# Patient Record
Sex: Female | Born: 1965
Health system: Southern US, Community
[De-identification: ages and names within clinical notes are randomized; demographics above are authoritative.]

## PROBLEM LIST (undated history)

## (undated) DIAGNOSIS — R51 Headache: Secondary | ICD-10-CM

## (undated) DIAGNOSIS — F329 Major depressive disorder, single episode, unspecified: Secondary | ICD-10-CM

## (undated) DIAGNOSIS — K219 Gastro-esophageal reflux disease without esophagitis: Secondary | ICD-10-CM

## (undated) DIAGNOSIS — R269 Unspecified abnormalities of gait and mobility: Secondary | ICD-10-CM

## (undated) DIAGNOSIS — Z833 Family history of diabetes mellitus: Secondary | ICD-10-CM

## (undated) DIAGNOSIS — M21161 Varus deformity, not elsewhere classified, right knee: Secondary | ICD-10-CM

## (undated) DIAGNOSIS — Z96653 Presence of artificial knee joint, bilateral: Secondary | ICD-10-CM

## (undated) DIAGNOSIS — F419 Anxiety disorder, unspecified: Secondary | ICD-10-CM

## (undated) DIAGNOSIS — E785 Hyperlipidemia, unspecified: Secondary | ICD-10-CM

## (undated) DIAGNOSIS — M1712 Unilateral primary osteoarthritis, left knee: Secondary | ICD-10-CM

## (undated) DIAGNOSIS — R112 Nausea with vomiting, unspecified: Secondary | ICD-10-CM

## (undated) DIAGNOSIS — M1711 Unilateral primary osteoarthritis, right knee: Secondary | ICD-10-CM

## (undated) DIAGNOSIS — Z9889 Other specified postprocedural states: Secondary | ICD-10-CM

## (undated) DIAGNOSIS — M21162 Varus deformity, not elsewhere classified, left knee: Secondary | ICD-10-CM

## (undated) DIAGNOSIS — M1990 Unspecified osteoarthritis, unspecified site: Secondary | ICD-10-CM

## (undated) DIAGNOSIS — M174 Other bilateral secondary osteoarthritis of knee: Secondary | ICD-10-CM

## (undated) DIAGNOSIS — K30 Functional dyspepsia: Secondary | ICD-10-CM

## (undated) DIAGNOSIS — D490 Neoplasm of unspecified behavior of digestive system: Secondary | ICD-10-CM

## (undated) DIAGNOSIS — R001 Bradycardia, unspecified: Secondary | ICD-10-CM

## (undated) HISTORY — DX: Presence of artificial knee joint, bilateral: Z96.653

## (undated) HISTORY — DX: Unspecified osteoarthritis, unspecified site: M19.90

## (undated) HISTORY — DX: Unilateral primary osteoarthritis, left knee: M17.12

## (undated) HISTORY — DX: Headache: R51

## (undated) HISTORY — DX: Family history of diabetes mellitus: Z83.3

## (undated) HISTORY — DX: Varus deformity, not elsewhere classified, right knee: M21.161

## (undated) HISTORY — DX: Gastro-esophageal reflux disease without esophagitis: K21.9

## (undated) HISTORY — DX: Varus deformity, not elsewhere classified, left knee: M21.162

## (undated) HISTORY — DX: Bradycardia, unspecified: R00.1

## (undated) HISTORY — DX: Unspecified abnormalities of gait and mobility: R26.9

## (undated) HISTORY — DX: Neoplasm of unspecified behavior of digestive system: D49.0

## (undated) HISTORY — DX: Unilateral primary osteoarthritis, right knee: M17.11

## (undated) HISTORY — DX: Hyperlipidemia, unspecified: E78.5

## (undated) HISTORY — DX: Major depressive disorder, single episode, unspecified: F32.9

## (undated) HISTORY — DX: Other bilateral secondary osteoarthritis of knee: M17.4

## (undated) HISTORY — PX: ABDOMINAL HYSTERECTOMY: SHX81

## (undated) HISTORY — PX: APPENDECTOMY: SHX54

---

## 1997-04-19 DIAGNOSIS — Z9889 Other specified postprocedural states: Secondary | ICD-10-CM

## 1997-04-19 DIAGNOSIS — R112 Nausea with vomiting, unspecified: Secondary | ICD-10-CM

## 1997-04-19 HISTORY — DX: Other specified postprocedural states: Z98.890

## 1997-04-19 HISTORY — DX: Other specified postprocedural states: R11.2

## 1997-11-06 ENCOUNTER — Inpatient Hospital Stay (HOSPITAL_COMMUNITY): Admission: AD | Admit: 1997-11-06 | Discharge: 1997-11-06 | Payer: Self-pay | Admitting: Obstetrics

## 1997-11-08 ENCOUNTER — Inpatient Hospital Stay (HOSPITAL_COMMUNITY): Admission: AD | Admit: 1997-11-08 | Discharge: 1997-11-08 | Payer: Self-pay | Admitting: Obstetrics & Gynecology

## 1997-11-18 ENCOUNTER — Inpatient Hospital Stay (HOSPITAL_COMMUNITY): Admission: AD | Admit: 1997-11-18 | Discharge: 1997-11-18 | Payer: Self-pay | Admitting: *Deleted

## 1997-11-27 ENCOUNTER — Inpatient Hospital Stay (HOSPITAL_COMMUNITY): Admission: AD | Admit: 1997-11-27 | Discharge: 1997-11-27 | Payer: Self-pay | Admitting: Obstetrics

## 1997-12-03 ENCOUNTER — Inpatient Hospital Stay (HOSPITAL_COMMUNITY): Admission: AD | Admit: 1997-12-03 | Discharge: 1997-12-03 | Payer: Self-pay | Admitting: *Deleted

## 1997-12-06 ENCOUNTER — Inpatient Hospital Stay (HOSPITAL_COMMUNITY): Admission: AD | Admit: 1997-12-06 | Discharge: 1997-12-06 | Payer: Self-pay | Admitting: Obstetrics

## 1997-12-19 ENCOUNTER — Other Ambulatory Visit: Admission: RE | Admit: 1997-12-19 | Discharge: 1997-12-19 | Payer: Self-pay | Admitting: Obstetrics

## 1997-12-19 ENCOUNTER — Encounter: Admission: RE | Admit: 1997-12-19 | Discharge: 1997-12-19 | Payer: Self-pay | Admitting: Obstetrics

## 1997-12-28 ENCOUNTER — Emergency Department (HOSPITAL_COMMUNITY): Admission: EM | Admit: 1997-12-28 | Discharge: 1997-12-28 | Payer: Self-pay | Admitting: Emergency Medicine

## 1999-04-20 HISTORY — PX: TOTAL ABDOMINAL HYSTERECTOMY W/ BILATERAL SALPINGOOPHORECTOMY: SHX83

## 1999-05-29 ENCOUNTER — Other Ambulatory Visit: Admission: RE | Admit: 1999-05-29 | Discharge: 1999-05-29 | Payer: Self-pay | Admitting: Gynecology

## 1999-06-03 ENCOUNTER — Encounter (INDEPENDENT_AMBULATORY_CARE_PROVIDER_SITE_OTHER): Payer: Self-pay | Admitting: Specialist

## 1999-06-03 ENCOUNTER — Inpatient Hospital Stay (HOSPITAL_COMMUNITY): Admission: RE | Admit: 1999-06-03 | Discharge: 1999-06-05 | Payer: Self-pay | Admitting: Gynecology

## 2000-05-31 ENCOUNTER — Other Ambulatory Visit: Admission: RE | Admit: 2000-05-31 | Discharge: 2000-05-31 | Payer: Self-pay | Admitting: Gynecology

## 2001-06-06 ENCOUNTER — Other Ambulatory Visit: Admission: RE | Admit: 2001-06-06 | Discharge: 2001-06-06 | Payer: Self-pay | Admitting: Gynecology

## 2002-06-07 ENCOUNTER — Other Ambulatory Visit: Admission: RE | Admit: 2002-06-07 | Discharge: 2002-06-07 | Payer: Self-pay | Admitting: Gynecology

## 2003-06-10 ENCOUNTER — Other Ambulatory Visit: Admission: RE | Admit: 2003-06-10 | Discharge: 2003-06-10 | Payer: Self-pay | Admitting: Gynecology

## 2004-01-16 ENCOUNTER — Encounter: Admission: RE | Admit: 2004-01-16 | Discharge: 2004-01-16 | Payer: Self-pay | Admitting: Gynecology

## 2004-06-11 ENCOUNTER — Other Ambulatory Visit: Admission: RE | Admit: 2004-06-11 | Discharge: 2004-06-11 | Payer: Self-pay | Admitting: Gynecology

## 2004-10-03 ENCOUNTER — Emergency Department (HOSPITAL_COMMUNITY): Admission: EM | Admit: 2004-10-03 | Discharge: 2004-10-03 | Payer: Self-pay | Admitting: Emergency Medicine

## 2005-02-02 ENCOUNTER — Encounter: Admission: RE | Admit: 2005-02-02 | Discharge: 2005-02-02 | Payer: Self-pay | Admitting: Gynecology

## 2005-05-25 ENCOUNTER — Other Ambulatory Visit: Admission: RE | Admit: 2005-05-25 | Discharge: 2005-05-25 | Payer: Self-pay | Admitting: Gynecology

## 2006-02-10 ENCOUNTER — Encounter: Admission: RE | Admit: 2006-02-10 | Discharge: 2006-02-10 | Payer: Self-pay | Admitting: Gynecology

## 2006-05-20 ENCOUNTER — Other Ambulatory Visit: Admission: RE | Admit: 2006-05-20 | Discharge: 2006-05-20 | Payer: Self-pay | Admitting: Gynecology

## 2006-05-24 ENCOUNTER — Emergency Department (HOSPITAL_COMMUNITY): Admission: EM | Admit: 2006-05-24 | Discharge: 2006-05-24 | Payer: Self-pay | Admitting: Emergency Medicine

## 2006-05-25 ENCOUNTER — Emergency Department (HOSPITAL_COMMUNITY): Admission: EM | Admit: 2006-05-25 | Discharge: 2006-05-26 | Payer: Self-pay | Admitting: Emergency Medicine

## 2006-06-26 ENCOUNTER — Emergency Department (HOSPITAL_COMMUNITY): Admission: EM | Admit: 2006-06-26 | Discharge: 2006-06-26 | Payer: Self-pay | Admitting: Emergency Medicine

## 2006-07-26 ENCOUNTER — Ambulatory Visit: Payer: Self-pay | Admitting: Internal Medicine

## 2006-07-27 ENCOUNTER — Ambulatory Visit: Payer: Self-pay | Admitting: Internal Medicine

## 2006-09-23 ENCOUNTER — Emergency Department (HOSPITAL_COMMUNITY): Admission: EM | Admit: 2006-09-23 | Discharge: 2006-09-23 | Payer: Self-pay | Admitting: Family Medicine

## 2006-10-05 ENCOUNTER — Ambulatory Visit: Payer: Self-pay | Admitting: Internal Medicine

## 2006-10-19 ENCOUNTER — Emergency Department (HOSPITAL_COMMUNITY): Admission: EM | Admit: 2006-10-19 | Discharge: 2006-10-19 | Payer: Self-pay | Admitting: Emergency Medicine

## 2007-02-17 ENCOUNTER — Encounter: Admission: RE | Admit: 2007-02-17 | Discharge: 2007-02-17 | Payer: Self-pay | Admitting: Gynecology

## 2007-05-26 ENCOUNTER — Emergency Department (HOSPITAL_COMMUNITY): Admission: EM | Admit: 2007-05-26 | Discharge: 2007-05-26 | Payer: Self-pay | Admitting: Emergency Medicine

## 2007-08-14 DIAGNOSIS — F329 Major depressive disorder, single episode, unspecified: Secondary | ICD-10-CM

## 2007-08-14 DIAGNOSIS — F3289 Other specified depressive episodes: Secondary | ICD-10-CM

## 2007-08-14 DIAGNOSIS — K219 Gastro-esophageal reflux disease without esophagitis: Secondary | ICD-10-CM

## 2007-08-14 DIAGNOSIS — M1990 Unspecified osteoarthritis, unspecified site: Secondary | ICD-10-CM | POA: Insufficient documentation

## 2007-08-14 DIAGNOSIS — R51 Headache: Secondary | ICD-10-CM

## 2007-08-14 DIAGNOSIS — R519 Headache, unspecified: Secondary | ICD-10-CM | POA: Insufficient documentation

## 2007-08-14 DIAGNOSIS — E785 Hyperlipidemia, unspecified: Secondary | ICD-10-CM

## 2007-08-14 HISTORY — DX: Other specified depressive episodes: F32.89

## 2007-08-14 HISTORY — DX: Headache: R51

## 2007-08-14 HISTORY — DX: Unspecified osteoarthritis, unspecified site: M19.90

## 2007-08-14 HISTORY — DX: Gastro-esophageal reflux disease without esophagitis: K21.9

## 2007-08-14 HISTORY — DX: Major depressive disorder, single episode, unspecified: F32.9

## 2007-08-14 HISTORY — DX: Hyperlipidemia, unspecified: E78.5

## 2008-04-29 ENCOUNTER — Encounter: Admission: RE | Admit: 2008-04-29 | Discharge: 2008-04-29 | Payer: Self-pay | Admitting: Gynecology

## 2009-05-06 ENCOUNTER — Encounter: Admission: RE | Admit: 2009-05-06 | Discharge: 2009-05-06 | Payer: Self-pay | Admitting: Gynecology

## 2010-05-11 ENCOUNTER — Encounter
Admission: RE | Admit: 2010-05-11 | Discharge: 2010-05-11 | Payer: Self-pay | Source: Home / Self Care | Attending: Gynecology | Admitting: Gynecology

## 2010-09-01 NOTE — Assessment & Plan Note (Signed)
[facility]                         GASTROENTEROLOGY OFFICE NOTE   Tina Johnson, Tina Johnson                      MRN:          [id number]  DATE:10/05/2006                            DOB:          [birth year]    HISTORY:  The patient presents today for followup.  She is a 45 year old  who was evaluated on July 26, 2006, for reflux symptoms, abdominal  complaints, and Hemoccult-positive stool.  See that dictation for  details.  She subsequently underwent colonoscopy and upper endoscopy on  July 27, 2006.  Colonoscopy including intubation of the terminal ilium  was normal.  Upper endoscopy was normal except for the presence of a  small hiatal hernia.  She was treated with Prevacid and asked to follow  up at this time.  Since that time, the patient was prescribed Xanax and  fluoxetine by Dr. Mikeal Hawthorne.  She states that she is currently feeling well  with no symptoms.  No further problems with abdominal complaints or  reflux complaints.  She is accompanied by her friend Corrie Dandy who, again,  serves as an excellent interpreter.   PHYSICAL EXAMINATION:  GENERAL:  This is a well-appearing female in no  acute distress.  VITAL SIGNS:  Blood pressure 110/62.  Heart rate is 52 and regular.  Weight 109 pounds (decreased 4 pounds).  ABDOMEN:  Soft without tenderness, mass, or hernia.   IMPRESSION:  1. Gastroesophageal reflux disease.  Symptoms resolved on Prevacid.  2. Hemoccult-positive stool with negative GI workup as discussed.   RECOMMENDATIONS:  1. Continue Prevacid.  2. Reflux precautions.  3. Resume general medical care with Dr. Mikeal Hawthorne and gynecologic care      with Dr. Nicholas Lose.  GI follow up p.r.n.     Wilhemina Bonito. Marina Goodell, MD  Electronically Signed    JNP/MedQ  DD: 10/05/2006  DT: 10/05/2006  Job #: [id number]   cc:   Gretta Cool, M.D.  Lonia Blood, M.D.

## 2010-09-04 NOTE — Assessment & Plan Note (Signed)
[facility]                                 ON-CALL NOTE   Tina Johnson, Tina Johnson                      MRN:          [id number]  DATE:07/28/2006                            DOB:          07/10/1965    I was called by this patient's son, Elita Quick at approximately 8:45 on  07/28/06. The patient had a colonoscopy without any removal of polyps per  their history two days ago by Dr. Yancey Flemings. This afternoon, she  developed some mild abdominal discomfort in the infraumbilical area.  This is not associated with any bleeding, fever, nausea, or vomiting.  She has not yet had a bowel movement since her colonoscopy. She has been  passing flatus.   I advised the son to tell his mom to monitor the situation, if things  worsened, or if there is a fever, bleeding, or vomiting, I wanted him to  call me back immediately for further advice. I expect this to pass once  she starts moving her bowels.     Iva Boop, MD,FACG  Electronically Signed    CEG/MedQ  DD: 07/28/2006  DT: 07/29/2006  Job #: [id number]   cc:   Wilhemina Bonito. Marina Goodell, MD

## 2010-09-04 NOTE — Assessment & Plan Note (Signed)
[facility]                         GASTROENTEROLOGY OFFICE NOTE   MAKAIAH, TERWILLIGER                      MRN:          [id number]  DATE:07/26/2006                            DOB:          09/18/1965    REFERRING PHYSICIAN:  Gretta Cool, M.D.   REASON FOR CONSULTATION:  Reflux symptoms, abdominal complaints and  hemoccult positive stool.   HISTORY:  This is a 45 year old [place] female who is referred through  the courtesy of Dr. Nicholas Lose regarding the above listed issues. 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  Thereafter, developed heartburn and chest discomfort. Her problem  persisted. She has apparently been seen in the emergency room on several  occasions. She was initially treated with Prilosec without improvement.  Subsequently treated with Protonix, which helped significantly. She does  have some nausea. Her chest pain is controlled. She thinks the Protonix  has resulted in some loose stools. She has had 10-15 pound weight loss  over the past several months. She denies vomiting, melena or  hematochezia. The patient herself speaks no [place]. Her friend, Corrie Dandy,  serves as an excellent interpreter.   PAST MEDICAL HISTORY:  1. Hyperlipidemia.  2. Depression.  3. Chronic headaches.  4. Osteoarthritis.   PAST SURGICAL HISTORY:  Hysterectomy and oophorectomy.   ALLERGIES:  No known drug allergies.   CURRENT MEDICATIONS:  1. Testosterone cream.  2. Vivelle dot.  3. Estrace cream.  4. Protonix 40 mg daily.  5. Mylanta p.r.n.   FAMILY HISTORY:  No family history of gastrointestinal malignancy.   SOCIAL HISTORY:  The patient is married with two sons. She lives with  her husband, Lesly Rubenstein. She is a Futures trader. She does not smoke or  use alcohol.   REVIEW OF SYSTEMS:  Per Diagnostic Evaluation form.   PHYSICAL EXAMINATION:  Somewhat anxious, but otherwise pleasant and well-  appearing female in no acute distress. Blood pressure is 124/78, heart  rate 68 and regular. Weight is 113 pounds. She is 4 feet 11 inches in  height.  HEENT: Sclerae anicteric. Conjunctivae are pink. Oral mucosa intact. No  adenopathy.  LUNGS:  Are clear.  HEART: Is regular.  ABDOMEN: Soft without tenderness, mass or hernia. Good bowel sounds are  heard.  EXTREMITIES: Are without edema.   LABORATORY FINDINGS:  CBC, comprehensive metabolic panel and urinalysis  obtained June 26, 2006, were unremarkable. She also had normal blood  work in early February. She did have Hemoccult positive stool upon  testing at Dr. Johnn Hai office.   IMPRESSION:  1. Gastroesophageal reflux disease.  2. Hemoccult positive stool.  3. Weight loss.   RECOMMENDATIONS:  1. Continue daily proton pump inhibitor therapy.  2. Schedule colonoscopy and upper endoscopy to evaluate abdominal      complaints and Hemoccult positive stool as well as weight loss. The      nature of both procedures as well as the risks, benefits and      alternatives were discussed in detail. She understood  and agreed to      proceed.  3. Ongoing general and gynecologic care with Dr. Nicholas Lose.     Wilhemina Bonito. Marina Goodell, MD  Electronically Signed    JNP/MedQ  DD: 07/26/2006  DT: 07/26/2006  Job #: [id number]   cc:   Gretta Cool, M.D.

## 2011-01-08 LAB — RAPID STREP SCREEN (MED CTR MEBANE ONLY): Streptococcus, Group A Screen (Direct): NEGATIVE

## 2011-02-04 LAB — DIFFERENTIAL
Basophils Absolute: 0
Basophils Relative: 0
Eosinophils Absolute: 0
Eosinophils Relative: 0
Lymphocytes Relative: 17
Lymphs Abs: 1.1
Monocytes Absolute: 0.3
Monocytes Relative: 4
Neutro Abs: 5.1
Neutrophils Relative %: 78 — ABNORMAL HIGH

## 2011-02-04 LAB — CBC
HCT: 40.5
Hemoglobin: 14
MCHC: 34.5
MCV: 86.8
Platelets: 246
RBC: 4.66
RDW: 13.4
WBC: 6.5

## 2011-02-04 LAB — COMPREHENSIVE METABOLIC PANEL
ALT: 18
AST: 21
Albumin: 3.7
Alkaline Phosphatase: 67
BUN: 12
CO2: 28
Calcium: 9.1
Chloride: 105
Creatinine, Ser: 0.53
GFR calc Af Amer: >60
GFR calc non Af Amer: >60
Glucose, Bld: 107 — ABNORMAL HIGH
Potassium: 4.1
Sodium: 139
Total Bilirubin: 0.9
Total Protein: 6.9

## 2011-02-04 LAB — POCT CARDIAC MARKERS
CKMB, poc: 1.1
Myoglobin, poc: 47.6
Operator id: 2725
Troponin i, poc: <0.05

## 2011-04-26 ENCOUNTER — Other Ambulatory Visit: Payer: Self-pay | Admitting: Gynecology

## 2011-04-26 DIAGNOSIS — Z1231 Encounter for screening mammogram for malignant neoplasm of breast: Secondary | ICD-10-CM

## 2011-05-17 ENCOUNTER — Ambulatory Visit
Admission: RE | Admit: 2011-05-17 | Discharge: 2011-05-17 | Disposition: A | Discharge status: Home / Self Care | Payer: Self-pay | Source: Ambulatory Visit | Attending: Gynecology | Admitting: Gynecology

## 2011-05-17 DIAGNOSIS — Z1231 Encounter for screening mammogram for malignant neoplasm of breast: Secondary | ICD-10-CM

## 2012-04-24 ENCOUNTER — Other Ambulatory Visit: Payer: Self-pay | Admitting: Gynecology

## 2012-04-24 DIAGNOSIS — Z1231 Encounter for screening mammogram for malignant neoplasm of breast: Secondary | ICD-10-CM

## 2012-05-22 ENCOUNTER — Ambulatory Visit
Admission: RE | Admit: 2012-05-22 | Discharge: 2012-05-22 | Disposition: A | Discharge status: Home / Self Care | Payer: No Typology Code available for payment source | Source: Ambulatory Visit | Attending: Gynecology | Admitting: Gynecology

## 2012-05-22 DIAGNOSIS — Z1231 Encounter for screening mammogram for malignant neoplasm of breast: Secondary | ICD-10-CM

## 2013-06-06 ENCOUNTER — Other Ambulatory Visit: Payer: Self-pay

## 2013-06-06 DIAGNOSIS — Z1231 Encounter for screening mammogram for malignant neoplasm of breast: Secondary | ICD-10-CM

## 2013-06-21 ENCOUNTER — Ambulatory Visit
Admission: RE | Admit: 2013-06-21 | Discharge: 2013-06-21 | Disposition: A | Discharge status: Home / Self Care | Payer: Self-pay | Source: Ambulatory Visit

## 2013-06-21 DIAGNOSIS — Z1231 Encounter for screening mammogram for malignant neoplasm of breast: Secondary | ICD-10-CM

## 2014-01-23 ENCOUNTER — Ambulatory Visit: Payer: Self-pay | Attending: Internal Medicine | Admitting: Internal Medicine

## 2014-01-23 ENCOUNTER — Encounter: Payer: Self-pay | Admitting: Internal Medicine

## 2014-01-23 ENCOUNTER — Ambulatory Visit (HOSPITAL_COMMUNITY)
Admission: RE | Admit: 2014-01-23 | Discharge: 2014-01-23 | Disposition: A | Discharge status: Home / Self Care | Payer: No Typology Code available for payment source | Source: Ambulatory Visit | Attending: Internal Medicine | Admitting: Internal Medicine

## 2014-01-23 VITALS — BP 132/78 | HR 59 | Temp 97.8°F | Resp 17 | Wt 116.2 lb

## 2014-01-23 DIAGNOSIS — M25561 Pain in right knee: Secondary | ICD-10-CM | POA: Insufficient documentation

## 2014-01-23 DIAGNOSIS — M79604 Pain in right leg: Secondary | ICD-10-CM | POA: Insufficient documentation

## 2014-01-23 DIAGNOSIS — Z23 Encounter for immunization: Secondary | ICD-10-CM

## 2014-01-23 DIAGNOSIS — M1711 Unilateral primary osteoarthritis, right knee: Secondary | ICD-10-CM

## 2014-01-23 DIAGNOSIS — Z833 Family history of diabetes mellitus: Secondary | ICD-10-CM | POA: Insufficient documentation

## 2014-01-23 DIAGNOSIS — M25562 Pain in left knee: Secondary | ICD-10-CM | POA: Insufficient documentation

## 2014-01-23 DIAGNOSIS — Z139 Encounter for screening, unspecified: Secondary | ICD-10-CM

## 2014-01-23 DIAGNOSIS — E785 Hyperlipidemia, unspecified: Secondary | ICD-10-CM | POA: Insufficient documentation

## 2014-01-23 HISTORY — DX: Unilateral primary osteoarthritis, right knee: M17.11

## 2014-01-23 HISTORY — DX: Family history of diabetes mellitus: Z83.3

## 2014-01-23 MED ORDER — UNABLE TO FIND: Status: DC

## 2014-01-23 MED ORDER — NAPROXEN 500 MG PO TABS: 500.0000 mg | ORAL_TABLET | Freq: Two times a day (BID) | ORAL | Status: DC

## 2014-01-23 NOTE — Progress Notes (Signed)
Patient here to establish care Complains of right leg pain and bilateral knee pain

## 2014-01-23 NOTE — Progress Notes (Signed)
Patient Demographics  Tina Johnson, is a 48 y.o. female  JSH:[id number]  IFO:[id number]  DOB - 12-31-1965  CC:  Chief Complaint  Patient presents with  . Establish Care       HPI: Tina Johnson is a 49 y.o. female here today to establish medical care. Patient has history of hyperlipidemia, GERD, hysterectomy with oophorectomy patient is currently following up with her gynecologist and is taking estradiol and Premarin, also noticed prescription for testosterone 4% as per patient she was prescribed in the past but does not use currently. Patient is complaining of right knee pain on and off for several months denies any fall or trauma. Patient has No headache, No chest pain, No abdominal pain - No Nausea, No new weakness tingling or numbness, No Cough - SOB.  No Known Allergies No past medical history on file. No current outpatient prescriptions on file prior to visit.   No current facility-administered medications on file prior to visit.   Family History  Problem Relation Age of Onset  . Diabetes Sister   . Diabetes Brother    History   Social History  . Marital Status: Married    Spouse Name: N/A    Number of Children: N/A  . Years of Education: N/A   Occupational History  . Not on file.   Social History Main Topics  . Smoking status: Never Smoker   . Smokeless tobacco: Not on file  . Alcohol Use: No  . Drug Use: Not on file  . Sexual Activity: Not on file   Other Topics Concern  . Not on file   Social History Narrative  . No narrative on file    Review of Systems: Constitutional: Negative for fever, chills, diaphoresis, activity change, appetite change and fatigue. HENT: Negative for ear pain, nosebleeds, congestion, facial swelling, rhinorrhea, neck pain, neck stiffness and ear discharge.  Eyes: Negative for pain, discharge, redness, itching and visual disturbance. Respiratory: Negative for cough, choking, chest tightness, shortness  of breath, wheezing and stridor.  Cardiovascular: Negative for chest pain, palpitations and leg swelling. Gastrointestinal: Negative for abdominal distention. Genitourinary: Negative for dysuria, urgency, frequency, hematuria, flank pain, decreased urine volume, difficulty urinating and dyspareunia.  Musculoskeletal: Negative for back pain, joint swelling, arthralgia and gait problem. Neurological: Negative for dizziness, tremors, seizures, syncope, facial asymmetry, speech difficulty, weakness, light-headedness, numbness and headaches.  Hematological: Negative for adenopathy. Does not bruise/bleed easily. Psychiatric/Behavioral: Negative for hallucinations, behavioral problems, confusion, dysphoric mood, decreased concentration and agitation.    Objective:   Filed Vitals:   01/23/14 1522  BP: 132/78  Pulse: 59  Temp: 97.8 F (36.6 C)  Resp: 17    Physical Exam: Constitutional: Patient appears well-developed and well-nourished. No distress. HENT: Normocephalic, atraumatic, External right and left ear normal. Oropharynx is clear and moist.  Eyes: Conjunctivae and EOM are normal. PERRLA, no scleral icterus. Neck: Normal ROM. Neck supple. No JVD. No tracheal deviation. No thyromegaly. CVS: RRR, S1/S2 +, no murmurs, no gallops, no carotid bruit.  Pulmonary: Effort and breath sounds normal, no stridor, rhonchi, wheezes, rales.  Abdominal: Soft. BS +, no distension, tenderness, rebound or guarding.  Musculoskeletal: Normal range of motion. No edema and right knee minimal  Tenderness, crepitation+  Neuro: Alert. Normal reflexes, muscle tone coordination. No cranial nerve deficit. Skin: Skin is warm and dry. No rash noted. Not diaphoretic. No erythema. No pallor. Psychiatric: Normal mood and affect. Behavior, judgment, thought content normal.  Lab Results  Component Value Date  WBC 6.5 09/23/2006   HGB 14.0 09/23/2006   HCT 40.5 09/23/2006   MCV 86.8 09/23/2006   PLT 246 09/23/2006   Lab  Results  Component Value Date   CREATININE 0.53 09/23/2006   BUN 12 09/23/2006   NA 139 09/23/2006   K 4.1 09/23/2006   CL 105 09/23/2006   CO2 28 09/23/2006    No results found for this basename: HGBA1C   Lipid Panel  No results found for this basename: chol,  trig,  hdl,  cholhdl,  vldl,  ldlcalc       Assessment and plan:   1. Hyperlipidemia And had a prescription for Zocor but as per patient she is not taking currently. - Lipid panel; Future  2. Right knee pain  - DG Knee Complete 4 Views Right; Future - naproxen (NAPROSYN) 500 MG tablet; Take 1 tablet (500 mg total) by mouth 2 (two) times daily with a meal.  Dispense: 30 tablet; Refill: 2  3. Needs flu shot Future given today  4. Family history of diabetes mellitus (DM) Will check HbA1c  5. Screening Ordered baseline fasting blood work. - CBC with Differential; Future - COMPLETE METABOLIC PANEL WITH GFR; Future - TSH; Future - Vit D  25 hydroxy (rtn osteoporosis monitoring); Future      Health Maintenance -Colonoscopy: uptodate  -Pap Smear: uptodate  -Mammogram: uptopdate  -Vaccinations:    -Influenza shot today    Return in about 3 months (around 04/25/2014) for hyperipidemia.   Lorayne Marek, MD

## 2014-01-29 ENCOUNTER — Ambulatory Visit: Payer: Self-pay | Attending: Internal Medicine

## 2014-01-29 DIAGNOSIS — Z139 Encounter for screening, unspecified: Secondary | ICD-10-CM

## 2014-01-29 DIAGNOSIS — M1990 Unspecified osteoarthritis, unspecified site: Secondary | ICD-10-CM

## 2014-01-29 DIAGNOSIS — E785 Hyperlipidemia, unspecified: Secondary | ICD-10-CM

## 2014-01-29 DIAGNOSIS — Z833 Family history of diabetes mellitus: Secondary | ICD-10-CM

## 2014-01-29 LAB — COMPLETE METABOLIC PANEL WITH GFR
ALT: 18 U/L (ref 0–35)
AST: 19 U/L (ref 0–37)
Albumin: 3.8 g/dL (ref 3.5–5.2)
Alkaline Phosphatase: 55 U/L (ref 39–117)
BUN: 19 mg/dL (ref 6–23)
CO2: 23 meq/L (ref 19–32)
CREATININE: 0.55 mg/dL (ref 0.50–1.10)
Calcium: 9.1 mg/dL (ref 8.4–10.5)
Chloride: 105 mEq/L (ref 96–112)
GFR, Est African American: >89 mL/min
GFR, Est Non African American: >89 mL/min
Glucose, Bld: 91 mg/dL (ref 70–99)
Potassium: 4.2 mEq/L (ref 3.5–5.3)
SODIUM: 139 meq/L (ref 135–145)
TOTAL PROTEIN: 6.5 g/dL (ref 6.0–8.3)
Total Bilirubin: 0.5 mg/dL (ref 0.2–1.2)

## 2014-01-29 LAB — CBC WITH DIFFERENTIAL/PLATELET
Basophils Absolute: 0 10*3/uL (ref 0.0–0.1)
Basophils Relative: 0 % (ref 0–1)
Eosinophils Absolute: 0.1 10*3/uL (ref 0.0–0.7)
Eosinophils Relative: 2 % (ref 0–5)
HCT: 38.5 % (ref 36.0–46.0)
Hemoglobin: 13.1 g/dL (ref 12.0–15.0)
LYMPHS ABS: 2.2 10*3/uL (ref 0.7–4.0)
Lymphocytes Relative: 36 % (ref 12–46)
MCH: 30.7 pg (ref 26.0–34.0)
MCHC: 34 g/dL (ref 30.0–36.0)
MCV: 90.2 fL (ref 78.0–100.0)
Monocytes Absolute: 0.5 10*3/uL (ref 0.1–1.0)
Monocytes Relative: 8 % (ref 3–12)
NEUTROS PCT: 54 % (ref 43–77)
Neutro Abs: 3.3 10*3/uL (ref 1.7–7.7)
Platelets: 252 10*3/uL (ref 150–400)
RBC: 4.27 MIL/uL (ref 3.87–5.11)
RDW: 14.2 % (ref 11.5–15.5)
WBC: 6.1 10*3/uL (ref 4.0–10.5)

## 2014-01-29 LAB — HEMOGLOBIN A1C
HEMOGLOBIN A1C: 5.3 % (ref <5.7)
Mean Plasma Glucose: 105 mg/dL (ref <117)

## 2014-01-29 LAB — LIPID PANEL
CHOL/HDL RATIO: 3.3 ratio
Cholesterol: 144 mg/dL (ref 0–200)
HDL: 43 mg/dL (ref >39)
LDL CALC: 84 mg/dL (ref 0–99)
Triglycerides: 86 mg/dL (ref <150)
VLDL: 17 mg/dL (ref 0–40)

## 2014-01-29 LAB — TSH: TSH: 4.001 u[IU]/mL (ref 0.350–4.500)

## 2014-01-29 NOTE — Progress Notes (Unsigned)
Patient here for fasting bloodwork

## 2014-01-30 LAB — VITAMIN D 25 HYDROXY (VIT D DEFICIENCY, FRACTURES): Vit D, 25-Hydroxy: 56 ng/mL (ref 30–89)

## 2014-02-04 ENCOUNTER — Telehealth: Payer: Self-pay | Admitting: *Deleted

## 2014-02-04 NOTE — Telephone Encounter (Signed)
Pt aware of lab results 

## 2014-02-04 NOTE — Telephone Encounter (Signed)
Message copied by Betti Cruz on Mon Feb 04, 2014  2:13 PM ------      Message from: Lorayne Marek      Created: Mon Feb 04, 2014  2:08 PM       Call and let the Patient know that blood work is normal.       ------

## 2014-02-14 ENCOUNTER — Ambulatory Visit (INDEPENDENT_AMBULATORY_CARE_PROVIDER_SITE_OTHER): Payer: Self-pay | Admitting: Sports Medicine

## 2014-02-14 ENCOUNTER — Encounter: Payer: Self-pay | Admitting: Sports Medicine

## 2014-02-14 VITALS — BP 147/78 | Ht <= 58 in | Wt 114.0 lb

## 2014-02-14 DIAGNOSIS — M1711 Unilateral primary osteoarthritis, right knee: Secondary | ICD-10-CM

## 2014-02-14 MED ORDER — METHYLPREDNISOLONE ACETATE 40 MG/ML IJ SUSP
40.0000 mg | Freq: Once | INTRAMUSCULAR | Status: AC
Start: 2014-02-14 — End: 2014-02-14
  Administered 2014-02-14: 40 mg via INTRA_ARTICULAR

## 2014-02-14 NOTE — Progress Notes (Addendum)
  Tina Johnson - 48 y.o. female MRN [id number]  Date of birth: 1966/01/13  SUBJECTIVE:  Including CC & ROS.  Patient is a 48 year old female who presents today complaining of right knee pain that has been on and off for 2 years but worse in the last 6 months. She describes intermittent sharp pain with a constant dull ache, mild intermittent swelling, and giving out. Denies any numbness or tingling in her lower extremity. Reports activity of walking and standing for long periods of time exacerbate her symptoms. She also points out that she has bowing in her legs that she feels is contributing to her knee pain. Patient has been on naproxen 500 mg twice a day for pain control with little relief.   ROS: Review of systems otherwise negative except for information present in HPI  HISTORY: Past Medical, Surgical, Social, and Family History Reviewed & Updated per EMR. Pertinent Historical Findings include: Hyperlipidemia Nonsmoker  DATA REVIEWED: Personally reviewed patient's 4 view right knee x-rays from earlier this month that showed moderate tricompartment osteoarthritis with moderate to severe medial jointline narrowing, and severe arthritis to the patellofemoral joint  PHYSICAL EXAM:  VS: BP:147/78 mmHg  HR: bpm  TEMP: ( )  RESP:   HT:4' 6.5" (138.4 cm)   WT:114 lb (51.71 kg)  BMI:27 KNEE EXAM:  General: well nourished Skin of LE: warm; dry, no rashes, lesions, ecchymosis or erythema. Vascular: Dorsal pedal pulses 2+ bilaterally Neurologically: Sensation to light touch lower extremities equal and intact bilaterally.  Observation:  no previous surgical scars, patella in place, moderate quad muscle atrophy Bowing of the tibias causing significant valgus stress Palpation:  Right knee - Mild effusion which is likely chronic, medial and lateral jointline narrowing and tenderness. Range of motion:  Full ROM at the hip. Range of motion significantly limited bilaterally between 0 of  extension and only about 70 of flexion Ligamentous testing: ligamentous stablity of ACL, MCL, LCL, and PCL. With Lachman's test, anterior drawer, posterior drawer, valgus and varus stress test Negative patella apprehension test Meniscal evaluation: Equivocal McMurray's test, Normal gait   ASSESSMENT & PLAN: See problem based charting & AVS for pt instructions.  Injection procedure: Consent obtained and verified. Sterile betadine prep. Furthur cleansed with alcohol. Topical analgesic spray: Ethyl chloride. Injection Indication: Right knee OA Approached in typical fashion with: lateral  Completed without difficulty Meds: 40mg  DepoMedrol and 3 cc lidocaine Needle: 22g 1.5in Aftercare instructions and Red flags advised. Advised to call if fevers/chills, erythema, induration, drainage, or persistent bleeding.

## 2014-02-14 NOTE — Assessment & Plan Note (Signed)
Based on patient's history, exam with lack of range of motion/joint line tenderness, and x-rays that showed tricompartment arthritis suspect majority of her pain and stiffness secondary to primary OA  Recommendations: -Advised patient that we can treat her primary arthritis with continuing oral medications versus a intra-articular steroid injection. Patient was agreeable with the injection. Thighs her we can do these every 3-6 months to maintain control of her pain. I do not suspect this will improve much of her stiffness and lack of range of motion -Given patient's severe knee valgus with bowing of the tibia I adjusted her feet with lateral wedges in both sports insoles for her shoes -Provided patient with advice on certain activities that she can tolerate in the gym including elliptical, bike, and walking in the pool. Avoiding deep squats or lunges Patient will follow-up as needed for further management based on her clinical response to steroid injection and modified occasions and activity

## 2014-02-14 NOTE — Progress Notes (Signed)
Alis herrera is the interpretor for today

## 2014-03-07 ENCOUNTER — Encounter: Payer: Self-pay | Admitting: Sports Medicine

## 2014-03-07 ENCOUNTER — Ambulatory Visit (INDEPENDENT_AMBULATORY_CARE_PROVIDER_SITE_OTHER): Payer: Self-pay | Admitting: Sports Medicine

## 2014-03-07 VITALS — BP 129/77 | HR 59 | Ht <= 58 in | Wt 115.0 lb

## 2014-03-07 DIAGNOSIS — M21162 Varus deformity, not elsewhere classified, left knee: Secondary | ICD-10-CM

## 2014-03-07 DIAGNOSIS — M1712 Unilateral primary osteoarthritis, left knee: Secondary | ICD-10-CM

## 2014-03-07 DIAGNOSIS — M21161 Varus deformity, not elsewhere classified, right knee: Secondary | ICD-10-CM

## 2014-03-07 HISTORY — DX: Varus deformity, not elsewhere classified, right knee: M21.161

## 2014-03-07 HISTORY — DX: Varus deformity, not elsewhere classified, left knee: M21.162

## 2014-03-07 HISTORY — DX: Unilateral primary osteoarthritis, left knee: M17.12

## 2014-03-07 MED ORDER — METHYLPREDNISOLONE ACETATE 40 MG/ML IJ SUSP
40.0000 mg | Freq: Once | INTRAMUSCULAR | Status: AC
Start: 2014-03-07 — End: 2014-03-07
  Administered 2014-03-07: 40 mg via INTRA_ARTICULAR

## 2014-03-07 NOTE — Assessment & Plan Note (Addendum)
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  Recommendations: -Advised patient that we can treat her primary arthritis with continuing oral medications versus a intra-articular steroid injection. Patient was agreeable with the injection. Thighs her we can do these every 3-6 months to maintain control of her pain. 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 Patient will follow-up in 2 weeks for custom orthotics -Provided patient with advice on certain activities that she can tolerate in the gym including elliptical, bike, and walking in the pool. Avoiding deep squats or lunges

## 2014-03-07 NOTE — Progress Notes (Signed)
Tina Johnson - 48 y.o. female MRN [id number]  Date of birth: Sep 18, 1965  SUBJECTIVE:  Including CC & ROS.  Patient is a 48 year old female who presents today complaining of bilateral knee pain.  Right knee pain: Patient was seen approximately 3 weeks ago were right knee pain. 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 We discussed options for treatment including oral medications, intra-articular steroid injection and bracing. Patient was interested in the injection reports that she did have clinical improvement from this intervention. She's been working on strengthening at [employer] which is also helped. Also at her last visit I placed the patient in a sports insoles with a medial wedge to correct some of the supination and her ankle related to the significant varus stress in her knees which is also helped some.  Left knee pain: Patient reports that she also has left knee pain that is similar to the right with intermittent sharp pains but mostly a dull ache. Intermittent mild swelling as well. Sometimes giving way but never locking or catching. Denies any numbness or tingling. She continues to take naproxen 500 mg twice a day for pain relief for both knees which is helpful. Has been working on strengthening at [employer]. Patient is interested in possibly getting a steroid injection in her left knee as well. She is also curious of what she can do to help further correct the alignment in her knees.   ROS: Review of systems otherwise negative except for information present in HPI  HISTORY: Past Medical, Surgical, Social, and Family History Reviewed & Updated per EMR. Pertinent Historical Findings include: Hyperlipidemia Nonsmoker  DATA REVIEWED: Personally reviewed patient's 4 view right knee x-rays from earlier this month that showed moderate tricompartment osteoarthritis with moderate to severe medial jointline narrowing,  and severe arthritis to the patellofemoral joint  PHYSICAL EXAM:  VS: BP:129/77 mmHg  HR:(!) 59bpm  TEMP: ( )  RESP:   HT:4\' 6"  (137.2 cm)   WT:115 lb (52.164 kg)  BMI:27.8 Bilateral KNEE EXAM:  General: well nourished Skin of LE: warm; dry, no rashes, lesions, ecchymosis or erythema. Vascular: Dorsal pedal pulses 2+ bilaterally Neurologically: Sensation to light touch lower extremities equal and intact bilaterally.  Observation:  no previous surgical scars, patella in place, moderate quad muscle atrophy Bowing of the tibias causing significant valgus stress Palpation:  Right knee - Mild effusion which is likely chronic, medial and lateral jointline narrowing and tenderness. Left knee - Mild effusion which is likely chronic, medial > lateral jointline narrowing and tenderness. Range of motion:  Full ROM at the hip. Range of motion significantly limited bilaterally between 0 of extension and only about 70 of flexion Ligamentous testing: ligamentous stablity of ACL, MCL, LCL, and PCL. With Lachman's test, anterior drawer, posterior drawer, valgus and varus stress test Negative patella apprehension test Meniscal evaluation: Equivocal McMurray's test, positive Thessaly's bilaterally antalgic gait secondary to severe genuine varum of the knees  FOOT EXAM:  General: well nourished Skin of LE: warm; dry, no rashes, lesions, ecchymosis or erythema. Vascular: Dorsal pedal pulses 2+ bilaterally Neurologically: Sensation to light touch lower extremities equal and intact bilaterally.  Observation - no ecchymosis, no edema, or hematoma present  Normal ankle motion and strength bilaterally  Extension/flexion 5/5 strength bilaterally in toes Weight-bearing foot exam:  First ray: Neutral  Second ray: Neutral Longitudinal arch: Mild collapse Heel position: Valgus Gait analysis:  Striking location: Heel Foot motion: Supination Knee motion:  Severe genuine varum Hip motion: External  rotation  ASSESSMENT & PLAN: See problem based charting & AVS for pt instructions.  Injection procedure: Consent obtained and verified. Sterile betadine prep. Furthur cleansed with alcohol. Topical analgesic spray: Ethyl chloride. Injection Indication: Left knee OA Approached in typical fashion with: lateral using ultrasound guidance to confirm placement Completed without difficulty Meds: 40mg  DepoMedrol and 3 cc lidocaine Needle: 22g 1.5in Aftercare instructions and Red flags advised. Advised to call if fevers/chills, erythema, induration, drainage, or persistent bleeding.    We spent greater than 50% of our 30 minute office visit in counseling education regarding the patient current clinical problem, risks and benefits of treatment options, and recommend plans for treatment or further evaluation

## 2014-03-07 NOTE — Patient Instructions (Addendum)
Interpreter is Delila Spence  It was great to see you today! Call the office if you have any questions [phone]   F/u in 2 week for custom orthotics

## 2014-03-21 ENCOUNTER — Encounter: Payer: No Typology Code available for payment source | Admitting: Sports Medicine

## 2014-04-19 HISTORY — PX: JOINT REPLACEMENT: SHX530

## 2014-04-25 ENCOUNTER — Encounter: Payer: Self-pay | Admitting: Sports Medicine

## 2014-04-25 ENCOUNTER — Ambulatory Visit (INDEPENDENT_AMBULATORY_CARE_PROVIDER_SITE_OTHER): Payer: Self-pay | Admitting: Sports Medicine

## 2014-04-25 ENCOUNTER — Encounter: Payer: No Typology Code available for payment source | Admitting: Sports Medicine

## 2014-04-25 VITALS — BP 150/81 | HR 49 | Ht <= 58 in | Wt 115.0 lb

## 2014-04-25 DIAGNOSIS — R269 Unspecified abnormalities of gait and mobility: Secondary | ICD-10-CM

## 2014-04-25 DIAGNOSIS — M1711 Unilateral primary osteoarthritis, right knee: Secondary | ICD-10-CM

## 2014-04-25 DIAGNOSIS — M21162 Varus deformity, not elsewhere classified, left knee: Secondary | ICD-10-CM

## 2014-04-25 DIAGNOSIS — M21161 Varus deformity, not elsewhere classified, right knee: Secondary | ICD-10-CM

## 2014-04-25 DIAGNOSIS — M1712 Unilateral primary osteoarthritis, left knee: Secondary | ICD-10-CM

## 2014-04-26 DIAGNOSIS — R269 Unspecified abnormalities of gait and mobility: Secondary | ICD-10-CM | POA: Insufficient documentation

## 2014-04-26 HISTORY — DX: Unspecified abnormalities of gait and mobility: R26.9

## 2014-04-26 NOTE — Progress Notes (Signed)
  Tina Johnson - 49 y.o. female MRN [id number]  Date of birth: 1966-02-26  SUBJECTIVE:  Including CC & ROS.  Patient is a 49 Hispanic female who present today for follow up on her history of bilateral knee DJD severe knee valgus and abnormality in gait. Today visit is to fit patient for custom orthotics. Patient normally present with a spanish interpreter but they her unavailable today. Patient reports continue improve in her knees after SI but wants recommendations on a knee brace. Patient had a good clinical response to the green insole with lateral wedge.  She describes intermittent sharp pain with a constant dull ache, mild intermittent swelling, and giving out. Denies any numbness or tingling in her lower extremity. Reports activity of walking and standing for long periods of time exacerbate her symptoms. Patient has been on naproxen 500 mg twice a day for pain control with good control of pain.    ROS: Review of systems otherwise negative except for information present in HPI  HISTORY: Past Medical, Surgical, Social, and Family History Reviewed & Updated per EMR. Pertinent Historical Findings include: HLD nonsmoker  DATA REVIEWED: Personally reviewed patient's 4 view right knee x-rays from October 2015 that showed moderate tricompartment osteoarthritis with moderate to severe medial jointline narrowing, and severe arthritis to the patellofemoral joint  PHYSICAL EXAM:  VS: BP:(!) 150/81 mmHg  HR:(!) 49bpm  TEMP: ( )  RESP:   HT:4\' 6"  (137.2 cm)   WT:115 lb (52.164 kg)  BMI:27.8 FOOT EXAM:  General: well nourished Skin of LE: warm; dry, no rashes, lesions, ecchymosis or erythema. Vascular: Dorsal pedal pulses 2+ bilaterally Neurologically: Sensation to light touch lower extremities equal and intact bilaterally.  Observation - no ecchymosis, no edema, or hematoma present  Palpation: no TTP in foot Normal ankle motion and strength bilaterally  Extension/flexion 5/5 strength  bilaterally in toes Weight-bearing foot exam:  First ray: neutral  Second ray: neutral Longitudinal arch: pes cavus Heel position: varus Gait analysis:  Striking location: midfoot Foot motion: supination Knee motion: severe varum Hip motion: externally rotating  ASSESSMENT & PLAN: See problem based charting & AVS for pt instructions. Recommendation for helping correct lateral compartment degenerative change in the knee with orthotic for gait correction for patient severe knee valgus and supination of gait F/U in 4 week to reassess response to orthotics, repeat gait analysis, and re-evaluate knee  Orthotic Fitting and Adjustment note: Patient was fitted for a : standard, cushioned, semi-rigid orthotic.  The orthotic was heated and afterward the patient stood on the orthotic blank positioned on the orthotic stand.  The patient was positioned in subtalar neutral position and 10 degrees of ankle dorsiflexion in a weight bearing stance.  After completion of molding, a stable base was applied to the orthotic blank.  The blank was ground to a stable position for weight bearing.  Size: 6 cut down Base: Blue EVA Posting: none Additional orthotic padding: considered lateral wedge but too bulky will consider a light blue foam wedge if gait is still abnormal at f/u in 4 weeks  Greater than 50% of the patient's visit for a total of 30 minutes, was spent conducting face-to-face counseling for bilateral foot orthotics fitting and constructing

## 2014-05-23 ENCOUNTER — Ambulatory Visit (INDEPENDENT_AMBULATORY_CARE_PROVIDER_SITE_OTHER): Payer: Self-pay | Admitting: Sports Medicine

## 2014-05-23 ENCOUNTER — Encounter: Payer: Self-pay | Admitting: Sports Medicine

## 2014-05-23 VITALS — BP 142/70 | Ht <= 58 in | Wt 115.0 lb

## 2014-05-23 DIAGNOSIS — M1712 Unilateral primary osteoarthritis, left knee: Secondary | ICD-10-CM

## 2014-05-23 MED ORDER — METHYLPREDNISOLONE ACETATE 40 MG/ML IJ SUSP
40.0000 mg | Freq: Once | INTRAMUSCULAR | Status: AC
  Administered 2014-05-23: 40 mg via INTRA_ARTICULAR

## 2014-05-23 NOTE — Progress Notes (Signed)
  Lyndel Dancel - 49 y.o. female MRN [id number]  Date of birth: 11/05/1965  SUBJECTIVE:  Including CC & ROS.  Patient is a 49 year old Hispanic female falling up today with bilateral knee pain with severe knee valgus and abnormal gait. Her last loss this visit patient was treated with custom orthotics which she reports has provided her significant relief in her gait abnormalities. Her knee pain is moderately better and responded well to injection therapy proximal 5 months ago but it started to increase in pain more recently. She is been decreasing her use of anti-inflammatories only for more severe days. She's been working out and gaining strength in her lower extremities. He describes the pain as a dull ache with intermittent swelling.   ROS: Review of systems otherwise negative except for information present in HPI  HISTORY: Past Medical, Surgical, Social, and Family History Reviewed & Updated per EMR. Pertinent Historical Findings include: HLD nonsmoker  DATA REVIEWED: Personally reviewed patient's 4 view right knee x-rays from October 2015 that showed moderate tricompartment osteoarthritis with moderate to severe medial jointline narrowing, and severe arthritis to the patellofemoral joint  PHYSICAL EXAM:  VS: BP:(!) 142/70 mmHg  HR: bpm  TEMP: ( )  RESP:   HT:4\' 6"  (137.2 cm)   WT:115 lb (52.164 kg)  BMI:27.8 KNEE EXAM:  General: well nourished, no acute distress Skin of LE: warm; dry, no rashes, lesions, ecchymosis or erythema. Vascular: Dorsal pedal pulses 2+ bilaterally Neurologically: Sensation to light touch lower extremities equal and intact  Normal to inspection with no erythema or effusion or obvious bony abnormalities. Palpation normal with no warmth or patellar tenderness or condyle tenderness.  moderate medial and lateral joint line tenderness. ROM normal in flexion and extension and lower leg rotation. Range of motion:   genu valgum ROM normal in flexion and  extension and lower leg rotation. Ligaments with solid consistent endpoints including ACL, PCL, LCL, MCL. Negative patella apprehension and normal tracking Meniscal evaluation: Negative McMurray's test, Negative thessaly's test, Normal gait. Hamstring and quadriceps strength is normal.  ASSESSMENT & PLAN: See problem based charting & AVS for pt instructions. Patient has long-standing history of [name] arthritic changes in her knees related to malnourishment and vitamin deficiencies as a child. Her x-rays of the past year have shown pre-significant moderate to severe tricompartment arthritic changes. Patient has been responding well to therapeutic exercise, custom orthotics, and intermittent intra-articular injections. Discussed with patient the merits of repeating injections therapy today to hopefully provide her with another 6 months of relief. Also patient provided patient with a knee brace for additional support and compression. If the patient finds of these injections are not providing her relief in the future we would likely need to consider referral to surgery for possible total joint replacement.   patient will follow-up as needed  Injection procedure: Consent obtained and verified. Sterile betadine prep. Furthur cleansed with alcohol and betadine. Topical analgesic spray: Ethyl chloride. Injection Indication:  Osteoarthritis of the knees Approached in typical fashion with:  Anterior lateral joint space injection Meds:  1 mL of 40 mg Depo-Medrol and 3 mL of lidocaine injected into each joint Needle:  25-gauge 1/2 inch needles were used for both injection Completed without difficulty Aftercare instructions and Red flags advised. Advised to call if fevers/chills, erythema, induration, drainage, or persistent bleeding.

## 2014-05-23 NOTE — Progress Notes (Signed)
silvia was the interpretor for this visit

## 2014-07-04 ENCOUNTER — Ambulatory Visit: Payer: No Typology Code available for payment source

## 2014-07-05 ENCOUNTER — Ambulatory Visit: Payer: No Typology Code available for payment source

## 2015-02-13 ENCOUNTER — Encounter: Payer: Self-pay | Admitting: Family Medicine

## 2015-02-13 ENCOUNTER — Ambulatory Visit (INDEPENDENT_AMBULATORY_CARE_PROVIDER_SITE_OTHER): Payer: No Typology Code available for payment source | Admitting: Family Medicine

## 2015-02-13 VITALS — BP 144/71 | HR 50 | Ht <= 58 in | Wt 115.0 lb

## 2015-02-13 DIAGNOSIS — M170 Bilateral primary osteoarthritis of knee: Secondary | ICD-10-CM

## 2015-02-13 DIAGNOSIS — R269 Unspecified abnormalities of gait and mobility: Secondary | ICD-10-CM

## 2015-02-13 MED ORDER — METHYLPREDNISOLONE ACETATE 40 MG/ML IJ SUSP
40.0000 mg | Freq: Once | INTRAMUSCULAR | Status: AC
  Administered 2015-02-13: 40 mg via INTRA_ARTICULAR

## 2015-02-13 MED ORDER — METHYLPREDNISOLONE ACETATE 40 MG/ML IJ SUSP
40.0000 mg | Freq: Once | INTRAMUSCULAR | Status: AC
Start: 2015-02-13 — End: 2015-02-13
  Administered 2015-02-13: 40 mg via INTRA_ARTICULAR

## 2015-02-13 NOTE — Progress Notes (Signed)
  Tina Johnson - 49 y.o. female MRN [id number]  Date of birth: 12-Oct-1965  SUBJECTIVE:  Including CC & ROS.  Patient is a 49 year old Hispanic female falling up today with bilateral knee pain with severe knee valgus and abnormal gait. Her knee pain is moderately better and responded well to injection therapy proximal 5 months ago but it started to increase in pain more recently. She's been working out and gaining strength in her lower extremities. Pain is worse now with standing even for about 10 minutes.    ROS: Review of systems otherwise negative except for information present in HPI  HISTORY: Past Medical, Surgical, Social, and Family History Reviewed & Updated per EMR. Pertinent Historical Findings include: HLD nonsmoker  DATA REVIEWED: Personally reviewed patient's 4 view right knee x-rays from October 2015 that showed moderate tricompartment osteoarthritis with moderate to severe medial jointline narrowing, and severe arthritis to the patellofemoral joint  PHYSICAL EXAM:  VS: BP:(!) 144/71 mmHg  HR:(!) 50bpm  TEMP: ( )  RESP:   HT:4\' 6"  (137.2 cm)   WT:115 lb (52.164 kg)  BMI:27.8 KNEE EXAM:  General: well nourished, no acute distress Skin of LE: warm; dry, no rashes, lesions, ecchymosis or erythema. Vascular: Dorsal pedal pulses 2+ bilaterally Neurologically: Sensation to light touch lower extremities equal and intact  Normal to inspection with no erythema or effusion or obvious bony abnormalities. Palpation normal with no warmth or patellar tenderness or condyle tenderness.  moderate medial and lateral joint line tenderness. ROM normal in flexion and extension and lower leg rotation. Range of motion:   genu valgum ROM normal in flexion and extension and lower leg rotation.  ASSESSMENT & PLAN: See problem based charting & AVS for pt instructions. Patient has long-standing history of [name] arthritic changes in her knees related to malnourishment and vitamin  deficiencies as a child. Her x-rays of the past year have shown pre-significant moderate to severe tricompartment arthritic changes. Patient has been responding well to therapeutic exercise, custom orthotics, and intermittent intra-articular injections. Discussed with patient the merits of repeating injections therapy today to hopefully provide her with another 6 months of relief. Continue with knee brace.Referral to ortho for possible TKA   patient will follow-up as needed  Injection procedure: Consent obtained and verified. Sterile betadine prep. Furthur cleansed with alcohol and betadine. Topical analgesic spray: Ethyl chloride. Injection Indication:  Osteoarthritis of the knees Approached in typical fashion with:  Anterior lateral joint space injection Meds:  1 mL of 40 mg Depo-Medrol and 3 mL of lidocaine injected into each joint Needle:  25-gauge 1/2 inch needles were used for both injection Completed without difficulty Aftercare instructions and Red flags advised. Advised to call if fevers/chills, erythema, induration, drainage, or persistent bleeding.

## 2015-02-13 NOTE — Progress Notes (Signed)
Patient ID: Tina Johnson, female   DOB: 27-Jul-1965, 49 y.o.   MRN: [id number]   Interpreter for visit is  Consuello Bossier

## 2015-02-13 NOTE — Addendum Note (Signed)
Addended by: Laurey Arrow on: 02/13/2015 11:08 AM   Modules accepted: Orders

## 2015-03-21 ENCOUNTER — Encounter (HOSPITAL_COMMUNITY)
Admission: RE | Admit: 2015-03-21 | Discharge: 2015-03-21 | Disposition: A | Discharge status: Home / Self Care | Payer: No Typology Code available for payment source | Source: Ambulatory Visit | Attending: Orthopaedic Surgery | Admitting: Orthopaedic Surgery

## 2015-03-21 ENCOUNTER — Encounter (HOSPITAL_COMMUNITY): Payer: Self-pay

## 2015-03-21 DIAGNOSIS — Z0183 Encounter for blood typing: Secondary | ICD-10-CM | POA: Diagnosis not present

## 2015-03-21 DIAGNOSIS — Z01818 Encounter for other preprocedural examination: Secondary | ICD-10-CM | POA: Diagnosis not present

## 2015-03-21 DIAGNOSIS — M170 Bilateral primary osteoarthritis of knee: Secondary | ICD-10-CM | POA: Insufficient documentation

## 2015-03-21 DIAGNOSIS — Z01812 Encounter for preprocedural laboratory examination: Secondary | ICD-10-CM | POA: Diagnosis not present

## 2015-03-21 HISTORY — DX: Anxiety disorder, unspecified: F41.9

## 2015-03-21 HISTORY — DX: Functional dyspepsia: K30

## 2015-03-21 HISTORY — DX: Unspecified osteoarthritis, unspecified site: M19.90

## 2015-03-21 LAB — BASIC METABOLIC PANEL
Anion gap: 9 (ref 5–15)
BUN: 28 mg/dL — AB (ref 6–20)
CALCIUM: 9.2 mg/dL (ref 8.9–10.3)
CHLORIDE: 104 mmol/L (ref 101–111)
CO2: 25 mmol/L (ref 22–32)
CREATININE: 0.52 mg/dL (ref 0.44–1.00)
Glucose, Bld: 104 mg/dL — ABNORMAL HIGH (ref 65–99)
Potassium: 4.1 mmol/L (ref 3.5–5.1)
SODIUM: 138 mmol/L (ref 135–145)

## 2015-03-21 LAB — ABO/RH: ABO/RH(D): O POS

## 2015-03-21 LAB — CBC
HCT: 44.3 % (ref 36.0–46.0)
Hemoglobin: 15.3 g/dL — ABNORMAL HIGH (ref 12.0–15.0)
MCH: 31.9 pg (ref 26.0–34.0)
MCHC: 34.5 g/dL (ref 30.0–36.0)
MCV: 92.5 fL (ref 78.0–100.0)
PLATELETS: 240 10*3/uL (ref 150–400)
RBC: 4.79 MIL/uL (ref 3.87–5.11)
RDW: 13.3 % (ref 11.5–15.5)
WBC: 9.9 10*3/uL (ref 4.0–10.5)

## 2015-03-21 LAB — TYPE AND SCREEN
ABO/RH(D): O POS
ANTIBODY SCREEN: NEGATIVE

## 2015-03-21 LAB — SURGICAL PCR SCREEN
MRSA, PCR: NEGATIVE
Staphylococcus aureus: NEGATIVE

## 2015-03-21 NOTE — Progress Notes (Signed)
In addition, of note, through interpretor, pt. Reports for one surgery they were trying to do spinal & she had an increase heart rate so it resulted in being put to sleep.

## 2015-03-21 NOTE — Progress Notes (Signed)
Call to Dr. Erlinda Hong office, spoke with Autumn to inform them that we need preop orders.

## 2015-03-21 NOTE — Pre-Procedure Instructions (Signed)
Simon Razo-Castro  03/21/2015      [facility] - [place], [place] - [id number] [place] [address] Phone: [phone] Fax: [phone]    Your procedure is scheduled on 04/03/2015   Report to [facility] Admitting at 10:00 A.M.   Call this number if you have problems the morning of surgery:   [phone]   Remember:  Do not eat food or drink liquids after midnight.   On Wednesday   Take these medicines the morning of surgery with A SIP OF WATER : NONE   Do not wear jewelry, make-up or nail polish.   Do not wear lotions, powders, or perfumes.  You may wear deodorant.   Do not shave 48 hours prior to surgery.    Do not bring valuables to the hospital.   [facility] is not responsible for any belongings or valuables.  Contacts, dentures or bridgework may not be worn into surgery.  Leave your suitcase in the car.  After surgery it may be brought to your room.  For patients admitted to the hospital, discharge time will be determined by your treatment team.  Patients discharged the day of surgery will not be allowed to drive home.   Name and phone number of your driver:   [place]  Special instructions: Special Instructions: [facility] - Preparing for Surgery  Before surgery, you can play an important role.  Because skin is not sterile, your skin needs to be as free of germs as possible.  You can reduce the number of germs on you skin by washing with CHG (chlorahexidine gluconate) soap before surgery.  CHG is an antiseptic cleaner which kills germs and bonds with the skin to continue killing germs even after washing.  Please DO NOT use if you have an allergy to CHG or antibacterial soaps.  If your skin becomes reddened/irritated stop using the CHG and inform your nurse when you arrive at Short Stay.  Do not shave (including legs and underarms) for at least 48 hours prior to the first CHG shower.  You  may shave your face.  Please follow these instructions carefully:   1.  Shower with CHG Soap the night before surgery and the  morning of Surgery.  2.  If you choose to wash your hair, wash your hair first as usual with your  normal shampoo.  3.  After you shampoo, rinse your hair and body thoroughly to remove the  Shampoo.  4.  Use CHG as you would any other liquid soap.  You can apply chg directly to the skin and wash gently with scrungie or a clean washcloth.  5.  Apply the CHG Soap to your body ONLY FROM THE NECK DOWN.    Do not use on open wounds or open sores.  Avoid contact with your eyes, ears, mouth and genitals (private parts).  Wash genitals (private parts)   with your normal soap.  6.  Wash thoroughly, paying special attention to the area where your surgery will be performed.  7.  Thoroughly rinse your body with warm water from the neck down.  8.  DO NOT shower/wash with your normal soap after using and rinsing off   the CHG Soap.  9.  Pat yourself dry with a clean towel.            10.  Wear clean pajamas.            11.  Place  clean sheets on your bed the night of your first shower and do not sleep with pets.  Day of Surgery  Do not apply any lotions/deodorants the morning of surgery.  Please wear clean clothes to the hospital/surgery center.  Please read over the following fact sheets that you were given. Pain Booklet, Coughing and Deep Breathing, Blood Transfusion Information, Total Joint Packet, MRSA Information and Surgical Site Infection Prevention

## 2015-03-21 NOTE — Progress Notes (Signed)
Very Lengthy visit for preadmission, with each question asked by Nurse the pt. Had a very lengthy response.  Pt. Is  Completely spanish speaking, translator - present- Administrator, arts with language resources.  Through in interpretor pt. Reports that she has indigestion ( but its been several months since she has had it last) , with the indigestion in the past- she first reported that she had testing here [facility])  for her heart, but after exploring [facility], I believe she was referring to the  endoscopy. She really is not connected to anyone for PCP that I can tell, the Rx she was carrying today was from 04/2014 fr. [facility].

## 2015-03-25 ENCOUNTER — Other Ambulatory Visit (HOSPITAL_COMMUNITY): Payer: Self-pay | Admitting: Orthopaedic Surgery

## 2015-04-02 MED ORDER — CEFAZOLIN SODIUM-DEXTROSE 2-3 GM-% IV SOLR
2.0000 g | INTRAVENOUS | Status: AC
  Administered 2015-04-03: 2 g via INTRAVENOUS
  Administered 2015-04-03: 1 g via INTRAVENOUS
  Filled 2015-04-02: qty 50

## 2015-04-02 MED ORDER — TRANEXAMIC ACID 1000 MG/10ML IV SOLN
1000.0000 mg | INTRAVENOUS | Status: AC
  Administered 2015-04-03 (×2): 1000 mg via INTRAVENOUS
  Filled 2015-04-02 (×2): qty 10

## 2015-04-02 MED ORDER — BUPIVACAINE LIPOSOME 1.3 % IJ SUSP
20.0000 mL | INTRAMUSCULAR | Status: DC
  Filled 2015-04-02: qty 20

## 2015-04-03 ENCOUNTER — Encounter (HOSPITAL_COMMUNITY)
Admission: AD | Disposition: A | Discharge status: Home Health | Payer: Self-pay | Source: Ambulatory Visit | Attending: Orthopaedic Surgery

## 2015-04-03 ENCOUNTER — Inpatient Hospital Stay (HOSPITAL_COMMUNITY): Payer: No Typology Code available for payment source

## 2015-04-03 ENCOUNTER — Inpatient Hospital Stay (HOSPITAL_COMMUNITY): Payer: No Typology Code available for payment source | Admitting: Certified Registered Nurse Anesthetist

## 2015-04-03 ENCOUNTER — Encounter (HOSPITAL_COMMUNITY): Payer: Self-pay | Admitting: *Deleted

## 2015-04-03 ENCOUNTER — Inpatient Hospital Stay (HOSPITAL_COMMUNITY)
Admission: AD | Admit: 2015-04-03 | Discharge: 2015-04-06 | DRG: 462 | Disposition: A | Discharge status: Home Health | Payer: No Typology Code available for payment source | Source: Ambulatory Visit | Attending: Orthopaedic Surgery | Admitting: Orthopaedic Surgery

## 2015-04-03 DIAGNOSIS — Z791 Long term (current) use of non-steroidal anti-inflammatories (NSAID): Secondary | ICD-10-CM | POA: Diagnosis not present

## 2015-04-03 DIAGNOSIS — Z96652 Presence of left artificial knee joint: Secondary | ICD-10-CM

## 2015-04-03 DIAGNOSIS — M174 Other bilateral secondary osteoarthritis of knee: Secondary | ICD-10-CM

## 2015-04-03 HISTORY — PX: TOTAL KNEE ARTHROPLASTY: SHX125

## 2015-04-03 HISTORY — DX: Other bilateral secondary osteoarthritis of knee: M17.4

## 2015-04-03 LAB — COMPREHENSIVE METABOLIC PANEL
ALT: 22 U/L (ref 14–54)
AST: 22 U/L (ref 15–41)
Albumin: 3.6 g/dL (ref 3.5–5.0)
Alkaline Phosphatase: 50 U/L (ref 38–126)
Anion gap: 8 (ref 5–15)
BUN: 15 mg/dL (ref 6–20)
CHLORIDE: 106 mmol/L (ref 101–111)
CO2: 26 mmol/L (ref 22–32)
CREATININE: 0.66 mg/dL (ref 0.44–1.00)
Calcium: 9 mg/dL (ref 8.9–10.3)
Glucose, Bld: 108 mg/dL — ABNORMAL HIGH (ref 65–99)
Potassium: 3.7 mmol/L (ref 3.5–5.1)
Sodium: 140 mmol/L (ref 135–145)
Total Bilirubin: 0.7 mg/dL (ref 0.3–1.2)
Total Protein: 7.2 g/dL (ref 6.5–8.1)

## 2015-04-03 LAB — CBC WITH DIFFERENTIAL/PLATELET
BASOS PCT: 1 %
Basophils Absolute: 0.1 10*3/uL (ref 0.0–0.1)
Eosinophils Absolute: 0 10*3/uL (ref 0.0–0.7)
Eosinophils Relative: 1 %
HEMATOCRIT: 43 % (ref 36.0–46.0)
Hemoglobin: 14.5 g/dL (ref 12.0–15.0)
LYMPHS ABS: 1.9 10*3/uL (ref 0.7–4.0)
LYMPHS PCT: 32 %
MCH: 31.6 pg (ref 26.0–34.0)
MCHC: 33.7 g/dL (ref 30.0–36.0)
MCV: 93.7 fL (ref 78.0–100.0)
MONO ABS: 0.4 10*3/uL (ref 0.1–1.0)
MONOS PCT: 7 %
NEUTROS ABS: 3.5 10*3/uL (ref 1.7–7.7)
Neutrophils Relative %: 59 %
Platelets: 244 10*3/uL (ref 150–400)
RBC: 4.59 MIL/uL (ref 3.87–5.11)
RDW: 13.4 % (ref 11.5–15.5)
WBC: 5.9 10*3/uL (ref 4.0–10.5)

## 2015-04-03 LAB — C-REACTIVE PROTEIN: CRP: <0.5 mg/dL (ref <1.0)

## 2015-04-03 LAB — APTT: aPTT: 28 seconds (ref 24–37)

## 2015-04-03 LAB — SEDIMENTATION RATE: Sed Rate: 7 mm/hr (ref 0–22)

## 2015-04-03 LAB — TYPE AND SCREEN
ABO/RH(D): O POS
Antibody Screen: NEGATIVE

## 2015-04-03 LAB — PROTIME-INR
INR: 1.11 (ref 0.00–1.49)
PROTHROMBIN TIME: 14.5 s (ref 11.6–15.2)

## 2015-04-03 SURGERY — ARTHROPLASTY, KNEE, BILATERAL, TOTAL
Anesthesia: Monitor Anesthesia Care | Site: Knee | Laterality: Right

## 2015-04-03 MED ORDER — OXYCODONE HCL ER 10 MG PO T12A: 10.0000 mg | EXTENDED_RELEASE_TABLET | Freq: Two times a day (BID) | ORAL | Status: DC

## 2015-04-03 MED ORDER — METHOCARBAMOL 500 MG PO TABS
500.0000 mg | ORAL_TABLET | Freq: Four times a day (QID) | ORAL | Status: DC | PRN
  Administered 2015-04-03 – 2015-04-06 (×6): 500 mg via ORAL
  Filled 2015-04-03 (×6): qty 1

## 2015-04-03 MED ORDER — PHENOL 1.4 % MT LIQD: 1.0000 | OROMUCOSAL | Status: DC | PRN

## 2015-04-03 MED ORDER — ALUM & MAG HYDROXIDE-SIMETH 200-200-20 MG/5ML PO SUSP
30.0000 mL | ORAL | Status: DC | PRN
Start: 2015-04-03 — End: 2015-04-06

## 2015-04-03 MED ORDER — KETOROLAC TROMETHAMINE 30 MG/ML IJ SOLN
INTRAMUSCULAR | Status: DC | PRN
  Administered 2015-04-03: 15 mg via INTRAVENOUS

## 2015-04-03 MED ORDER — OXYCODONE HCL 5 MG PO TABS: 5.0000 mg | ORAL_TABLET | Freq: Once | ORAL | Status: DC | PRN

## 2015-04-03 MED ORDER — CHLORHEXIDINE GLUCONATE 4 % EX LIQD: 60.0000 mL | Freq: Once | CUTANEOUS | Status: DC

## 2015-04-03 MED ORDER — PROPOFOL 500 MG/50ML IV EMUL
INTRAVENOUS | Status: DC | PRN
Start: 2015-04-03 — End: 2015-04-03
  Administered 2015-04-03: 50 ug/kg/min via INTRAVENOUS
  Administered 2015-04-03: 16:00:00 via INTRAVENOUS

## 2015-04-03 MED ORDER — METOCLOPRAMIDE HCL 5 MG PO TABS: 5.0000 mg | ORAL_TABLET | Freq: Three times a day (TID) | ORAL | Status: DC | PRN

## 2015-04-03 MED ORDER — BUPIVACAINE HCL (PF) 0.5 % IJ SOLN
INTRAMUSCULAR | Status: DC | PRN
  Administered 2015-04-03: 3 mL via INTRATHECAL

## 2015-04-03 MED ORDER — MIDAZOLAM HCL 2 MG/2ML IJ SOLN
INTRAMUSCULAR | Status: AC
  Filled 2015-04-03: qty 2

## 2015-04-03 MED ORDER — DIPHENHYDRAMINE HCL 12.5 MG/5ML PO ELIX: 25.0000 mg | ORAL_SOLUTION | ORAL | Status: DC | PRN

## 2015-04-03 MED ORDER — LIDOCAINE HCL (CARDIAC) 20 MG/ML IV SOLN
INTRAVENOUS | Status: DC | PRN
  Administered 2015-04-03: 50 mg via INTRAVENOUS

## 2015-04-03 MED ORDER — KETOROLAC TROMETHAMINE 30 MG/ML IJ SOLN
30.0000 mg | Freq: Four times a day (QID) | INTRAMUSCULAR | Status: AC | PRN
Start: 2015-04-03 — End: 2015-04-05
  Administered 2015-04-03 – 2015-04-04 (×2): 30 mg via INTRAVENOUS
  Filled 2015-04-03 (×2): qty 1

## 2015-04-03 MED ORDER — MIDAZOLAM HCL 2 MG/2ML IJ SOLN
INTRAMUSCULAR | Status: DC | PRN
  Administered 2015-04-03 (×3): 2 mg via INTRAVENOUS

## 2015-04-03 MED ORDER — KETOROLAC TROMETHAMINE 30 MG/ML IJ SOLN
INTRAMUSCULAR | Status: AC
  Filled 2015-04-03: qty 1

## 2015-04-03 MED ORDER — SODIUM CHLORIDE 0.9 % IV SOLN
2000.0000 mg | INTRAVENOUS | Status: AC
  Administered 2015-04-03: 2000 mg via TOPICAL
  Filled 2015-04-03: qty 20

## 2015-04-03 MED ORDER — ESTRADIOL 0.1 MG/24HR TD PTWK: 0.1000 mg | MEDICATED_PATCH | TRANSDERMAL | Status: DC

## 2015-04-03 MED ORDER — ROPIVACAINE HCL 2 MG/ML IJ SOLN
8.0000 mL/h | INTRAMUSCULAR | Status: DC
  Administered 2015-04-03: 6 mL/h via EPIDURAL
  Filled 2015-04-03: qty 200

## 2015-04-03 MED ORDER — POVIDONE-IODINE 10 % EX SOLN
CUTANEOUS | Status: DC | PRN
Start: 2015-04-03 — End: 2015-04-03
  Administered 2015-04-03 (×2): 1 via TOPICAL

## 2015-04-03 MED ORDER — ONDANSETRON HCL 4 MG PO TABS: 4.0000 mg | ORAL_TABLET | Freq: Three times a day (TID) | ORAL | Status: DC | PRN

## 2015-04-03 MED ORDER — PROPOFOL 10 MG/ML IV BOLUS
INTRAVENOUS | Status: AC
Start: 2015-04-03 — End: 2015-04-03
  Filled 2015-04-03: qty 20

## 2015-04-03 MED ORDER — TRANEXAMIC ACID 1000 MG/10ML IV SOLN
2000.0000 mg | INTRAVENOUS | Status: AC
  Administered 2015-04-03: 2000 mg via TOPICAL
  Filled 2015-04-03: qty 20

## 2015-04-03 MED ORDER — ONDANSETRON HCL 4 MG/2ML IJ SOLN
4.0000 mg | Freq: Four times a day (QID) | INTRAMUSCULAR | Status: DC | PRN
  Administered 2015-04-03: 4 mg via INTRAVENOUS
  Filled 2015-04-03: qty 2

## 2015-04-03 MED ORDER — CEFAZOLIN SODIUM-DEXTROSE 2-3 GM-% IV SOLR
2.0000 g | Freq: Four times a day (QID) | INTRAVENOUS | Status: AC
  Administered 2015-04-03 – 2015-04-04 (×2): 2 g via INTRAVENOUS
  Filled 2015-04-03 (×2): qty 50

## 2015-04-03 MED ORDER — ACETAMINOPHEN 500 MG PO TABS
1000.0000 mg | ORAL_TABLET | Freq: Four times a day (QID) | ORAL | Status: AC
  Administered 2015-04-03 – 2015-04-04 (×3): 1000 mg via ORAL
  Filled 2015-04-03 (×4): qty 2

## 2015-04-03 MED ORDER — OXYCODONE HCL 5 MG PO TABS: 5.0000 mg | ORAL_TABLET | ORAL | Status: DC | PRN

## 2015-04-03 MED ORDER — SODIUM CHLORIDE 0.9 % IJ SOLN
INTRAMUSCULAR | Status: AC
  Filled 2015-04-03: qty 10

## 2015-04-03 MED ORDER — DEXTROSE 5 % IV SOLN: 10.0000 mg | INTRAVENOUS | Status: DC | PRN

## 2015-04-03 MED ORDER — ACETAMINOPHEN 325 MG PO TABS: 325.0000 mg | ORAL_TABLET | ORAL | Status: DC | PRN

## 2015-04-03 MED ORDER — CEFAZOLIN SODIUM-DEXTROSE 2-3 GM-% IV SOLR
INTRAVENOUS | Status: AC
  Filled 2015-04-03: qty 50

## 2015-04-03 MED ORDER — GLYCOPYRROLATE 0.2 MG/ML IJ SOLN
INTRAMUSCULAR | Status: DC | PRN
  Administered 2015-04-03: 0.1 mg via INTRAVENOUS

## 2015-04-03 MED ORDER — ENOXAPARIN SODIUM 40 MG/0.4ML ~~LOC~~ SOLN: 40.0000 mg | Freq: Every day | SUBCUTANEOUS | Status: DC

## 2015-04-03 MED ORDER — TRANEXAMIC ACID 1000 MG/10ML IV SOLN
1000.0000 mg | INTRAVENOUS | Status: DC
  Filled 2015-04-03: qty 10

## 2015-04-03 MED ORDER — FENTANYL CITRATE (PF) 250 MCG/5ML IJ SOLN
INTRAMUSCULAR | Status: AC
  Filled 2015-04-03: qty 5

## 2015-04-03 MED ORDER — FENTANYL CITRATE (PF) 100 MCG/2ML IJ SOLN
INTRAMUSCULAR | Status: AC
  Filled 2015-04-03: qty 2

## 2015-04-03 MED ORDER — LIDOCAINE HCL (CARDIAC) 20 MG/ML IV SOLN
INTRAVENOUS | Status: AC
  Filled 2015-04-03: qty 5

## 2015-04-03 MED ORDER — MORPHINE SULFATE (PF) 2 MG/ML IV SOLN
2.0000 mg | INTRAVENOUS | Status: DC | PRN
  Administered 2015-04-04 – 2015-04-05 (×5): 2 mg via INTRAVENOUS
  Filled 2015-04-03 (×5): qty 1

## 2015-04-03 MED ORDER — SENNOSIDES-DOCUSATE SODIUM 8.6-50 MG PO TABS: 1.0000 | ORAL_TABLET | Freq: Every evening | ORAL | Status: DC | PRN

## 2015-04-03 MED ORDER — DEXTROSE 5 % IV SOLN
500.0000 mg | Freq: Four times a day (QID) | INTRAVENOUS | Status: DC | PRN
  Filled 2015-04-03: qty 5

## 2015-04-03 MED ORDER — OXYCODONE HCL 5 MG PO TABS
5.0000 mg | ORAL_TABLET | ORAL | Status: DC | PRN
  Administered 2015-04-05: 15 mg via ORAL
  Administered 2015-04-05: 10 mg via ORAL
  Administered 2015-04-05 – 2015-04-06 (×2): 15 mg via ORAL
  Administered 2015-04-06: 10 mg via ORAL
  Filled 2015-04-03: qty 3
  Filled 2015-04-03 (×2): qty 2
  Filled 2015-04-03 (×2): qty 3

## 2015-04-03 MED ORDER — METHOCARBAMOL 750 MG PO TABS: 750.0000 mg | ORAL_TABLET | Freq: Two times a day (BID) | ORAL | Status: DC | PRN

## 2015-04-03 MED ORDER — ACETAMINOPHEN 650 MG RE SUPP: 650.0000 mg | Freq: Four times a day (QID) | RECTAL | Status: DC | PRN

## 2015-04-03 MED ORDER — 0.9 % SODIUM CHLORIDE (POUR BTL) OPTIME
TOPICAL | Status: DC | PRN
  Administered 2015-04-03 (×2): 1000 mL

## 2015-04-03 MED ORDER — EPHEDRINE SULFATE 50 MG/ML IJ SOLN
INTRAMUSCULAR | Status: DC | PRN
  Administered 2015-04-03 (×6): 5 mg via INTRAVENOUS

## 2015-04-03 MED ORDER — FENTANYL CITRATE (PF) 250 MCG/5ML IJ SOLN
INTRAMUSCULAR | Status: DC | PRN
  Administered 2015-04-03 (×4): 25 ug via INTRAVENOUS

## 2015-04-03 MED ORDER — ONDANSETRON HCL 4 MG PO TABS: 4.0000 mg | ORAL_TABLET | Freq: Four times a day (QID) | ORAL | Status: DC | PRN

## 2015-04-03 MED ORDER — SODIUM CHLORIDE 0.9 % IV SOLN
INTRAVENOUS | Status: DC
  Administered 2015-04-03: via INTRAVENOUS

## 2015-04-03 MED ORDER — HYDROMORPHONE HCL 1 MG/ML IJ SOLN: 0.2500 mg | INTRAMUSCULAR | Status: DC | PRN

## 2015-04-03 MED ORDER — EPHEDRINE SULFATE 50 MG/ML IJ SOLN
INTRAMUSCULAR | Status: AC
  Filled 2015-04-03: qty 1

## 2015-04-03 MED ORDER — ACETAMINOPHEN 10 MG/ML IV SOLN
INTRAVENOUS | Status: AC
  Filled 2015-04-03: qty 100

## 2015-04-03 MED ORDER — LACTATED RINGERS IV SOLN
INTRAVENOUS | Status: DC | PRN
  Administered 2015-04-03 (×3): via INTRAVENOUS

## 2015-04-03 MED ORDER — ACETAMINOPHEN 10 MG/ML IV SOLN
INTRAVENOUS | Status: DC | PRN
  Administered 2015-04-03: 1000 mg via INTRAVENOUS

## 2015-04-03 MED ORDER — OMEGA-3-ACID ETHYL ESTERS 1 G PO CAPS
1000.0000 mg | ORAL_CAPSULE | Freq: Every day | ORAL | Status: DC
  Administered 2015-04-03 – 2015-04-06 (×4): 1000 mg via ORAL
  Filled 2015-04-03 (×7): qty 1

## 2015-04-03 MED ORDER — LACTATED RINGERS IV SOLN: INTRAVENOUS | Status: DC

## 2015-04-03 MED ORDER — DEXMEDETOMIDINE HCL IN NACL 200 MCG/50ML IV SOLN
INTRAVENOUS | Status: AC
  Filled 2015-04-03: qty 50

## 2015-04-03 MED ORDER — ESTROGENS, CONJUGATED 0.625 MG/GM VA CREA
1.0000 | TOPICAL_CREAM | VAGINAL | Status: DC
  Filled 2015-04-03: qty 30

## 2015-04-03 MED ORDER — METOCLOPRAMIDE HCL 5 MG/ML IJ SOLN: 5.0000 mg | Freq: Three times a day (TID) | INTRAMUSCULAR | Status: DC | PRN

## 2015-04-03 MED ORDER — ACETAMINOPHEN 325 MG PO TABS: 650.0000 mg | ORAL_TABLET | Freq: Four times a day (QID) | ORAL | Status: DC | PRN

## 2015-04-03 MED ORDER — OXYCODONE HCL ER 15 MG PO T12A
15.0000 mg | EXTENDED_RELEASE_TABLET | Freq: Two times a day (BID) | ORAL | Status: DC
  Administered 2015-04-03 – 2015-04-05 (×5): 15 mg via ORAL
  Filled 2015-04-03 (×6): qty 1

## 2015-04-03 MED ORDER — OXYCODONE HCL 5 MG/5ML PO SOLN: 5.0000 mg | Freq: Once | ORAL | Status: DC | PRN

## 2015-04-03 MED ORDER — ACETAMINOPHEN 160 MG/5ML PO SOLN: 325.0000 mg | ORAL | Status: DC | PRN

## 2015-04-03 MED ORDER — ONDANSETRON HCL 4 MG/2ML IJ SOLN
INTRAMUSCULAR | Status: AC
  Filled 2015-04-03: qty 2

## 2015-04-03 MED ORDER — MENTHOL 3 MG MT LOZG: 1.0000 | LOZENGE | OROMUCOSAL | Status: DC | PRN

## 2015-04-03 MED ORDER — CELECOXIB 200 MG PO CAPS
200.0000 mg | ORAL_CAPSULE | Freq: Two times a day (BID) | ORAL | Status: DC
  Administered 2015-04-03 – 2015-04-04 (×2): 200 mg via ORAL
  Filled 2015-04-03 (×2): qty 1

## 2015-04-03 MED ORDER — SUCCINYLCHOLINE CHLORIDE 20 MG/ML IJ SOLN
INTRAMUSCULAR | Status: AC
  Filled 2015-04-03: qty 2

## 2015-04-03 MED ORDER — ESTROGENS CONJUGATED 0.625 MG PO TABS: 0.6250 mg | ORAL_TABLET | Freq: Every day | ORAL | Status: DC

## 2015-04-03 MED ORDER — SODIUM CHLORIDE 0.9 % IR SOLN
Status: DC | PRN
  Administered 2015-04-03 (×4): 1000 mL

## 2015-04-03 MED ORDER — HEPARIN SODIUM (PORCINE) 5000 UNIT/ML IJ SOLN: 5000.0000 [IU] | Freq: Three times a day (TID) | INTRAMUSCULAR | Status: DC

## 2015-04-03 SURGICAL SUPPLY — 82 items
ADH SKN CLS APL DERMABOND .7 (GAUZE/BANDAGES/DRESSINGS) ×6
ALCOHOL ISOPROPYL (RUBBING) (MISCELLANEOUS) ×3 IMPLANT
BANDAGE ELASTIC 6 VELCRO ST LF (GAUZE/BANDAGES/DRESSINGS) ×6 IMPLANT
BANDAGE ESMARK 6X9 LF (GAUZE/BANDAGES/DRESSINGS) ×2 IMPLANT
BLADE SAG 18X100X1.27 (BLADE) ×4 IMPLANT
BLADE SAW SGTL 13.0X1.19X90.0M (BLADE) ×4 IMPLANT
BLADE SURG 15 STRL LF DISP TIS (BLADE) IMPLANT
BLADE SURG 15 STRL SS (BLADE) ×6
BNDG CMPR 9X6 STRL LF SNTH (GAUZE/BANDAGES/DRESSINGS) ×4
BNDG COHESIVE 4X5 TAN STRL (GAUZE/BANDAGES/DRESSINGS) ×2 IMPLANT
BNDG COHESIVE 6X5 TAN STRL LF (GAUZE/BANDAGES/DRESSINGS) ×2 IMPLANT
BNDG ESMARK 6X9 LF (GAUZE/BANDAGES/DRESSINGS) ×6
BONE CEMENT PALACOSE (Orthopedic Implant) ×12 IMPLANT
BOWL SMART MIX CTS (DISPOSABLE) ×4 IMPLANT
CAPT KNEE TOTAL 3 ×2 IMPLANT
CEMENT BONE PALACOSE (Orthopedic Implant) ×4 IMPLANT
CLSR STERI-STRIP ANTIMIC 1/2X4 (GAUZE/BANDAGES/DRESSINGS) ×2 IMPLANT
COVER SURGICAL LIGHT HANDLE (MISCELLANEOUS) ×4 IMPLANT
CUFF TOURNIQUET SINGLE 34IN LL (TOURNIQUET CUFF) ×2 IMPLANT
CUFF TOURNIQUET SINGLE 44IN (TOURNIQUET CUFF) IMPLANT
DERMABOND ADVANCED (GAUZE/BANDAGES/DRESSINGS) ×3
DERMABOND ADVANCED .7 DNX12 (GAUZE/BANDAGES/DRESSINGS) ×2 IMPLANT
DRAPE EXTREMITY BILATERAL (DRAPE) ×3 IMPLANT
DRAPE EXTREMITY T 121X128X90 (DRAPE) ×3 IMPLANT
DRAPE IMP U-DRAPE 54X76 (DRAPES) ×3 IMPLANT
DRAPE INCISE IOBAN 66X45 STRL (DRAPES) ×4 IMPLANT
DRAPE ORTHO SPLIT 77X108 STRL (DRAPES) ×12
DRAPE PROXIMA HALF (DRAPES) ×4 IMPLANT
DRAPE SURG 17X11 SM STRL (DRAPES) ×6 IMPLANT
DRAPE SURG ORHT 6 SPLT 77X108 (DRAPES) ×4 IMPLANT
DRSG MEPILEX BORDER 4X8 (GAUZE/BANDAGES/DRESSINGS) ×2 IMPLANT
DURAPREP 26ML APPLICATOR (WOUND CARE) ×10 IMPLANT
ELECT CAUTERY BLADE 6.4 (BLADE) ×4 IMPLANT
ELECT REM PT RETURN 9FT ADLT (ELECTROSURGICAL) ×3
ELECTRODE REM PT RTRN 9FT ADLT (ELECTROSURGICAL) ×2 IMPLANT
EVACUATOR 1/8 PVC DRAIN (DRAIN) IMPLANT
FACESHIELD WRAPAROUND (MASK) ×9 IMPLANT
FACESHIELD WRAPAROUND OR TEAM (MASK) ×6 IMPLANT
GLOVE BIOGEL PI IND STRL 6.5 (GLOVE) IMPLANT
GLOVE BIOGEL PI IND STRL 7.0 (GLOVE) IMPLANT
GLOVE BIOGEL PI INDICATOR 6.5 (GLOVE) ×2
GLOVE BIOGEL PI INDICATOR 7.0 (GLOVE) ×1
GLOVE NEODERM STRL 7.5 LF PF (GLOVE) IMPLANT
GLOVE SURG NEODERM 7.5  LF PF (GLOVE) ×2
GLOVE SURG SS PI 6.5 STRL IVOR (GLOVE) ×2 IMPLANT
GLOVE SURG SYN 7.5  E (GLOVE) ×2
GLOVE SURG SYN 7.5 E (GLOVE) ×4 IMPLANT
GLOVE SURG SYN 7.5 PF PI (GLOVE) ×4 IMPLANT
GOWN STRL REIN XL XLG (GOWN DISPOSABLE) ×12 IMPLANT
HANDPIECE INTERPULSE COAX TIP (DISPOSABLE) ×6
KIT BASIN OR (CUSTOM PROCEDURE TRAY) ×3 IMPLANT
KIT ROOM TURNOVER OR (KITS) ×3 IMPLANT
MANIFOLD NEPTUNE II (INSTRUMENTS) ×3 IMPLANT
NDL SPNL 18GX3.5 QUINCKE PK (NEEDLE) ×2 IMPLANT
NEEDLE SPNL 18GX3.5 QUINCKE PK (NEEDLE) ×3 IMPLANT
NS IRRIG 1000ML POUR BTL (IV SOLUTION) ×3 IMPLANT
PACK TOTAL JOINT (CUSTOM PROCEDURE TRAY) ×3 IMPLANT
PACK UNIVERSAL I (CUSTOM PROCEDURE TRAY) ×3 IMPLANT
PAD ARMBOARD 7.5X6 YLW CONV (MISCELLANEOUS) ×6 IMPLANT
PADDING CAST ABS 6INX4YD NS (CAST SUPPLIES) ×1
PADDING CAST ABS COTTON 6X4 NS (CAST SUPPLIES) IMPLANT
PEN SKIN MARKING BROAD (MISCELLANEOUS) ×4 IMPLANT
PIN TROCAR 3 INCH (PIN) ×2 IMPLANT
SET HNDPC FAN SPRY TIP SCT (DISPOSABLE) ×2 IMPLANT
SMITH & NEPHEW 45MM RIMMED  SPEED PIN ×1 IMPLANT
SPONGE GAUZE 4X4 12PLY STER LF (GAUZE/BANDAGES/DRESSINGS) ×2 IMPLANT
STAPLER VISISTAT 35W (STAPLE) IMPLANT
SUCTION FRAZIER TIP 10 FR DISP (SUCTIONS) ×1 IMPLANT
SUT ETHILON 2 0 FS 18 (SUTURE) ×6 IMPLANT
SUT MNCRL AB 4-0 PS2 18 (SUTURE) ×2 IMPLANT
SUT VIC AB 0 CT1 27 (SUTURE) ×6
SUT VIC AB 0 CT1 27XBRD ANBCTR (SUTURE) ×4 IMPLANT
SUT VIC AB 1 CT1 27 (SUTURE) ×18
SUT VIC AB 1 CT1 27XBRD ANBCTR (SUTURE) ×4 IMPLANT
SUT VIC AB 1 CTX 27 (SUTURE) ×2 IMPLANT
SUT VIC AB 2-0 CT1 27 (SUTURE) ×18
SUT VIC AB 2-0 CT1 TAPERPNT 27 (SUTURE) ×6 IMPLANT
SYR 50ML LL SCALE MARK (SYRINGE) ×4 IMPLANT
TOWEL OR 17X24 6PK STRL BLUE (TOWEL DISPOSABLE) ×4 IMPLANT
TOWEL OR 17X26 10 PK STRL BLUE (TOWEL DISPOSABLE) ×4 IMPLANT
WATER STERILE IRR 1000ML POUR (IV SOLUTION) ×8 IMPLANT
WRAP KNEE MAXI GEL POST OP (GAUZE/BANDAGES/DRESSINGS) ×4 IMPLANT

## 2015-04-03 NOTE — Op Note (Signed)
Total Knee Arthroplasty Procedure Note Dann Magistro [id number] 04/03/2015   Preoperative diagnosis: Bilateral knee osteoarthritis  Postoperative diagnosis:same  Operative procedure:  1. Right total knee arthroplasty. CPT [phone] 2. Left total knee arthroplasty. CPT [phone] - separate incision  Surgeon: N. Eduard Roux, MD  Assistants: April Green, RNFA  Anesthesia: Spinal, epidural  Tourniquet time: less than 100 minutes for each side  Implants used: [employer]hew Femur: Legion 4 PS Narrow (both sides) Tibia: Genesis II 3 (both sides) Patella: 26 mm (both sides) Polyethylene: 9 mm (both sides)  Indication: Tina Johnson is a 49 y.o. year old female with a history of knee pain. Having failed conservative management, the patient elected to proceed with bilateral knee arthroplasty.  We have reviewed the risk and benefits of the surgery and they elected to proceed after voicing understanding.  Procedure:  After informed consent was obtained and understanding of the risk were voiced including but not limited to bleeding, infection, damage to surrounding structures including nerves and vessels, blood clots, leg length inequality and the failure to achieve desired results, the operative extremity was marked with verbal confirmation of the patient in the holding area.   The patient was then brought to the operating room and transported to the operating room table in the supine position.  A spinal and indwelling epidural was placed by anesthesia.  She tolerated the procedures well.  A tourniquet was applied to the right lower extremity around the upper thigh. The operative limb was then prepped and draped in the usual sterile fashion and preoperative antibiotics were administered.  A time out was performed prior to the start of surgery confirming the correct extremity, preoperative antibiotic administration, as well as team members, implants and instruments available for  the case. Correct surgical site was also confirmed with preoperative radiographs. The limb was then elevated for exsanguination and the tourniquet was inflated. A midline incision was made and a standard medial parapatellar approach was performed.  The patella was prepared and sized to a 26 mm.  A cover was placed on the patella for protection from retractors.  We then turned our attention to the femur. Posterior cruciate ligament was sacrificed. Start site was drilled in the femur and the intramedullary distal femoral cutting guide was placed, set at 6 degrees valgus, taking 7 mm of distal resection. The distal cut was made. Osteophytes were then removed. Next, the proximal tibial cutting guide was placed with appropriate slope, varus/valgus alignment and depth of resection. The proximal tibial cut was made. Gap blocks were then used to assess the extension gap and alignment, and appropriate soft tissue releases were performed. Attention was turned back to the femur, which was sized using the sizing guide to a size 4. Appropriate rotation of the femoral component was determined using epicondylar axis, Whiteside's line, and assessing the flexion gap under ligament tension. The appropriate size 4-in-1 cutting block was placed and cuts were made. Posterior femoral osteophytes and uncapped bone were then removed with the curved osteotome. The tibia was sized for a size 3 component. The femoral box-cutting guide was placed and prepared for a PS femoral component. Trial components were placed, and stability was checked in full extension, mid-flexion, and deep flexion. Proper tibial rotation was determined and marked.  The patella tracked well after the IT band and lateral capsule were pie crusted with a 15 blade. Trial components were then removed and tibial preparation performed.  The bony surfaces were irrigated with a pulse lavage and then  dried. Bone cement was vacuum mixed on the back table, and the final  components sized above were cemented into place. After cement had finished curing, excess cement was removed. The stability of the construct was re-evaluated throughout a range of motion and found to be acceptable. The trial liner was removed, the knee was copiously irrigated, and the knee was re-evaluated for any excess bone debris. The real polyethylene liner, 9 mm thick, was inserted and checked to ensure the locking mechanism had engaged appropriately. The tourniquet was deflated and hemostasis was achieved. The wound was irrigated with dilute betadine in normal saline, and then again with normal saline. A drain was not placed.  Capsular closure was performed with a #1 vicryl, subcutaneous fat closed with a 2.0 vicryl suture, then subcutaneous tissue closed with interrupted 2.0 vicryl suture. The skin was then closed with a 4.0 monocryl. A sterile dressing was applied.  We then turned our attention to the left knee.  A nonsterile tourniquet was placed on the upper left thigh. New sterile instruments were used for the left side.  Antibiotics were redosed.  SCDs were placed on the right lower leg.  The operative limb was then prepped and draped in the usual sterile fashion and preoperative antibiotics were administered.  A time out was performed prior to the start of surgery confirming the correct extremity, as well as team members, implants and instruments available for the case. Correct surgical site was also confirmed with preoperative radiographs. The limb was then elevated for exsanguination and the tourniquet was inflated. A midline incision was made and a standard medial parapatellar approach was performed.  The patella was prepared and sized to a 26 mm.  A cover was placed on the patella for protection from retractors.  We then turned our attention to the femur. Posterior cruciate ligament was sacrificed. Start site was drilled in the femur and the intramedullary distal femoral cutting guide was placed,  set at 6 degrees valgus, taking 7 mm of distal resection. The distal cut was made. Osteophytes were then removed. Next, the proximal tibial cutting guide was placed with appropriate slope, varus/valgus alignment and depth of resection. The proximal tibial cut was made. Gap blocks were then used to assess the extension gap and alignment, and appropriate soft tissue releases were performed. Attention was turned back to the femur, which was sized using the sizing guide to a size 4. Appropriate rotation of the femoral component was determined using epicondylar axis, Whiteside's line, and assessing the flexion gap under ligament tension. The appropriate size 4-in-1 cutting block was placed and cuts were made. Posterior femoral osteophytes and uncapped bone were then removed with the curved osteotome. The tibia was sized for a size 3 component. The femoral box-cutting guide was placed and prepared for a PS femoral component. Trial components were placed, and stability was checked in full extension, mid-flexion, and deep flexion. Proper tibial rotation was determined and marked.  The patella tracked well without a lateral release. Trial components were then removed and tibial preparation performed.  The bony surfaces were irrigated with a pulse lavage and then dried. Bone cement was vacuum mixed on the back table, and the final components sized above were cemented into place. After cement had finished curing, excess cement was removed. The stability of the construct was re-evaluated throughout a range of motion and found to be acceptable. The trial liner was removed, the knee was copiously irrigated, and the knee was re-evaluated for any excess bone debris. The  real polyethylene liner, 9 mm thick, was inserted and checked to ensure the locking mechanism had engaged appropriately. The tourniquet was deflated and hemostasis was achieved. The wound was irrigated with dilute betadine in normal saline, and then again with  normal saline. A drain was not placed.  Capsular closure was performed with a #1 vicryl, subcutaneous fat closed with a 2.0 vicryl suture, then subcutaneous tissue closed with interrupted 2.0 vicryl suture. The skin was then closed with a 4.0 monocryl. A sterile dressing was applied. The patient was awakened in the operating room and taken to recovery in stable condition. All sponge, needle, and instrument counts were correct at the end of the case.  Position: supine  Complications: none.  Time Out: performed   Drains/Packing: none  Estimated blood loss: minimal  Returned to Recovery Room: in good condition.   Antibiotics: yes   Mechanical VTE (DVT) Prophylaxis: sequential compression devices, TED thigh-high  Chemical VTE (DVT) Prophylaxis: subcutaneous heparin during epidural then lovenox after epidural removed  Fluid Replacement  Crystalloid: see anesthesia record Blood: none  FFP: none   Sponge and Instrument Count Correct? yes   PACU: portable radiograph - knee AP and Lateral   Admission: inpatient status, start PT & OT POD#1  Plan/RTC: Return in 2 weeks for wound check.   Weight Bearing/Load Lower Extremity: full   N. Eduard Roux, MD [place] 8:53 PM

## 2015-04-03 NOTE — Anesthesia Procedure Notes (Addendum)
Performed by: Salli Quarry WEAVER Oxygen Delivery Method: Nasal cannula   Epidural Patient location during procedure: OR  Staffing Anesthesiologist: Burech Mcfarland Performed by: anesthesiologist   Preanesthetic Checklist Completed: patient identified, surgical consent, pre-op evaluation, timeout performed, IV checked, risks and benefits discussed and monitors and equipment checked  Epidural Patient position: sitting Prep: ChloraPrep Patient monitoring: heart rate, cardiac monitor, continuous pulse ox and blood pressure Approach: midline Location: L3-L4 Injection technique: LOR saline  Needle:  Needle type: Tuohy  Needle gauge: 17 G Needle length: 9 cm Needle insertion depth: 4 cm Catheter type: closed end flexible Catheter size: 19 Gauge Catheter at skin depth: 10 cm Test dose: negative and 1.5% lidocaine with Epi 1:200 K  Assessment Events: blood not aspirated, injection not painful, no injection resistance, negative IV test and no paresthesia  Additional Notes 27 gauge sprotte placed through touhy upon lor with saline, csf freely flowing, spinal local injected, catheter placed through touhy for completion of CSE procedureReason for block:procedure for pain

## 2015-04-03 NOTE — H&P (Signed)
    PREOPERATIVE H&P  Chief Complaint: Bilateral knee degenerative joint disease  HPI: Tina Johnson is a 49 y.o. female who presents for surgical treatment of Bilateral knee degenerative joint disease.  She denies any changes in medical history.  Past Medical History  Diagnosis Date  . Anxiety     controls with exercise   . Indigestion     no medicine at present  . Arthritis     knees , shoulders   Past Surgical History  Procedure Laterality Date  . Abdominal hysterectomy    . Total abdominal hysterectomy w/ bilateral salpingoophorectomy    . Vaginal delivery      ?x2   Social History   Social History  . Marital Status: Married    Spouse Name: N/A  . Number of Children: N/A  . Years of Education: N/A   Social History Main Topics  . Smoking status: Never Smoker   . Smokeless tobacco: Not on file  . Alcohol Use: No  . Drug Use: No  . Sexual Activity: Not on file   Other Topics Concern  . Not on file   Social History Narrative   Family History  Problem Relation Age of Onset  . Diabetes Sister   . Diabetes Brother    Allergies  Allergen Reactions  . Latex Rash   Prior to Admission medications   Medication Sig Start Date End Date Taking? Authorizing Provider  conjugated estrogens (PREMARIN) vaginal cream Place 1 Applicatorful vaginally 2 (two) times a week.   Yes Historical Provider, MD  estradiol (CLIMARA - DOSED IN MG/24 HR) 0.1 mg/24hr patch Place 0.1 mg onto the skin once a week.   Yes Historical Provider, MD  estrogens, conjugated, (PREMARIN) 0.625 MG tablet Take 0.625 mg by mouth daily. Take daily for 21 days then do not take for 7 days.   Yes Historical Provider, MD  naproxen (NAPROSYN) 500 MG tablet Take 1 tablet (500 mg total) by mouth 2 (two) times daily with a meal. 01/23/14  Yes Deepak Advani, MD  Omega 3 1000 MG CAPS Take 1,000 mg by mouth daily.   Yes Historical Provider, MD  OVER THE COUNTER MEDICATION Take 1 tablet by mouth daily.  Herbalife multivitamin   Yes Historical Provider, MD  OVER THE COUNTER MEDICATION Take 1 Dose by mouth daily. Herbalife protein shake   Yes Historical Provider, MD  UNABLE TO FIND Testosterone 4% PLO  Apply peasize amount to inner thigh 2 times a week 01/23/14  Yes Deepak Advani, MD     Positive ROS: All other systems have been reviewed and were otherwise negative with the exception of those mentioned in the HPI and as above.  Physical Exam: General: Alert, no acute distress Cardiovascular: No pedal edema Respiratory: No cyanosis, no use of accessory musculature GI: abdomen soft Skin: No lesions in the area of chief complaint Neurologic: Sensation intact distally Psychiatric: Patient is competent for consent with normal mood and affect Lymphatic: no lymphedema  MUSCULOSKELETAL: exam stable  Assessment: Bilateral knee degenerative joint disease  Plan: Plan for Procedure(s): BILATERAL TOTAL KNEE ARTHROPLASTIES  The risks benefits and alternatives were discussed with the patient including but not limited to the risks of nonoperative treatment, versus surgical intervention including infection, bleeding, nerve injury,  blood clots, cardiopulmonary complications, morbidity, mortality, among others, and they were willing to proceed.   Marianna Payment, MD   04/03/2015 7:19 AM

## 2015-04-03 NOTE — Transfer of Care (Signed)
Immediate Anesthesia Transfer of Care Note  Patient: Engineer, civil (consulting)  Procedure(s) Performed: Procedure(s): RIGHT TOTAL KNEE ARTHROPLASTY (Right) LEFT TOTAL KNEE ARTHROPLASTY (Left)  Patient Location: PACU  Anesthesia Type:General  Level of Consciousness: awake, alert , oriented and patient cooperative  Airway & Oxygen Therapy: Patient Spontanous Breathing  Post-op Assessment: Report given to RN and Post -op Vital signs reviewed and stable  Post vital signs: Reviewed and stable  Last Vitals:  Filed Vitals:   04/03/15 1032  BP: 153/67  Pulse: 63  Temp: [id number] C    Complications: No apparent anesthesia complications

## 2015-04-03 NOTE — Progress Notes (Signed)
Report to R. Mancel Bale as primary caregiver. Epidural dsg is CDI,  Naropin 0.2% infusion on pump at 8cc/hr (16 mg).

## 2015-04-03 NOTE — Anesthesia Postprocedure Evaluation (Signed)
Anesthesia Post Note  Patient: Engineer, civil (consulting)  Procedure(s) Performed: Procedure(s) (LRB): RIGHT TOTAL KNEE ARTHROPLASTY (Right) LEFT TOTAL KNEE ARTHROPLASTY (Left)  Patient location during evaluation: PACU Anesthesia Type: General Level of consciousness: awake and alert Pain management: pain level controlled Vital Signs Assessment: post-procedure vital signs reviewed and stable Respiratory status: spontaneous breathing, nonlabored ventilation and respiratory function stable Cardiovascular status: blood pressure returned to baseline and stable Postop Assessment: no signs of nausea or vomiting Anesthetic complications: no    Last Vitals:  Filed Vitals:   04/03/15 1854 04/03/15 2003  BP: 123/60 111/57  Pulse: 43 44  Temp: 36.1 C 36.3 C  Resp: 16 17    Last Pain:  Filed Vitals:   04/03/15 2006  PainSc: 0-No pain                 Shizuko Wojdyla A

## 2015-04-03 NOTE — Anesthesia Preprocedure Evaluation (Signed)
Anesthesia Evaluation  Patient identified by MRN, date of birth, ID band Patient awake    Reviewed: Allergy & Precautions, NPO status , Patient's Chart, lab work & pertinent test results  History of Anesthesia Complications Negative for: history of anesthetic complications  Airway Mallampati: I  TM Distance: >3 FB Neck ROM: Full    Dental  (+) Teeth Intact   Pulmonary neg pulmonary ROS,    breath sounds clear to auscultation       Cardiovascular negative cardio ROS   Rhythm:Regular     Neuro/Psych  Headaches, neg Seizures PSYCHIATRIC DISORDERS Anxiety    GI/Hepatic negative GI ROS, Neg liver ROS,   Endo/Other    Renal/GU negative Renal ROS     Musculoskeletal  (+) Arthritis ,   Abdominal   Peds  Hematology negative hematology ROS (+)   Anesthesia Other Findings   Reproductive/Obstetrics                             Anesthesia Physical Anesthesia Plan  ASA: II  Anesthesia Plan: Spinal, Epidural and MAC   Post-op Pain Management:    Induction:   Airway Management Planned: Natural Airway, Nasal Cannula and Simple Face Mask  Additional Equipment: None  Intra-op Plan:   Post-operative Plan:   Informed Consent: I have reviewed the patients History and Physical, chart, labs and discussed the procedure including the risks, benefits and alternatives for the proposed anesthesia with the patient or authorized representative who has indicated his/her understanding and acceptance.   Dental advisory given  Plan Discussed with: CRNA and Surgeon  Anesthesia Plan Comments:         Anesthesia Quick Evaluation

## 2015-04-03 NOTE — Discharge Instructions (Signed)

## 2015-04-04 ENCOUNTER — Encounter (HOSPITAL_COMMUNITY): Payer: Self-pay | Admitting: Orthopaedic Surgery

## 2015-04-04 LAB — CBC
HEMATOCRIT: 38.1 % (ref 36.0–46.0)
HEMOGLOBIN: 13 g/dL (ref 12.0–15.0)
MCH: 32.2 pg (ref 26.0–34.0)
MCHC: 34.1 g/dL (ref 30.0–36.0)
MCV: 94.3 fL (ref 78.0–100.0)
Platelets: 184 10*3/uL (ref 150–400)
RBC: 4.04 MIL/uL (ref 3.87–5.11)
RDW: 13.4 % (ref 11.5–15.5)
WBC: 7.6 10*3/uL (ref 4.0–10.5)

## 2015-04-04 LAB — BASIC METABOLIC PANEL
Anion gap: 6 (ref 5–15)
BUN: 7 mg/dL (ref 6–20)
CALCIUM: 8.5 mg/dL — AB (ref 8.9–10.3)
CHLORIDE: 104 mmol/L (ref 101–111)
CO2: 29 mmol/L (ref 22–32)
CREATININE: 0.56 mg/dL (ref 0.44–1.00)
GFR calc non Af Amer: >60 mL/min (ref >60)
Glucose, Bld: 117 mg/dL — ABNORMAL HIGH (ref 65–99)
Potassium: 4.1 mmol/L (ref 3.5–5.1)
SODIUM: 139 mmol/L (ref 135–145)

## 2015-04-04 MED ORDER — HEPARIN SODIUM (PORCINE) 5000 UNIT/ML IJ SOLN: 5000.0000 [IU] | Freq: Three times a day (TID) | INTRAMUSCULAR | Status: DC

## 2015-04-04 MED ORDER — ENOXAPARIN SODIUM 40 MG/0.4ML ~~LOC~~ SOLN
40.0000 mg | SUBCUTANEOUS | Status: DC
  Administered 2015-04-05 – 2015-04-06 (×2): 40 mg via SUBCUTANEOUS
  Filled 2015-04-04: qty 0.4

## 2015-04-04 MED ORDER — ENOXAPARIN SODIUM 40 MG/0.4ML ~~LOC~~ SOLN
40.0000 mg | SUBCUTANEOUS | Status: DC
Start: 2015-04-04 — End: 2015-04-04

## 2015-04-04 MED ORDER — HYDROMORPHONE HCL 1 MG/ML IJ SOLN: 0.5000 mg | INTRAMUSCULAR | Status: DC | PRN

## 2015-04-04 NOTE — Progress Notes (Signed)
Called OR x2 for Mrs Tina Johnson to have her epidermal removed.  They stated that Dr Gershon Mussel had left the hospital hours ago and another anesthesiologist will come up and remove

## 2015-04-04 NOTE — Progress Notes (Signed)
PT Cancellation Note  Patient Details Name: Tina Johnson MRN: [id number] DOB: [birth year]   Cancelled Treatment:    Reason Eval/Treat Not Completed: Medical issues which prohibited therapy.  Pt's epidural turned off an hour ago, awaiting anesthesia to remove epidural from pt's back.  Interpreter arranged for 2pm.  PT to return at 2 pm to initiate evaluation.  Thanks,   Barbarann Ehlers. Jacek Colson, PT, DPT [phone]   04/04/2015, 11:15 AM

## 2015-04-04 NOTE — Progress Notes (Signed)
Anesthesiologist from [facility] came up and remove epidural from the patient. Pt tolerated well. No complaints @ this time.

## 2015-04-04 NOTE — Progress Notes (Signed)
OT Cancellation Note  Patient Details Name: Tina Johnson MRN: [id number] DOB: 1965-07-05   Cancelled Treatment:    Reason Eval/Treat Not Completed: Medical issues which prohibited therapy. Continue to wait for epidural removal. RN advised not to get pt up until epidural removed. OT will check back in AM for eval.   Binnie Kand M.S., OTR/L Pager: [phone]  04/04/2015, 2:47 PM

## 2015-04-04 NOTE — Clinical Social Work Note (Signed)
CSW received referral for SNF.  Case discussed with case manager, and plan is to discharge home with home health.  CSW to sign off please re-consult if social work needs arise.  Tywaun Hiltner R. Micaiah Litle, MSW, LCSWA [phone]  

## 2015-04-04 NOTE — Progress Notes (Signed)
Interpreter Lesle Chris for Tina Mercury RN and Patient

## 2015-04-04 NOTE — Progress Notes (Signed)
Patient is sitting in chair doing PT.  Rates pain as 2.  Doing well.   Foley out in am. Epidural to be pulled out imminently. lovenox to start 6 hrs after epidural is pulled  N. Eduard Roux, MD [place] 4:19 PM

## 2015-04-04 NOTE — Progress Notes (Signed)
Pt heart rate dropping to the 40's according to continuous pulse ox pt is asymptomatic Rapid response was notified and made aware. Dr. Ninfa Linden notified and telemetry order given and pt placed on telemetry Anesthesiologist was called and informed of pt heart rate and that telemetry was ordered MD gave no new orders will continue to monitor. Arthor Captain LPN

## 2015-04-04 NOTE — Progress Notes (Signed)
Oleta Mouse called to D/C Epidural @ 10:15 and he will be up to remove

## 2015-04-04 NOTE — Progress Notes (Signed)
PT Cancellation Note  Patient Details Name: Tina Johnson MRN: [id number] DOB: 1965/12/13   Cancelled Treatment:    Reason Eval/Treat Not Completed: Medical issues which prohibited therapy.  Still waiting on epidural removal.  Consulted with RN about getting pt up with epidural still in and she advised against this plan.  PT will check back in AM.  Thanks,    Wells Guiles B. Nataleigh Griffin, PT, DPT [phone]   04/04/2015, 2:14 PM

## 2015-04-04 NOTE — Evaluation (Signed)
Physical Therapy Evaluation Patient Details Name: Tina Johnson MRN: [id number] DOB: 1965-07-15 Today's Date: 04/04/2015   History of Present Illness  49 y.o. female admitted to [facility] on 04/03/15 for elective bil TKA.  Pt with significant PMHx of anxiety, and bil knee and bil shoulder arthritis.    Clinical Impression  Evaluation completed after RN spoke with PT that MD did want pt up OOB despite epidural still in her back.  Pt did well taking pivotal steps to the recliner chair. Knee exercises and precaution education initiated with pt and her husband.  Pt will likely progress well with her mobility and I anticipate will be able to d/c home with her husband's assist in a few days.  She only has one STE and he can be there with her 24/7.   PT to follow acutely for deficits listed below.       Follow Up Recommendations Home health PT;Supervision for mobility/OOB    Equipment Recommendations  Rolling walker with 5" wheels    Recommendations for Other Services   NA    Precautions / Restrictions Precautions Precautions: Knee Precaution Booklet Issued: Yes (comment) Precaution Comments: knee exercises given (in Spanish) and first three reviewed.  Also, reviewed knee precaution with pt/husband.       Mobility  Bed Mobility Overal bed mobility: Needs Assistance Bed Mobility: Supine to Sit     Supine to sit: Min assist     General bed mobility comments: min assist to help progress bil legs to EOB and then to provide hand held assist to help pull pt up to sitting.   Transfers Overall transfer level: Needs assistance Equipment used: 2 person hand held assist Transfers: Sit to/from [facility] Sit to Stand: +2 physical assistance;Mod assist Stand pivot transfers: +2 physical assistance;Mod assist       General transfer comment: Two person mod assist to support trunk as pt took pivotal steps around to the chair.  Pt was actively weight bearing on her legs and  moving her legs to take steps.  There was no walker in the room and she is so short statured that it would not have fit her anyway.  Therapist provided a youth RW at the end of her session and adjusted it down to her height.          Balance Overall balance assessment: Needs assistance Sitting-balance support: Feet supported;No upper extremity supported Sitting balance-Leahy Scale: Good     Standing balance support: Bilateral upper extremity supported Standing balance-Leahy Scale: Poor                               Pertinent Vitals/Pain Pain Assessment: 0-10 Pain Score: 2  Pain Location: bil knees Pain Descriptors / Indicators: Aching;Burning;Grimacing;Guarding Pain Intervention(s): Limited activity within patient's tolerance;Monitored during session;Premedicated before session;Repositioned    Home Living Family/patient expects to be discharged to:: Private residence Living Arrangements: Spouse/significant other Available Help at Discharge: Family;Available 24 hours/day Type of Home: House Home Access: Stairs to enter Entrance Stairs-Rails: None Entrance Stairs-Number of Steps: 1 Home Layout: One level Home Equipment: None      Prior Function Level of Independence: Independent         Comments: pt does drive, does not work        Extremity/Trunk Assessment   Upper Extremity Assessment: Defer to OT evaluation           Lower Extremity Assessment: RLE deficits/detail;LLE deficits/detail RLE  Deficits / Details: right and left leg with normal post op pain and weakness.  Right is mildly weaker than left and has less ROM.  Pt reports right leg was the worst before surgery.  Ankles at least 3/5, knees 2/5, hips 2+/5.   LLE Deficits / Details: right and left leg with normal post op pain and weakness.  Right is mildly weaker than left and has less ROM.  Pt reports right leg was the worst before surgery.  Ankles at least 3/5, knees 2/5, hips 2+/5.     Cervical / Trunk Assessment: Normal  Communication   Communication: Prefers language other than [place] (Spanish interpreter used [phone])  Cognition Arousal/Alertness: Awake/alert Behavior During Therapy: WFL for tasks assessed/performed Overall Cognitive Status: Within Functional Limits for tasks assessed                         Exercises Total Joint Exercises Ankle Circles/Pumps: AROM;Both;20 reps Quad Sets: AROM;Both;10 reps Heel Slides: AAROM;Both;10 reps Goniometric ROM: R 5-60, L 5-80 AAROM seated in recliner chair.      Assessment/Plan    PT Assessment Patient needs continued PT services  PT Diagnosis Difficulty walking;Abnormality of gait;Generalized weakness;Acute pain   PT Problem List Decreased strength;Decreased range of motion;Decreased activity tolerance;Decreased balance;Decreased mobility;Decreased knowledge of use of DME;Decreased knowledge of precautions;Pain  PT Treatment Interventions DME instruction;Gait training;Functional mobility training;Stair training;Therapeutic activities;Therapeutic exercise;Balance training;Neuromuscular re-education;Patient/family education;Manual techniques;Modalities   PT Goals (Current goals can be found in the Care Plan section) Acute Rehab PT Goals Patient Stated Goal: to get back to exercising and an active lifestyle.  PT Goal Formulation: With patient Time For Goal Achievement: 04/11/15 Potential to Achieve Goals: Good    Frequency 7X/week           End of Session   Activity Tolerance: Patient limited by fatigue;Patient limited by pain Patient left: in chair;with call bell/phone within reach;with family/visitor present Nurse Communication: Mobility status         Time: [id number] PT Time Calculation (min) (ACUTE ONLY): 61 min   Charges:   PT Evaluation $Initial PT Evaluation Tier I: 1 Procedure PT Treatments $Therapeutic Exercise: 23-37 mins $Therapeutic Activity: 8-22 mins        Chelesa Weingartner B.  Izsak Meir, [place], DPT [phone]   04/04/2015, 4:45 PM

## 2015-04-04 NOTE — Progress Notes (Signed)
OT Cancellation Note  Patient Details Name: Tina Johnson MRN: [id number] DOB: February 15, 1966   Cancelled Treatment:    Reason Eval/Treat Not Completed: Medical issues which prohibited therapy. Per PT, awaiting removal of epidural form pts back. PT has arranged interpreter for 2pm; will check back for OT eval then.   Binnie Kand M.S., OTR/L Pager: [phone]   04/04/2015, 11:41 AM

## 2015-04-04 NOTE — Progress Notes (Signed)
   Subjective:  Patient reports pain as none.  Asymptomatic from bradycardia.  HR in the 60s now.  Objective:   VITALS:   Filed Vitals:   04/03/15 2003 04/04/15 0042 04/04/15 0145 04/04/15 0443  BP: 111/57 126/56 97/60 133/69  Pulse: 44 42 45 46  Temp: 97.3 F (36.3 C) 97.6 F (36.4 C)  98.1 F (36.7 C)  TempSrc: Oral Oral  Oral  Resp: 17 16  16   Height:      Weight:      SpO2: 100% 100%  99%    Neurologically intact Neurovascular intact Sensation intact distally Intact pulses distally Dorsiflexion/Plantar flexion intact Incision: dressing C/D/I and no drainage No cellulitis present Compartment soft   Lab Results  Component Value Date   WBC 7.6 04/04/2015   HGB 13.0 04/04/2015   HCT 38.1 04/04/2015   MCV 94.3 04/04/2015   PLT 184 04/04/2015     Assessment/Plan:  1 Day Post-Op   - Expected postop acute blood loss anemia - will monitor for symptoms - Up with PT/OT after epidural is turned off - DVT ppx - SCDs, ambulation, subcutaneous heparin - WBAT bilateral operative extremity - Pain controlled - anesthesia to turn off epidural this morning - needs CPM in house and home - continue foley for continuous epidural - Discharge planning  Tina Johnson 04/04/2015, 7:38 AM [phone]

## 2015-04-05 LAB — CBC
HEMATOCRIT: 36 % (ref 36.0–46.0)
HEMOGLOBIN: 12.3 g/dL (ref 12.0–15.0)
MCH: 31.9 pg (ref 26.0–34.0)
MCHC: 34.2 g/dL (ref 30.0–36.0)
MCV: 93.5 fL (ref 78.0–100.0)
Platelets: 172 10*3/uL (ref 150–400)
RBC: 3.85 MIL/uL — AB (ref 3.87–5.11)
RDW: 13.4 % (ref 11.5–15.5)
WBC: 7.9 10*3/uL (ref 4.0–10.5)

## 2015-04-05 NOTE — Progress Notes (Signed)
Physical Therapy Treatment Patient Details Name: Tina Johnson MRN: [id number] DOB: October 07, 1965 Today's Date: 04/05/2015    History of Present Illness 49 y.o. female admitted to [facility] on 04/03/15 for elective bil TKA.  Pt with significant PMHx of anxiety, and bil knee and bil shoulder arthritis.      PT Comments    Pt is POD #2 and this is her second session. She continues to progress gait distance and required lighter min assist during mobility and gait this PM.  We continued to progress her HEP program and I switched her CPM to her other knee this PM.  Right knee is weaker and has less ROM than left knee.  PT will continue to follow acutely.   Follow Up Recommendations  Home health PT;Supervision for mobility/OOB     Equipment Recommendations  Rolling walker with 5" wheels (youth RW)    Recommendations for Other Services   NA     Precautions / Restrictions Precautions Precautions: Knee Precaution Booklet Issued: Yes (comment) Precaution Comments: knee exercises given (In Spanish) last session and knee precaution reviewed Restrictions Weight Bearing Restrictions: No RLE Weight Bearing: Weight bearing as tolerated LLE Weight Bearing: Weight bearing as tolerated    Mobility  Bed Mobility Overal bed mobility: Needs Assistance Bed Mobility: Supine to Sit;Sit to Supine     Supine to sit: Min assist;HOB elevated Sit to supine: Min assist   General bed mobility comments: Min assist to help progress right more than left leg to EOB. Pt with HOB elevated and using railing for leverage. Min assist to help lift both legs back into the bed when going to supine  Transfers Overall transfer level: Needs assistance Equipment used: Rolling walker (2 wheeled) Transfers: Sit to/from Stand Sit to Stand: Min assist         General transfer comment: Lighter min assist this PM during our session.  Her strength is improving.  She does like to push up on RW with both hands and needs  cues for safe hand placement.   Ambulation/Gait Ambulation/Gait assistance: Min assist;+2 safety/equipment Ambulation Distance (Feet): 95 Feet Assistive device: Rolling walker (2 wheeled) Gait Pattern/deviations: Step-to pattern Gait velocity: decreased Gait velocity interpretation: Below normal speed for age/gender General Gait Details: Pt with better, more fluid step to gait pattern.  Encouraged her to bend knees when going to pick one of her feet up off of the floor (during swing phasse of gait).  Chair to follow again to encourage increased gait distance.           Balance Overall balance assessment: Needs assistance Sitting-balance support: Feet supported;No upper extremity supported Sitting balance-Leahy Scale: Good     Standing balance support: Bilateral upper extremity supported Standing balance-Leahy Scale: Poor                      Cognition Arousal/Alertness: Awake/alert Behavior During Therapy: WFL for tasks assessed/performed Overall Cognitive Status: Within Functional Limits for tasks assessed                      Exercises Total Joint Exercises Short Arc QuadSinclair Ship;Both;10 reps Hip ABduction/ADduction: AAROM;Both;10 reps Straight Leg Raises: AAROM;Both;10 reps Goniometric ROM: R 3-71 degrees AAROM, L 5-85 degrees AAROM    General Comments General comments (skin integrity, edema, etc.): On site interpreter used for this session (different from AM session).       Pertinent Vitals/Pain Pain Assessment: 0-10 Pain Score: 6  Pain Location: bil knee, right  knee more stiff and swollen per pt Pain Descriptors / Indicators: Aching Pain Intervention(s): Limited activity within patient's tolerance;Monitored during session;Premedicated before session;Repositioned           PT Goals (current goals can now be found in the care plan section) Acute Rehab PT Goals Patient Stated Goal: to get back to exercising and an active lifestyle.  Progress  towards PT goals: Progressing toward goals    Frequency  7X/week    PT Plan Current plan remains appropriate       End of Session   Activity Tolerance: Patient limited by pain Patient left: in chair;Other (comment) (with OT and translator to do OT assessment)     Time: 1412-1510 PT Time Calculation (min) (ACUTE ONLY): 58 min  Charges:  $Gait Training: 23-37 mins $Therapeutic Exercise: 23-37 mins                      Danyla Wattley B. Kam Rahimi, [place], DPT [phone]   04/05/2015, 5:29 PM

## 2015-04-05 NOTE — Progress Notes (Signed)
Occupational Therapy Evaluation Patient Details Name: Tina Johnson MRN: [id number] DOB: 12/07/1965 Today's Date: 04/05/2015    History of Present Illness 49 y.o. female admitted to [facility] on 04/03/15 for elective bil TKA.  Pt with significant PMHx of anxiety, and bil knee and bil shoulder arthritis.     Clinical Impression   Patient s/p bilateral TKA.  She presents with decreased independence with ADLs due to pain and weakness. Has 24/7 support from family at home.  Will benefit from acute OT to address deficits listed below in order to improve safety and independence with ADLs prior to return home.    Follow Up Recommendations  No OT follow up;Supervision/Assistance - 24 hour    Equipment Recommendations  Tub/shower seat    Recommendations for Other Services       Precautions / Restrictions Precautions Precautions: Knee Restrictions Weight Bearing Restrictions: No      Mobility Bed Mobility Overal bed mobility:  (not assessed)                Transfers Overall transfer level: Needs assistance Equipment used: Rolling walker (2 wheeled) Transfers: Sit to/from Stand Sit to Stand: Mod assist              Balance                                            ADL Overall ADL's : Needs assistance/impaired Eating/Feeding: Sitting;Independent   Grooming: Set up;Sitting;Wash/dry face;Wash/dry hands   Upper Body Bathing: Set up;Sitting   Lower Body Bathing: Moderate assistance;Sit to/from stand   Upper Body Dressing : Set up;Sitting   Lower Body Dressing: Moderate assistance;Sit to/from stand                 General ADL Comments: Patient able to doff socks while sitting edge of chair but unable to don.      Vision     Perception     Praxis      Pertinent Vitals/Pain Pain Assessment: 0-10 Pain Score: 5  Pain Location: bilateral knees Pain Descriptors / Indicators: Aching Pain Intervention(s): Limited activity  within patient's tolerance;Monitored during session     Hand Dominance     Extremity/Trunk Assessment Upper Extremity Assessment Upper Extremity Assessment: Overall WFL for tasks assessed   Lower Extremity Assessment Lower Extremity Assessment: Defer to PT evaluation       Communication Communication Communication: Prefers language other than [place] (Spanish interpreter used)   Cognition Arousal/Alertness: Awake/alert Behavior During Therapy: WFL for tasks assessed/performed Overall Cognitive Status: Within Functional Limits for tasks assessed                     General Comments       Exercises       Shoulder Instructions      Home Living Family/patient expects to be discharged to:: Private residence Living Arrangements: Spouse/significant other Available Help at Discharge: Family;Available 24 hours/day Type of Home: House Home Access: Stairs to enter [employer] of Steps: 1 Entrance Stairs-Rails: None Home Layout: One level     Bathroom Shower/Tub: Occupational psychologist: Standard     Home Equipment: None          Prior Functioning/Environment Level of Independence: Independent        Comments: pt does drive, does not work    OT Diagnosis: Generalized weakness;Acute  pain   OT Problem List: Decreased strength;Decreased activity tolerance;Impaired balance (sitting and/or standing);Decreased knowledge of use of DME or AE;Decreased knowledge of precautions;Pain   OT Treatment/Interventions: Self-care/ADL training;DME and/or AE instruction;Therapeutic activities;Patient/family education    OT Goals(Current goals can be found in the care plan section) Acute Rehab OT Goals Patient Stated Goal: to get back to exercising and an active lifestyle.  OT Goal Formulation: With patient/family Time For Goal Achievement: 04/12/15 Potential to Achieve Goals: Good  OT Frequency: Min 2X/week   Barriers to D/C:             Co-evaluation              End of Session Equipment Utilized During Treatment: Rolling walker;Gait belt CPM Left Knee CPM Left Knee: Off  Activity Tolerance: Patient tolerated treatment well Patient left: in chair;with call bell/phone within reach;with family/visitor present (with MD present)   Time: [id number] OT Time Calculation (min): 25 min Charges:  OT General Charges $OT Visit: 1 Procedure OT Evaluation $Initial OT Evaluation Tier I: 1 Procedure G-Codes:    Darrol Jump OTR/L 04/05/2015, 11:26 AM

## 2015-04-05 NOTE — Progress Notes (Signed)
   Subjective:  Patient reports pain as mild. Foley removed Lovenox started  Objective:   VITALS:   Filed Vitals:   04/04/15 1449 04/04/15 1546 04/04/15 2108 04/05/15 0555  BP: 121/57 102/57 149/66 132/65  Pulse: 54 45 55 59  Temp: 98 F (36.7 C)  98.2 F (36.8 C) 98.1 F (36.7 C)  TempSrc: Oral  Oral Oral  Resp: 16  16 16   Height:      Weight:      SpO2: 98% 97% 97% 97%    Neurologically intact Neurovascular intact Sensation intact distally Intact pulses distally Dorsiflexion/Plantar flexion intact Incision: dressing C/D/I and no drainage No cellulitis present Compartment soft   Lab Results  Component Value Date   WBC 7.9 04/05/2015   HGB 12.3 04/05/2015   HCT 36.0 04/05/2015   MCV 93.5 04/05/2015   PLT 172 04/05/2015     Assessment/Plan:  2 Days Post-Op   - Expected postop acute blood loss anemia - will monitor for symptoms - Up with PT/OT - HHPT - DVT ppx - SCDs, ambulation, lovenox, TED hose - WBAT bilateral operative extremity - Pain control - Discharge planning - possibly sunday  Marianna Payment 04/05/2015, 10:28 AM [phone]

## 2015-04-05 NOTE — Anesthesia Postprocedure Evaluation (Signed)
Anesthesia Post Note  Patient: Engineer, civil (consulting)  Procedure(s) Performed: Procedure(s) (LRB): RIGHT TOTAL KNEE ARTHROPLASTY (Right) LEFT TOTAL KNEE ARTHROPLASTY (Left)  Patient location during evaluation: PACU Anesthesia Type: Combined Spinal/Epidural Level of consciousness: awake Pain management: pain level controlled Vital Signs Assessment: post-procedure vital signs reviewed and stable Respiratory status: spontaneous breathing Cardiovascular status: stable Postop Assessment: no signs of nausea or vomiting and spinal receding Anesthetic complications: no    Last Vitals:  Filed Vitals:   04/04/15 2108 04/05/15 0555  BP: 149/66 132/65  Pulse: 55 59  Temp: 36.8 C 36.7 C  Resp: 16 16    Last Pain:  Filed Vitals:   04/05/15 0628  PainSc: 7                  Catherine Oak

## 2015-04-05 NOTE — Progress Notes (Signed)
Physical Therapy Treatment Patient Details Name: Tina Johnson MRN: [id number] DOB: 18-Apr-1966 Today's Date: 04/05/2015    History of Present Illness 49 y.o. female admitted to [facility] on 04/03/15 for elective bil TKA.  Pt with significant PMHx of anxiety, and bil knee and bil shoulder arthritis.      PT Comments    Pt is POD #2 and is progressing well with her mobility.  She is better able to support her own weight with the use of the RW and was able to walk into the hallway with chair to follow to encourage increased gait distance.  She continues to progress and require less assist each treatment.    Follow Up Recommendations  Home health PT;Supervision for mobility/OOB     Equipment Recommendations  Rolling walker with 5" wheels (youth RW)    Recommendations for Other Services  NA     Precautions / Restrictions Precautions Precautions: Knee Precaution Booklet Issued: Yes (comment) Precaution Comments: knee exercises given (In Spanish) last session and knee precaution reviewed Restrictions Weight Bearing Restrictions: No RLE Weight Bearing: Weight bearing as tolerated LLE Weight Bearing: Weight bearing as tolerated    Mobility  Bed Mobility Overal bed mobility: Needs Assistance Bed Mobility: Supine to Sit     Supine to sit: Min assist;HOB elevated     General bed mobility comments: Min assist to help progress right more than left leg to EOB. Pt with HOB elevated and using railing for leverage.   Transfers Overall transfer level: Needs assistance Equipment used: Rolling walker (2 wheeled) Transfers: Sit to/from Stand Sit to Stand: Min assist         General transfer comment: Min assist to support trunk during transitions.  Verbal cues for safe hand placement.   Ambulation/Gait Ambulation/Gait assistance: Min assist;+2 safety/equipment Ambulation Distance (Feet): 85 Feet Assistive device: Rolling walker (2 wheeled) Gait Pattern/deviations: Step-to  pattern;Antalgic;Trunk flexed Gait velocity: decreased Gait velocity interpretation: Below normal speed for age/gender General Gait Details: Pt with step to gait pattern, verbal cues needed for upright posture and good foot flat positioning in stance (for increased WB bil feet).  Knees are still mildly flexed, but will work on this as her strength improves.           Balance Overall balance assessment: Needs assistance Sitting-balance support: Feet supported;No upper extremity supported Sitting balance-Leahy Scale: Good     Standing balance support: Bilateral upper extremity supported Standing balance-Leahy Scale: Poor                      Cognition Arousal/Alertness: Awake/alert Behavior During Therapy: WFL for tasks assessed/performed Overall Cognitive Status: Within Functional Limits for tasks assessed                         General Comments General comments (skin integrity, edema, etc.): On site interpreter used today.      Pertinent Vitals/Pain Pain Assessment: 0-10 Pain Score: 2  Pain Location: bil knee Pain Descriptors / Indicators: Aching Pain Intervention(s): Limited activity within patient's tolerance;Monitored during session;Premedicated before session;Repositioned           PT Goals (current goals can now be found in the care plan section) Acute Rehab PT Goals Patient Stated Goal: to get back to exercising and an active lifestyle.  Progress towards PT goals: Progressing toward goals    Frequency  7X/week    PT Plan Current plan remains appropriate       End of  Session   Activity Tolerance: Patient limited by pain Patient left: in chair;Other (comment) (with OT and translator to do OT assessment)     Time: [id number] PT Time Calculation (min) (ACUTE ONLY): 34 min  Charges:  $Gait Training: 23-37 mins                      Dhruvi Crenshaw B. Keyanah Kozicki, [place], DPT [phone]   04/05/2015, 5:22 PM

## 2015-04-05 NOTE — Progress Notes (Signed)
Subjective: 2 Days Post-Op Procedure(s) (LRB): RIGHT TOTAL KNEE ARTHROPLASTY (Right) LEFT TOTAL KNEE ARTHROPLASTY (Left) Patient reports pain as moderate.    Objective: Vital signs in last 24 hours: Temp:  [98 F (36.7 C)-98.2 F (36.8 C)] 98.1 F (36.7 C) (12/17 0555) Pulse Rate:  [45-59] 59 (12/17 0555) Resp:  [16] 16 (12/17 0555) BP: (102-149)/(57-66) 132/65 mmHg (12/17 0555) SpO2:  [97 %-98 %] 97 % (12/17 0555)  Intake/Output from previous day: 12/16 0701 - 12/17 0700 In: 840 [P.O.:840] Out: 3150 [Urine:3150] Intake/Output this shift:     Recent Labs  04/03/15 1052 04/04/15 0602 04/05/15 0538  HGB 14.5 13.0 12.3    Recent Labs  04/04/15 0602 04/05/15 0538  WBC 7.6 7.9  RBC 4.04 3.85*  HCT 38.1 36.0  PLT 184 172    Recent Labs  04/03/15 1052 04/04/15 0602  NA 140 139  K 3.7 4.1  CL 106 104  CO2 26 29  BUN 15 7  CREATININE 0.66 0.56  GLUCOSE 108* 117*  CALCIUM 9.0 8.5*    Recent Labs  04/03/15 1052  INR 1.11    In chair  Dressing dry  Assessment/Plan: 2 Days Post-Op Procedure(s) (LRB): RIGHT TOTAL KNEE ARTHROPLASTY (Right) LEFT TOTAL KNEE ARTHROPLASTY (Left) Up with therapy  Tina Johnson C 04/05/2015, 10:25 AM

## 2015-04-06 MED ORDER — OXYCODONE HCL 5 MG PO TABS: 5.0000 mg | ORAL_TABLET | ORAL | Status: DC | PRN

## 2015-04-06 MED ORDER — ONDANSETRON HCL 4 MG PO TABS: 4.0000 mg | ORAL_TABLET | Freq: Three times a day (TID) | ORAL | Status: DC | PRN

## 2015-04-06 MED ORDER — ENOXAPARIN SODIUM 40 MG/0.4ML ~~LOC~~ SOLN: 40.0000 mg | Freq: Every day | SUBCUTANEOUS | Status: DC

## 2015-04-06 MED ORDER — OXYCODONE HCL ER 10 MG PO T12A: 10.0000 mg | EXTENDED_RELEASE_TABLET | Freq: Two times a day (BID) | ORAL | Status: DC

## 2015-04-06 MED ORDER — RIVAROXABAN 10 MG PO TABS: 10.0000 mg | ORAL_TABLET | Freq: Every day | ORAL | Status: DC

## 2015-04-06 MED ORDER — SENNOSIDES-DOCUSATE SODIUM 8.6-50 MG PO TABS: 1.0000 | ORAL_TABLET | Freq: Every evening | ORAL | Status: DC | PRN

## 2015-04-06 MED ORDER — METHOCARBAMOL 750 MG PO TABS: 750.0000 mg | ORAL_TABLET | Freq: Two times a day (BID) | ORAL | Status: DC | PRN

## 2015-04-06 NOTE — Progress Notes (Addendum)
   Subjective:  Patient reports pain as mild.  Objective:   VITALS:   Filed Vitals:   04/05/15 0555 04/05/15 1545 04/05/15 2045 04/06/15 0522  BP: 132/65 134/64 124/65 140/68  Pulse: 59 58 72 65  Temp: 98.1 F (36.7 C) 98.3 F (36.8 C) 99.9 F (37.7 C) 99.3 F (37.4 C)  TempSrc: Oral  Oral Oral  Resp: 16 16 16 16   Height:      Weight:      SpO2: 97% 97% 96% 98%    Neurologically intact Neurovascular intact Sensation intact distally Intact pulses distally Dorsiflexion/Plantar flexion intact Incision: dressing C/D/I and no drainage No cellulitis present Compartment soft   Lab Results  Component Value Date   WBC 7.9 04/05/2015   HGB 12.3 04/05/2015   HCT 36.0 04/05/2015   MCV 93.5 04/05/2015   PLT 172 04/05/2015     Assessment/Plan:  3 Days Post-Op   - Expected postop acute blood loss anemia - will monitor for symptoms - Up with PT/OT - HHPT - DVT ppx - SCDs, ambulation, lovenox, TED hose - WBAT bilateral operative extremity - asymptomatic bradycardia - curbsided cards who feels that pulse ox monitor is inaccurate. Telemetry has not shown any bradycardia into the 40s.  Telemetry has only shown HR in 50-60s at the lowest.   - dc Sunday if CPM can be set up today for home  Marianna Payment 04/06/2015, 9:52 AM [phone]

## 2015-04-06 NOTE — Care Management Note (Addendum)
Case Management Note  Patient Details  Name: Teofila Minyard MRN: [id number] Date of Birth: 09-05-1965  Subjective/Objective:                  elective bil TKA  Action/Plan: CM spoke to patient and husband at the bedside using the interpreter line and spoke with Shanon Brow [phone]. Cm explained CPM machine to be delivered to the house and they need to call once home for delivery. Number placed on AVS.   CM spoke to patient and family who said they have trouble with some medications and worry about medications at discharge. Patient confirmed that she has insurance. Patient said main medication she is having trouble with is her Premarin which is $300 a month. Cm advised for patient to speak to MD about changing to something else.   Cm called the patient's multiplan carrier @ [phone] and spoke with [place] and priced: Lovenox: $0 for 14 day supply Premarin $300 for 30 day supply Naproxen $7.55 Zofran $0 Oxycodon10mg  BID $19.36 Roxicodon5mg  1-3 tablets every 4 hours $0  CM faxed updated insurance card to [employer] on [employer] @ [phone]. Confirmation received.   Patient said that she would agree to [facility] PT and CM offered choice and patient chose [facility]. CM called Tiffany with [facility] and referral accepted as well as CPM setup accepted and would be delivered today.   Patient and family declined 3N1 and felt it was not necessary.   No further CM needs communicated and CM remains available should additional needs arise.   Expected Discharge Date:  04/06/15               Expected Discharge Plan:  [place]  In-House Referral:     Discharge planning Services  CM Consult  Post Acute Care Choice:    Choice offered to:  Patient, Spouse (Interpreter used to discuss home health needs and DME)  DME Arranged:  Walker rolling, Shower stool, Continuous passive motion machine DME Agency:  [place]:  PT [place]:  [place]  Status of Service:  Completed, signed off  Medicare Important Message Given:    Date Medicare IM Given:    Medicare IM give by:    Date Additional Medicare IM Given:    Additional Medicare Important Message give by:     If discussed at [place] of Stay Meetings, dates discussed:    Additional Comments:  Guido Sander, RN 04/06/2015, 2:15 PM

## 2015-04-06 NOTE — Progress Notes (Signed)
Physical Therapy Treatment Patient Details Name: Tina Johnson MRN: [id number] DOB: 07-15-1965 Today's Date: 04/06/2015    History of Present Illness 49 y.o. female admitted to [facility] on 04/03/15 for elective bil TKA.  Pt with significant PMHx of anxiety, and bil knee and bil shoulder arthritis.      PT Comments    Pt is progressing well with gait and mobility.  She and her husband were able to practice home entry on the stairs today with good success.  She would benefit from reinforcement of technique during her PM session prior to d/c home (planned for today).  I have an interpreter arranged for 1400 and I will be back to see her at that time.   Follow Up Recommendations  Home health PT;Supervision for mobility/OOB     Equipment Recommendations  Rolling walker with 5" wheels (youth RW)    Recommendations for Other Services   NA     Precautions / Restrictions Precautions Precautions: Knee Restrictions RLE Weight Bearing: Weight bearing as tolerated LLE Weight Bearing: Weight bearing as tolerated    Mobility  Bed Mobility Overal bed mobility: Needs Assistance Bed Mobility: Supine to Sit     Supine to sit: Mod assist     General bed mobility comments: Pt was OOB in the chair  Transfers Overall transfer level: Needs assistance Equipment used: Rolling walker (2 wheeled) Transfers: Sit to/from Stand Sit to Stand: Min guard Stand pivot transfers: Min guard       General transfer comment: Min guard assist to support trunk or stabilize RW during transitions.  Husband provided assist today to get a better feel for how he will need to help her at home.   Ambulation/Gait Ambulation/Gait assistance: Min assist Ambulation Distance (Feet): 210 Feet Assistive device: Rolling walker (2 wheeled) Gait Pattern/deviations: Step-to pattern;Antalgic;Trunk flexed Gait velocity: decreased Gait velocity interpretation: <1.8 ft/sec, indicative of risk for recurrent  falls General Gait Details: Verbal cues for heel to toe pattern with increased knee flexion during swing phase of gait.  Chair to follow back from the gym, but pt able to make it all the way back to her room without the chair.    Stairs Stairs: Yes Stairs assistance: Mod assist Stair Management: Backwards;No rails;With walker Number of Stairs: 2 (x2 trials) General stair comments: PT demonstrated reverse stair technique and then pt tried it x 2 with husband and therapist's assist.  We led up with the left and down with the right since yesterday the left was the stronger one with more ROM.           Balance Overall balance assessment: Needs assistance Sitting-balance support: Feet supported;No upper extremity supported Sitting balance-Leahy Scale: Good     Standing balance support: Bilateral upper extremity supported Standing balance-Leahy Scale: Poor                      Cognition Arousal/Alertness: Awake/alert Behavior During Therapy: WFL for tasks assessed/performed Overall Cognitive Status: Within Functional Limits for tasks assessed                      Exercises Total Joint Exercises Long Arc Quad: AROM;Both;10 reps;Seated Knee Flexion: AROM;Both;10 reps;Seated    General Comments General comments (skin integrity, edema, etc.): On site interpreter used.       Pertinent Vitals/Pain Pain Assessment: 0-10 Pain Score: 2  Pain Location: bil knees Pain Descriptors / Indicators: Aching Pain Intervention(s): Limited activity within patient's tolerance;Monitored during session;Premedicated before session;Repositioned  Home Living                          PT Goals (current goals can now be found in the care plan section) Acute Rehab PT Goals Patient Stated Goal: to get back to exercising and an active lifestyle.  Progress towards PT goals: Progressing toward goals    Frequency  7X/week    PT Plan Current plan remains appropriate        End of Session Equipment Utilized During Treatment: Gait belt Activity Tolerance: Patient limited by pain Patient left: in chair;with call bell/phone within reach;with family/visitor present     Time: [id number] PT Time Calculation (min) (ACUTE ONLY): 66 min  Charges:  $Gait Training: 38-52 mins $Therapeutic Exercise: 8-22 mins                      Karel Mowers B. Teonna Coonan, PT, DPT [phone]   04/06/2015, 12:03 PM

## 2015-04-06 NOTE — Progress Notes (Signed)
Occupational Therapy Treatment Patient Details Name: Tina Johnson MRN: [id number] DOB: 16-Dec-1965 Today's Date: 04/06/2015    History of present illness 49 y.o. female admitted to [facility] on 04/03/15 for elective bil TKA.  Pt with significant PMHx of anxiety, and bil knee and bil shoulder arthritis.     OT comments  Pt. Progressing well with acute OT goals.  Able to complete bed mobility with spouse assisting, and toileting and shower stall transfer.  Will continue to follow acutely.    Follow Up Recommendations  No OT follow up;Supervision/Assistance - 24 hour    Equipment Recommendations  Tub/shower seat    Recommendations for Other Services      Precautions / Restrictions Precautions Precautions: Knee Restrictions RLE Weight Bearing: Weight bearing as tolerated LLE Weight Bearing: Weight bearing as tolerated       Mobility Bed Mobility Overal bed mobility: Needs Assistance Bed Mobility: Supine to Sit     Supine to sit: Mod assist     General bed mobility comments: hob flat, no rails, exits from L to simulate home environment. husband provided initial trunk support to transition into sitting, then assisted with guiding b les off of bed, increased time for this as pt. wanted b les moved very slowly  Transfers Overall transfer level: Needs assistance   Transfers: Sit to/from [employer] Sit to Stand: Min guard Stand pivot transfers: Min guard            Balance                                   ADL Overall ADL's : Needs assistance/impaired     Grooming: Wash/dry hands;Min Dispensing optician: Min guard;Ambulation;RW;Regular Toilet   Toileting- Clothing Manipulation and Hygiene: Minimal assistance;Sit to/from stand   Tub/ Banker: Walk-in shower;Anterior/posterior;Ambulation;Rolling walker Tub/Shower Transfer Details (indicate cue type and reason): husband present for  session and able to provide physical assistance in/out of shower stall          Vision                     Perception     Praxis      Cognition   Behavior During Therapy: [facility] for tasks assessed/performed Overall Cognitive Status: Within Functional Limits for tasks assessed                       Extremity/Trunk Assessment               Exercises     Shoulder Instructions       General Comments      Pertinent Vitals/ Pain       Pain Assessment: 0-10 Pain Score: 2  Pain Intervention(s): Repositioned  Home Living                                          Prior Functioning/Environment              Frequency Min 2X/week     Progress Toward Goals  OT Goals(current goals can now be found in the care plan section)  Progress towards OT goals: Progressing toward goals     Plan Discharge plan remains  appropriate    Co-evaluation                 End of Session Equipment Utilized During Treatment: Rolling walker   Activity Tolerance Patient tolerated treatment well   Patient Left in chair;with call bell/phone within reach (md present, translator present, PT present at end of session)   Nurse Communication          Time: [phone] OT Time Calculation (min): 33 min  Charges: OT Treatments $Self Care/Home Management : 23-37 mins  Daiva Huge Lorraine,COTA/L 04/06/2015, 9:49 AM

## 2015-04-06 NOTE — Discharge Summary (Signed)
Physician Discharge Summary      Patient ID: Tina Johnson MRN: [id number] DOB/AGE: 08/27/1965 49 y.o.  Admit date: 04/03/2015 Discharge date: 04/06/2015  Admission Diagnoses:  <principal problem not specified>  Discharge Diagnoses:  Active Problems:   Other bilateral secondary osteoarthritis of knee   Past Medical History  Diagnosis Date  . Anxiety     controls with exercise   . Indigestion     no medicine at present  . Arthritis     knees , shoulders    Surgeries: Procedure(s): RIGHT TOTAL KNEE ARTHROPLASTY LEFT TOTAL KNEE ARTHROPLASTY on 04/03/2015   Consultants (if any):    Discharged Condition: Improved  Hospital Course: Tina Johnson is an 49 y.o. female who was admitted 04/03/2015 with a diagnosis of <principal problem not specified> and went to the operating room on 04/03/2015 and underwent the above named procedures.    She was given perioperative antibiotics:  Anti-infectives    Start     Dose/Rate Route Frequency Ordered Stop   04/03/15 2200  ceFAZolin (ANCEF) IVPB 2 g/50 mL premix     2 g 100 mL/hr over 30 Minutes Intravenous Every 6 hours 04/03/15 1857 04/04/15 0459   04/03/15 1200  ceFAZolin (ANCEF) IVPB 2 g/50 mL premix     2 g 100 mL/hr over 30 Minutes Intravenous To ShortStay Surgical 04/02/15 1405 04/03/15 1537    .  She was given sequential compression devices, early ambulation, and lovenox for DVT prophylaxis.  She benefited maximally from the hospital stay and there were no complications.    Recent vital signs:  Filed Vitals:   04/06/15 0522 04/06/15 1352  BP: 140/68 124/67  Pulse: 65 79  Temp: 99.3 F (37.4 C) 99.2 F (37.3 C)  Resp: 16 16    Recent laboratory studies:  Lab Results  Component Value Date   HGB 12.3 04/05/2015   HGB 13.0 04/04/2015   HGB 14.5 04/03/2015   Lab Results  Component Value Date   WBC 7.9 04/05/2015   PLT 172 04/05/2015   Lab Results  Component Value Date   INR 1.11  04/03/2015   Lab Results  Component Value Date   NA 139 04/04/2015   K 4.1 04/04/2015   CL 104 04/04/2015   CO2 29 04/04/2015   BUN 7 04/04/2015   CREATININE 0.56 04/04/2015   GLUCOSE 117* 04/04/2015    Discharge Medications:     Medication List    TAKE these medications        conjugated estrogens vaginal cream  Commonly known as:  PREMARIN  Place 1 Applicatorful vaginally 2 (two) times a week.     enoxaparin 40 MG/0.4ML injection  Commonly known as:  LOVENOX  Inject 0.4 mLs (40 mg total) into the skin daily.     estradiol 0.1 mg/24hr patch  Commonly known as:  CLIMARA - Dosed in mg/24 hr  Place 0.1 mg onto the skin once a week.     estrogens (conjugated) 0.625 MG tablet  Commonly known as:  PREMARIN  Take 0.625 mg by mouth daily. Take daily for 21 days then do not take for 7 days.     methocarbamol 750 MG tablet  Commonly known as:  ROBAXIN  Take 1 tablet (750 mg total) by mouth 2 (two) times daily as needed for muscle spasms.     naproxen 500 MG tablet  Commonly known as:  NAPROSYN  Take 1 tablet (500 mg total) by mouth 2 (two) times daily with a meal.  Omega 3 1000 MG Caps  Take 1,000 mg by mouth daily.     ondansetron 4 MG tablet  Commonly known as:  ZOFRAN  Take 1-2 tablets (4-8 mg total) by mouth every 8 (eight) hours as needed for nausea or vomiting.     OVER THE COUNTER MEDICATION  Take 1 tablet by mouth daily. Herbalife multivitamin     OVER THE COUNTER MEDICATION  Take 1 Dose by mouth daily. Herbalife protein shake     oxyCODONE 10 mg 12 hr tablet  Commonly known as:  OXYCONTIN  Take 1 tablet (10 mg total) by mouth every 12 (twelve) hours.     oxyCODONE 5 MG immediate release tablet  Commonly known as:  Oxy IR/ROXICODONE  Take 1-3 tablets (5-15 mg total) by mouth every 4 (four) hours as needed.     senna-docusate 8.6-50 MG tablet  Commonly known as:  SENOKOT S  Take 1 tablet by mouth at bedtime as needed.     UNABLE TO FIND    Testosterone 4% PLO  Apply peasize amount to inner thigh 2 times a week        Diagnostic Studies: Dg Knee Left Port  04/03/2015  CLINICAL DATA:  Status post left total knee replacement. EXAM: PORTABLE LEFT KNEE - 1-2 VIEW COMPARISON:  None. FINDINGS: The femoral and tibial components appear to be well situated. No fracture or dislocation is noted. Expected postoperative changes are noted in anterior soft tissues. IMPRESSION: Status post left total knee arthroplasty. Electronically Signed   By: Marijo Conception, M.D.   On: 04/03/2015 18:28   Dg Knee Right Port  04/03/2015  CLINICAL DATA:  Status post right total knee replacement. EXAM: PORTABLE RIGHT KNEE - 1-2 VIEW COMPARISON:  January 23, 2014. FINDINGS: The femoral and tibial components appear to be well situated. No fracture or dislocation is noted. Expected postoperative changes are noted anteriorly in the soft tissues. IMPRESSION: Status post right total knee arthroplasty. Electronically Signed   By: Marijo Conception, M.D.   On: 04/03/2015 18:29    Disposition: 01-Home or Self Care      Discharge Instructions    Call MD / Call 911    Complete by:  As directed   If you experience chest pain or shortness of breath, CALL 911 and be transported to the hospital emergency room.  If you develope a fever above 101.5 F, pus (white drainage) or increased drainage or redness at the wound, or calf pain, call your surgeon's office.     Constipation Prevention    Complete by:  As directed   Drink plenty of fluids.  Prune juice may be helpful.  You may use a stool softener, such as Colace (over the counter) 100 mg twice a day.  Use MiraLax (over the counter) for constipation as needed.     Diet - low sodium heart healthy    Complete by:  As directed      Diet general    Complete by:  As directed      Driving restrictions    Complete by:  As directed   No driving while taking narcotic pain meds.     Increase activity slowly as tolerated     Complete by:  As directed            Follow-up Information    Follow up with Marianna Payment, MD In 2 weeks.   Specialty:  Orthopedic Surgery   Why:  For suture removal, For  wound re-check   Contact information:   [place] [address] [phone]       Follow up with [facility].   Why:  Home health for physical therapy; they will call you within 1-2 days to arrange first visit   Contact information:   [address] [place]  [address] [phone]       Follow up with [facility].   Why:  To deliver CPM machine-please call the number above once home.    Contact information:   [address] [place] [address] [phone]       Follow up with Primary Care Physician.   Why:  Call number on your insurance card to find Primary Care Physician for follow up.       Signed: Marianna Payment 04/06/2015, 5:38 PM

## 2015-04-06 NOTE — Progress Notes (Signed)
Physical Therapy Treatment Patient Details Name: Tina Johnson MRN: [id number] DOB: November 30, 1965 Today's Date: 04/06/2015    History of Present Illness 49 y.o. female admitted to [facility] on 04/03/15 for elective bil TKA.  Pt with significant PMHx of anxiety, and bil knee and bil shoulder arthritis.      PT Comments    Pt was able to practice stairs this PM, although assist level was more as she was in more pain than AM session.  She has not asked for any pain medication since the morning.  Husband assisted pt physically throughout the session and showed safe guarding techniques for bed mobility, gait, and transfers.  The plan is for them to d/c home later today.  Extensive time spent discussing different aspects of managing at the house and how home therapy works.   Follow Up Recommendations  Home health PT;Supervision for mobility/OOB     Equipment Recommendations  Rolling walker with 5" wheels (youth)    Recommendations for Other Services   NA     Precautions / Restrictions Precautions Precautions: Knee Restrictions RLE Weight Bearing: Weight bearing as tolerated LLE Weight Bearing: Weight bearing as tolerated    Mobility  Bed Mobility Overal bed mobility: Needs Assistance Bed Mobility: Supine to Sit     Supine to sit: Mod assist (HOB flat, no rail, husband assisting)     General bed mobility comments: Husband assisting with transfer.  Due to pain, pt needed more significant help at both legs and trunk to get to sitting.  Hand held assist provided for pt to pull up at the same time husband was assisting bil legs over EOB.   Transfers Overall transfer level: Needs assistance Equipment used: Rolling walker (2 wheeled) Transfers: Sit to/from Stand Sit to Stand: Min assist         General transfer comment: Min assist to support trunk during transitions. Verbal cues for safe hand placement.  Husband assisting with transfers for practice.  Pt requiring more assist due  to pain.   Ambulation/Gait Ambulation/Gait assistance: Min assist Ambulation Distance (Feet): 250 Feet Assistive device: Rolling walker (2 wheeled) Gait Pattern/deviations: Step-to pattern;Antalgic Gait velocity: decreased Gait velocity interpretation: <1.8 ft/sec, indicative of risk for recurrent falls General Gait Details: verbal cues for heel to toe gait pattern, pt more circumduction gait due to tightness in bil knees and pain.    Stairs Stairs: Yes Stairs assistance: Min assist Stair Management: Backwards;No rails;Step to pattern;With walker Number of Stairs: 2 General stair comments: PT reinforced stair education from earlier today and had translator write down the LE sequencing that would be the safest at this time.  Husband assisting on stairs with cues from PT.          Balance Overall balance assessment: Needs assistance Sitting-balance support: Feet supported;No upper extremity supported Sitting balance-Leahy Scale: Good     Standing balance support: Bilateral upper extremity supported Standing balance-Leahy Scale: Poor                      Cognition Arousal/Alertness: Awake/alert Behavior During Therapy: WFL for tasks assessed/performed Overall Cognitive Status: Within Functional Limits for tasks assessed                      Exercises Total Joint Exercises Ankle Circles/Pumps: AROM;Both;20 reps Quad Sets: AROM;Both;10 reps Towel Squeeze: AROM;Both;10 reps Heel Slides: AAROM;Both;10 reps Long Arc Quad: AROM;Both;10 reps;Seated Knee Flexion: AROM;Both;10 reps;Seated Goniometric ROM: R- 0-75, L -0-85    General  Comments General comments (skin integrity, edema, etc.): On site interpreter used (different from AM interpreter).      Pertinent Vitals/Pain Pain Assessment: Faces Pain Score: 2  Faces Pain Scale: Hurts even more Pain Location: bil knees Pain Descriptors / Indicators: Aching Pain Intervention(s): Limited activity within  patient's tolerance;Monitored during session;Repositioned;Patient requesting pain meds-RN notified           PT Goals (current goals can now be found in the care plan section) Acute Rehab PT Goals Patient Stated Goal: to get back to exercising and an active lifestyle.  Progress towards PT goals: Progressing toward goals    Frequency  7X/week    PT Plan Current plan remains appropriate       End of Session Equipment Utilized During Treatment: Gait belt Activity Tolerance: Patient limited by fatigue;Patient limited by pain Patient left: in chair;with call bell/phone within reach;with family/visitor present     Time: [id number] PT Time Calculation (min) (ACUTE ONLY): 75 min  Charges:  $Gait Training: 23-37 mins $Therapeutic Exercise: 23-37 mins $Self Care/Home Management: 8-22                      Lexine Jaspers B. Kyandre Okray, PT, DPT [phone]   04/06/2015, 3:27 PM

## 2015-04-25 ENCOUNTER — Ambulatory Visit: Payer: Self-pay | Attending: Orthopedic Surgery | Admitting: Physical Therapy

## 2015-04-25 DIAGNOSIS — M25669 Stiffness of unspecified knee, not elsewhere classified: Secondary | ICD-10-CM

## 2015-04-25 DIAGNOSIS — M25561 Pain in right knee: Secondary | ICD-10-CM | POA: Insufficient documentation

## 2015-04-25 DIAGNOSIS — M25562 Pain in left knee: Secondary | ICD-10-CM | POA: Insufficient documentation

## 2015-04-25 DIAGNOSIS — R269 Unspecified abnormalities of gait and mobility: Secondary | ICD-10-CM | POA: Insufficient documentation

## 2015-04-25 DIAGNOSIS — M25662 Stiffness of left knee, not elsewhere classified: Secondary | ICD-10-CM | POA: Insufficient documentation

## 2015-04-25 DIAGNOSIS — M24669 Ankylosis, unspecified knee: Secondary | ICD-10-CM | POA: Insufficient documentation

## 2015-04-25 DIAGNOSIS — M25661 Stiffness of right knee, not elsewhere classified: Secondary | ICD-10-CM | POA: Insufficient documentation

## 2015-04-25 DIAGNOSIS — R600 Localized edema: Secondary | ICD-10-CM | POA: Insufficient documentation

## 2015-04-25 NOTE — Therapy (Signed)
[place], [place], [address] Phone: [phone]   Fax:  [phone]  Physical Therapy Evaluation  Patient Details  Name: Tina Johnson MRN: [id number] Date of Birth: [birth year] Referring Provider: Dr Meridee Score / Dr. Erlinda Hong  Encounter Date: 04/25/2015      PT End of Session - 04/25/15 0915    Visit Number 1   Number of Visits 24   Date for PT Re-Evaluation 06/20/15   Authorization Type Self-Pay   PT Start Time 0830   PT Stop Time 0911   PT Time Calculation (min) 41 min   Activity Tolerance Patient tolerated treatment well   Behavior During Therapy [facility] for tasks assessed/performed      Past Medical History  Diagnosis Date  . Anxiety     controls with exercise   . Indigestion     no medicine at present  . Arthritis     knees , shoulders    Past Surgical History  Procedure Laterality Date  . Abdominal hysterectomy    . Total abdominal hysterectomy w/ bilateral salpingoophorectomy    . Vaginal delivery      ?x2  . Total knee arthroplasty Right 04/03/2015    Procedure: RIGHT TOTAL KNEE ARTHROPLASTY;  Surgeon: Leandrew Koyanagi, MD;  Location: [place];  Service: Orthopedics;  Laterality: Right;  . Total knee arthroplasty Left 04/03/2015    Procedure: LEFT TOTAL KNEE ARTHROPLASTY;  Surgeon: Leandrew Koyanagi, MD;  Location: [place];  Service: Orthopedics;  Laterality: Left;    There were no vitals filed for this visit.  Visit Diagnosis:  Decreased range of motion (ROM) of knee - Plan: PT plan of care cert/re-cert  Arthralgia of both knees - Plan: PT plan of care cert/re-cert  Knee stiffness, left - Plan: PT plan of care cert/re-cert  Knee stiffness, right - Plan: PT plan of care cert/re-cert  Abnormality of gait - Plan: PT plan of care cert/re-cert      Subjective Assessment - 04/25/15 0837    Subjective Pt is a 50 y/o female who presents to [facility] for bil TKA on 04/03/15.  Pt presents to OPPT  with difficulty with ADLs, mobility and independence.     Patient is accompained by: Family member;Interpreter  husband   Pertinent History anxiety   Limitations Standing;Walking;House hold activities;Sitting   How long can you sit comfortably? 1-2 hours   How long can you stand comfortably? 10 min   How long can you walk comfortably? 30 min   Patient Stated Goals return to baseline; walk normally   Currently in Pain? Yes   Pain Score 3    Pain Location Knee   Pain Orientation Right;Left   Pain Descriptors / Indicators Aching;Dull   Pain Type Surgical pain   Pain Onset 1 to 4 weeks ago   Pain Frequency Constant   Aggravating Factors  bending, standing, walking   Pain Relieving Factors medication, repositioning            [facility] PT Assessment - 04/25/15 0843    Assessment   Medical Diagnosis bil TKA   Referring Provider Dr Meridee Score / Dr. Erlinda Hong   Onset Date/Surgical Date 04/03/15   Next MD Visit 05/15/15   Prior Therapy HHPT   Precautions   Precautions None   Restrictions   Weight Bearing Restrictions No   Balance Screen   Has the patient fallen in the past 6 months No   Has the patient had a decrease  in activity level because of a fear of falling?  No   Is the patient reluctant to leave their home because of a fear of falling?  No   Home Environment   Living Environment Private residence   Living Arrangements Spouse/significant other   Available Help at Discharge Family   Type of [place] to enter   Entrance Stairs-Number of Steps 3   Entrance Stairs-Rails Can reach both   [place] to live on main level with bedroom/bathroom   Prior Function   Level of Independence Independent   Vocation Unemployed;Works at home   Leisure watch TV; visit with friends; exercise   Observation/Other Assessments   Focus on Therapeutic Outcomes (FOTO)  40 (60% limited; predicted 44% limited)    Functional Tests   Functional tests Other   Other:   Other/  Comments ~ 20 degree extensor lag bil   AROM   AROM Assessment Site Knee   Right/Left Knee Right;Left   Right Knee Extension 4   Right Knee Flexion 65   Left Knee Extension 0   Left Knee Flexion 75   PROM   PROM Assessment Site Knee   Right/Left Knee Right;Left   Right Knee Extension 0   Right Knee Flexion 73   Left Knee Flexion 90   Strength   Strength Assessment Site Hip;Knee;Ankle   Right Hip Flexion 3/5   Right Hip Extension 3/5   Right Hip ABduction 4/5   Right Hip ADduction 3-/5   Left Hip Flexion 3/5   Left Hip Extension 3/5   Left Hip ABduction 4/5   Left Hip ADduction 3/5   Right/Left Knee Right;Left   Right Knee Flexion 2/5   Right Knee Extension 2-/5   Left Knee Flexion 3-/5   Left Knee Extension 2-/5   Right Ankle Dorsiflexion 4/5   Left Ankle Dorsiflexion 3+/5   Palpation   Patella mobility decreased patella mobility bil   Palpation comment bil knee swelling noted   Ambulation/Gait   Assistive device Rolling walker   Gait Pattern Step-to pattern;Decreased hip/knee flexion - right;Decreased hip/knee flexion - left;Right circumduction;Left circumduction                           PT Education - 04/25/15 0915    Education provided Yes   Education Details continue current HEP   Person(s) Educated Patient;Spouse   Methods Explanation   Comprehension Verbalized understanding          PT Short Term Goals - 04/25/15 0918    PT SHORT TERM GOAL #1   Title independent with HEP (05/23/15)   Time 4   Period Weeks   Status New   PT SHORT TERM GOAL #2   Title improve bil knee AROM 0-90 for improved function and mobility (05/23/15)   Time 4   Period Weeks   Status New   PT SHORT TERM GOAL #3   Title ambulate > 150' with single point cane for improved function and mobility    Time 4   Period Weeks   Status New   PT SHORT TERM GOAL #4   Title report ability to stand > 20 min for improved strength and function    Time 4   Period Weeks    Status New           PT Long Term Goals - 04/25/15 0920    PT LONG TERM GOAL #1  Title bil knee AROM 0-105 for improved functional mobility (06/20/15)   Time 8   Period Weeks   Status New   PT LONG TERM GOAL #2   Title negotiate at least 3 stairs reciprocally without AD or rail for improved strength and function   Time 8   Period Weeks   Status New   PT LONG TERM GOAL #3   Title report ability to walk > 30 min without increase in pain for improved function    Time 8   Period Weeks   Status New   PT LONG TERM GOAL #4   Title demonstrate 10 reps SLR bil without extensor lag for improved strength   Time 8   Period Weeks   Status New               Plan - 04/25/15 0916    Clinical Impression Statement Pt is a 50 y/o female who presents to [facility] s/p bil TKA on 04/03/15.  Pt presents today with decreased strength and ROM affecting standing tolerance and functional mobility.  Pt will benefit from PT to maximize function and decrease pain.   Pt will benefit from skilled therapeutic intervention in order to improve on the following deficits Abnormal gait;Decreased strength;Decreased mobility;Increased edema;Pain;Decreased activity tolerance;Decreased endurance;Decreased range of motion;Difficulty walking;Decreased balance   Rehab Potential Good   PT Frequency 3x / week   PT Duration 8 weeks   PT Treatment/Interventions ADLs/Self Care Home Management;Electrical Stimulation;Cryotherapy;Moist Heat;Therapeutic exercise;Therapeutic activities;Functional mobility training;Stair training;Patient/family education;Gait training;Ultrasound;DME Instruction;Balance training;Neuromuscular re-education;Manual techniques;Vasopneumatic Device;Passive range of motion;Scar mobilization   PT Next Visit Plan aggressive ROM; quad strengthening and overall LE strengthening   Consulted and Agree with Plan of Care Patient;Family member/caregiver   Family Member Consulted husband         Problem  List Patient Active Problem List   Diagnosis Date Noted  . Other bilateral secondary osteoarthritis of knee 04/03/2015  . Abnormality of gait 04/26/2014  . 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 Family history of diabetes mellitus (DM) 01/23/2014  . Right knee DJD 01/23/2014  . Hyperlipidemia 01/23/2014  . HYPERLIPIDEMIA 08/14/2007  . DEPRESSION 08/14/2007  . GERD 08/14/2007  . OSTEOARTHRITIS 08/14/2007  . HEADACHE, CHRONIC 08/14/2007   Laureen Abrahams, PT, DPT 04/25/2015 9:39 AM  [facility] [address] [place], [place], [address] Phone: [phone]   Fax:  [phone]  Name: Tina Johnson MRN: [id number] Date of Birth: 12-Jul-1965

## 2015-04-28 ENCOUNTER — Ambulatory Visit: Payer: Self-pay | Admitting: Physical Therapy

## 2015-04-29 ENCOUNTER — Ambulatory Visit: Payer: Self-pay | Admitting: Physical Therapy

## 2015-04-29 DIAGNOSIS — M25661 Stiffness of right knee, not elsewhere classified: Secondary | ICD-10-CM

## 2015-04-29 DIAGNOSIS — M25669 Stiffness of unspecified knee, not elsewhere classified: Secondary | ICD-10-CM

## 2015-04-29 DIAGNOSIS — M25561 Pain in right knee: Secondary | ICD-10-CM

## 2015-04-29 DIAGNOSIS — M25562 Pain in left knee: Secondary | ICD-10-CM

## 2015-04-29 DIAGNOSIS — R269 Unspecified abnormalities of gait and mobility: Secondary | ICD-10-CM

## 2015-04-29 DIAGNOSIS — M25662 Stiffness of left knee, not elsewhere classified: Secondary | ICD-10-CM

## 2015-04-29 NOTE — Therapy (Signed)
[place], [place], [address] Phone: [phone]   Fax:  [phone]  Physical Therapy Treatment  Patient Details  Name: Tina Johnson MRN: [id number] Date of Birth: [birth year] Referring Provider: Dr Meridee Score / Dr. Erlinda Hong  Encounter Date: 04/29/2015      PT End of Session - 04/29/15 1321    Visit Number 2   Number of Visits 24   Date for PT Re-Evaluation 06/20/15   Authorization Type Self-Pay   PT Start Time 1100   PT Stop Time 1200   PT Time Calculation (min) 60 min      Past Medical History  Diagnosis Date  . Anxiety     controls with exercise   . Indigestion     no medicine at present  . Arthritis     knees , shoulders    Past Surgical History  Procedure Laterality Date  . Abdominal hysterectomy    . Total abdominal hysterectomy w/ bilateral salpingoophorectomy    . Vaginal delivery      ?x2  . Total knee arthroplasty Right 04/03/2015    Procedure: RIGHT TOTAL KNEE ARTHROPLASTY;  Surgeon: Leandrew Koyanagi, MD;  Location: [place];  Johnson: Orthopedics;  Laterality: Right;  . Total knee arthroplasty Left 04/03/2015    Procedure: LEFT TOTAL KNEE ARTHROPLASTY;  Surgeon: Leandrew Koyanagi, MD;  Location: [place];  Johnson: Orthopedics;  Laterality: Left;    There were no vitals filed for this visit.  Visit Diagnosis:  No diagnosis found.          [facility] PT Assessment - 04/29/15 1314    AROM   Right Knee Flexion 72  AAROM   Left Knee Flexion 95  AAROM                     [facility] Adult PT Treatment/Exercise - 04/29/15 1315    Knee/Hip Exercises: Stretches   Knee: Self-Stretch to increase Flexion 3 reps;10 seconds   Knee: Self-Stretch Limitations pulling knee to chest and allow heel to hang for stretch- more difficult on right- family to assist at home.    Knee/Hip Exercises: Seated   Heel Slides 10 reps   Heel Slides Limitations on pillow case, then knee flexion stretch  using opposite foot to sustain stretch 10sec x3 each   Knee/Hip Exercises: Supine   Quad Sets Both;20 reps   Quad Sets Limitations max tactile and verbal cues to elicit quad contraction- poor contraction felt prior to end of treatment.    Short Arc [employer]   Short Arc Quad Sets Limitations unable to lift independently -family has been helping her with this. Instructed to assist with concentric and have patient try to hold and lower independently- able to perform fairly well on left.    Modalities   Modalities Cryotherapy   Cryotherapy   Number Minutes Cryotherapy 10 Minutes   Cryotherapy Location Knee  bilateral   Type of Cryotherapy Ice pack                  PT Short Term Goals - 04/25/15 0918    PT SHORT TERM GOAL #1   Title independent with HEP (05/23/15)   Time 4   Period Weeks   Status New   PT SHORT TERM GOAL #2   Title improve bil knee AROM 0-90 for improved function and mobility (05/23/15)   Time 4   Period Weeks   Status New   PT  SHORT TERM GOAL #3   Title ambulate > 150' with single point cane for improved function and mobility    Time 4   Period Weeks   Status New   PT SHORT TERM GOAL #4   Title report ability to stand > 20 min for improved strength and function    Time 4   Period Weeks   Status New           PT Long Term Goals - 04/25/15 0920    PT LONG TERM GOAL #1   Title bil knee AROM 0-105 for improved functional mobility (06/20/15)   Time 8   Period Weeks   Status New   PT LONG TERM GOAL #2   Title negotiate at least 3 stairs reciprocally without AD or rail for improved strength and function   Time 8   Period Weeks   Status New   PT LONG TERM GOAL #3   Title report ability to walk > 30 min without increase in pain for improved function    Time 8   Period Weeks   Status New   PT LONG TERM GOAL #4   Title demonstrate 10 reps SLR bil without extensor lag for improved strength   Time 8   Period Weeks   Status New                Plan - 04/29/15 1322    Clinical Impression Statement Pt enters with RW. Review of current HEP including quad sets, SLR, AAROM knee flexion. Pt demonstrates poor quad contraction. Max verbal and tactile cues to correctly fire quads. She requires assist to achive full knee extension for SAQ, LAQ. Encouraged pt to perform quad sets and AAROM every 2 hours as tolerated.    PT Next Visit Plan aggressive ROM; quad strengthening and overall LE strengthening, try Nustep next visit?        Problem List Patient Active Problem List   Diagnosis Date Noted  . Other bilateral secondary osteoarthritis of knee 04/03/2015  . Abnormality of gait 04/26/2014  . 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 Family history of diabetes mellitus (DM) 01/23/2014  . Right knee DJD 01/23/2014  . Hyperlipidemia 01/23/2014  . HYPERLIPIDEMIA 08/14/2007  . DEPRESSION 08/14/2007  . GERD 08/14/2007  . OSTEOARTHRITIS 08/14/2007  . HEADACHE, CHRONIC 08/14/2007    Dorene Ar, PTA 04/29/2015, 1:27 PM  [place], [place], [address] Phone: [phone]   Fax:  [phone]  Name: Tina Johnson MRN: [id number] Date of Birth: [birth year]

## 2015-04-30 ENCOUNTER — Ambulatory Visit: Payer: Self-pay | Admitting: Physical Therapy

## 2015-04-30 ENCOUNTER — Ambulatory Visit: Payer: No Typology Code available for payment source | Admitting: Physical Therapy

## 2015-04-30 DIAGNOSIS — M25562 Pain in left knee: Secondary | ICD-10-CM

## 2015-04-30 DIAGNOSIS — M25669 Stiffness of unspecified knee, not elsewhere classified: Secondary | ICD-10-CM

## 2015-04-30 DIAGNOSIS — M25662 Stiffness of left knee, not elsewhere classified: Secondary | ICD-10-CM

## 2015-04-30 DIAGNOSIS — R269 Unspecified abnormalities of gait and mobility: Secondary | ICD-10-CM

## 2015-04-30 DIAGNOSIS — M25561 Pain in right knee: Secondary | ICD-10-CM

## 2015-04-30 DIAGNOSIS — M25661 Stiffness of right knee, not elsewhere classified: Secondary | ICD-10-CM

## 2015-04-30 NOTE — Therapy (Signed)
[place], [place], [address] Phone: [phone]   Fax:  [phone]  Physical Therapy Treatment  Patient Details  Name: Tina Johnson MRN: [id number] Date of Birth: 16-Feb-1966 Referring Provider: Dr Meridee Score / Dr. Erlinda Hong  Encounter Date: 04/30/2015      PT End of Session - 04/30/15 1636    Visit Number 3   Number of Visits 24   Date for PT Re-Evaluation 06/20/15   Authorization Type Self-Pay   PT Start Time 0220   PT Stop Time 0320   PT Time Calculation (min) 60 min      Past Medical History  Diagnosis Date  . Anxiety     controls with exercise   . Indigestion     no medicine at present  . Arthritis     knees , shoulders    Past Surgical History  Procedure Laterality Date  . Abdominal hysterectomy    . Total abdominal hysterectomy w/ bilateral salpingoophorectomy    . Vaginal delivery      ?x2  . Total knee arthroplasty Right 04/03/2015    Procedure: RIGHT TOTAL KNEE ARTHROPLASTY;  Surgeon: Leandrew Koyanagi, MD;  Location: [place];  Service: Orthopedics;  Laterality: Right;  . Total knee arthroplasty Left 04/03/2015    Procedure: LEFT TOTAL KNEE ARTHROPLASTY;  Surgeon: Leandrew Koyanagi, MD;  Location: [place];  Service: Orthopedics;  Laterality: Left;    There were no vitals filed for this visit.  Visit Diagnosis:  Decreased range of motion (ROM) of knee  Arthralgia of both knees  Knee stiffness, left  Knee stiffness, right  Abnormality of gait      Subjective Assessment - 04/30/15 1430    Subjective I felt good with what I did here with you yesterday.  I took my pain medicine.    Currently in Pain? Yes   Pain Score 2    Pain Location Knee   Pain Orientation Right;Left   Pain Descriptors / Indicators Aching   Pain Type Surgical pain            [facility] PT Assessment - 04/30/15 1432    AROM   Right Knee Flexion 77  AAROM   Left Knee Flexion 100  AAROM                      [facility] Adult PT Treatment/Exercise - 04/30/15 1432    Ambulation/Gait   Assistive device Rolling walker   Gait Pattern Step-to pattern;Decreased hip/knee flexion - right;Decreased hip/knee flexion - left;Right circumduction;Left circumduction   Gait Comments cues to flex knees and hips with gait,  Also cues to ambulate with neutral hips / ankles- she tends to invert/ IR to avoid flexing knees    Knee/Hip Exercises: Aerobic   Nustep L1 UE/LE x 8 min   Knee/Hip Exercises: Supine   Quad Sets Both;20 reps   Quad Sets Limitations less cues reqd- improved contraction   Short Arc [employer] Sets AAROM;AROM   Short Arc Quad Sets Limitations improved concentrics- needs assist to achieve full extension- worked on lowering independently   Heel Slides 10 reps   Heel Slides Limitations 15 sec holds with sheet    Cryotherapy   Number Minutes Cryotherapy 15 Minutes   Cryotherapy Location Knee  bilateral   Type of Cryotherapy Ice pack   Manual Therapy   Manual Therapy Joint mobilization   Joint Mobilization gentle knee flexion and extension mobs in knee flexion as  well as patella mobs all planes bilateral                  PT Short Term Goals - 04/30/15 1635    PT SHORT TERM GOAL #1   Title independent with HEP (05/23/15)   Time 4   Period Weeks   Status Achieved   PT SHORT TERM GOAL #2   Title improve bil knee AROM 0-90 for improved function and mobility (05/23/15)   Time 4   Period Weeks   Status Partially Met   PT SHORT TERM GOAL #3   Title ambulate > 150' with single point cane for improved function and mobility    Time 4   Period Weeks   Status On-going   PT SHORT TERM GOAL #4   Title report ability to stand > 20 min for improved strength and function    Time 4   Period Weeks   Status On-going           PT Long Term Goals - 04/30/15 1636    PT LONG TERM GOAL #1   Title bil knee AROM 0-105 for improved functional mobility (06/20/15)   Time 8    Period Weeks   Status On-going   PT LONG TERM GOAL #2   Title negotiate at least 3 stairs reciprocally without AD or rail for improved strength and function   Time 8   Period Weeks   Status On-going   PT LONG TERM GOAL #3   Title report ability to walk > 30 min without increase in pain for improved function    Time 8   Period Weeks   Status On-going   PT LONG TERM GOAL #4   Title demonstrate 10 reps SLR bil without extensor lag for improved strength   Time 8   Period Weeks   Status On-going               Plan - 04/30/15 1608    Clinical Impression Statement Cues provided for proper gait mechanics to encourage knee/hip flexion and neutral hip/knee alignment. Pt ambulating with RW and step to gait patteran. Began Nustep with good tolerance for 8  minutes. Review of HEP issued HEP with pt demonstrating improved AROM and improved quad contraction/setting.    PT Next Visit Plan aggressive ROM; quad strengthening and overall LE strengthening, continue Nustep and mobs/ manual for knee flexion-        Problem List Patient Active Problem List   Diagnosis Date Noted  . Other bilateral secondary osteoarthritis of knee 04/03/2015  . Abnormality of gait 04/26/2014  . 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 Family history of diabetes mellitus (DM) 01/23/2014  . Right knee DJD 01/23/2014  . Hyperlipidemia 01/23/2014  . HYPERLIPIDEMIA 08/14/2007  . DEPRESSION 08/14/2007  . GERD 08/14/2007  . OSTEOARTHRITIS 08/14/2007  . HEADACHE, CHRONIC 08/14/2007    Dorene Ar, PTA 04/30/2015, 4:38 PM  [facility] [address] [place], [place], [address] Phone: [phone]   Fax:  [phone]  Name: Tina Johnson MRN: [id number] Date of Birth: [birth year]

## 2015-05-02 ENCOUNTER — Ambulatory Visit: Payer: Self-pay | Admitting: Physical Therapy

## 2015-05-02 DIAGNOSIS — R269 Unspecified abnormalities of gait and mobility: Secondary | ICD-10-CM

## 2015-05-02 DIAGNOSIS — M25669 Stiffness of unspecified knee, not elsewhere classified: Secondary | ICD-10-CM

## 2015-05-02 DIAGNOSIS — M25561 Pain in right knee: Secondary | ICD-10-CM

## 2015-05-02 DIAGNOSIS — M25562 Pain in left knee: Secondary | ICD-10-CM

## 2015-05-02 DIAGNOSIS — M25661 Stiffness of right knee, not elsewhere classified: Secondary | ICD-10-CM

## 2015-05-02 DIAGNOSIS — M25662 Stiffness of left knee, not elsewhere classified: Secondary | ICD-10-CM

## 2015-05-02 NOTE — Therapy (Signed)
[place], [place], [address] Phone: [phone]   Fax:  [phone]  Physical Therapy Treatment  Patient Details  Name: Tina Johnson MRN: [id number] Date of Birth: 01/27/1966 Referring Provider: Dr Meridee Score / Dr. Erlinda Hong  Encounter Date: 05/02/2015      PT End of Session - 05/02/15 1208    Visit Number 4   Number of Visits 24   Date for PT Re-Evaluation 06/20/15   Authorization Type Self-Pay   PT Start Time 0845   PT Stop Time 0945   PT Time Calculation (min) 60 min      Past Medical History  Diagnosis Date  . Anxiety     controls with exercise   . Indigestion     no medicine at present  . Arthritis     knees , shoulders    Past Surgical History  Procedure Laterality Date  . Abdominal hysterectomy    . Total abdominal hysterectomy w/ bilateral salpingoophorectomy    . Vaginal delivery      ?x2  . Total knee arthroplasty Right 04/03/2015    Procedure: RIGHT TOTAL KNEE ARTHROPLASTY;  Surgeon: Leandrew Koyanagi, MD;  Location: [place];  Service: Orthopedics;  Laterality: Right;  . Total knee arthroplasty Left 04/03/2015    Procedure: LEFT TOTAL KNEE ARTHROPLASTY;  Surgeon: Leandrew Koyanagi, MD;  Location: [place];  Service: Orthopedics;  Laterality: Left;    There were no vitals filed for this visit.  Visit Diagnosis:  Decreased range of motion (ROM) of knee  Arthralgia of both knees  Knee stiffness, left  Knee stiffness, right  Abnormality of gait          [facility] PT Assessment - 05/02/15 0001    AROM   Right Knee Flexion 75  75   PROM   Right Knee Flexion 80   Left Knee Flexion 105                     [facility] Adult PT Treatment/Exercise - 05/02/15 0903    Ambulation/Gait   Assistive device Rolling walker   Gait Pattern Step-to pattern;Decreased hip/knee flexion - right;Decreased hip/knee flexion - left;Right circumduction;Left circumduction   Gait Comments cues  to flex knees and hips with gait,  Also cues to ambulate with neutral hips / ankles- she tends to invert/ IR to avoid flexing knees    Knee/Hip Exercises: Aerobic   Nustep L1 UE/LE x 8 min  cues for quad contraction   Knee/Hip Exercises: Supine   Heel Slides 10 reps   Heel Slides Limitations 15 sec holds with sheet    Straight Leg Raises 15 reps   Straight Leg Raises Limitations bilateral with -15 lag bilateral- encouragement to maintain extension with low SLR and hold 5 sec   Cryotherapy   Number Minutes Cryotherapy 15 Minutes   Cryotherapy Location Knee  bilateral   Type of Cryotherapy Ice pack   Manual Therapy   Manual Therapy Joint mobilization;Passive ROM   Joint Mobilization gentle knee flexion and extension mobs in knee flexion as well as patella mobs all planes bilateral   Passive ROM 3 x 30 seconds for flexion                  PT Short Term Goals - 04/30/15 1635    PT SHORT TERM GOAL #1   Title independent with HEP (05/23/15)   Time 4   Period Weeks   Status Achieved  PT SHORT TERM GOAL #2   Title improve bil knee AROM 0-90 for improved function and mobility (05/23/15)   Time 4   Period Weeks   Status Partially Met   PT SHORT TERM GOAL #3   Title ambulate > 150' with single point cane for improved function and mobility    Time 4   Period Weeks   Status On-going   PT SHORT TERM GOAL #4   Title report ability to stand > 20 min for improved strength and function    Time 4   Period Weeks   Status On-going           PT Long Term Goals - 04/30/15 1636    PT LONG TERM GOAL #1   Title bil knee AROM 0-105 for improved functional mobility (06/20/15)   Time 8   Period Weeks   Status On-going   PT LONG TERM GOAL #2   Title negotiate at least 3 stairs reciprocally without AD or rail for improved strength and function   Time 8   Period Weeks   Status On-going   PT LONG TERM GOAL #3   Title report ability to walk > 30 min without increase in pain for  improved function    Time 8   Period Weeks   Status On-going   PT LONG TERM GOAL #4   Title demonstrate 10 reps SLR bil without extensor lag for improved strength   Time 8   Period Weeks   Status On-going               Plan - 05/02/15 0931    Clinical Impression Statement Pt presents with improved ROM left knee however demonstrates continued left knee stiffness at ~80 PROM. Manual techniques were the focus of this session. Pt was able to perform independent SLRs for the first time and demonstrates -15 degree quad lag bilateral.    PT Next Visit Plan aggressive ROM; quad strengthening and overall LE strengthening, continue Nustep and mobs/ manual for knee flexion-        Problem List Patient Active Problem List   Diagnosis Date Noted  . Other bilateral secondary osteoarthritis of knee 04/03/2015  . Abnormality of gait 04/26/2014  . 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 Family history of diabetes mellitus (DM) 01/23/2014  . Right knee DJD 01/23/2014  . Hyperlipidemia 01/23/2014  . HYPERLIPIDEMIA 08/14/2007  . DEPRESSION 08/14/2007  . GERD 08/14/2007  . OSTEOARTHRITIS 08/14/2007  . HEADACHE, CHRONIC 08/14/2007    Dorene Ar, PTA 05/02/2015, 12:11 PM  [facility] [address] [place], [place], [address] Phone: [phone]   Fax:  [phone]  Name: Tina Johnson MRN: [id number] Date of Birth: 1966/01/04

## 2015-05-05 ENCOUNTER — Ambulatory Visit: Payer: Self-pay | Admitting: Physical Therapy

## 2015-05-05 DIAGNOSIS — M25561 Pain in right knee: Secondary | ICD-10-CM

## 2015-05-05 DIAGNOSIS — M25661 Stiffness of right knee, not elsewhere classified: Secondary | ICD-10-CM

## 2015-05-05 DIAGNOSIS — R269 Unspecified abnormalities of gait and mobility: Secondary | ICD-10-CM

## 2015-05-05 DIAGNOSIS — M25669 Stiffness of unspecified knee, not elsewhere classified: Secondary | ICD-10-CM

## 2015-05-05 DIAGNOSIS — M25562 Pain in left knee: Secondary | ICD-10-CM

## 2015-05-05 DIAGNOSIS — M25662 Stiffness of left knee, not elsewhere classified: Secondary | ICD-10-CM

## 2015-05-06 NOTE — Therapy (Signed)
[place], [place], [address] Phone: [phone]   Fax:  [phone]  Physical Therapy Treatment  Patient Details  Name: Tina Johnson MRN: [id number] Date of Birth: 06-Aug-1965 Referring Provider: Dr Meridee Score / Dr. Erlinda Hong  Encounter Date: 05/05/2015      PT End of Session - 05/05/15 0836    Visit Number 5   Number of Visits 24   Date for PT Re-Evaluation 06/20/15   Authorization Type Self-Pay   PT Start Time 0215   PT Stop Time 0315   PT Time Calculation (min) 60 min      Past Medical History  Diagnosis Date  . Anxiety     controls with exercise   . Indigestion     no medicine at present  . Arthritis     knees , shoulders    Past Surgical History  Procedure Laterality Date  . Abdominal hysterectomy    . Total abdominal hysterectomy w/ bilateral salpingoophorectomy    . Vaginal delivery      ?x2  . Total knee arthroplasty Right 04/03/2015    Procedure: RIGHT TOTAL KNEE ARTHROPLASTY;  Surgeon: Leandrew Koyanagi, MD;  Location: [facility];  Service: Orthopedics;  Laterality: Right;  . Total knee arthroplasty Left 04/03/2015    Procedure: LEFT TOTAL KNEE ARTHROPLASTY;  Surgeon: Leandrew Koyanagi, MD;  Location: [facility];  Service: Orthopedics;  Laterality: Left;    There were no vitals filed for this visit.  Visit Diagnosis:  No diagnosis found.          [facility] PT Assessment - 05/05/15 1505    AROM   Right Knee Flexion 85  AAROM                     [facility] Adult PT Treatment/Exercise - 05/05/15 1526    Knee/Hip Exercises: Aerobic   Nustep L3 UE/LE x 5 min   Knee/Hip Exercises: Seated   Long Arc Quad 10 reps  bilateral   Long Arc Quad Limitations assist required for full extension-unable to hold independently   Knee/Hip Exercises: Supine   Quad Sets Both;20 reps     Standing: 3 way hip x 10 each bilateral standing at counter, also hamstring curls x 10 each Bilateral heel  raises x 15  Bilateral knee ice packs x 15 minutes           PT Short Term Goals - 04/30/15 1635    PT SHORT TERM GOAL #1   Title independent with HEP (05/23/15)   Time 4   Period Weeks   Status Achieved   PT SHORT TERM GOAL #2   Title improve bil knee AROM 0-90 for improved function and mobility (05/23/15)   Time 4   Period Weeks   Status Partially Met   PT SHORT TERM GOAL #3   Title ambulate > 150' with single point cane for improved function and mobility    Time 4   Period Weeks   Status On-going   PT SHORT TERM GOAL #4   Title report ability to stand > 20 min for improved strength and function    Time 4   Period Weeks   Status On-going           PT Long Term Goals - 04/30/15 1636    PT LONG TERM GOAL #1   Title bil knee AROM 0-105 for improved functional mobility (06/20/15)   Time 8   Period Weeks  Status On-going   PT LONG TERM GOAL #2   Title negotiate at least 3 stairs reciprocally without AD or rail for improved strength and function   Time 8   Period Weeks   Status On-going   PT LONG TERM GOAL #3   Title report ability to walk > 30 min without increase in pain for improved function    Time 8   Period Weeks   Status On-going   PT LONG TERM GOAL #4   Title demonstrate 10 reps SLR bil without extensor lag for improved strength   Time 8   Period Weeks   Status On-going               Plan - 05/05/15 0837    Clinical Impression Statement Focused today on right knee ROM with manual and soft tissue work. AAROM to 85 post treatment. Pt may benefit from longer treatment times and is receptive to this idea to maximize outcomes.   PT Next Visit Plan aggressive ROM-focusing right. Quad strength- try russian? Begin mini squats Continue Nustep, Bike when ROM reaches 90.         Problem List Patient Active Problem List   Diagnosis Date Noted  . Other bilateral secondary osteoarthritis of knee 04/03/2015  . Abnormality of gait 04/26/2014  . Left  knee DJD 03/07/2014  . 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 Family history of diabetes mellitus (DM) 01/23/2014  . Right knee DJD 01/23/2014  . Hyperlipidemia 01/23/2014  . HYPERLIPIDEMIA 08/14/2007  . DEPRESSION 08/14/2007  . GERD 08/14/2007  . OSTEOARTHRITIS 08/14/2007  . HEADACHE, CHRONIC 08/14/2007    Dorene Ar, PTA 05/06/2015, 8:41 AM  [place], [place], [address] Phone: [phone]   Fax:  [phone]  Name: Tina Johnson MRN: [id number] Date of Birth: April 22, 1965

## 2015-05-07 ENCOUNTER — Encounter: Payer: Self-pay | Admitting: Physical Therapy

## 2015-05-07 ENCOUNTER — Ambulatory Visit: Payer: Self-pay | Admitting: Physical Therapy

## 2015-05-07 DIAGNOSIS — M25561 Pain in right knee: Secondary | ICD-10-CM

## 2015-05-07 DIAGNOSIS — M25661 Stiffness of right knee, not elsewhere classified: Secondary | ICD-10-CM

## 2015-05-07 DIAGNOSIS — R269 Unspecified abnormalities of gait and mobility: Secondary | ICD-10-CM

## 2015-05-07 DIAGNOSIS — M25662 Stiffness of left knee, not elsewhere classified: Secondary | ICD-10-CM

## 2015-05-07 DIAGNOSIS — M25562 Pain in left knee: Secondary | ICD-10-CM

## 2015-05-07 DIAGNOSIS — M25669 Stiffness of unspecified knee, not elsewhere classified: Secondary | ICD-10-CM

## 2015-05-07 NOTE — Patient Instructions (Addendum)
Hamstring Step 2    Pie izquierdo relajado, rodilla extendida, la otra pierna flexionada, pie apoyado. Eleve la pierna extendida mas arriba que la otra al mximo rango de [place]. Sostenga _treinta__ segundos. Relaje la pierna completamente hacia abajo. Repita _tres__ veces.  Copyright  VHI. All rights reserved.  SIT TO STAND: No Device    Sit with feet shoulder-width apart, on floor. Lean chest forward, raise hips up from surface. Straighten hips and knees. Weight bear equally on left and right sides. __10_ reps per set, __2_ sets per day, 7___ days per week Place left leg closer to sitting surface.  Copyright  VHI. All rights reserved.

## 2015-05-07 NOTE — Therapy (Signed)
[place], [place], [address] Phone: [phone]   Fax:  [phone]  Physical Therapy Treatment  Patient Details  Name: Kristyana Notte MRN: [id number] Date of Birth: [birth year] Referring Provider: Dr Meridee Score / Dr. Erlinda Hong  Encounter Date: 05/07/2015      PT End of Session - 05/07/15 1426    Visit Number 6   Number of Visits 24   Date for PT Re-Evaluation 06/20/15   Authorization Type Self-Pay   PT Start Time 0217   PT Stop Time 0400   PT Time Calculation (min) 103 min      Past Medical History  Diagnosis Date  . Anxiety     controls with exercise   . Indigestion     no medicine at present  . Arthritis     knees , shoulders    Past Surgical History  Procedure Laterality Date  . Abdominal hysterectomy    . Total abdominal hysterectomy w/ bilateral salpingoophorectomy    . Vaginal delivery      ?x2  . Total knee arthroplasty Right 04/03/2015    Procedure: RIGHT TOTAL KNEE ARTHROPLASTY;  Surgeon: Leandrew Koyanagi, MD;  Location: [facility];  Service: Orthopedics;  Laterality: Right;  . Total knee arthroplasty Left 04/03/2015    Procedure: LEFT TOTAL KNEE ARTHROPLASTY;  Surgeon: Leandrew Koyanagi, MD;  Location: [facility];  Service: Orthopedics;  Laterality: Left;    There were no vitals filed for this visit.  Visit Diagnosis:  Decreased range of motion (ROM) of knee  Arthralgia of both knees  Knee stiffness, left  Knee stiffness, right  Abnormality of gait          [facility] PT Assessment - 05/07/15 1516    AROM   Right Knee Flexion 88   Left Knee Flexion 100   PROM   Right Knee Flexion 95   Left Knee Flexion 107                     [facility] Adult PT Treatment/Exercise - 05/07/15 1431    Ambulation/Gait   Ambulation/Gait Yes   Ambulation/Gait Assistance 5: Supervision   Assistive device Straight cane   Gait Pattern Step-through pattern;Decreased hip/knee flexion -  right;Decreased hip/knee flexion - left   Ambulation Surface Level   Gait Comments cues for increased knee/hip flexion with gait as well as neutral hips   Knee/Hip Exercises: Stretches   Active Hamstring Stretch 3 reps;30 seconds   Active Hamstring Stretch Limitations with strap   Knee/Hip Exercises: Aerobic   Recumbent Bike Full revolutions x 5 minutes    Nustep L1 x 7 min   Knee/Hip Exercises: Standing   Heel Raises 15 reps   Knee Flexion 20 reps   Knee Flexion Limitations bilateral   Functional Squat 10 reps   Functional Squat Limitations mini squat   Other Standing Knee Exercises sit-stand with and without UE x 10 each cues for neutral hips   Other Standing Knee Exercises 3 way hip at counter x 10 eachx 10 bilateral   Knee/Hip Exercises: Seated   Long Arc Quad 10 reps   Long Arc Quad Limitations 2 sets each- no assist required today   Cryotherapy   Number Minutes Cryotherapy 15 Minutes   Cryotherapy Location Knee  bilateral   Type of Cryotherapy Ice pack   Manual Therapy   Manual Therapy Soft tissue mobilization   Joint Mobilization gentle knee flexion and mobs in knee flexion as  well as patella mobs all planes bilateral   Soft tissue mobilization rock tool used for bilateral quads   Passive ROM 3 x 30 seconds for flexion                PT Education - 05/07/15 1549    Education provided Yes   Education Details hamstring stretch, sit-stand   Person(s) Educated Patient   Methods Explanation;Handout   Comprehension Verbalized understanding          PT Short Term Goals - 04/30/15 1635    PT SHORT TERM GOAL #1   Title independent with HEP (05/23/15)   Time 4   Period Weeks   Status Achieved   PT SHORT TERM GOAL #2   Title improve bil knee AROM 0-90 for improved function and mobility (05/23/15)   Time 4   Period Weeks   Status Partially Met   PT SHORT TERM GOAL #3   Title ambulate > 150' with single point cane for improved function and mobility    Time 4    Period Weeks   Status On-going   PT SHORT TERM GOAL #4   Title report ability to stand > 20 min for improved strength and function    Time 4   Period Weeks   Status On-going           PT Long Term Goals - 04/30/15 1636    PT LONG TERM GOAL #1   Title bil knee AROM 0-105 for improved functional mobility (06/20/15)   Time 8   Period Weeks   Status On-going   PT LONG TERM GOAL #2   Title negotiate at least 3 stairs reciprocally without AD or rail for improved strength and function   Time 8   Period Weeks   Status On-going   PT LONG TERM GOAL #3   Title report ability to walk > 30 min without increase in pain for improved function    Time 8   Period Weeks   Status On-going   PT LONG TERM GOAL #4   Title demonstrate 10 reps SLR bil without extensor lag for improved strength   Time 8   Period Weeks   Status On-going               Plan - 05/07/15 1553    Clinical Impression Statement 90 minute treatment. ROM improving each session. Able to begin bike for ROM. Will begin shirt bouts of bike at home. Also trial of gait with SPC, pt to get one today and practice in home for short periods and increase slowly. Pt and husband instructed in scar massage and plan to purchase vitami E oil. Good progress.    PT Next Visit Plan aggressive ROM-focusing right. Quad strength- try russian? Begin mini squats Continue Nustep, Bike when ROM reaches 90.         Problem List Patient Active Problem List   Diagnosis Date Noted  . Other bilateral secondary osteoarthritis of knee 04/03/2015  . Abnormality of gait 04/26/2014  . 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 Family history of diabetes mellitus (DM) 01/23/2014  . Right knee DJD 01/23/2014  . Hyperlipidemia 01/23/2014  . HYPERLIPIDEMIA 08/14/2007  . DEPRESSION 08/14/2007  . GERD 08/14/2007  . OSTEOARTHRITIS 08/14/2007  . HEADACHE, CHRONIC 08/14/2007    Dorene Ar,  PTA 05/07/2015, 4:55 PM  [place], [place], [address] Phone:  [phone]   Fax:  [phone]  Name: Kamalani Mastro MRN: [id number] Date of Birth: 02-10-1966

## 2015-05-09 ENCOUNTER — Ambulatory Visit: Payer: Self-pay | Admitting: Physical Therapy

## 2015-05-09 DIAGNOSIS — M25661 Stiffness of right knee, not elsewhere classified: Secondary | ICD-10-CM

## 2015-05-09 DIAGNOSIS — M25562 Pain in left knee: Secondary | ICD-10-CM

## 2015-05-09 DIAGNOSIS — M25669 Stiffness of unspecified knee, not elsewhere classified: Secondary | ICD-10-CM

## 2015-05-09 DIAGNOSIS — M25561 Pain in right knee: Secondary | ICD-10-CM

## 2015-05-09 DIAGNOSIS — M25662 Stiffness of left knee, not elsewhere classified: Secondary | ICD-10-CM

## 2015-05-09 DIAGNOSIS — R269 Unspecified abnormalities of gait and mobility: Secondary | ICD-10-CM

## 2015-05-09 NOTE — Therapy (Signed)
[place], [place], [address] Phone: [phone]   Fax:  [phone]  Physical Therapy Treatment  Patient Details  Name: Tina Johnson MRN: [id number] Date of Birth: July 08, 1965 Referring Provider: Dr Meridee Score / Dr. Erlinda Hong  Encounter Date: 05/09/2015      PT End of Session - 05/09/15 1037    Visit Number 7   Number of Visits 24   Date for PT Re-Evaluation 06/20/15   Authorization Type Self-Pay   PT Start Time 1018   PT Stop Time 1210   PT Time Calculation (min) 112 min      Past Medical History  Diagnosis Date  . Anxiety     controls with exercise   . Indigestion     no medicine at present  . Arthritis     knees , shoulders    Past Surgical History  Procedure Laterality Date  . Abdominal hysterectomy    . Total abdominal hysterectomy w/ bilateral salpingoophorectomy    . Vaginal delivery      ?x2  . Total knee arthroplasty Right 04/03/2015    Procedure: RIGHT TOTAL KNEE ARTHROPLASTY;  Surgeon: Leandrew Koyanagi, MD;  Location: [facility];  Service: Orthopedics;  Laterality: Right;  . Total knee arthroplasty Left 04/03/2015    Procedure: LEFT TOTAL KNEE ARTHROPLASTY;  Surgeon: Leandrew Koyanagi, MD;  Location: [facility];  Service: Orthopedics;  Laterality: Left;    There were no vitals filed for this visit.  Visit Diagnosis:  Decreased range of motion (ROM) of knee  Arthralgia of both knees  Knee stiffness, left  Knee stiffness, right  Abnormality of gait      Subjective Assessment - 05/09/15 1151    Currently in Pain? No/denies   Aggravating Factors  flexion exercises   Pain Relieving Factors medication, repositioning            [facility] PT Assessment - 05/09/15 0001    AROM   Right Knee Flexion 90   Left Knee Flexion 105   PROM   Right Knee Flexion 96   Left Knee Flexion 110                     [facility] Adult PT Treatment/Exercise - 05/09/15 0001    Knee/Hip  Exercises: Stretches   Active Hamstring Stretch 2 reps;30 seconds   Active Hamstring Stretch Limitations seated with foot on stool   Knee: Self-Stretch to increase Flexion 3 reps;30 seconds   Knee: Self-Stretch Limitations Prone, passive on right, pt used strap for left   Knee/Hip Exercises: Aerobic   Recumbent Bike Full revolutions x 7 minutes    Knee/Hip Exercises: Standing   Other Standing Knee Exercises sit-stand with and without UE x 10 each cues for neutral hips   Knee/Hip Exercises: Seated   Long Arc Quad 10 reps   Long Arc Quad Limitations assist required on right to [employer] full extension   Hamstring Curl 10 reps   Hamstring Limitations each with red band   Knee/Hip Exercises: Supine   Quad Sets Limitations 5 minutes with [place] Stim bilateral with heel props on towels   Short Arc [employer] Limitations 5 minutes with [place] stil bilateral   Straight Leg Raises Limitations 3 minutes each with [place] Stim- quad lag present right > left   Other Supine Knee/Hip Exercises Pilates Reformer used for bilateral and sinlge le foot work on heel and toes in parallel- 2 red springs , also bridge with  assist to not move carriage - all springs except green   Modalities   Modalities Electrical Stimulation   Cryotherapy   Number Minutes Cryotherapy 15 Minutes   Cryotherapy Location Knee  bilateral   Type of Cryotherapy Ice pack   Electrical Stimulation   Electrical Stimulation Location Bilateral quads during quad sets, SAQ, SLR   Electrical Stimulation Action Forensic scientist Parameters 23 ma 10/10 cycle   Electrical Stimulation Goals Strength   Manual Therapy   Passive ROM 3 x 30 seconds for flexion                  PT Short Term Goals - 05/09/15 1149    PT SHORT TERM GOAL #1   Title independent with HEP (05/23/15)   Time 4   Period Weeks   Status Achieved   PT SHORT TERM GOAL #2   Title improve bil knee AROM 0-90 for improved function and mobility  (05/23/15)   Time 4   Period Weeks   Status Achieved   PT SHORT TERM GOAL #3   Title ambulate > 150' with single point cane for improved function and mobility    Time 4   Period Weeks   Status On-going   PT SHORT TERM GOAL #4   Title report ability to stand > 20 min for improved strength and function    Baseline with RW   Time 4   Period Weeks   Status Achieved           PT Long Term Goals - 04/30/15 1636    PT LONG TERM GOAL #1   Title bil knee AROM 0-105 for improved functional mobility (06/20/15)   Time 8   Period Weeks   Status On-going   PT LONG TERM GOAL #2   Title negotiate at least 3 stairs reciprocally without AD or rail for improved strength and function   Time 8   Period Weeks   Status On-going   PT LONG TERM GOAL #3   Title report ability to walk > 30 min without increase in pain for improved function    Time 8   Period Weeks   Status On-going   PT LONG TERM GOAL #4   Title demonstrate 10 reps SLR bil without extensor lag for improved strength   Time 8   Period Weeks   Status On-going               Plan - 05/09/15 1142    Clinical Impression Statement Pt is 5 weeks S/P bilateral TKA. 90 minute treatment today. Pilates Reformed used for ROM and strength. Russian stim today for bilateral quad strength. Began gentle prone passive knee flexion as tolerated. Pt reports ambulating 30 minutes around home with RW and 1x per day practicing with [place]. STG# 2,4 met.  Pt achieved 90 degrees AROM for right knee flexion today. Left knee flexion AROM 105.    PT Next Visit Plan continue ROM for bilateral knees R>L. Continue [place] for quad strength, Bike for ROM, Nustep for strength, add hip flexor stretch next visit and mobs as tolerated- right more painful.         Problem List Patient Active Problem List   Diagnosis Date Noted  . Other bilateral secondary osteoarthritis of knee 04/03/2015  . Abnormality of gait 04/26/2014  . 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 Family history of diabetes mellitus (DM) 01/23/2014  . Right  knee DJD 01/23/2014  . Hyperlipidemia 01/23/2014  . HYPERLIPIDEMIA 08/14/2007  . DEPRESSION 08/14/2007  . GERD 08/14/2007  . OSTEOARTHRITIS 08/14/2007  . HEADACHE, CHRONIC 08/14/2007    Dorene Ar, PTA 05/09/2015, 12:09 PM  [place], [place], [address] Phone: [phone]   Fax:  [phone]  Name: Tina Johnson MRN: [id number] Date of Birth: 1966-03-12

## 2015-05-12 ENCOUNTER — Ambulatory Visit: Payer: Self-pay | Admitting: Physical Therapy

## 2015-05-12 ENCOUNTER — Encounter: Payer: No Typology Code available for payment source | Admitting: Physical Therapy

## 2015-05-12 DIAGNOSIS — M25562 Pain in left knee: Secondary | ICD-10-CM

## 2015-05-12 DIAGNOSIS — M25669 Stiffness of unspecified knee, not elsewhere classified: Secondary | ICD-10-CM

## 2015-05-12 DIAGNOSIS — M25662 Stiffness of left knee, not elsewhere classified: Secondary | ICD-10-CM

## 2015-05-12 DIAGNOSIS — M25561 Pain in right knee: Secondary | ICD-10-CM

## 2015-05-12 DIAGNOSIS — M25661 Stiffness of right knee, not elsewhere classified: Secondary | ICD-10-CM

## 2015-05-12 DIAGNOSIS — R269 Unspecified abnormalities of gait and mobility: Secondary | ICD-10-CM

## 2015-05-12 NOTE — Therapy (Signed)
[place], [place], [address] Phone: [phone]   Fax:  [phone]  Physical Therapy Treatment  Patient Details  Name: Tina Johnson MRN: [id number] Date of Birth: [birth year] Referring Provider: Dr Meridee Score / Dr. Erlinda Hong  Encounter Date: 05/12/2015      PT End of Session - 05/12/15 1529    Visit Number 8   Number of Visits 24   Date for PT Re-Evaluation 06/20/15   Authorization Type Self-Pay   PT Start Time 0130   PT Stop Time 0330   PT Time Calculation (min) 120 min   Activity Tolerance Patient tolerated treatment well   Behavior During Therapy [facility] for tasks assessed/performed      Past Medical History  Diagnosis Date  . Anxiety     controls with exercise   . Indigestion     no medicine at present  . Arthritis     knees , shoulders    Past Surgical History  Procedure Laterality Date  . Abdominal hysterectomy    . Total abdominal hysterectomy w/ bilateral salpingoophorectomy    . Vaginal delivery      ?x2  . Total knee arthroplasty Right 04/03/2015    Procedure: RIGHT TOTAL KNEE ARTHROPLASTY;  Surgeon: Leandrew Koyanagi, MD;  Location: [facility];  Service: Orthopedics;  Laterality: Right;  . Total knee arthroplasty Left 04/03/2015    Procedure: LEFT TOTAL KNEE ARTHROPLASTY;  Surgeon: Leandrew Koyanagi, MD;  Location: [facility];  Service: Orthopedics;  Laterality: Left;    There were no vitals filed for this visit.  Visit Diagnosis:  Decreased range of motion (ROM) of knee  Arthralgia of both knees  Knee stiffness, left  Knee stiffness, right  Abnormality of gait          [facility] PT Assessment - 05/12/15 1430    AROM   Right Knee Flexion 95   Left Knee Flexion 108   PROM   Right Knee Flexion 98   Left Knee Flexion 112                     [facility] Adult PT Treatment/Exercise - 05/12/15 1338    Ambulation/Gait   Ambulation/Gait --   Ambulation/Gait Assistance --   Assistive device --   Gait Pattern --   Pre-Gait Activities standing at wall with UE and wt shift for bilateraly for entire kinetic chain activation   Gait Comments --   Knee/Hip Exercises: Stretches   Active Hamstring Stretch --   Active Hamstring Stretch Limitations --   Other Knee/Hip Stretches prone with knee flexion to pt tolerance for 15 sec hold x 3 bilaterally,  Prone hang for 5 min with no weights and towell under bilateral thighs for comfort for 5 min   Other Knee/Hip Stretches PROM knee flex and extension x 20 bilaterally to pt tolerance for maximizing range   Knee/Hip Exercises: Aerobic   Recumbent Bike Full revolutions x 5 minutes    Nustep L1 x 7 min   Knee/Hip Exercises: Standing   Heel Raises 15 reps   Knee Flexion 20 reps   Knee Flexion Limitations bilateral   Terminal Knee Extension Limitations  10 x R and then L with Red T band with husband instructed in how to execute at home   Functional Squat 10 reps   Functional Squat Limitations mini squat  at counter   Wall Squat 1 set;10 reps;10 seconds  at 60 degrees   Other Standing  Knee Exercises Wall UE flexion and stepping with glut activation with R UE and then L UE for Right LE and then Left LE x 10 each   Other Standing Knee Exercises --   Knee/Hip Exercises: Seated   Long Arc Quad 10 reps   Long Arc Quad Limitations 1 sets each- no assist required today   Heel Slides Both;10 reps  utilize with tape and VC for maintaining contaceofheatonseat   Knee/Hip Flexion using walker for leveraged, knee flexion to pt tolerance x 10 with 8 9 and 10 rep for 15 to 30 seconds.    Knee/Hip Exercises: Supine   Quad Sets Limitations  5 min bilaterally with heel props   Short Arc Quad Sets Limitations 5 minutes with Russian stim bilaterally   Straight Leg Raises Limitations 5 minutes each with [place] Stim- quad lag present right > left   Modalities   Modalities Electrical Stimulation   Cryotherapy   Number Minutes Cryotherapy 15  Minutes   Cryotherapy Location Knee  bilateral   Type of Cryotherapy Ice pack   Electrical Stimulation   Electrical Stimulation Location Bilateral quads during quad sets, SAQ, SLR   Scientist, research (life sciences) Parameters 23-25 ma   Electrical Stimulation Goals Strength   Manual Therapy   Manual Therapy Soft tissue mobilization   Joint Mobilization gentle knee flexion and mobs in knee flexion as well as patella mobs all planes bilateral   Soft tissue mobilization soft tissue   Passive ROM 3 x 30 seconds for flexion                  PT Short Term Goals - 05/09/15 1149    PT SHORT TERM GOAL #1   Title independent with HEP (05/23/15)   Time 4   Period Weeks   Status Achieved   PT SHORT TERM GOAL #2   Title improve bil knee AROM 0-90 for improved function and mobility (05/23/15)   Time 4   Period Weeks   Status Achieved   PT SHORT TERM GOAL #3   Title ambulate > 150' with single point cane for improved function and mobility    Time 4   Period Weeks   Status On-going   PT SHORT TERM GOAL #4   Title report ability to stand > 20 min for improved strength and function    Baseline with RW   Time 4   Period Weeks   Status Achieved           PT Long Term Goals - 04/30/15 1636    PT LONG TERM GOAL #1   Title bil knee AROM 0-105 for improved functional mobility (06/20/15)   Time 8   Period Weeks   Status On-going   PT LONG TERM GOAL #2   Title negotiate at least 3 stairs reciprocally without AD or rail for improved strength and function   Time 8   Period Weeks   Status On-going   PT LONG TERM GOAL #3   Title report ability to walk > 30 min without increase in pain for improved function    Time 8   Period Weeks   Status On-going   PT LONG TERM GOAL #4   Title demonstrate 10 reps SLR bil without extensor lag for improved strength   Time 8   Period Weeks   Status On-going               Plan - 05/12/15 1519  Clinical  Impression Statement Pt is 6 weeks s/p bilatearlly TKA  90 minute treatment today.  Pt increased AROM /PROM of both LE's Right/Left AROM 95/108, PROM 98/112. and added to HEP.  Pt with more pain in Right and limited patellar mobility in R sup/inferior.  Pt making steady progress for ROM goals   Pt will benefit from skilled therapeutic intervention in order to improve on the following deficits Abnormal gait;Decreased strength;Decreased mobility;Increased edema;Pain;Decreased activity tolerance;Decreased endurance;Decreased range of motion;Difficulty walking;Decreased balance   Rehab Potential Good   PT Frequency 3x / week   PT Duration 8 weeks   PT Treatment/Interventions ADLs/Self Care Home Management;Electrical Stimulation;Cryotherapy;Moist Heat;Therapeutic exercise;Therapeutic activities;Functional mobility training;Stair training;Patient/family education;Gait training;Ultrasound;DME Instruction;Balance training;Neuromuscular re-education;Manual techniques;Vasopneumatic Device;Passive range of motion;Scar mobilization   PT Next Visit Plan continue ROM for bilateral knees R>L. Continue [place] for quad strength, Bike for ROM, add hip adductior and hip flexor stretch, patellar mobility   PT Home Exercise Plan HEP with walker leverage/knee flex, TKA phase 2 Exercises in spanish   Consulted and Agree with Plan of Care Family member/caregiver;Patient        Problem List Patient Active Problem List   Diagnosis Date Noted  . Other bilateral secondary osteoarthritis of knee 04/03/2015  . Abnormality of gait 04/26/2014  . 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 Family history of diabetes mellitus (DM) 01/23/2014  . Right knee DJD 01/23/2014  . Hyperlipidemia 01/23/2014  . HYPERLIPIDEMIA 08/14/2007  . DEPRESSION 08/14/2007  . GERD 08/14/2007  . OSTEOARTHRITIS 08/14/2007  . HEADACHE, CHRONIC 08/14/2007   Voncille Lo, PT 05/12/2015 3:39 PM Phone:  [phone] Fax: [facility] [place], [place], [address] Phone: [phone]   Fax:  [phone]  Name: Tina Johnson MRN: [id number] Date of Birth: 1966-04-11

## 2015-05-12 NOTE — Patient Instructions (Addendum)
        Wall exercises as shown in clinic  and Walker exericise for knee flexion 5 x a day as shown in clinic  Voncille Lo, PT 05/12/2015 2:26 PM Phone: [phone] Fax: [phone]

## 2015-05-14 ENCOUNTER — Ambulatory Visit: Payer: Self-pay | Admitting: Physical Therapy

## 2015-05-14 DIAGNOSIS — M25562 Pain in left knee: Secondary | ICD-10-CM

## 2015-05-14 DIAGNOSIS — R269 Unspecified abnormalities of gait and mobility: Secondary | ICD-10-CM

## 2015-05-14 DIAGNOSIS — M25662 Stiffness of left knee, not elsewhere classified: Secondary | ICD-10-CM

## 2015-05-14 DIAGNOSIS — M25669 Stiffness of unspecified knee, not elsewhere classified: Secondary | ICD-10-CM

## 2015-05-14 DIAGNOSIS — M25661 Stiffness of right knee, not elsewhere classified: Secondary | ICD-10-CM

## 2015-05-14 DIAGNOSIS — M25561 Pain in right knee: Secondary | ICD-10-CM

## 2015-05-14 NOTE — Therapy (Signed)
[place], [place], [address] Phone: [phone]   Fax:  [phone]  Physical Therapy Treatment  Patient Details  Name: Tina Johnson MRN: [id number] Date of Birth: June 10, 1965 Referring Provider: Dr Meridee Score / Dr. Erlinda Hong  Encounter Date: 05/14/2015      PT End of Session - 05/14/15 1513    Visit Number 9   Number of Visits 24   Date for PT Re-Evaluation 06/20/15   Authorization Type Self-Pay   PT Start Time 0217   PT Stop Time 0315   PT Time Calculation (min) 58 min      Past Medical History  Diagnosis Date  . Anxiety     controls with exercise   . Indigestion     no medicine at present  . Arthritis     knees , shoulders    Past Surgical History  Procedure Laterality Date  . Abdominal hysterectomy    . Total abdominal hysterectomy w/ bilateral salpingoophorectomy    . Vaginal delivery      ?x2  . Total knee arthroplasty Right 04/03/2015    Procedure: RIGHT TOTAL KNEE ARTHROPLASTY;  Surgeon: Leandrew Koyanagi, MD;  Location: [facility];  Service: Orthopedics;  Laterality: Right;  . Total knee arthroplasty Left 04/03/2015    Procedure: LEFT TOTAL KNEE ARTHROPLASTY;  Surgeon: Leandrew Koyanagi, MD;  Location: [facility];  Service: Orthopedics;  Laterality: Left;    There were no vitals filed for this visit.  Visit Diagnosis:  Decreased range of motion (ROM) of knee  Arthralgia of both knees  Knee stiffness, left  Knee stiffness, right  Abnormality of gait      Subjective Assessment - 05/14/15 1433    Subjective Right knee is swollen and painful.    Currently in Pain? Yes   Pain Score 6    Pain Location Knee   Pain Orientation Right   Aggravating Factors  over doing the stretches   Pain Relieving Factors medication            [facility] PT Assessment - 05/14/15 1430    AROM   Right Knee Flexion 90  new edema today   PROM   Right Knee Flexion 95   Left Knee Flexion 110                      [facility] Adult PT Treatment/Exercise - 05/14/15 0001    Knee/Hip Exercises: Stretches   Active Hamstring Stretch 30 seconds   Active Hamstring Stretch Limitations via prone hang   Other Knee/Hip Stretches prone knee hang per HEP x 1 minute   Other Knee/Hip Stretches RW stretch 15 sec x 3   Knee/Hip Exercises: Aerobic   Nustep L1 x 6 min LE only   Knee/Hip Exercises: Standing   Terminal Knee Extension Limitations  10 x R and then L with Red T band with husband instructed in how to execute at home   Wall Squat 1 set;10 reps;5 seconds  at 60 degrees   Knee/Hip Exercises: Supine   Quad Sets 10 reps   Short Arc Quad Sets 10 reps   Short Arc Quad Sets Limitations requiring assist for full extension   Modalities   Modalities Electrical Stimulation;Vasopneumatic   Cryotherapy   Number Minutes Cryotherapy --   Cryotherapy Location --   Type of Cryotherapy --   Acupuncturist Stimulation Location Right knee   Electrical Stimulation Action IFC x 15 min  Electrical Stimulation Parameters 37m   Electrical Stimulation Goals Pain   Vasopneumatic   Number Minutes Vasopneumatic  15 minutes   Vasopnuematic Location  Knee   Vasopneumatic Pressure Medium   Vasopneumatic Temperature  between 2 and 3 snow flakes                  PT Short Term Goals - 05/09/15 1149    PT SHORT TERM GOAL #1   Title independent with HEP (05/23/15)   Time 4   Period Weeks   Status Achieved   PT SHORT TERM GOAL #2   Title improve bil knee AROM 0-90 for improved function and mobility (05/23/15)   Time 4   Period Weeks   Status Achieved   PT SHORT TERM GOAL #3   Title ambulate > 150' with single point cane for improved function and mobility    Time 4   Period Weeks   Status On-going   PT SHORT TERM GOAL #4   Title report ability to stand > 20 min for improved strength and function    Baseline with RW   Time 4   Period Weeks   Status Achieved            PT Long Term Goals - 05/14/15 1521    PT LONG TERM GOAL #1   Title bil knee AROM 0-105 for improved functional mobility (06/20/15)   Time 8   Period Weeks   Status Partially Met   PT LONG TERM GOAL #2   Title negotiate at least 3 stairs reciprocally without AD or rail for improved strength and function   Time 8   Period Weeks   Status On-going   PT LONG TERM GOAL #3   Title report ability to walk > 30 min without increase in pain for improved function    Time 8   Period Weeks   Status On-going   PT LONG TERM GOAL #4   Title demonstrate 10 reps SLR bil without extensor lag for improved strength   Time 8   Period Weeks   Status On-going               Plan - 05/14/15 1516    Clinical Impression Statement Pt with increased right knee edema and pain due to intense stretching since last viist. Used IFC and vaso to reduce edema and pain. Review of HEP given last visit. Pt reports she has only focused on bending. Reinforced the need for quad/knee  strength as well as pt ROM has progressed well. Today she demonstrates a quad lag of 5-10 degrees R>L.    PT Next Visit Plan FOTO; Assess benefit of IFC and vaso on right knee pain and edema; Nustep for strength(pt has bike at home) quad strength-russian as needed. IFC as needed,, add hip adductor and hip flexor stretch, continue patella mobility        Problem List Patient Active Problem List   Diagnosis Date Noted  . Other bilateral secondary osteoarthritis of knee 04/03/2015  . Abnormality of gait 04/26/2014  . 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 Family history of diabetes mellitus (DM) 01/23/2014  . Right knee DJD 01/23/2014  . Hyperlipidemia 01/23/2014  . HYPERLIPIDEMIA 08/14/2007  . DEPRESSION 08/14/2007  . GERD 08/14/2007  . OSTEOARTHRITIS 08/14/2007  . HEADACHE, CHRONIC 08/14/2007    DDorene Ar PTA 05/14/2015, 3:38 PM  [place]  [place], [place], [address] Phone: [phone]   Fax:  [phone]  Name: Tina Johnson MRN: [id number] Date of Birth: 1965-05-18

## 2015-05-16 ENCOUNTER — Ambulatory Visit: Payer: No Typology Code available for payment source | Admitting: Physical Therapy

## 2015-05-16 DIAGNOSIS — M25661 Stiffness of right knee, not elsewhere classified: Secondary | ICD-10-CM

## 2015-05-16 DIAGNOSIS — M25562 Pain in left knee: Secondary | ICD-10-CM

## 2015-05-16 DIAGNOSIS — M25662 Stiffness of left knee, not elsewhere classified: Secondary | ICD-10-CM

## 2015-05-16 DIAGNOSIS — M25669 Stiffness of unspecified knee, not elsewhere classified: Secondary | ICD-10-CM

## 2015-05-16 DIAGNOSIS — R269 Unspecified abnormalities of gait and mobility: Secondary | ICD-10-CM

## 2015-05-16 DIAGNOSIS — M25561 Pain in right knee: Secondary | ICD-10-CM

## 2015-05-16 NOTE — Therapy (Signed)
[place], [place], [address] Phone: [phone]   Fax:  [phone]  Physical Therapy Treatment  Patient Details  Name: Tina Johnson MRN: [id number] Date of Birth: 01/28/1966 Referring Provider: Dr Meridee Score / Dr. Erlinda Hong  Encounter Date: 05/16/2015      PT End of Session - 05/16/15 1144    Visit Number 10   Number of Visits 24   Date for PT Re-Evaluation 06/20/15   Authorization Type Self-Pay   PT Start Time 1050   PT Stop Time 1153   PT Time Calculation (min) 63 min   Activity Tolerance Patient tolerated treatment well   Behavior During Therapy [facility] for tasks assessed/performed      Past Medical History  Diagnosis Date  . Anxiety     controls with exercise   . Indigestion     no medicine at present  . Arthritis     knees , shoulders    Past Surgical History  Procedure Laterality Date  . Abdominal hysterectomy    . Total abdominal hysterectomy w/ bilateral salpingoophorectomy    . Vaginal delivery      ?x2  . Total knee arthroplasty Right 04/03/2015    Procedure: RIGHT TOTAL KNEE ARTHROPLASTY;  Surgeon: Leandrew Koyanagi, MD;  Location: [facility];  Service: Orthopedics;  Laterality: Right;  . Total knee arthroplasty Left 04/03/2015    Procedure: LEFT TOTAL KNEE ARTHROPLASTY;  Surgeon: Leandrew Koyanagi, MD;  Location: [facility];  Service: Orthopedics;  Laterality: Left;    There were no vitals filed for this visit.  Visit Diagnosis:  Decreased range of motion (ROM) of knee  Arthralgia of both knees  Knee stiffness, left  Knee stiffness, right  Abnormality of gait      Subjective Assessment - 05/16/15 1058    Subjective Pt reported going to the doctor and was told to take vitamins and calcium because her "bones are weak".    Currently in Pain? Yes   Pain Score 3    Pain Location Knee   Pain Orientation Left;Right            [facility] PT Assessment - 05/16/15 0001    Observation/Other Assessments   Skin Integrity 23 (77% limited; original prediction 44% limited)                     [facility] Adult PT Treatment/Exercise - 05/16/15 0001    Exercises   Exercises Knee/Hip   Knee/Hip Exercises: Stretches   Hip Flexor Stretch 2 reps;30 seconds;Both   Other Knee/Hip Stretches --   Knee/Hip Exercises: Aerobic   Nustep L2 x 5 min LE only   Knee/Hip Exercises: Supine   Straight Leg Raises 10 reps;Both   Knee/Hip Exercises: Sidelying   Hip ABduction 10 reps;Both;Strengthening   Hip ABduction Limitations green theraband   Knee/Hip Exercises: Prone   Prone Knee Hang 2 minutes   Prone Knee Hang Weights (lbs) 2#   Straight Leg Raises Strengthening;Both;10 reps   Modalities   Modalities Electrical Stimulation   Cryotherapy   Number Minutes Cryotherapy 15 Minutes   Cryotherapy Location Knee  bilateral   Type of Cryotherapy Ice pack   Electrical Stimulation   Electrical Stimulation Location Right knee   Electrical Stimulation Action IFC   Electrical Stimulation Parameters to tolerance   Electrical Stimulation Goals Pain                PT Education - 05/16/15 1143  Education provided Yes   Education Details hip flexor and adductor stretch   Person(s) Educated Patient   Methods Explanation;Demonstration   Comprehension Verbalized understanding;Returned demonstration          PT Short Term Goals - 05/09/15 1149    PT SHORT TERM GOAL #1   Title independent with HEP (05/23/15)   Time 4   Period Weeks   Status Achieved   PT SHORT TERM GOAL #2   Title improve bil knee AROM 0-90 for improved function and mobility (05/23/15)   Time 4   Period Weeks   Status Achieved   PT SHORT TERM GOAL #3   Title ambulate > 150' with single point cane for improved function and mobility    Time 4   Period Weeks   Status On-going   PT SHORT TERM GOAL #4   Title report ability to stand > 20 min for improved strength and function    Baseline with  RW   Time 4   Period Weeks   Status Achieved           PT Long Term Goals - 05/14/15 1521    PT LONG TERM GOAL #1   Title bil knee AROM 0-105 for improved functional mobility (06/20/15)   Time 8   Period Weeks   Status Partially Met   PT LONG TERM GOAL #2   Title negotiate at least 3 stairs reciprocally without AD or rail for improved strength and function   Time 8   Period Weeks   Status On-going   PT LONG TERM GOAL #3   Title report ability to walk > 30 min without increase in pain for improved function    Time 8   Period Weeks   Status On-going   PT LONG TERM GOAL #4   Title demonstrate 10 reps SLR bil without extensor lag for improved strength   Time 8   Period Weeks   Status On-going               Plan - 05/16/15 1145    Clinical Impression Statement Pt still presented with some edema in the the R knee though not as significant and the pain had decreased from the last session. Focused on bilateral hip strengthening exercises this session in order to improve gait and support for both knees. Ended session with IFC and ice packs in order to help decrease the swelling and keep the pain down. Pts FOTO score decreased, possibly due to the resent set back of increased pain and edema or language barrier (pt required interpreter to read FOTO).   PT Next Visit Plan continue knee/hip strengthening; aggressive ROM, progress HEP PRN   Consulted and Agree with Plan of Care Patient        Problem List Patient Active Problem List   Diagnosis Date Noted  . Other bilateral secondary osteoarthritis of knee 04/03/2015  . Abnormality of gait 04/26/2014  . 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 Family history of diabetes mellitus (DM) 01/23/2014  . Right knee DJD 01/23/2014  . Hyperlipidemia 01/23/2014  . HYPERLIPIDEMIA 08/14/2007  . DEPRESSION 08/14/2007  . GERD 08/14/2007  . OSTEOARTHRITIS 08/14/2007  . HEADACHE, CHRONIC  08/14/2007      Ammie Ferrier, SPT 05/16/2015  12:03 PM  [place], [place], [address] Phone: [phone]   Fax:  [phone]  Name: Tina Johnson MRN: [id number] Date of  Birth: Nov 19, 1965

## 2015-05-19 ENCOUNTER — Ambulatory Visit: Payer: No Typology Code available for payment source | Admitting: Physical Therapy

## 2015-05-19 DIAGNOSIS — M25661 Stiffness of right knee, not elsewhere classified: Secondary | ICD-10-CM

## 2015-05-19 DIAGNOSIS — M25562 Pain in left knee: Secondary | ICD-10-CM

## 2015-05-19 DIAGNOSIS — R269 Unspecified abnormalities of gait and mobility: Secondary | ICD-10-CM

## 2015-05-19 DIAGNOSIS — M25662 Stiffness of left knee, not elsewhere classified: Secondary | ICD-10-CM

## 2015-05-19 DIAGNOSIS — R600 Localized edema: Secondary | ICD-10-CM

## 2015-05-19 DIAGNOSIS — M25561 Pain in right knee: Secondary | ICD-10-CM

## 2015-05-19 DIAGNOSIS — M25669 Stiffness of unspecified knee, not elsewhere classified: Secondary | ICD-10-CM

## 2015-05-20 NOTE — Therapy (Signed)
[place], [place], [address] Phone: [phone]   Fax:  [phone]  Physical Therapy Treatment  Patient Details  Name: Tina Johnson MRN: [id number] Date of Birth: 09-20-1965 Referring Provider: Dr Meridee Score / Dr. Erlinda Hong  Encounter Date: 05/19/2015      PT End of Session - 05/19/15 1520    Visit Number 11  Progress note sent to [facility]   Number of Visits 24   Date for PT Re-Evaluation 06/20/15   Authorization Type Self-Pay   PT Start Time 0215   PT Stop Time 0325   PT Time Calculation (min) 70 min   Activity Tolerance Patient limited by pain   Behavior During Therapy [facility] for tasks assessed/performed      Past Medical History  Diagnosis Date  . Anxiety     controls with exercise   . Indigestion     no medicine at present  . Arthritis     knees , shoulders    Past Surgical History  Procedure Laterality Date  . Abdominal hysterectomy    . Total abdominal hysterectomy w/ bilateral salpingoophorectomy    . Vaginal delivery      ?x2  . Total knee arthroplasty Right 04/03/2015    Procedure: RIGHT TOTAL KNEE ARTHROPLASTY;  Surgeon: Leandrew Koyanagi, MD;  Location: [place];  Service: Orthopedics;  Laterality: Right;  . Total knee arthroplasty Left 04/03/2015    Procedure: LEFT TOTAL KNEE ARTHROPLASTY;  Surgeon: Leandrew Koyanagi, MD;  Location: [place];  Service: Orthopedics;  Laterality: Left;    There were no vitals filed for this visit.  Visit Diagnosis:  Decreased range of motion (ROM) of knee  Arthralgia of both knees  Knee stiffness, left  Knee stiffness, right  Abnormality of gait  Localized edema      Subjective Assessment - 05/19/15 1419    Subjective Pt reports sore and swollen   Patient is accompained by: Family member;Interpreter   How long can you sit comfortably? 1-2 hours   How long can you stand comfortably? 15 min   How long can you walk comfortably? 30 min   Patient  Stated Goals return to baseline; walk normally   Currently in Pain? Yes   Pain Score 3   R 3/10  L 2/10   Pain Location Knee   Pain Orientation Right;Left            [facility] PT Assessment - 05/19/15 1430    Circumferential Edema   Circumferential - Right 34.0 cm mid patellar   Circumferential - Left  34.5 cm  midpatellar   AROM   Right Knee Extension 0   Right Knee Flexion 92  in sitting  10 degree quad lag   Left Knee Extension 0   Left Knee Flexion 110   PROM   Right Knee Flexion 92  limted by pain and edema   Left Knee Flexion 111   Strength   Right Hip Flexion 4-/5   Right Hip Extension 3/5   Right Hip ABduction 4-/5   Left Hip Flexion 4-/5   Left Hip Extension 3/5   Left Hip ABduction 4/5   Right Knee Flexion 3-/5   Right Knee Extension 3-/5   Left Knee Flexion 3-/5   Left Knee Extension 3-/5                     [facility] Adult PT Treatment/Exercise - 05/19/15 1436    Ambulation/Gait  Ambulation/Gait Yes   Ambulation/Gait Assistance 6: Modified independent (Device/Increase time)   Assistive device Rolling walker   Gait Pattern Decreased step length - left;Decreased step length - right;Decreased hip/knee flexion - right;Decreased hip/knee flexion - left   Ambulation Surface Level   Gait velocity 1.86f/sec   Exercises   Exercises Knee/Hip   Knee/Hip Exercises: Stretches   Active Hamstring Stretch 30 seconds   Active Hamstring Stretch Limitations with strap in supine   Hip Flexor Stretch 2 reps;30 seconds;Both   Knee/Hip Exercises: Aerobic   Nustep --   Knee/Hip Exercises: Standing   Knee Flexion 20 reps   Knee Flexion Limitations bilateral   Functional Squat 10 reps   Functional Squat Limitations mini squat  at high low table   Other Standing Knee Exercises Wall UE flexion and stepping with glut activation with R UE and then L UE for Right LE and then Left LE x 10 each   Knee/Hip Exercises: Seated   Long Arc Quad 10 reps  bil   Other Seated  Knee/Hip Exercises bil PT assist  with resisitance knee flex and ext in sitting 10 x  right and left    Knee/Hip Exercises: Supine   Quad Sets 10 reps  bil x 2 with towels at ankle   Short Arc Quad Sets 10 reps  bil   Short Arc Quad Sets Limitations requiring assistance with full extension on right   Heel Slides 10 reps   Heel Slides Limitations Right more difficult due to pain  in Right   Straight Leg Raises 10 reps;Both   Knee/Hip Exercises: Sidelying   Hip ABduction 10 reps;Both;Strengthening   Hip ABduction Limitations green theraband   Knee/Hip Exercises: Prone   Prone Knee Hang --   Prone Knee Hang Weights (lbs) --   Straight Leg Raises --   Modalities   Modalities Electrical Stimulation   Cryotherapy   Number Minutes Cryotherapy 15 Minutes   Cryotherapy Location Knee  bilateral   Type of Cryotherapy Other (comment)  VASO   Electrical Stimulation   Electrical Stimulation Location Right knee   Electrical Stimulation Action IFC   Electrical Stimulation Parameters to tolerance  bilateral knees   Electrical Stimulation Goals Pain   Vasopneumatic   Number Minutes Vasopneumatic  15 minutes   Vasopnuematic Location  Knee  right   Vasopneumatic Pressure Medium   Vasopneumatic Temperature  medium pressure 32 degrees   Manual Therapy   Joint Mobilization gentle knee flexion and mobs in knee flexion as well as patella mobs all planes bilateral   Soft tissue mobilization soft tissue to bil quads   Passive ROM knee flex and ext bil in seated postion on hight low table                PT Education - 05/19/15 1520    Education provided Yes   Education Details Pt direced to use rolling walker and emphasized RICE and need for extension/quad strengthening program as well as flexion   Person(s) Educated Patient;Spouse;Other (comment)  interpreter   Methods Explanation;Demonstration   Comprehension Verbalized understanding;Returned demonstration          PT Short Term  Goals - 05/19/15 1520    PT SHORT TERM GOAL #1   Title independent with HEP (05/23/15)   Time 4   Period Weeks   Status Achieved   PT SHORT TERM GOAL #2   Title improve bil knee AROM 0-90 for improved function and mobility (05/23/15)   Baseline Right 92,  left 110   Time 4   Period Weeks   Status Achieved   PT SHORT TERM GOAL #3   Title ambulate > 150' with single point cane for improved function and mobility    Baseline on rolling walker.  right knee with edema > than Left   Time 4   Period Weeks   Status On-going   PT SHORT TERM GOAL #4   Title report ability to stand > 20 min for improved strength and function    Baseline with RW 30 minutes   Time 4   Period Weeks   Status Achieved           PT Long Term Goals - 05/19/15 1522    PT LONG TERM GOAL #1   Title bil knee AROM 0-105 for improved functional mobility (06/20/15)   Baseline Right 92, left 111 flexion   Time 8   Period Weeks   Status Partially Met   PT LONG TERM GOAL #2   Title negotiate at least 3 stairs reciprocally without AD or rail for improved strength and function   Time 8   Period Weeks   Status Unable to assess   PT LONG TERM GOAL #3   Title report ability to walk > 30 min without increase in pain for improved function    Time 8   Period Weeks   Status On-going   PT LONG TERM GOAL #4   Title demonstrate 10 reps SLR bil without extensor lag for improved strength   Time 8   Period Weeks   Status On-going               Plan - 05/19/15 1524    Clinical Impression Statement Pt still presented with edema slightly greater in right knee over left  34.5/34.0 cm.  Pt with decreased strength in quadriceps R greater than L.  Pt FOTO score decreased last visit due to the recent swelling/edema, ( 77% limitation from 60 % limtation. Knee ext flex/ext R/L 3-/5 .  Pt with core weakness grossly 3+/5 to 4-/5. Pt still utilizing rolling walking due to increased sweling in Right knee.  Pt  now increasing knee  flexion from set back. Pt through interpreter was educated on need for RICE and knee quad/extension strength for  better  out come.  Pt demonstrates 10 degree right  quad lag in sitting..  Pt told to utilize rolling walker  for more normalized gait .  Pt had only been focusing on bending the knee.  Pt clearly understands importance of knee extension and strengthening in order to progress with maximum ROM and function throught interpreter.    Pt will benefit from skilled therapeutic intervention in order to improve on the following deficits Abnormal gait;Decreased strength;Decreased mobility;Increased edema;Pain;Decreased activity tolerance;Decreased endurance;Decreased range of motion;Difficulty walking;Decreased balance   Rehab Potential Good   PT Frequency 3x / week   PT Treatment/Interventions ADLs/Self Care Home Management;Electrical Stimulation;Cryotherapy;Moist Heat;Therapeutic exercise;Therapeutic activities;Functional mobility training;Stair training;Patient/family education;Gait training;Ultrasound;DME Instruction;Balance training;Neuromuscular re-education;Manual techniques;Vasopneumatic Device;Passive range of motion;Scar mobilization   PT Next Visit Plan continue knee/hip strengthening; aggressive ROM, progress HEP PRN   PT Home Exercise Plan HEP with TKA phase 2 exericise with emphasis on pain/edema control and knee strengthening.    Consulted and Agree with Plan of Care Patient;Family member/caregiver;Other (Comment)  interpreter   Family Member Consulted husband        Problem List Patient Active Problem List   Diagnosis Date Noted  . Other bilateral secondary  osteoarthritis of knee 04/03/2015  . Abnormality of gait 04/26/2014  . 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 Family history of diabetes mellitus (DM) 01/23/2014  . Right knee DJD 01/23/2014  . Hyperlipidemia 01/23/2014  . HYPERLIPIDEMIA 08/14/2007  . DEPRESSION 08/14/2007  .  GERD 08/14/2007  . OSTEOARTHRITIS 08/14/2007  . HEADACHE, CHRONIC 08/14/2007    Voncille Lo, PT 05/20/2015 6:50 AM Phone: [phone] Fax: [facility] [address] [place], [place], [address] Phone: [phone]   Fax:  [phone]  Name: Tina Johnson MRN: [id number] Date of Birth: 1966/04/01

## 2015-05-21 ENCOUNTER — Ambulatory Visit: Payer: Self-pay

## 2015-05-21 ENCOUNTER — Ambulatory Visit: Payer: Self-pay | Attending: Orthopedic Surgery | Admitting: Physical Therapy

## 2015-05-21 DIAGNOSIS — M25669 Stiffness of unspecified knee, not elsewhere classified: Secondary | ICD-10-CM

## 2015-05-21 DIAGNOSIS — M25562 Pain in left knee: Secondary | ICD-10-CM | POA: Insufficient documentation

## 2015-05-21 DIAGNOSIS — M25662 Stiffness of left knee, not elsewhere classified: Secondary | ICD-10-CM | POA: Insufficient documentation

## 2015-05-21 DIAGNOSIS — M25561 Pain in right knee: Secondary | ICD-10-CM | POA: Insufficient documentation

## 2015-05-21 DIAGNOSIS — R269 Unspecified abnormalities of gait and mobility: Secondary | ICD-10-CM | POA: Insufficient documentation

## 2015-05-21 DIAGNOSIS — R600 Localized edema: Secondary | ICD-10-CM | POA: Insufficient documentation

## 2015-05-21 DIAGNOSIS — M25661 Stiffness of right knee, not elsewhere classified: Secondary | ICD-10-CM | POA: Insufficient documentation

## 2015-05-21 DIAGNOSIS — M24669 Ankylosis, unspecified knee: Secondary | ICD-10-CM | POA: Insufficient documentation

## 2015-05-21 NOTE — Therapy (Signed)
[place], [place], [address] Phone: [phone]   Fax:  [phone]  Physical Therapy Treatment  Patient Details  Name: Tina Johnson MRN: [id number] Date of Birth: Apr 07, 1966 Referring Provider: Dr Meridee Score / Dr. Erlinda Hong  Encounter Date: 05/21/2015      PT End of Session - 05/21/15 1618    Visit Number 12   Number of Visits 24   Date for PT Re-Evaluation 06/20/15   Authorization Type Self-Pay   PT Start Time 1214   PT Stop Time 1351   PT Time Calculation (min) 97 min   Activity Tolerance Patient tolerated treatment well   Behavior During Therapy [facility] for tasks assessed/performed      Past Medical History  Diagnosis Date  . Anxiety     controls with exercise   . Indigestion     no medicine at present  . Arthritis     knees , shoulders    Past Surgical History  Procedure Laterality Date  . Abdominal hysterectomy    . Total abdominal hysterectomy w/ bilateral salpingoophorectomy    . Vaginal delivery      ?x2  . Total knee arthroplasty Right 04/03/2015    Procedure: RIGHT TOTAL KNEE ARTHROPLASTY;  Surgeon: Leandrew Koyanagi, MD;  Location: [place];  Service: Orthopedics;  Laterality: Right;  . Total knee arthroplasty Left 04/03/2015    Procedure: LEFT TOTAL KNEE ARTHROPLASTY;  Surgeon: Leandrew Koyanagi, MD;  Location: [place];  Service: Orthopedics;  Laterality: Left;    There were no vitals filed for this visit.  Visit Diagnosis:  Decreased range of motion (ROM) of knee  Arthralgia of both knees  Knee stiffness, left  Knee stiffness, right  Abnormality of gait  Localized edema      Subjective Assessment - 05/21/15 1419    Subjective Pt reports a little pain and swelling. Pt stated concerns about the previous therapist that led her session. Pt reported that she felt worse and was not able to do her exercises from where the therapist "bent her leg very hard, twisted and pulled it".    Patient is accompained by: Family member;Interpreter   Currently in Pain? Yes   Pain Score 3    Pain Location Knee   Pain Orientation Right;Left   Pain Descriptors / Indicators Aching                         [facility] Adult PT Treatment/Exercise - 05/21/15 1434    Knee/Hip Exercises: Aerobic   Recumbent Bike     Nustep L6 x 8 min LE only   Knee/Hip Exercises: Standing   Hip Flexion Stengthening;Both;1 set;10 reps;Knee bent   Hip Flexion Limitations 2#   Hip Abduction Stengthening;Both;1 set;10 reps;Knee straight   Abduction Limitations 2#   Hip Extension Stengthening;1 set;10 reps;Knee straight   Extension Limitations 2#   Forward Step Up Both;1 set;10 reps;Hand Hold: 2;Step Height: 4"   Forward Step Up Limitations compensated through hip hike, toe off and touch down; pt confused by directions also   Knee/Hip Exercises: Supine   Short Arc Quad Sets Both;1 set;10 reps   Short Arc Quad Sets Limitations 2# unable to extend to 0 degrees; 10 without weight still unable to fully extend bil   Bridges Limitations x10 2 sec hold   Bridges with Ball Squeeze 1 set;Strengthening;Both;10 reps   Knee/Hip Exercises: Sidelying   Hip ABduction Both;10 reps;Strengthening   Hip ABduction  Limitations clamshells, green theraband, cues to raise leg higher and to not roll back   Cryotherapy   Number Minutes Cryotherapy 15 Minutes   Cryotherapy Location Knee  right   Type of Cryotherapy Ice pack   Vasopneumatic   Number Minutes Vasopneumatic  15 minutes   Vasopnuematic Location  Knee  left   Vasopneumatic Pressure Medium   Vasopneumatic Temperature  max cold   Ambulation   Ambulation/Gait Assistance Details Verbal cues for gait pattern  heel strike to toe off, flex knees and hips, > step length                PT Education - 05/21/15 1615    Education provided Yes   Education Details proper gait, can exercise on her bike at home with discretion of listening to body and  pain levels   Person(s) Educated Patient;Spouse   Methods Explanation;Demonstration   Comprehension Verbalized understanding;Returned demonstration;Need further instruction          PT Short Term Goals - 05/19/15 1520    PT SHORT TERM GOAL #1   Title independent with HEP (05/23/15)   Time 4   Period Weeks   Status Achieved   PT SHORT TERM GOAL #2   Title improve bil knee AROM 0-90 for improved function and mobility (05/23/15)   Baseline Right 92, left 110   Time 4   Period Weeks   Status Achieved   PT SHORT TERM GOAL #3   Title ambulate > 150' with single point cane for improved function and mobility    Baseline on rolling walker.  right knee with edema > than Left   Time 4   Period Weeks   Status On-going   PT SHORT TERM GOAL #4   Title report ability to stand > 20 min for improved strength and function    Baseline with RW 30 minutes   Time 4   Period Weeks   Status Achieved           PT Long Term Goals - 05/19/15 1522    PT LONG TERM GOAL #1   Title bil knee AROM 0-105 for improved functional mobility (06/20/15)   Baseline Right 92, left 111 flexion   Time 8   Period Weeks   Status Partially Met   PT LONG TERM GOAL #2   Title negotiate at least 3 stairs reciprocally without AD or rail for improved strength and function   Time 8   Period Weeks   Status Unable to assess   PT LONG TERM GOAL #3   Title report ability to walk > 30 min without increase in pain for improved function    Time 8   Period Weeks   Status On-going   PT LONG TERM GOAL #4   Title demonstrate 10 reps SLR bil without extensor lag for improved strength   Time 8   Period Weeks   Status On-going               Plan - 05/21/15 1620    Clinical Impression Statement 90 min treatment session. Pt presented with edema greater in the left knee. Patellar crepitus bil with lateral translations mobility > than inf/sup translations. Pt is still lacking ability to extend bil knees to 0 degrees  against  gravity. PT attempted step up exercises though there was language barrier which caused confusion for how to appropriately complete the exercises. Pt needs more concentric and eccentric quad exercises to improve step up form  and prevent compensations. Pt was educated through interpreter to correct gait form and begin practicing that form at home. Pt continues to be highly motivated to improve and complete all necessary exercises correctly.   PT Next Visit Plan continue knee/hip strengthening (con/ecc quad exercises), ROM, progress HEP PRN, low level CKC   Consulted and Agree with Plan of Care Patient;Family member/caregiver        Problem List Patient Active Problem List   Diagnosis Date Noted  . Other bilateral secondary osteoarthritis of knee 04/03/2015  . Abnormality of gait 04/26/2014  . 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 Family history of diabetes mellitus (DM) 01/23/2014  . Right knee DJD 01/23/2014  . Hyperlipidemia 01/23/2014  . HYPERLIPIDEMIA 08/14/2007  . DEPRESSION 08/14/2007  . GERD 08/14/2007  . OSTEOARTHRITIS 08/14/2007  . HEADACHE, CHRONIC 08/14/2007     Ammie Ferrier, SPT 05/21/2015  5:32 PM  [facility] [address] [place], [place], [address] Phone: [phone]   Fax:  [phone]  Name: Tina Johnson MRN: [id number] Date of Birth: Nov 23, 1965

## 2015-05-23 ENCOUNTER — Ambulatory Visit: Payer: Self-pay | Admitting: Physical Therapy

## 2015-05-23 DIAGNOSIS — M25662 Stiffness of left knee, not elsewhere classified: Secondary | ICD-10-CM

## 2015-05-23 DIAGNOSIS — R600 Localized edema: Secondary | ICD-10-CM

## 2015-05-23 DIAGNOSIS — R269 Unspecified abnormalities of gait and mobility: Secondary | ICD-10-CM

## 2015-05-23 DIAGNOSIS — M25669 Stiffness of unspecified knee, not elsewhere classified: Secondary | ICD-10-CM

## 2015-05-23 DIAGNOSIS — M25561 Pain in right knee: Secondary | ICD-10-CM

## 2015-05-23 DIAGNOSIS — M25661 Stiffness of right knee, not elsewhere classified: Secondary | ICD-10-CM

## 2015-05-23 DIAGNOSIS — M25562 Pain in left knee: Secondary | ICD-10-CM

## 2015-05-23 NOTE — Therapy (Signed)
[place], [place], [address] Phone: [phone]   Fax:  [phone]  Physical Therapy Treatment  Patient Details  Name: Tina Johnson MRN: [id number] Date of Birth: 03-04-1966 Referring Provider: Dr Meridee Score / Dr. Erlinda Hong  Encounter Date: 05/23/2015      PT End of Session - 05/23/15 1155    Visit Number 13   Number of Visits 24   Date for PT Re-Evaluation 06/20/15   PT Start Time 1100   PT Stop Time 1145   PT Time Calculation (min) 45 min   Activity Tolerance Patient tolerated treatment well   Behavior During Therapy [facility] for tasks assessed/performed      Past Medical History  Diagnosis Date  . Anxiety     controls with exercise   . Indigestion     no medicine at present  . Arthritis     knees , shoulders    Past Surgical History  Procedure Laterality Date  . Abdominal hysterectomy    . Total abdominal hysterectomy w/ bilateral salpingoophorectomy    . Vaginal delivery      ?x2  . Total knee arthroplasty Right 04/03/2015    Procedure: RIGHT TOTAL KNEE ARTHROPLASTY;  Surgeon: Leandrew Koyanagi, MD;  Location: [place];  Service: Orthopedics;  Laterality: Right;  . Total knee arthroplasty Left 04/03/2015    Procedure: LEFT TOTAL KNEE ARTHROPLASTY;  Surgeon: Leandrew Koyanagi, MD;  Location: [place];  Service: Orthopedics;  Laterality: Left;    There were no vitals filed for this visit.  Visit Diagnosis:  Decreased range of motion (ROM) of knee  Arthralgia of both knees  Knee stiffness, left  Knee stiffness, right  Abnormality of gait  Localized edema      Subjective Assessment - 05/23/15 1108    Subjective Pt reports that her knees are working good at home. "I want to do everything possible to be better".   Patient is accompained by: Interpreter   Currently in Pain? Yes   Pain Score 3    Pain Orientation Right;Left  Left more painful   Pain Descriptors / Indicators Aching   Pain  Type Surgical pain                         [facility] Adult PT Treatment/Exercise - 05/23/15 0001    Knee/Hip Exercises: Stretches   Passive Hamstring Stretch 2 reps;Both;30 seconds   Passive Hamstring Stretch Limitations belt    Knee/Hip Exercises: Aerobic   Nustep L6 x 9 min   Knee/Hip Exercises: Machines for Strengthening   Cybex Leg Press 1 plate x 10 both LE   Knee/Hip Exercises: Supine   Knee Extension Strengthening;Both;2 sets;10 reps   Knee Extension Limitations green TB knee 90 to 0  unable to do single leg cybex leg press at this time   Manual Therapy   Joint Mobilization knee flexion mobs with distraction grade 2-3 bil, knee extension with overpressure bil                 PT Education - 05/23/15 1155    Education provided No          PT Short Term Goals - 05/19/15 1520    PT SHORT TERM GOAL #1   Title independent with HEP (05/23/15)   Time 4   Period Weeks   Status Achieved   PT SHORT TERM GOAL #2   Title improve bil knee AROM 0-90 for  improved function and mobility (05/23/15)   Baseline Right 92, left 110   Time 4   Period Weeks   Status Achieved   PT SHORT TERM GOAL #3   Title ambulate > 150' with single point cane for improved function and mobility    Baseline on rolling walker.  right knee with edema > than Left   Time 4   Period Weeks   Status On-going   PT SHORT TERM GOAL #4   Title report ability to stand > 20 min for improved strength and function    Baseline with RW 30 minutes   Time 4   Period Weeks   Status Achieved           PT Long Term Goals - 05/19/15 1522    PT LONG TERM GOAL #1   Title bil knee AROM 0-105 for improved functional mobility (06/20/15)   Baseline Right 92, left 111 flexion   Time 8   Period Weeks   Status Partially Met   PT LONG TERM GOAL #2   Title negotiate at least 3 stairs reciprocally without AD or rail for improved strength and function   Time 8   Period Weeks   Status Unable to assess    PT LONG TERM GOAL #3   Title report ability to walk > 30 min without increase in pain for improved function    Time 8   Period Weeks   Status On-going   PT LONG TERM GOAL #4   Title demonstrate 10 reps SLR bil without extensor lag for improved strength   Time 8   Period Weeks   Status On-going               Plan - 05/23/15 1155    Clinical Impression Statement Pt tolerated all exercises well today. Focused session on quad concentric and eccentric strengthening to help with extensor lag. Pt knee extension ROM is WNL however needs continued knee flexion ROM. Pt instructed to ice when she gets home as no cryotherapy was provided today.  Pt needs to continue using the RW for safe ambulation, though she would benefit from more gait training in order to eventually move to gait training with the [facility].   PT Next Visit Plan continue knee/hip strengthening (con/ecc quad exercises), knee flexion ROM, progress HEP PRN, low level CKC   Consulted and Agree with Plan of Care Patient        Problem List Patient Active Problem List   Diagnosis Date Noted  . Other bilateral secondary osteoarthritis of knee 04/03/2015  . Abnormality of gait 04/26/2014  . 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 Family history of diabetes mellitus (DM) 01/23/2014  . Right knee DJD 01/23/2014  . Hyperlipidemia 01/23/2014  . HYPERLIPIDEMIA 08/14/2007  . DEPRESSION 08/14/2007  . GERD 08/14/2007  . OSTEOARTHRITIS 08/14/2007  . HEADACHE, CHRONIC 08/14/2007      Ammie Ferrier, SPT 05/23/2015  12:08 PM  [place], [place], [address] Phone: [phone]   Fax:  [phone]  Name: Tina Johnson MRN: [id number] Date of Birth: 1965-12-07

## 2015-05-26 ENCOUNTER — Ambulatory Visit: Payer: Self-pay | Admitting: Physical Therapy

## 2015-05-26 DIAGNOSIS — R269 Unspecified abnormalities of gait and mobility: Secondary | ICD-10-CM

## 2015-05-26 DIAGNOSIS — M25561 Pain in right knee: Secondary | ICD-10-CM

## 2015-05-26 DIAGNOSIS — M25562 Pain in left knee: Principal | ICD-10-CM

## 2015-05-26 DIAGNOSIS — M25661 Stiffness of right knee, not elsewhere classified: Secondary | ICD-10-CM

## 2015-05-26 DIAGNOSIS — M25669 Stiffness of unspecified knee, not elsewhere classified: Secondary | ICD-10-CM

## 2015-05-26 DIAGNOSIS — M25662 Stiffness of left knee, not elsewhere classified: Secondary | ICD-10-CM

## 2015-05-26 DIAGNOSIS — R600 Localized edema: Secondary | ICD-10-CM

## 2015-05-26 NOTE — Therapy (Signed)
[place], [place], [address] Phone: [phone]   Fax:  [phone]  Physical Therapy Treatment  Patient Details  Name: Tina Johnson MRN: [id number] Date of Birth: 17-Dec-1965 Referring Provider: Dr Meridee Score / Dr. Erlinda Hong  Encounter Date: 05/26/2015      PT End of Session - 05/26/15 1119    Visit Number 14   Number of Visits 24   Date for PT Re-Evaluation 06/20/15   Authorization Type Self-Pay   PT Start Time 1019   PT Stop Time 1114   PT Time Calculation (min) 55 min   Activity Tolerance Patient tolerated treatment well   Behavior During Therapy [facility] for tasks assessed/performed      Past Medical History  Diagnosis Date  . Anxiety     controls with exercise   . Indigestion     no medicine at present  . Arthritis     knees , shoulders    Past Surgical History  Procedure Laterality Date  . Abdominal hysterectomy    . Total abdominal hysterectomy w/ bilateral salpingoophorectomy    . Vaginal delivery      ?x2  . Total knee arthroplasty Right 04/03/2015    Procedure: RIGHT TOTAL KNEE ARTHROPLASTY;  Surgeon: Leandrew Koyanagi, MD;  Location: [place];  Service: Orthopedics;  Laterality: Right;  . Total knee arthroplasty Left 04/03/2015    Procedure: LEFT TOTAL KNEE ARTHROPLASTY;  Surgeon: Leandrew Koyanagi, MD;  Location: [place];  Service: Orthopedics;  Laterality: Left;    There were no vitals filed for this visit.  Visit Diagnosis:  Arthralgia of both knees  Decreased range of motion (ROM) of knee  Knee stiffness, left  Knee stiffness, right  Abnormality of gait  Localized edema      Subjective Assessment - 05/26/15 1023    Subjective I walked for about 30 min continuously at church yesterday.  No pain right now.     Currently in Pain? No/denies            [facility] Adult PT Treatment/Exercise - 05/26/15 1043    Knee/Hip Exercises: Aerobic   Nustep L6 x 9 min  LE only with grip  pad under hips to prevent pelvic motion   Knee/Hip Exercises: Standing   Lateral Step Up Both;1 set;20 reps;Hand Hold: 2   Functional Squat 2 sets;10 reps;Other (comment)   Functional Squat Limitations 1 set with UE and 1 set without    SLS with Vectors abd and ext with 2 UE assist   Knee/Hip Exercises: Seated   Long Arc Quad Strengthening;Both;10 reps;Weights   Long Arc Quad Weight 4 lbs.   Long [employer] Limitations cues to contract 5 sec   Knee/Hip Exercises: Supine   Knee Extension AAROM;Both   Knee Flexion AAROM;Both   Cryotherapy   Number Minutes Cryotherapy 10 Minutes   Cryotherapy Location Knee  bilat   Type of Cryotherapy Ice pack                PT Education - 05/26/15 1118    Education provided Yes   Education Details HEP and activation of quads in closed chain    Person(s) Educated Patient   Methods Explanation;Demonstration   Comprehension Verbalized understanding;Returned demonstration          PT Short Term Goals - 05/19/15 1520    PT SHORT TERM GOAL #1   Title independent with HEP (05/23/15)   Time 4   Period Weeks  Status Achieved   PT SHORT TERM GOAL #2   Title improve bil knee AROM 0-90 for improved function and mobility (05/23/15)   Baseline Right 92, left 110   Time 4   Period Weeks   Status Achieved   PT SHORT TERM GOAL #3   Title ambulate > 150' with single point cane for improved function and mobility    Baseline on rolling walker.  right knee with edema > than Left   Time 4   Period Weeks   Status On-going   PT SHORT TERM GOAL #4   Title report ability to stand > 20 min for improved strength and function    Baseline with RW 30 minutes   Time 4   Period Weeks   Status Achieved           PT Long Term Goals - 05/19/15 1522    PT LONG TERM GOAL #1   Title bil knee AROM 0-105 for improved functional mobility (06/20/15)   Baseline Right 92, left 111 flexion   Time 8   Period Weeks   Status Partially Met   PT LONG TERM GOAL #2    Title negotiate at least 3 stairs reciprocally without AD or rail for improved strength and function   Time 8   Period Weeks   Status Unable to assess   PT LONG TERM GOAL #3   Title report ability to walk > 30 min without increase in pain for improved function    Time 8   Period Weeks   Status On-going   PT LONG TERM GOAL #4   Title demonstrate 10 reps SLR bil without extensor lag for improved strength   Time 8   Period Weeks   Status On-going               Plan - 05/26/15 1121    Clinical Impression Statement Strengthening emphasized today without compensations in pelvis.  Encouraged her to continue working hard daily to improve her knee ROM and strength.     PT Next Visit Plan continue knee/hip strengthening (con/ecc quad exercises), knee flexion ROM, progress HEP PRN, low level CKC   PT Home Exercise Plan HEP with TKA phase 2 exericise with emphasis on pain/edema control and knee strengthening. and gave lateral step ups.    Consulted and Agree with Plan of Care Patient        Problem List Patient Active Problem List   Diagnosis Date Noted  . Other bilateral secondary osteoarthritis of knee 04/03/2015  . Abnormality of gait 04/26/2014  . 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 Family history of diabetes mellitus (DM) 01/23/2014  . Right knee DJD 01/23/2014  . Hyperlipidemia 01/23/2014  . HYPERLIPIDEMIA 08/14/2007  . DEPRESSION 08/14/2007  . GERD 08/14/2007  . OSTEOARTHRITIS 08/14/2007  . HEADACHE, CHRONIC 08/14/2007    Tina Johnson 05/26/2015, 11:35 AM  [facility] [address] [place], [place], [address] Phone: [phone]   Fax:  [phone]  Name: Tina Johnson MRN: [id number] Date of Birth: [birth year]  Raeford Razor, PT 05/26/2015 11:36 AM Phone: [phone] Fax: [phone]

## 2015-05-26 NOTE — Patient Instructions (Signed)
Lateral Step-Up    De pie con un escaln de __20-24_ cm, ubicado en direccin L. [place]scienda con el pie derecho, mirando hacia adelante, sin impulsarse con el otro pie. Termine el ejercicio tocando el escaln con el pie que estaba en el piso para mantener el equilibrio y regrese __20_ veces. _1_ Tina Johnson _1_ veces al da.  mantenga sus cadereas niveladas cuando esta haciendo esto.  http://gglj.exer.us/167   Copyright  VHI. All rights reserved.

## 2015-05-27 ENCOUNTER — Ambulatory Visit: Payer: Self-pay | Admitting: Physical Therapy

## 2015-05-27 DIAGNOSIS — M25661 Stiffness of right knee, not elsewhere classified: Secondary | ICD-10-CM

## 2015-05-27 DIAGNOSIS — M25669 Stiffness of unspecified knee, not elsewhere classified: Secondary | ICD-10-CM

## 2015-05-27 DIAGNOSIS — R600 Localized edema: Secondary | ICD-10-CM

## 2015-05-27 DIAGNOSIS — M25561 Pain in right knee: Secondary | ICD-10-CM

## 2015-05-27 DIAGNOSIS — M25662 Stiffness of left knee, not elsewhere classified: Secondary | ICD-10-CM

## 2015-05-27 DIAGNOSIS — R269 Unspecified abnormalities of gait and mobility: Secondary | ICD-10-CM

## 2015-05-27 DIAGNOSIS — M25562 Pain in left knee: Principal | ICD-10-CM

## 2015-05-27 NOTE — Therapy (Signed)
[place], [place], [address] Phone: [phone]   Fax:  [phone]  Physical Therapy Treatment  Patient Details  Name: Tina Johnson MRN: [id number] Date of Birth: Oct 30, 1965 Referring Provider: Dr Meridee Score / Dr. Erlinda Hong  Encounter Date: 05/27/2015      PT End of Session - 05/27/15 1200    Visit Number 15   Number of Visits 24   PT Start Time 3893   PT Stop Time 1245   PT Time Calculation (min) 56 min   Equipment Utilized During Treatment Gait belt   Activity Tolerance Patient tolerated treatment well   Behavior During Therapy [facility] for tasks assessed/performed      Past Medical History  Diagnosis Date  . Anxiety     controls with exercise   . Indigestion     no medicine at present  . Arthritis     knees , shoulders    Past Surgical History  Procedure Laterality Date  . Abdominal hysterectomy    . Total abdominal hysterectomy w/ bilateral salpingoophorectomy    . Vaginal delivery      ?x2  . Total knee arthroplasty Right 04/03/2015    Procedure: RIGHT TOTAL KNEE ARTHROPLASTY;  Surgeon: Leandrew Koyanagi, MD;  Location: [place];  Service: Orthopedics;  Laterality: Right;  . Total knee arthroplasty Left 04/03/2015    Procedure: LEFT TOTAL KNEE ARTHROPLASTY;  Surgeon: Leandrew Koyanagi, MD;  Location: [place];  Service: Orthopedics;  Laterality: Left;    There were no vitals filed for this visit.  Visit Diagnosis:  Arthralgia of both knees  Decreased range of motion (ROM) of knee  Knee stiffness, left  Knee stiffness, right  Abnormality of gait  Localized edema      Subjective Assessment - 05/27/15 1159    Subjective DId a little bit of exercise last night.  No pain today, I am fine.    Currently in Pain? No/denies            [facility] PT Assessment - 05/27/15 1326    PROM   PROM Assessment Site --  bilateral tightnes in bilateral ER          [facility] Adult PT Treatment/Exercise -  05/27/15 1326    Ambulation/Gait   Ambulation/Gait Yes   Ambulation/Gait Assistance 5: Supervision   Assistive device Straight cane   Gait Pattern Decreased hip/knee flexion - right;Decreased hip/knee flexion - left;Abducted - left;Abducted- right  Rt. foot turns in (hip IR)   Ambulation Surface Level;Indoor   Gait Comments cues for knee and hip flex, heel strike   Knee/Hip Exercises: Aerobic   Nustep L6, 10 min  non skid under hips   Knee/Hip Exercises: Standing   Heel Raises Both;1 set;5 reps        Pilates Reformer used for LE/core strength, postural strength, lumbopelvic disassociation and core control.  Exercises included: Footwork 2 Red 1 blue heels, ball between knees for alignment  2 Red 1 yellow for PF (forefoot), heels raises and stretch to post knee.  Increased time to interpret and coordinate breathing with movement.   Single leg work 2 Red x 10 each       PT Education - 05/27/15 1330    Education provided Yes   Education Details gait, cane (SPC vs. quad cane) and Pilates Reformer   Person(s) Educated Patient   Methods Explanation;Demonstration;Tactile cues;Verbal cues   Comprehension Verbalized understanding;Returned demonstration;Need further instruction  PT Short Term Goals - 05/27/15 1343    PT SHORT TERM GOAL #1   Title independent with HEP (05/23/15)   Status Achieved   PT SHORT TERM GOAL #2   Title improve bil knee AROM 0-90 for improved function and mobility (05/23/15)   Status Achieved   PT SHORT TERM GOAL #3   Title ambulate > 150' with single point cane for improved function and mobility    Baseline done with supervision and mod verbal cues   Status Partially Met   PT SHORT TERM GOAL #4   Title report ability to stand > 20 min for improved strength and function    Status Achieved           PT Long Term Goals - 05/27/15 1344    PT LONG TERM GOAL #1   Title bil knee AROM 0-105 for improved functional mobility (06/20/15)   Status  Partially Met   PT LONG TERM GOAL #2   Title negotiate at least 3 stairs reciprocally without AD or rail for improved strength and function   Status On-going   PT LONG TERM GOAL #3   Title report ability to walk > 30 min without increase in pain for improved function    Status On-going   PT LONG TERM GOAL #4   Title demonstrate 10 reps SLR bil without extensor lag for improved strength   Status On-going               Plan - 05/27/15 1339    Clinical Impression Statement Worked on strengthening in good corrected aligmment with Pilates Reformer.  HIps tight in ER.  Compensates by hiking or dropping pelvis.  Apparent quad weakness, modified springs to allow successful movement. Pt with slow processing vs. language barrier with certain tasks.    PT Next Visit Plan continue knee/hip strengthening (con/ecc quad exercises), knee flexion ROM, gait with cane, can re-try Reformer.    PT Home Exercise Plan HEP with TKA phase 2 exericise with emphasis on pain/edema control and knee strengthening. and gave lateral step ups.    Consulted and Agree with Plan of Care Patient        Problem List Patient Active Problem List   Diagnosis Date Noted  . Other bilateral secondary osteoarthritis of knee 04/03/2015  . Abnormality of gait 04/26/2014  . 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 Family history of diabetes mellitus (DM) 01/23/2014  . Right knee DJD 01/23/2014  . Hyperlipidemia 01/23/2014  . HYPERLIPIDEMIA 08/14/2007  . DEPRESSION 08/14/2007  . GERD 08/14/2007  . OSTEOARTHRITIS 08/14/2007  . HEADACHE, CHRONIC 08/14/2007    Sahasra Belue 05/27/2015, 1:44 PM  [facility] [address] [place], [place], [address] Phone: [phone]   Fax:  [phone]  Name: Iliza Blankenbeckler MRN: [id number] Date of Birth: 06-27-1965   Raeford Razor, PT 05/27/2015 1:49 PM Phone: [phone] Fax:  [phone]

## 2015-05-28 ENCOUNTER — Encounter: Payer: No Typology Code available for payment source | Admitting: Physical Therapy

## 2015-05-29 ENCOUNTER — Ambulatory Visit: Payer: Self-pay | Admitting: Physical Therapy

## 2015-05-29 DIAGNOSIS — M25562 Pain in left knee: Principal | ICD-10-CM

## 2015-05-29 DIAGNOSIS — R600 Localized edema: Secondary | ICD-10-CM

## 2015-05-29 DIAGNOSIS — M25662 Stiffness of left knee, not elsewhere classified: Secondary | ICD-10-CM

## 2015-05-29 DIAGNOSIS — M25561 Pain in right knee: Secondary | ICD-10-CM

## 2015-05-29 DIAGNOSIS — R269 Unspecified abnormalities of gait and mobility: Secondary | ICD-10-CM

## 2015-05-29 DIAGNOSIS — M25661 Stiffness of right knee, not elsewhere classified: Secondary | ICD-10-CM

## 2015-05-29 DIAGNOSIS — M25669 Stiffness of unspecified knee, not elsewhere classified: Secondary | ICD-10-CM

## 2015-05-29 NOTE — Therapy (Signed)
[place], [place], [address] Phone: [phone]   Fax:  [phone]  Physical Therapy Treatment  Patient Details  Name: Tina Johnson MRN: [id number] Date of Birth: Oct 08, 1965 Referring Provider: Dr Meridee Score / Dr. Erlinda Hong  Encounter Date: 05/29/2015      PT End of Session - 05/29/15 0941    Visit Number 16   Number of Visits 24   Date for PT Re-Evaluation 06/20/15   Authorization Type Self-Pay   PT Start Time 0758   PT Stop Time 0946   PT Time Calculation (min) 108 min      Past Medical History  Diagnosis Date  . Anxiety     controls with exercise   . Indigestion     no medicine at present  . Arthritis     knees , shoulders    Past Surgical History  Procedure Laterality Date  . Abdominal hysterectomy    . Total abdominal hysterectomy w/ bilateral salpingoophorectomy    . Vaginal delivery      ?x2  . Total knee arthroplasty Right 04/03/2015    Procedure: RIGHT TOTAL KNEE ARTHROPLASTY;  Surgeon: Leandrew Koyanagi, MD;  Location: [place];  Service: Orthopedics;  Laterality: Right;  . Total knee arthroplasty Left 04/03/2015    Procedure: LEFT TOTAL KNEE ARTHROPLASTY;  Surgeon: Leandrew Koyanagi, MD;  Location: [place];  Service: Orthopedics;  Laterality: Left;    There were no vitals filed for this visit.  Visit Diagnosis:  Arthralgia of both knees  Decreased range of motion (ROM) of knee  Knee stiffness, left  Knee stiffness, right  Abnormality of gait  Localized edema      Subjective Assessment - 05/29/15 0805    Currently in Pain? No/denies                         [facility] Adult PT Treatment/Exercise - 05/29/15 0808    High Level Balance   High Level Balance Comments Narrow balance with rebounder toss green ball, progressing to SLS 30 seconds bilateral, narrow balance on foam pad rebounder toss, alternating step taps to 6 inch step then unilateral step taps, added foam  pad for challenge, SLS at rebounder bilateral 2 toss best all with CGA   Knee/Hip Exercises: Aerobic   Nustep L5 x 7 minutes, LE only   Knee/Hip Exercises: Machines for Strengthening   Cybex Leg Press 1 plate x 10 2 plates x 10   Knee/Hip Exercises: Standing   Knee Flexion 1 set;20 reps   Knee Flexion Limitations 3#   Forward Step Up 2 sets;10 reps;Step Height: 6"   Forward Step Up Limitations CGA to min assist  more difficult right   Knee/Hip Exercises: Seated   Long Arc Quad Weight 2 lbs.   Long [employer] Limitations unable to attain full extension without assist, decreased to AROM with assist    Other Seated Knee/Hip Exercises Seated fetter knee extensions 1 black x 20 each   Knee/Hip Exercises: Supine   Terminal Knee Extension 2 sets;10 reps   Terminal Knee Extension Limitations bilateral with heel prop and small towel under knee.    Straight Leg Raises 1 set;10 reps   Straight Leg Raises Limitations focusing on TKE prior to lift and to maintain extension- pt reports increased pain right medial knee- added mcconenls tape from tibial tubersoity to medial knee with significant decrease in pain and able to perform exercises with continued quad  lag   Manual Therapy   Manual Therapy Taping   McConnell Fat pad release medial knee to decrease pain- educated to remove by tomorrow so that she can resume flexion stretching- pt to work on quad set/ TKE with SLR thiis evening while tape is still on her knee                  PT Short Term Goals - 05/27/15 1343    PT SHORT TERM GOAL #1   Title independent with HEP (05/23/15)   Status Achieved   PT SHORT TERM GOAL #2   Title improve bil knee AROM 0-90 for improved function and mobility (05/23/15)   Status Achieved   PT SHORT TERM GOAL #3   Title ambulate > 150' with single point cane for improved function and mobility    Baseline done with supervision and mod verbal cues   Status Partially Met   PT SHORT TERM GOAL #4   Title report  ability to stand > 20 min for improved strength and function    Status Achieved           PT Long Term Goals - 05/27/15 1344    PT LONG TERM GOAL #1   Title bil knee AROM 0-105 for improved functional mobility (06/20/15)   Status Partially Met   PT LONG TERM GOAL #2   Title negotiate at least 3 stairs reciprocally without AD or rail for improved strength and function   Status On-going   PT LONG TERM GOAL #3   Title report ability to walk > 30 min without increase in pain for improved function    Status On-going   PT LONG TERM GOAL #4   Title demonstrate 10 reps SLR bil without extensor lag for improved strength   Status On-going               Plan - 05/29/15 0955    Clinical Impression Statement Focused balance training today with pt progressively improving througout treatment. Able to SLS 30 seconds bilateral and perform rebounder toss in SLS. CGA throughout session. Pt continues to demonstrate quad lag bilateral and unable to perform unassisted LAQ for full ROM. Worked on TKE with heel prop and prolonged contractions. Tape helpful to reduce medial knee pain for these exercises.    PT Next Visit Plan continue knee/hip strengthening (con/ecc quad exercises), knee flexion ROM, gait with cane, can re-try Reformer. QUAD STRENGTH!!!- check flexion ROM        Problem List Patient Active Problem List   Diagnosis Date Noted  . Other bilateral secondary osteoarthritis of knee 04/03/2015  . Abnormality of gait 04/26/2014  . 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 Family history of diabetes mellitus (DM) 01/23/2014  . Right knee DJD 01/23/2014  . Hyperlipidemia 01/23/2014  . HYPERLIPIDEMIA 08/14/2007  . DEPRESSION 08/14/2007  . GERD 08/14/2007  . OSTEOARTHRITIS 08/14/2007  . HEADACHE, CHRONIC 08/14/2007    Dorene Ar, PTA 05/29/2015, 9:59 AM  [place], [place], [address] Phone: [phone]   Fax:  [phone]  Name: Tina Johnson MRN: [id number] Date of Birth: 1965/06/23

## 2015-06-02 ENCOUNTER — Ambulatory Visit: Payer: Self-pay | Admitting: Physical Therapy

## 2015-06-02 DIAGNOSIS — M25661 Stiffness of right knee, not elsewhere classified: Secondary | ICD-10-CM

## 2015-06-02 DIAGNOSIS — R269 Unspecified abnormalities of gait and mobility: Secondary | ICD-10-CM

## 2015-06-02 DIAGNOSIS — M25561 Pain in right knee: Secondary | ICD-10-CM

## 2015-06-02 DIAGNOSIS — M25562 Pain in left knee: Principal | ICD-10-CM

## 2015-06-02 DIAGNOSIS — M25669 Stiffness of unspecified knee, not elsewhere classified: Secondary | ICD-10-CM

## 2015-06-02 DIAGNOSIS — M25662 Stiffness of left knee, not elsewhere classified: Secondary | ICD-10-CM

## 2015-06-02 DIAGNOSIS — R600 Localized edema: Secondary | ICD-10-CM

## 2015-06-02 NOTE — Therapy (Signed)
[place], [place], [address] Phone: [phone]   Fax:  [phone]  Physical Therapy Treatment  Patient Details  Name: Tina Johnson MRN: [id number] Date of Birth: 11-12-1965 Referring Provider: Dr Meridee Score / Dr. Erlinda Hong  Encounter Date: 06/02/2015      PT End of Session - 06/02/15 1249    Visit Number 17   Number of Visits 24   Date for PT Re-Evaluation 06/20/15   PT Start Time 1016   PT Stop Time 1112   PT Time Calculation (min) 56 min   Activity Tolerance Patient tolerated treatment well   Behavior During Therapy [facility] for tasks assessed/performed      Past Medical History  Diagnosis Date  . Anxiety     controls with exercise   . Indigestion     no medicine at present  . Arthritis     knees , shoulders    Past Surgical History  Procedure Laterality Date  . Abdominal hysterectomy    . Total abdominal hysterectomy w/ bilateral salpingoophorectomy    . Vaginal delivery      ?x2  . Total knee arthroplasty Right 04/03/2015    Procedure: RIGHT TOTAL KNEE ARTHROPLASTY;  Surgeon: Leandrew Koyanagi, MD;  Location: [place];  Service: Orthopedics;  Laterality: Right;  . Total knee arthroplasty Left 04/03/2015    Procedure: LEFT TOTAL KNEE ARTHROPLASTY;  Surgeon: Leandrew Koyanagi, MD;  Location: [place];  Service: Orthopedics;  Laterality: Left;    There were no vitals filed for this visit.  Visit Diagnosis:  Arthralgia of both knees  Decreased range of motion (ROM) of knee  Knee stiffness, left  Knee stiffness, right  Abnormality of gait  Localized edema      Subjective Assessment - 06/02/15 1012    Subjective "I have been practicing bending my knee and it is still a little sore"   Currently in Pain? No/denies   Pain Score --  took medication at 9:30 Am   Pain Orientation Right;Left            [facility] PT Assessment - 06/02/15 1027    AROM   Right Knee Flexion 90  92   Left Knee  Flexion 98  105                     [facility] Adult PT Treatment/Exercise - 06/02/15 1022    Knee/Hip Exercises: Aerobic   Nustep L6 x 6 min LE only  moving seat closer at 3 min   Knee/Hip Exercises: Machines for Strengthening   Cybex Leg Press 20# x 20 (new leg press machine)  she really liked it and would like to do more next visit   Knee/Hip Exercises: Standing   Knee Flexion 20 reps;1 set   Knee Flexion Limitations 3#   Forward Step Up 2 sets;10 reps;Step Height: 6"   Forward Step Up Limitations CGA to min assist   Gait Training 2 x 20 ft cues for bending the knees and flexing the hip, and focusing on exagerrated heel strike and toe off.    Knee/Hip Exercises: Seated   Long Arc Quad Strengthening;Both;10 reps;Weights   Long Arc Quad Weight 3 lbs.   Long [employer] Limitations unable to attain full extension without assist, decreased to AROM with assist   tactile cues for extension    Cryotherapy   Number Minutes Cryotherapy 10 Minutes   Cryotherapy Location Knee  bil   Type of  Cryotherapy Ice pack   Manual Therapy   Joint Mobilization knee flexion mobs with distraction grade 2-3 bil, knee extension with overpressure bil    Passive ROM knee flex with oscillations                 PT Education - 06/02/15 1249    Education provided Yes   Education Details gait training   Person(s) Educated Patient   Methods Explanation   Comprehension Verbalized understanding          PT Short Term Goals - 05/27/15 1343    PT SHORT TERM GOAL #1   Title independent with HEP (05/23/15)   Status Achieved   PT SHORT TERM GOAL #2   Title improve bil knee AROM 0-90 for improved function and mobility (05/23/15)   Status Achieved   PT SHORT TERM GOAL #3   Title ambulate > 150' with single point cane for improved function and mobility    Baseline done with supervision and mod verbal cues   Status Partially Met   PT SHORT TERM GOAL #4   Title report ability to stand > 20 min  for improved strength and function    Status Achieved           PT Long Term Goals - 05/27/15 1344    PT LONG TERM GOAL #1   Title bil knee AROM 0-105 for improved functional mobility (06/20/15)   Status Partially Met   PT LONG TERM GOAL #2   Title negotiate at least 3 stairs reciprocally without AD or rail for improved strength and function   Status On-going   PT LONG TERM GOAL #3   Title report ability to walk > 30 min without increase in pain for improved function    Status On-going   PT LONG TERM GOAL #4   Title demonstrate 10 reps SLR bil without extensor lag for improved strength   Status On-going               Plan - 06/02/15 1249    Clinical Impression Statement Salley reports that she is feeling a little sore but has been working more on her flexion. Focused todays session on increasing flexion, strengthening and gait training. She requires tactile cues during LAQ exercises to facilitate going to same point every time, but still demonstates limitaiton with extension. Attempted new leg press machine which she reported she really liked and would like to do more next visit. Followed todays session with Ice to decrease soreness.    PT Next Visit Plan continue knee/hip strengthening (con/ecc quad exercises), knee flexion ROM, gait with cane, can re-try Reformer. QUAD STRENGTH!!!- check flexion ROM   PT Home Exercise Plan gait training with exaggerated heel strike and toe off   Consulted and Agree with Plan of Care Patient        Problem List Patient Active Problem List   Diagnosis Date Noted  . Other bilateral secondary osteoarthritis of knee 04/03/2015  . Abnormality of gait 04/26/2014  . 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 Family history of diabetes mellitus (DM) 01/23/2014  . Right knee DJD 01/23/2014  . Hyperlipidemia 01/23/2014  . HYPERLIPIDEMIA 08/14/2007  . DEPRESSION 08/14/2007  . GERD 08/14/2007  .  OSTEOARTHRITIS 08/14/2007  . HEADACHE, CHRONIC 08/14/2007   [name] PT, DPT, LAT, ATC  06/02/2015  12:54 PM     [place], [place],  [address] Phone: [phone]   Fax:  [phone]  Name: Anavictoria Wilk MRN: [id number] Date of Birth: 28-May-1965

## 2015-06-04 ENCOUNTER — Ambulatory Visit: Payer: Self-pay | Admitting: Physical Therapy

## 2015-06-04 DIAGNOSIS — R600 Localized edema: Secondary | ICD-10-CM

## 2015-06-04 DIAGNOSIS — M25561 Pain in right knee: Secondary | ICD-10-CM

## 2015-06-04 DIAGNOSIS — M25661 Stiffness of right knee, not elsewhere classified: Secondary | ICD-10-CM

## 2015-06-04 DIAGNOSIS — M25662 Stiffness of left knee, not elsewhere classified: Secondary | ICD-10-CM

## 2015-06-04 DIAGNOSIS — M25562 Pain in left knee: Principal | ICD-10-CM

## 2015-06-04 DIAGNOSIS — M25669 Stiffness of unspecified knee, not elsewhere classified: Secondary | ICD-10-CM

## 2015-06-04 DIAGNOSIS — R269 Unspecified abnormalities of gait and mobility: Secondary | ICD-10-CM

## 2015-06-04 NOTE — Therapy (Signed)
[place], [place], [address] Phone: [phone]   Fax:  [phone]  Physical Therapy Treatment  Patient Details  Name: Tina Johnson MRN: [id number] Date of Birth: 1966-02-08 Referring Provider: Dr Meridee Score / Dr. Erlinda Hong  Encounter Date: 06/04/2015      PT End of Session - 06/04/15 0924    Visit Number 18   Number of Visits 24   Date for PT Re-Evaluation 06/20/15   Authorization Type Self-Pay   PT Start Time 0850   PT Stop Time 0945   PT Time Calculation (min) 55 min      Past Medical History  Diagnosis Date  . Anxiety     controls with exercise   . Indigestion     no medicine at present  . Arthritis     knees , shoulders    Past Surgical History  Procedure Laterality Date  . Abdominal hysterectomy    . Total abdominal hysterectomy w/ bilateral salpingoophorectomy    . Vaginal delivery      ?x2  . Total knee arthroplasty Right 04/03/2015    Procedure: RIGHT TOTAL KNEE ARTHROPLASTY;  Surgeon: Leandrew Koyanagi, MD;  Location: [place];  Service: Orthopedics;  Laterality: Right;  . Total knee arthroplasty Left 04/03/2015    Procedure: LEFT TOTAL KNEE ARTHROPLASTY;  Surgeon: Leandrew Koyanagi, MD;  Location: [place];  Service: Orthopedics;  Laterality: Left;    There were no vitals filed for this visit.  Visit Diagnosis:  Arthralgia of both knees  Decreased range of motion (ROM) of knee  Knee stiffness, left  Knee stiffness, right  Abnormality of gait  Localized edema      Subjective Assessment - 06/04/15 0923    Subjective The left one is a little swollen.    Currently in Pain? Yes   Pain Score 3    Pain Location Knee   Pain Orientation Left   Aggravating Factors  riding the bicycle at home is hard   Pain Relieving Factors ice                         [facility] Adult PT Treatment/Exercise - 06/04/15 0001    Ambulation/Gait   Stairs Yes   Stair Management Technique  Alternating pattern   Number of Stairs 10   Height of Stairs 6   Gait Comments Pt able to negotiate 6 inch stairs using bilateral hand rails , gait in parallel bars focusing on heel strike and knee flexion, also backward with toe strike and side stepping, gait in clinic with no AD and SBA, cues for knee flexion, neutral hips, and heel strike. Pt to practivce more with SPC at home and be more conscious of knee flexion.    Knee/Hip Exercises: Machines for Strengthening   Cybex Knee Extension 5# bilateral - x 10- needs assist   Cybex Knee Flexion 15# bilateral difficulty reaching foot lift with ankles   Cybex Leg Press 25# 2 x 20   Knee/Hip Exercises: Standing   Lateral Step Up 2 sets;10 reps;Step Height: 4";Hand Hold: 1   Forward Step Up 2 sets;10 reps;Step Height: 6";Hand Hold: 2   Forward Step Up Limitations bilateral    Cryotherapy   Number Minutes Cryotherapy 15 Minutes   Cryotherapy Location Knee   Type of Cryotherapy Ice pack  bilateral                  PT Short Term Goals -  05/27/15 1343    PT SHORT TERM GOAL #1   Title independent with HEP (05/23/15)   Status Achieved   PT SHORT TERM GOAL #2   Title improve bil knee AROM 0-90 for improved function and mobility (05/23/15)   Status Achieved   PT SHORT TERM GOAL #3   Title ambulate > 150' with single point cane for improved function and mobility    Baseline done with supervision and mod verbal cues   Status Partially Met   PT SHORT TERM GOAL #4   Title report ability to stand > 20 min for improved strength and function    Status Achieved           PT Long Term Goals - 05/27/15 1344    PT LONG TERM GOAL #1   Title bil knee AROM 0-105 for improved functional mobility (06/20/15)   Status Partially Met   PT LONG TERM GOAL #2   Title negotiate at least 3 stairs reciprocally without AD or rail for improved strength and function   Status On-going   PT LONG TERM GOAL #3   Title report ability to walk > 30 min without  increase in pain for improved function    Status On-going   PT LONG TERM GOAL #4   Title demonstrate 10 reps SLR bil without extensor lag for improved strength   Status On-going               Plan - 06/04/15 1155    Clinical Impression Statement Pt able to negotiate 6 inch stairs using bilateral hand rails , gait in parallel bars focusing on heel strike and knee flexion, also backward with toe strike and side stepping, gait in clinic with no AD and SBA, cues for knee flexion, neutral hips, and heel strike. Pt to practice more with SPC at home and be more conscious of knee flexion. No additional goals met. Progressing toward STG#3 and LTG#2.   PT Next Visit Plan continue knee/hip strengthening (con/ecc quad exercises), knee flexion ROM, gait with cane, can re-try Reformer. QUAD STRENGTH!!!- check flexion ROM        Problem List Patient Active Problem List   Diagnosis Date Noted  . Other bilateral secondary osteoarthritis of knee 04/03/2015  . Abnormality of gait 04/26/2014  . 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 Family history of diabetes mellitus (DM) 01/23/2014  . Right knee DJD 01/23/2014  . Hyperlipidemia 01/23/2014  . HYPERLIPIDEMIA 08/14/2007  . DEPRESSION 08/14/2007  . GERD 08/14/2007  . OSTEOARTHRITIS 08/14/2007  . HEADACHE, CHRONIC 08/14/2007    Dorene Ar, PTA 06/04/2015, 11:58 AM  [place], [place], [address] Phone: [phone]   Fax:  [phone]  Name: Tina Johnson MRN: [id number] Date of Birth: Oct 10, 1965

## 2015-06-06 ENCOUNTER — Ambulatory Visit: Payer: Self-pay | Admitting: Physical Therapy

## 2015-06-06 DIAGNOSIS — M25561 Pain in right knee: Secondary | ICD-10-CM

## 2015-06-06 DIAGNOSIS — M25661 Stiffness of right knee, not elsewhere classified: Secondary | ICD-10-CM

## 2015-06-06 DIAGNOSIS — R269 Unspecified abnormalities of gait and mobility: Secondary | ICD-10-CM

## 2015-06-06 DIAGNOSIS — M25562 Pain in left knee: Principal | ICD-10-CM

## 2015-06-06 DIAGNOSIS — M25669 Stiffness of unspecified knee, not elsewhere classified: Secondary | ICD-10-CM

## 2015-06-06 DIAGNOSIS — M25662 Stiffness of left knee, not elsewhere classified: Secondary | ICD-10-CM

## 2015-06-06 NOTE — Therapy (Signed)
[place], [place], [address] Phone: [phone]   Fax:  [phone]  Physical Therapy Treatment  Patient Details  Name: Tina Johnson MRN: [id number] Date of Birth: 1965/05/15 Referring Provider: Dr Meridee Score / Dr. Erlinda Hong  Encounter Date: 06/06/2015      PT End of Session - 06/06/15 1125    Visit Number 19   Number of Visits 24   Date for PT Re-Evaluation 06/20/15   Authorization Type Self-Pay   PT Start Time 1017   PT Stop Time 1139   PT Time Calculation (min) 82 min   Activity Tolerance Patient tolerated treatment well   Behavior During Therapy [facility] for tasks assessed/performed      Past Medical History  Diagnosis Date  . Anxiety     controls with exercise   . Indigestion     no medicine at present  . Arthritis     knees , shoulders    Past Surgical History  Procedure Laterality Date  . Abdominal hysterectomy    . Total abdominal hysterectomy w/ bilateral salpingoophorectomy    . Vaginal delivery      ?x2  . Total knee arthroplasty Right 04/03/2015    Procedure: RIGHT TOTAL KNEE ARTHROPLASTY;  Surgeon: Leandrew Koyanagi, MD;  Location: [place];  Service: Orthopedics;  Laterality: Right;  . Total knee arthroplasty Left 04/03/2015    Procedure: LEFT TOTAL KNEE ARTHROPLASTY;  Surgeon: Leandrew Koyanagi, MD;  Location: [place];  Service: Orthopedics;  Laterality: Left;    There were no vitals filed for this visit.  Visit Diagnosis:  Arthralgia of both knees  Decreased range of motion (ROM) of knee  Knee stiffness, left  Knee stiffness, right  Abnormality of gait      Subjective Assessment - 06/06/15 1030    Subjective " I feel difficulty when getting up from a seated position" Bending is still difficult but I feel better after I work it out.    Pain Score --  difficult to say because i took my pain medication   Pain Location Knee   Pain Orientation Right;Left   Pain Onset More than a  month ago   Pain Frequency Intermittent   Aggravating Factors  bending the knee, getting up after sitting for a longer period of time.             [facility] PT Assessment - 06/06/15 1040    AROM   Right Knee Flexion 87  93 following manual   Left Knee Flexion 93  101 following mobs                     [facility] Adult PT Treatment/Exercise - 06/06/15 1126    Self-Care   Self-Care Other Self-Care Comments;ADL's   ADL's with heel slides to pull more, if she isn't feeling a good stretch she isn't pulling enough, but if she is feeling pain then she is pulling too much.    Other Self-Care Comments  edema reduction massage techniques to assist with improving swelling   Knee/Hip Exercises: Machines for Strengthening   Cybex Knee Extension 5# 1 x 19  required assisatances with lifting   Cybex Knee Flexion 10# 1 x 10 bil LE, 1 x 10 15 #,   2 pillows behind back to assist with reaching pad for feet   Cybex Leg Press 35# 1 x 10  cues for proper breathing during exercise   Knee/Hip Exercises: Standing   Lateral  Step Up 2 sets;10 reps;Step Height: 4";Hand Hold: 1   Gait Training 2 x 20 ft with focus on heel strike and toe off and bending the knee with SPC   Other Standing Knee Exercises 2 x 10 marching   cues to lift knees exaggerated   Knee/Hip Exercises: Supine   Heel Slides 10 reps  bil with strap    Heel Slides Limitations educated she needs to pull more on strap to facilitate more of stretch to improve flexion mobility   Cryotherapy   Number Minutes Cryotherapy 10 Minutes   Cryotherapy Location Knee   Type of Cryotherapy Ice pack   Manual Therapy   Joint Mobilization knee flexion mobs with distraction grade 2-3 bil, knee extension with overpressure bil    Soft tissue mobilization antegrade/retrograde massage performed bil  educated how to perform at home   Passive ROM knee flex with oscillations peformed bil                  PT Short Term Goals - 06/06/15 1145     PT SHORT TERM GOAL #1   Title independent with HEP (05/23/15)   Time 4   Period Weeks   Status Achieved   PT SHORT TERM GOAL #2   Title improve bil knee AROM 0-90 for improved function and mobility (05/23/15)   Time 4   Period Weeks   Status Achieved   PT SHORT TERM GOAL #3   Title ambulate > 150' with single point cane for improved function and mobility    Time 4   Period Weeks   Status Partially Met   PT SHORT TERM GOAL #4   Title report ability to stand > 20 min for improved strength and function    Time 4   Period Weeks   Status Achieved           PT Long Term Goals - 06/06/15 1145    PT LONG TERM GOAL #1   Title bil knee AROM 0-105 for improved functional mobility (06/20/15)   Time 8   Period Weeks   Status Partially Met   PT LONG TERM GOAL #2   Title negotiate at least 3 stairs reciprocally without AD or rail for improved strength and function   Time 8   Period Weeks   Status On-going   PT LONG TERM GOAL #3   Title report ability to walk > 30 min without increase in pain for improved function    Time 8   Period Weeks   Status On-going   PT LONG TERM GOAL #4   Title demonstrate 10 reps SLR bil without extensor lag for improved strength   Time 8   Period Weeks   Status On-going               Plan - 06/06/15 1133    Clinical Impression Statement pt arrived to therapy without her RW using a SPC today. Focused todays session on improving flexion which following manual improved to 101 on the L and 93 with R knee flexion. Educated on techniques to work getting rid of swelling with massage and elevation with ice. She was able to perofrm all exercises requiring some assitance with knee extension due to weakness bil. following todays session utilized bil ice pack to decrese pain in bil knees.    PT Next Visit Plan continue knee/hip strengthening (con/ecc quad exercises), knee flexion ROM, gait with cane, can re-try Reformer. QUAD STRENGTH!!! continues to monitor  flexion  PT Home Exercise Plan Reviewed Heel slides (should feel a stretch)   Consulted and Agree with Plan of Care Patient        Problem List Patient Active Problem List   Diagnosis Date Noted  . Other bilateral secondary osteoarthritis of knee 04/03/2015  . Abnormality of gait 04/26/2014  . 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 Family history of diabetes mellitus (DM) 01/23/2014  . Right knee DJD 01/23/2014  . Hyperlipidemia 01/23/2014  . HYPERLIPIDEMIA 08/14/2007  . DEPRESSION 08/14/2007  . GERD 08/14/2007  . OSTEOARTHRITIS 08/14/2007  . HEADACHE, CHRONIC 08/14/2007   [name] PT, DPT, LAT, ATC  06/06/2015  11:46 AM     [facility] [address] [place], [place], [address] Phone: [phone]   Fax:  [phone]  Name: Fareeha Evon MRN: [id number] Date of Birth: 1966-02-08

## 2015-06-09 ENCOUNTER — Ambulatory Visit: Payer: Self-pay | Admitting: Physical Therapy

## 2015-06-09 DIAGNOSIS — M25662 Stiffness of left knee, not elsewhere classified: Secondary | ICD-10-CM

## 2015-06-09 DIAGNOSIS — M25661 Stiffness of right knee, not elsewhere classified: Secondary | ICD-10-CM

## 2015-06-09 DIAGNOSIS — M25562 Pain in left knee: Principal | ICD-10-CM

## 2015-06-09 DIAGNOSIS — R600 Localized edema: Secondary | ICD-10-CM

## 2015-06-09 DIAGNOSIS — M25669 Stiffness of unspecified knee, not elsewhere classified: Secondary | ICD-10-CM

## 2015-06-09 DIAGNOSIS — M25561 Pain in right knee: Secondary | ICD-10-CM

## 2015-06-09 DIAGNOSIS — R269 Unspecified abnormalities of gait and mobility: Secondary | ICD-10-CM

## 2015-06-09 NOTE — Therapy (Signed)
[place], [place], [address] Phone: [phone]   Fax:  [phone]  Physical Therapy Treatment  Patient Details  Name: Tina Johnson MRN: [id number] Date of Birth: January 12, 1966 Referring Provider: Dr Meridee Score / Dr. Erlinda Hong  Encounter Date: 06/09/2015      PT End of Session - 06/09/15 1232    Visit Number 20   Number of Visits 24   Date for PT Re-Evaluation 06/20/15   Authorization Type Self-Pay   PT Start Time 1017   PT Stop Time 1113   PT Time Calculation (min) 56 min   Activity Tolerance Patient tolerated treatment well   Behavior During Therapy [facility] for tasks assessed/performed      Past Medical History  Diagnosis Date  . Anxiety     controls with exercise   . Indigestion     no medicine at present  . Arthritis     knees , shoulders    Past Surgical History  Procedure Laterality Date  . Abdominal hysterectomy    . Total abdominal hysterectomy w/ bilateral salpingoophorectomy    . Vaginal delivery      ?x2  . Total knee arthroplasty Right 04/03/2015    Procedure: RIGHT TOTAL KNEE ARTHROPLASTY;  Surgeon: Leandrew Koyanagi, MD;  Location: [place];  Service: Orthopedics;  Laterality: Right;  . Total knee arthroplasty Left 04/03/2015    Procedure: LEFT TOTAL KNEE ARTHROPLASTY;  Surgeon: Leandrew Koyanagi, MD;  Location: [place];  Service: Orthopedics;  Laterality: Left;    There were no vitals filed for this visit.  Visit Diagnosis:  Arthralgia of both knees  Decreased range of motion (ROM) of knee  Knee stiffness, left  Knee stiffness, right  Abnormality of gait  Localized edema      Subjective Assessment - 06/09/15 1023    Subjective "I am feeling a little more swollen today"    Currently in Pain? No/denies   Pain Score --  took pain medication this  morning so no report of pain   Pain Location Knee   Pain Orientation Right;Left            [facility] PT Assessment - 06/09/15  1030    AROM   Right Knee Flexion 92   Left Knee Flexion 99                     [facility] Adult PT Treatment/Exercise - 06/09/15 1024    Knee/Hip Exercises: Aerobic   Nustep L 5 x 5 min   Knee/Hip Exercises: Machines for Strengthening   Cybex Leg Press 35# 1 x 10   Knee/Hip Exercises: Standing   Lateral Step Up 2 sets;10 reps;Step Height: 4";Hand Hold: 1   Functional Squat 1 set;10 reps  cues to bend knees   Functional Squat Limitations using table as feedback to go to right level each time.    Gait Training 2 x 20 ft with focus on heel strike and toe off and bending the knee with SPC   Other Standing Knee Exercises 2 x 10 marching    Knee/Hip Exercises: Seated   Long Arc Quad AROM;Strengthening;Both;2 sets;10 reps;Weights   Long Arc Quad Weight 3 lbs.   Hamstring Curl AROM;Strengthening;Both;2 sets;10 reps  yellow theraband    Cryotherapy   Number Minutes Cryotherapy 10 Minutes   Cryotherapy Location Knee   Type of Cryotherapy Ice pack  bil   Manual Therapy   Joint Mobilization knee flexion mobs with distraction  grade 2-3 bil, knee extension with overpressure bil    Passive ROM knee flex with oscillations peformed bil                  PT Short Term Goals - 06/06/15 1145    PT SHORT TERM GOAL #1   Title independent with HEP (05/23/15)   Time 4   Period Weeks   Status Achieved   PT SHORT TERM GOAL #2   Title improve bil knee AROM 0-90 for improved function and mobility (05/23/15)   Time 4   Period Weeks   Status Achieved   PT SHORT TERM GOAL #3   Title ambulate > 150' with single point cane for improved function and mobility    Time 4   Period Weeks   Status Partially Met   PT SHORT TERM GOAL #4   Title report ability to stand > 20 min for improved strength and function    Time 4   Period Weeks   Status Achieved           PT Long Term Goals - 06/06/15 1145    PT LONG TERM GOAL #1   Title bil knee AROM 0-105 for improved functional mobility  (06/20/15)   Time 8   Period Weeks   Status Partially Met   PT LONG TERM GOAL #2   Title negotiate at least 3 stairs reciprocally without AD or rail for improved strength and function   Time 8   Period Weeks   Status On-going   PT LONG TERM GOAL #3   Title report ability to walk > 30 min without increase in pain for improved function    Time 8   Period Weeks   Status On-going   PT LONG TERM GOAL #4   Title demonstrate 10 reps SLR bil without extensor lag for improved strength   Time 8   Period Weeks   Status On-going               Plan - 06/09/15 1232    Clinical Impression Statement Vandana continues to report intermittent swelling in the knee causing her knee to stiffen up. She requires verbal cueing to bend her knees during gait. focused on strengthening of the the quads and hamstrings using lower weight to focus on working on full available ROM. started on functional squats today she required cueing for proper form  and used touching table for going to the right level each time. Utilized ice in bil knees following todays session to decrease pain.    PT Next Visit Plan continue knee/hip strengthening (con/ecc quad exercises), knee flexion ROM, gait with cane, can re-try Reformer. QUAD STRENGTH!!! continues to monitor flexion   Consulted and Agree with Plan of Care Patient        Problem List Patient Active Problem List   Diagnosis Date Noted  . Other bilateral secondary osteoarthritis of knee 04/03/2015  . Abnormality of gait 04/26/2014  . 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 Family history of diabetes mellitus (DM) 01/23/2014  . Right knee DJD 01/23/2014  . Hyperlipidemia 01/23/2014  . HYPERLIPIDEMIA 08/14/2007  . DEPRESSION 08/14/2007  . GERD 08/14/2007  . OSTEOARTHRITIS 08/14/2007  . HEADACHE, CHRONIC 08/14/2007   [name] PT, DPT, LAT, ATC  06/09/2015  12:49 PM     [facility] [address] [place], [place], [address] Phone: [phone]   Fax:  [phone]  Name: Noami Bove MRN: [id number] Date of Birth: 09-19-1965

## 2015-06-11 ENCOUNTER — Ambulatory Visit: Payer: Self-pay | Admitting: Physical Therapy

## 2015-06-11 DIAGNOSIS — M25669 Stiffness of unspecified knee, not elsewhere classified: Secondary | ICD-10-CM

## 2015-06-11 DIAGNOSIS — M25561 Pain in right knee: Secondary | ICD-10-CM

## 2015-06-11 DIAGNOSIS — M25662 Stiffness of left knee, not elsewhere classified: Secondary | ICD-10-CM

## 2015-06-11 DIAGNOSIS — R269 Unspecified abnormalities of gait and mobility: Secondary | ICD-10-CM

## 2015-06-11 DIAGNOSIS — M25562 Pain in left knee: Principal | ICD-10-CM

## 2015-06-11 DIAGNOSIS — R600 Localized edema: Secondary | ICD-10-CM

## 2015-06-11 DIAGNOSIS — M25661 Stiffness of right knee, not elsewhere classified: Secondary | ICD-10-CM

## 2015-06-11 NOTE — Therapy (Signed)
[place], [place], [address] Phone: [phone]   Fax:  [phone]  Physical Therapy Treatment  Patient Details  Name: Tina Johnson MRN: [id number] Date of Birth: [birth year] Referring Provider: Dr Meridee Score / Dr. Erlinda Hong  Encounter Date: 06/11/2015      PT End of Session - 06/11/15 1247    Visit Number 21   Number of Visits 24   Date for PT Re-Evaluation 06/20/15   Authorization Type Self-Pay   PT Start Time 1149   PT Stop Time 1236   PT Time Calculation (min) 47 min   Activity Tolerance Patient tolerated treatment well   Behavior During Therapy [facility] for tasks assessed/performed      Past Medical History  Diagnosis Date  . Anxiety     controls with exercise   . Indigestion     no medicine at present  . Arthritis     knees , shoulders    Past Surgical History  Procedure Laterality Date  . Abdominal hysterectomy    . Total abdominal hysterectomy w/ bilateral salpingoophorectomy    . Vaginal delivery      ?x2  . Total knee arthroplasty Right 04/03/2015    Procedure: RIGHT TOTAL KNEE ARTHROPLASTY;  Surgeon: Leandrew Koyanagi, MD;  Location: [place];  Service: Orthopedics;  Laterality: Right;  . Total knee arthroplasty Left 04/03/2015    Procedure: LEFT TOTAL KNEE ARTHROPLASTY;  Surgeon: Leandrew Koyanagi, MD;  Location: [place];  Service: Orthopedics;  Laterality: Left;    There were no vitals filed for this visit.  Visit Diagnosis:  Arthralgia of both knees  Decreased range of motion (ROM) of knee  Knee stiffness, left  Abnormality of gait  Localized edema  Knee stiffness, right      Subjective Assessment - 06/11/15 1153    Subjective I am still feeling swollen still but doing better.    Currently in Pain? No/denies   Aggravating Factors  bending knees, sitting for a long time.    Pain Relieving Factors the exercises            [facility] PT Assessment - 06/11/15 0001    AROM    Right Knee Flexion 96   Left Knee Flexion 106                     [facility] Adult PT Treatment/Exercise - 06/11/15 0001    Ambulation/Gait   Gait Comments encouraging knee flexion iwth gait.    Knee/Hip Exercises: Aerobic   Nustep L 5 x 6 min   Knee/Hip Exercises: Standing   Lateral Step Up 10 reps;Step Height: 4";Hand Hold: 1;1 set   Knee/Hip Exercises: Seated   Long Arc Quad AROM;Strengthening;Both;2 sets;10 reps;Weights   Long Arc Quad Weight 3 lbs.   Heel Slides AAROM;Both;1 set;10 reps   Knee/Hip Exercises: Supine   Heel Slides AAROM;Both;1 set;10 reps   Cryotherapy   Number Minutes Cryotherapy 10 Minutes   Cryotherapy Location Knee   Type of Cryotherapy Ice pack  bilateral                PT Education - 06/11/15 1246    Education provided Yes   Education Details gait training, encouraging HEP   Person(s) Educated Patient   Methods Explanation;Verbal cues   Comprehension Verbalized understanding          PT Short Term Goals - 06/06/15 1145    PT SHORT TERM GOAL #1   Title  independent with HEP (05/23/15)   Time 4   Period Weeks   Status Achieved   PT SHORT TERM GOAL #2   Title improve bil knee AROM 0-90 for improved function and mobility (05/23/15)   Time 4   Period Weeks   Status Achieved   PT SHORT TERM GOAL #3   Title ambulate > 150' with single point cane for improved function and mobility    Time 4   Period Weeks   Status Partially Met   PT SHORT TERM GOAL #4   Title report ability to stand > 20 min for improved strength and function    Time 4   Period Weeks   Status Achieved           PT Long Term Goals - 06/06/15 1145    PT LONG TERM GOAL #1   Title bil knee AROM 0-105 for improved functional mobility (06/20/15)   Time 8   Period Weeks   Status Partially Met   PT LONG TERM GOAL #2   Title negotiate at least 3 stairs reciprocally without AD or rail for improved strength and function   Time 8   Period Weeks   Status On-going    PT LONG TERM GOAL #3   Title report ability to walk > 30 min without increase in pain for improved function    Time 8   Period Weeks   Status On-going   PT LONG TERM GOAL #4   Title demonstrate 10 reps SLR bil without extensor lag for improved strength   Time 8   Period Weeks   Status On-going               Plan - 06/11/15 1248    Clinical Impression Statement Patient making gradual progress with PT regarding knee ROM and strengthening. Continue to encourage HEP with knee ROM and strengthening. Cues needed to avoid compensations with functional tasks.    PT Next Visit Plan continue to progress with ROM and strengthening.    PT Home Exercise Plan reinforce stretch with home exercises.    Consulted and Agree with Plan of Care Patient        Problem List Patient Active Problem List   Diagnosis Date Noted  . Other bilateral secondary osteoarthritis of knee 04/03/2015  . Abnormality of gait 04/26/2014  . 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 Family history of diabetes mellitus (DM) 01/23/2014  . Right knee DJD 01/23/2014  . Hyperlipidemia 01/23/2014  . HYPERLIPIDEMIA 08/14/2007  . DEPRESSION 08/14/2007  . GERD 08/14/2007  . OSTEOARTHRITIS 08/14/2007  . HEADACHE, CHRONIC 08/14/2007    Cassell Clement, PT, CSCS Pager [phone]  06/11/2015, 12:52 PM  [facility] [address] [place], [place], [address] Phone: [phone]   Fax:  [phone]  Name: Tina Johnson MRN: [id number] Date of Birth: 22-Jun-1965

## 2015-06-13 ENCOUNTER — Ambulatory Visit: Payer: Self-pay | Admitting: Physical Therapy

## 2015-06-13 DIAGNOSIS — M25662 Stiffness of left knee, not elsewhere classified: Secondary | ICD-10-CM

## 2015-06-13 DIAGNOSIS — R269 Unspecified abnormalities of gait and mobility: Secondary | ICD-10-CM

## 2015-06-13 DIAGNOSIS — M25561 Pain in right knee: Secondary | ICD-10-CM

## 2015-06-13 DIAGNOSIS — M25661 Stiffness of right knee, not elsewhere classified: Secondary | ICD-10-CM

## 2015-06-13 DIAGNOSIS — M25562 Pain in left knee: Principal | ICD-10-CM

## 2015-06-13 DIAGNOSIS — R600 Localized edema: Secondary | ICD-10-CM

## 2015-06-13 DIAGNOSIS — M25669 Stiffness of unspecified knee, not elsewhere classified: Secondary | ICD-10-CM

## 2015-06-13 NOTE — Therapy (Signed)
[place], [place], [address] Phone: [phone]   Fax:  [phone]  Physical Therapy Treatment  Patient Details  Name: Tina Johnson MRN: [id number] Date of Birth: [birth year] Referring Provider: Dr Meridee Score / Dr. Erlinda Hong  Encounter Date: 06/13/2015      PT End of Session - 06/13/15 1202    Visit Number 22   Number of Visits 24   Date for PT Re-Evaluation 06/20/15   Authorization Type Self-Pay   Activity Tolerance Patient tolerated treatment well   Behavior During Therapy [facility] for tasks assessed/performed      Past Medical History  Diagnosis Date  . Anxiety     controls with exercise   . Indigestion     no medicine at present  . Arthritis     knees , shoulders    Past Surgical History  Procedure Laterality Date  . Abdominal hysterectomy    . Total abdominal hysterectomy w/ bilateral salpingoophorectomy    . Vaginal delivery      ?x2  . Total knee arthroplasty Right 04/03/2015    Procedure: RIGHT TOTAL KNEE ARTHROPLASTY;  Surgeon: Leandrew Koyanagi, MD;  Location: [place];  Service: Orthopedics;  Laterality: Right;  . Total knee arthroplasty Left 04/03/2015    Procedure: LEFT TOTAL KNEE ARTHROPLASTY;  Surgeon: Leandrew Koyanagi, MD;  Location: [place];  Service: Orthopedics;  Laterality: Left;    There were no vitals filed for this visit.  Visit Diagnosis:  Arthralgia of both knees  Decreased range of motion (ROM) of knee  Knee stiffness, left  Abnormality of gait  Localized edema  Knee stiffness, right      Subjective Assessment - 06/13/15 1014    Subjective "I still have swelling but have to still work at getting it moving" she reports from concentrating on walking so much she got dizzy the other day.    Currently in Pain? No/denies            [facility] PT Assessment - 06/13/15 1037    AROM   Right Knee Flexion 90  92   Left Knee Flexion 101                      [facility] Adult PT Treatment/Exercise - 06/13/15 1031    Knee/Hip Exercises: Aerobic   Nustep L 6 x 6  min  with seat closer   Knee/Hip Exercises: Standing   Lateral Step Up 10 reps;Hand Hold: 1;1 set;Step Height: 6"  weight shifting forward   Functional Squat 1 set;10 reps   Functional Squat Limitations using table as feedback to go to right level each time.    Knee/Hip Exercises: Seated   Long Arc Quad AROM;Strengthening;Both;2 sets;10 reps;Weights   Long Arc Quad Weight 3 lbs.   Hamstring Curl AROM;Strengthening;Both;2 sets;10 reps   Manual Therapy   Joint Mobilization knee flexion mobs with distraction grade 2-3 bil, knee extension with overpressure bil    Passive ROM knee flex with oscillations peformed bil                PT Education - 06/13/15 1202    Education provided Yes   Education Details updated HEP   Person(s) Educated Patient   Methods Explanation   Comprehension Verbalized understanding          PT Short Term Goals - 06/06/15 1145    PT SHORT TERM GOAL #1   Title independent with HEP (05/23/15)   Time  4   Period Weeks   Status Achieved   PT SHORT TERM GOAL #2   Title improve bil knee AROM 0-90 for improved function and mobility (05/23/15)   Time 4   Period Weeks   Status Achieved   PT SHORT TERM GOAL #3   Title ambulate > 150' with single point cane for improved function and mobility    Time 4   Period Weeks   Status Partially Met   PT SHORT TERM GOAL #4   Title report ability to stand > 20 min for improved strength and function    Time 4   Period Weeks   Status Achieved           PT Long Term Goals - 06/06/15 1145    PT LONG TERM GOAL #1   Title bil knee AROM 0-105 for improved functional mobility (06/20/15)   Time 8   Period Weeks   Status Partially Met   PT LONG TERM GOAL #2   Title negotiate at least 3 stairs reciprocally without AD or rail for improved strength and function   Time 8   Period Weeks   Status On-going   PT LONG TERM  GOAL #3   Title report ability to walk > 30 min without increase in pain for improved function    Time 8   Period Weeks   Status On-going   PT LONG TERM GOAL #4   Title demonstrate 10 reps SLR bil without extensor lag for improved strength   Time 8   Period Weeks   Status On-going               Plan - 06/13/15 1202    Clinical Impression Statement Aliani reports working at home and feels like she is able to bend her knee more. Continue strengthening knee extensors and flexors working toward closed kinetic chain activities. Worked on step up with weight shift leans which she performed well to improve knee flexion. Updated HEP to include band knee extension where she activites contralateral hamstrings while extending the other knee.    PT Next Visit Plan continue to progress with ROM and strengthening. FOTO, goals, Renewal   PT Home Exercise Plan seated knee extension with band (around ankles to facilitate hamstring activiation of other leg)   Consulted and Agree with Plan of Care Patient        Problem List Patient Active Problem List   Diagnosis Date Noted  . Other bilateral secondary osteoarthritis of knee 04/03/2015  . Abnormality of gait 04/26/2014  . 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 Family history of diabetes mellitus (DM) 01/23/2014  . Right knee DJD 01/23/2014  . Hyperlipidemia 01/23/2014  . HYPERLIPIDEMIA 08/14/2007  . DEPRESSION 08/14/2007  . GERD 08/14/2007  . OSTEOARTHRITIS 08/14/2007  . HEADACHE, CHRONIC 08/14/2007   [name] PT, DPT, LAT, ATC  06/13/2015  12:17 PM       [facility] [address] [place], [place], [address] Phone: [phone]   Fax:  [phone]  Name: Tina Johnson MRN: [id number] Date of Birth: 09/09/1965

## 2015-06-16 ENCOUNTER — Ambulatory Visit: Payer: Self-pay | Admitting: Physical Therapy

## 2015-06-16 DIAGNOSIS — M25669 Stiffness of unspecified knee, not elsewhere classified: Secondary | ICD-10-CM

## 2015-06-16 DIAGNOSIS — M25561 Pain in right knee: Secondary | ICD-10-CM

## 2015-06-16 DIAGNOSIS — M25662 Stiffness of left knee, not elsewhere classified: Secondary | ICD-10-CM

## 2015-06-16 DIAGNOSIS — R269 Unspecified abnormalities of gait and mobility: Secondary | ICD-10-CM

## 2015-06-16 DIAGNOSIS — M25562 Pain in left knee: Principal | ICD-10-CM

## 2015-06-16 DIAGNOSIS — R600 Localized edema: Secondary | ICD-10-CM

## 2015-06-16 DIAGNOSIS — M25661 Stiffness of right knee, not elsewhere classified: Secondary | ICD-10-CM

## 2015-06-16 NOTE — Therapy (Signed)
[place], [place], [address] Phone: [phone]   Fax:  [phone]  Physical Therapy Treatment  Patient Details  Name: Tina Johnson MRN: [id number] Date of Birth: [birth year] Referring Provider: Dr Meridee Score / Dr. Erlinda Hong  Encounter Date: 06/16/2015      PT End of Session - 06/16/15 1123    Visit Number 23   Number of Visits 24   Date for PT Re-Evaluation 06/20/15   PT Start Time 4709   PT Stop Time 1115   PT Time Calculation (min) 60 min      Past Medical History  Diagnosis Date  . Anxiety     controls with exercise   . Indigestion     no medicine at present  . Arthritis     knees , shoulders    Past Surgical History  Procedure Laterality Date  . Abdominal hysterectomy    . Total abdominal hysterectomy w/ bilateral salpingoophorectomy    . Vaginal delivery      ?x2  . Total knee arthroplasty Right 04/03/2015    Procedure: RIGHT TOTAL KNEE ARTHROPLASTY;  Surgeon: Leandrew Koyanagi, MD;  Location: [place];  Service: Orthopedics;  Laterality: Right;  . Total knee arthroplasty Left 04/03/2015    Procedure: LEFT TOTAL KNEE ARTHROPLASTY;  Surgeon: Leandrew Koyanagi, MD;  Location: [place];  Service: Orthopedics;  Laterality: Left;    There were no vitals filed for this visit.  Visit Diagnosis:  Arthralgia of both knees  Decreased range of motion (ROM) of knee  Knee stiffness, left  Abnormality of gait  Localized edema  Knee stiffness, right      Subjective Assessment - 06/16/15 1122    Subjective I want to stop the walker stretch exercises. It makes me worse and I cannot do anything else after that exercise. It makes me swollen.  I am swollen now.    Currently in Pain? No/denies                         [facility] Adult PT Treatment/Exercise - 06/16/15 0001    Knee/Hip Exercises: Standing   Forward Step Up 1 set;10 reps;Step Height: 6";Hand Hold: 1   Knee/Hip Exercises:  Seated   Long Arc Quad AAROM   Long [employer] Limitations unable to achieve extension independenly- unable to hold   Other Seated Knee/Hip Exercises seated fitter TKE with hold x 15 each 1 blue a black, TKE with heel on floor and quad contract 20 sex x 4 each   Cryotherapy   Number Minutes Cryotherapy 15 Minutes   Cryotherapy Location Knee   Type of Cryotherapy Ice pack  bilat                  PT Short Term Goals - 06/06/15 1145    PT SHORT TERM GOAL #1   Title independent with HEP (05/23/15)   Time 4   Period Weeks   Status Achieved   PT SHORT TERM GOAL #2   Title improve bil knee AROM 0-90 for improved function and mobility (05/23/15)   Time 4   Period Weeks   Status Achieved   PT SHORT TERM GOAL #3   Title ambulate > 150' with single point cane for improved function and mobility    Time 4   Period Weeks   Status Partially Met   PT SHORT TERM GOAL #4   Title report ability to stand > 20  min for improved strength and function    Time 4   Period Weeks   Status Achieved           PT Long Term Goals - 06/06/15 1145    PT LONG TERM GOAL #1   Title bil knee AROM 0-105 for improved functional mobility (06/20/15)   Time 8   Period Weeks   Status Partially Met   PT LONG TERM GOAL #2   Title negotiate at least 3 stairs reciprocally without AD or rail for improved strength and function   Time 8   Period Weeks   Status On-going   PT LONG TERM GOAL #3   Title report ability to walk > 30 min without increase in pain for improved function    Time 8   Period Weeks   Status On-going   PT LONG TERM GOAL #4   Title demonstrate 10 reps SLR bil without extensor lag for improved strength   Time 8   Period Weeks   Status On-going               Plan - 06/16/15 1206    Clinical Impression Statement Pt presents with increased swelling. She thinks the knee flexion stretching using the RW for leverage is too painful and causes set backs due to increased swelling.  Recommended pt dc this stretch for now. SHe feels she cannot do any other exercises after she does that stretch. Attempted seated LAQ with-30 extension bilateral. Assist given and pt unable to hold LAQ. Seated TKE with heel on floor with pt visualizing knee cap and quad movement for feedback. 5 reps 20 seconds each. Used fitter 1 blue and 1 black for seated TKE with good quad activatio. Progressed to 6 inch step ups with good form after tactile and verbal cues. Asked pt to work on at home. She has bilateral handrails.    PT Next Visit Plan continue to progress with ROM and strengthening. FOTO, goals, Renewal        Problem List Patient Active Problem List   Diagnosis Date Noted  . Other bilateral secondary osteoarthritis of knee 04/03/2015  . Abnormality of gait 04/26/2014  . 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 Family history of diabetes mellitus (DM) 01/23/2014  . Right knee DJD 01/23/2014  . Hyperlipidemia 01/23/2014  . HYPERLIPIDEMIA 08/14/2007  . DEPRESSION 08/14/2007  . GERD 08/14/2007  . OSTEOARTHRITIS 08/14/2007  . HEADACHE, CHRONIC 08/14/2007    Dorene Ar, PTA 06/16/2015, 12:11 PM  [facility] [address] [place], [place], [address] Phone: [phone]   Fax:  [phone]  Name: Tina Johnson MRN: [id number] Date of Birth: 1965/06/10

## 2015-06-18 ENCOUNTER — Ambulatory Visit: Payer: Self-pay | Attending: Orthopedic Surgery | Admitting: Physical Therapy

## 2015-06-18 DIAGNOSIS — M25669 Stiffness of unspecified knee, not elsewhere classified: Secondary | ICD-10-CM

## 2015-06-18 DIAGNOSIS — R269 Unspecified abnormalities of gait and mobility: Secondary | ICD-10-CM

## 2015-06-18 DIAGNOSIS — M24669 Ankylosis, unspecified knee: Secondary | ICD-10-CM | POA: Insufficient documentation

## 2015-06-18 DIAGNOSIS — M25561 Pain in right knee: Secondary | ICD-10-CM

## 2015-06-18 DIAGNOSIS — R600 Localized edema: Secondary | ICD-10-CM

## 2015-06-18 DIAGNOSIS — M25661 Stiffness of right knee, not elsewhere classified: Secondary | ICD-10-CM

## 2015-06-18 DIAGNOSIS — M25662 Stiffness of left knee, not elsewhere classified: Secondary | ICD-10-CM

## 2015-06-18 DIAGNOSIS — M25562 Pain in left knee: Secondary | ICD-10-CM | POA: Insufficient documentation

## 2015-06-19 NOTE — Therapy (Signed)
[place], [place], [address] Phone: [phone]   Fax:  [phone]  Physical Therapy Treatment  Patient Details  Name: Tina Johnson MRN: [id number] Date of Birth: [birth year] Referring Provider: Dr Meridee Score / Dr. Erlinda Hong  Encounter Date: 06/18/2015      PT End of Session - 06/18/15 1750    Visit Number 24   Number of Visits 24   Date for PT Re-Evaluation 06/20/15   PT Start Time 1330  longer session 1.5 2 appointments back to back   PT Stop Time 1510   PT Time Calculation (min) 100 min   Activity Tolerance Patient tolerated treatment well   Behavior During Therapy [facility] for tasks assessed/performed      Past Medical History  Diagnosis Date  . Anxiety     controls with exercise   . Indigestion     no medicine at present  . Arthritis     knees , shoulders    Past Surgical History  Procedure Laterality Date  . Abdominal hysterectomy    . Total abdominal hysterectomy w/ bilateral salpingoophorectomy    . Vaginal delivery      ?x2  . Total knee arthroplasty Right 04/03/2015    Procedure: RIGHT TOTAL KNEE ARTHROPLASTY;  Surgeon: Leandrew Koyanagi, MD;  Location: [place];  Service: Orthopedics;  Laterality: Right;  . Total knee arthroplasty Left 04/03/2015    Procedure: LEFT TOTAL KNEE ARTHROPLASTY;  Surgeon: Leandrew Koyanagi, MD;  Location: [place];  Service: Orthopedics;  Laterality: Left;    There were no vitals filed for this visit.  Visit Diagnosis:  Arthralgia of both knees  Decreased range of motion (ROM) of knee  Knee stiffness, left  Abnormality of gait  Localized edema  Knee stiffness, right      Subjective Assessment - 06/18/15 1338    Subjective Swelling is main problem. .  Pain up to 5 /10 .  Able to do step over step 2-3 steps.    Currently in Pain? No/denies   Pain Score 5    Pain Location Knee   Pain Orientation Right;Left   Pain Descriptors / Indicators Aching;Sharp   Pain Frequency Intermittent   Aggravating Factors  bending   Pain Relieving Factors medication, exercises                         [facility] Adult PT Treatment/Exercise - 06/18/15 1341    Ambulation/Gait   Assistive device Straight cane   Gait Comments cues, instruction to use the muscles and range she has.     Self-Care   Other Self-Care Comments  How to do retromassage.  How to work on stretching,  things to do before bed to promote relaxed knees.  answers about scar tissue.  How to mobilize patella   Knee/Hip Exercises: Stretches   Sports administrator Both;10 seconds   Knee/Hip Exercises: Seated   Long Arc Quad Limitations 5 minutes each with [place] stim.   Knee/Hip Exercises: Supine   Quad Sets --  5 minutes each with [place] stim   Short Arc [employer] --  5 minutes each with [place] stim   Terminal Knee Extension Limitations prone 10 X 5 seconds each   Cryotherapy   Number Minutes Cryotherapy 10 Minutes   Cryotherapy Location Knee  both   Type of Cryotherapy --  cold packs   Electrical Stimulation   Electrical Stimulation Location thigh, both quads   Electrical  Stimulation Action Actor Goals Neuromuscular facilitation  quad sets, SAQ, LAQ's   Manual Therapy   Joint Mobilization Strap for flexion                PT Education - 06/19/15 0730    Education provided Yes   Education Details Multiple self care patient husband.  Many questions asked and answered.    Person(s) Educated Patient;Spouse   Methods Explanation;Demonstration;Tactile cues;Verbal cues   Comprehension Verbalized understanding;Returned demonstration          PT Short Term Goals - 06/18/15 1450    PT SHORT TERM GOAL #1   Title independent with HEP (05/23/15)   Time 4   Period Weeks   Status Achieved   PT SHORT TERM GOAL #2   Title improve bil knee AROM 0-90 for improved function and mobility (05/23/15)   Time 4    Period Weeks   Status Achieved   PT SHORT TERM GOAL #3   Title ambulate > 150' with single point cane for improved function and mobility    Baseline cues   Time 4   Period Weeks   Status Partially Met   PT SHORT TERM GOAL #4   Title report ability to stand > 20 min for improved strength and function    Time 4   Period Weeks   Status Achieved           PT Long Term Goals - 06/19/15 4696    PT LONG TERM GOAL #1   Title bil knee AROM 0-105 for improved functional mobility (06/20/15)   Baseline RT 90,  LT 100   Time 8   Period Weeks   Status Partially Met   PT LONG TERM GOAL #2   Title negotiate at least 3 stairs reciprocally without AD or rail for improved strength and function   Baseline has done 1 time at home   Time 8   Period Weeks   Status On-going   PT LONG TERM GOAL #3   Title report ability to walk > 30 min without increase in pain for improved function    Time 8   Period Weeks   Status On-going   PT LONG TERM GOAL #4   Title demonstrate 10 reps SLR bil without extensor lag for improved strength   Baseline lag   Time 8   Period Weeks   Status On-going               Plan - 06/19/15 0830    Clinical Impression Statement Patient had a lot of questions about total knees and timelime.  AROM unchanged today even after getting increased PROM during session.  Improved quad contraction with [place] stim.  Patient says she brings her "Good face" here when inside she is worried about her knees.  Encouragement given.     PT Next Visit Plan continue to progress with ROM and strengthening. FOTO, goals, Renewal   PT Home Exercise Plan continue    Consulted and Agree with Plan of Care Patient;Family member/caregiver   Family Member Consulted husband        Problem List Patient Active Problem List   Diagnosis Date Noted  . Other bilateral secondary osteoarthritis of knee 04/03/2015  . Abnormality of gait 04/26/2014  . 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 Genu varum of  both lower extremities 03/07/2014  . Family history of diabetes mellitus (DM) 01/23/2014  . Right knee DJD  01/23/2014  . Hyperlipidemia 01/23/2014  . HYPERLIPIDEMIA 08/14/2007  . DEPRESSION 08/14/2007  . GERD 08/14/2007  . OSTEOARTHRITIS 08/14/2007  . HEADACHE, CHRONIC 08/14/2007    [facility] 06/19/2015, 8:38 AM  [facility] [address] [place], [place], [address] Phone: [phone]   Fax:  [phone]  Name: Tina Johnson MRN: [id number] Date of Birth: 1965/05/29    Melvenia Needles, PTA 06/19/2015 8:38 AM Phone: [phone] Fax: [phone]

## 2015-06-20 ENCOUNTER — Ambulatory Visit: Payer: Self-pay | Admitting: Physical Therapy

## 2015-06-20 DIAGNOSIS — R600 Localized edema: Secondary | ICD-10-CM

## 2015-06-20 DIAGNOSIS — M25669 Stiffness of unspecified knee, not elsewhere classified: Secondary | ICD-10-CM

## 2015-06-20 DIAGNOSIS — M25661 Stiffness of right knee, not elsewhere classified: Secondary | ICD-10-CM

## 2015-06-20 DIAGNOSIS — M25561 Pain in right knee: Secondary | ICD-10-CM

## 2015-06-20 DIAGNOSIS — M25662 Stiffness of left knee, not elsewhere classified: Secondary | ICD-10-CM

## 2015-06-20 DIAGNOSIS — R269 Unspecified abnormalities of gait and mobility: Secondary | ICD-10-CM

## 2015-06-20 DIAGNOSIS — M25562 Pain in left knee: Principal | ICD-10-CM

## 2015-06-20 NOTE — Therapy (Signed)
[place], [place], [address] Phone: [phone]   Fax:  [phone]  Physical Therapy Treatment / Re-certification  Patient Details  Name: Tina Johnson MRN: [id number] Date of Birth: 1965/11/02 Referring Provider: Dr Meridee Score / Dr. Erlinda Hong  Encounter Date: 06/20/2015      PT End of Session - 06/20/15 1217    Visit Number 25   Number of Visits 35   Date for PT Re-Evaluation 07/25/15   Authorization Type Self-Pay   PT Start Time 1020   PT Stop Time 1110   PT Time Calculation (min) 50 min   Activity Tolerance Patient tolerated treatment well   Behavior During Therapy [facility] for tasks assessed/performed      Past Medical History  Diagnosis Date  . Anxiety     controls with exercise   . Indigestion     no medicine at present  . Arthritis     knees , shoulders    Past Surgical History  Procedure Laterality Date  . Abdominal hysterectomy    . Total abdominal hysterectomy w/ bilateral salpingoophorectomy    . Vaginal delivery      ?x2  . Total knee arthroplasty Right 04/03/2015    Procedure: RIGHT TOTAL KNEE ARTHROPLASTY;  Surgeon: Leandrew Koyanagi, MD;  Location: [place];  Service: Orthopedics;  Laterality: Right;  . Total knee arthroplasty Left 04/03/2015    Procedure: LEFT TOTAL KNEE ARTHROPLASTY;  Surgeon: Leandrew Koyanagi, MD;  Location: [place];  Service: Orthopedics;  Laterality: Left;    There were no vitals filed for this visit.  Visit Diagnosis:  Arthralgia of both knees  Decreased range of motion (ROM) of knee  Knee stiffness, left  Localized edema  Abnormality of gait  Knee stiffness, right      Subjective Assessment - 06/20/15 1022    Subjective " I have some soreness but only when I am bending the kne at 3/10"   Currently in Pain? Yes   Pain Score 3    Pain Location Knee   Pain Orientation Right;Left   Pain Type Surgical pain   Pain Onset More than a month ago   Pain  Frequency Intermittent            [facility] PT Assessment - 06/20/15 0001    Observation/Other Assessments   Focus on Therapeutic Outcomes (FOTO)  59% limited   AROM   Right Knee Extension -7   Right Knee Flexion 91   Left Knee Extension -4   Left Knee Flexion 99   PROM   Right/Left Knee Right;Left   Right Knee Extension -3   Right Knee Flexion 99   Left Knee Extension 0   Left Knee Flexion 106   Strength   Right Hip Flexion 4-/5   Right Hip Extension 3+/5   Right Hip ABduction 4-/5   Right Hip ADduction 4/5   Left Hip Flexion 4-/5   Left Hip Extension 3+/5   Left Hip ABduction 4-/5   Left Hip ADduction 4/5   Right Knee Flexion 3+/5  in available ROM   Right Knee Extension 3/5   Left Knee Flexion 3+/5  in available ROM   Left Knee Extension 3/5  due to limited ROM                     [facility] Adult PT Treatment/Exercise - 06/20/15 1027    Knee/Hip Exercises: Aerobic   Nustep L3 x 10 min (moving seat  closer 1 click every 3 mintues, using 2 pillows last time due to seat reaching max length)  to improve ROM gradually, pt reported it helped   Knee/Hip Exercises: Standing   Wall Squat 1 set;10 reps  increased fatigue at rep 6 but able to complete exercise                PT Education - 06/20/15 1217    Education provided Yes   Education Details HEP review   Person(s) Educated Patient   Methods Explanation   Comprehension Verbalized understanding          PT Short Term Goals - 06/20/15 1054    PT SHORT TERM GOAL #1   Title independent with HEP (05/23/15)   Time 4   Period Weeks   Status Achieved   PT SHORT TERM GOAL #2   Title improve bil knee AROM 0-90 for improved function and mobility (05/23/15)   Time 4   Period Weeks   Status Achieved   PT SHORT TERM GOAL #3   Title ambulate > 150' with single point cane for improved function and mobility    Time 4   Period Weeks   Status Achieved   PT SHORT TERM GOAL #4   Title report ability to  stand > 20 min for improved strength and function    Time 4   Period Weeks   Status Achieved           PT Long Term Goals - 06/20/15 1054    PT LONG TERM GOAL #1   Title bil knee AROM 0-105 for improved functional mobility (06/20/15)   Time 8   Period Weeks   Status Partially Met   PT LONG TERM GOAL #2   Title negotiate at least 3 stairs reciprocally without AD or rail for improved strength and function   Baseline has done 1 time at home   Time 8   Period Weeks   Status On-going   PT LONG TERM GOAL #3   Title report ability to walk > 30 min without increase in pain for improved function    Baseline 20 min   Time 8   Period Weeks   Status On-going   PT LONG TERM GOAL #4   Title demonstrate 10 reps SLR bil without extensor lag for improved strength   Time 8   Period Weeks   Status On-going               Plan - 06/20/15 1218    Clinical Impression Statement Tina Johnson has been making gradual steady progress with physical therapy. However, she continues to exhibit limitations with bil knee flexion with R>L. R knee flexion is 90 degrees and L 99 actively. She reports staying consistent with her HEP but has complaints of intermittent swelling that effects her progress. she continues to exhibit an antalgic gait pattern requiring cues to bendin her knees when she walks. She sees her physician on 06/26/2015. she met all her STG today, no progress has been made on her LTG. plan to conitnue with therapy to work toward remaining goals, and drop down to 2 times a week to work toward independent exercise.    PT Frequency 3x / week   PT Duration 4 weeks   PT Next Visit Plan continue to progress with ROM and strengthening, Gait training, see what MD says on 06/26/2015,  due to reaching higher visit # due to pt being self pay how she would like to proceed?  PT Home Exercise Plan HEP review   Consulted and Agree with Plan of Care Patient        Problem List Patient Active Problem  List   Diagnosis Date Noted  . Other bilateral secondary osteoarthritis of knee 04/03/2015  . Abnormality of gait 04/26/2014  . 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 Family history of diabetes mellitus (DM) 01/23/2014  . Right knee DJD 01/23/2014  . Hyperlipidemia 01/23/2014  . HYPERLIPIDEMIA 08/14/2007  . DEPRESSION 08/14/2007  . GERD 08/14/2007  . OSTEOARTHRITIS 08/14/2007  . HEADACHE, CHRONIC 08/14/2007   [name] PT, DPT, LAT, ATC  06/20/2015  12:29 PM     [facility] [facility] [address] [place], [place], [address] Phone: [phone]   Fax:  [phone]  Name: Tina Johnson MRN: [id number] Date of Birth: [birth year]

## 2015-06-23 ENCOUNTER — Ambulatory Visit: Payer: Self-pay | Admitting: Physical Therapy

## 2015-06-23 DIAGNOSIS — M25561 Pain in right knee: Secondary | ICD-10-CM

## 2015-06-23 DIAGNOSIS — R600 Localized edema: Secondary | ICD-10-CM

## 2015-06-23 DIAGNOSIS — R269 Unspecified abnormalities of gait and mobility: Secondary | ICD-10-CM

## 2015-06-23 DIAGNOSIS — M25661 Stiffness of right knee, not elsewhere classified: Secondary | ICD-10-CM

## 2015-06-23 DIAGNOSIS — M25669 Stiffness of unspecified knee, not elsewhere classified: Secondary | ICD-10-CM

## 2015-06-23 DIAGNOSIS — M25562 Pain in left knee: Principal | ICD-10-CM

## 2015-06-23 DIAGNOSIS — M25662 Stiffness of left knee, not elsewhere classified: Secondary | ICD-10-CM

## 2015-06-23 NOTE — Therapy (Signed)
[place], [place], [address] Phone: [phone]   Fax:  [phone]  Physical Therapy Treatment  Patient Details  Name: Tina Johnson MRN: [id number] Date of Birth: May 12, 1965 Referring Provider: Dr Meridee Score / Dr. Erlinda Hong  Encounter Date: 06/23/2015      PT End of Session - 06/23/15 1549    Visit Number 26   Number of Visits 35   Date for PT Re-Evaluation 07/25/15   PT Start Time 1020   PT Stop Time 1115   PT Time Calculation (min) 55 min      Past Medical History  Diagnosis Date  . Anxiety     controls with exercise   . Indigestion     no medicine at present  . Arthritis     knees , shoulders    Past Surgical History  Procedure Laterality Date  . Abdominal hysterectomy    . Total abdominal hysterectomy w/ bilateral salpingoophorectomy    . Vaginal delivery      ?x2  . Total knee arthroplasty Right 04/03/2015    Procedure: RIGHT TOTAL KNEE ARTHROPLASTY;  Surgeon: Leandrew Koyanagi, MD;  Location: [place];  Service: Orthopedics;  Laterality: Right;  . Total knee arthroplasty Left 04/03/2015    Procedure: LEFT TOTAL KNEE ARTHROPLASTY;  Surgeon: Leandrew Koyanagi, MD;  Location: [place];  Service: Orthopedics;  Laterality: Left;    There were no vitals filed for this visit.  Visit Diagnosis:  Arthralgia of both knees  Decreased range of motion (ROM) of knee  Knee stiffness, left  Localized edema  Abnormality of gait  Knee stiffness, right      Subjective Assessment - 06/23/15 1455    Subjective Doing better.  I don't care what WE work on today I need a lot of work.     Patient is accompained by: Interpreter   Currently in Pain? Yes   Pain Score 3    Pain Location Knee   Pain Orientation Right;Left   Pain Descriptors / Indicators Aching   Aggravating Factors  bending   Pain Relieving Factors medication, exercises.                         [facility]  Treatment/Exercise - 06/23/15 0001    Ambulation/Gait   Number of Stairs 16  rails, step over step. Extra time.   Gait Comments encouraged no cane in clinic.  multiple cues for technique  on level.  Gait improves with heel toe.  cues    High Level Balance   High Level Balance Activities Marching forwards  high knees forward,  long steps in reverse.  and other dynamic exercises.   High Level Balance Comments intermittant contact guard and cues needed for balance exercises.   Knee/Hip Exercises: Aerobic   Stationary Bike 5 minutes, able to make full revolutions   Knee/Hip Exercises: Standing   Forward Step Up 2 sets;10 reps;Step Height: 2";Step Height: 4"   Forward Step Up Limitations Encouraged hands free used intermittantly lowering RT, Close SBA    SLS with Vectors 5 X forward rewerse and to side each sliding pillowcase on floor.   Close SBA.  Intermittant hands needed for safety.    Knee/Hip Exercises: Seated   Stool Scoot - Round Trips 28 feet   Sit to Sand 3 sets;5 reps;with UE support  both and single  leg   Cryotherapy   Number Minutes Cryotherapy 10 Minutes  Cryotherapy Location Knee  both   Type of Cryotherapy --  cold packs, legs elevated                PT Education - 06/23/15 1548    Education provided Yes   Education Details gait training   Person(s) Educated Patient   Methods Explanation;Demonstration;Verbal cues   Comprehension Verbalized understanding;Returned demonstration;Need further instruction          PT Short Term Goals - 06/20/15 1054    PT SHORT TERM GOAL #1   Title independent with HEP (05/23/15)   Time 4   Period Weeks   Status Achieved   PT SHORT TERM GOAL #2   Title improve bil knee AROM 0-90 for improved function and mobility (05/23/15)   Time 4   Period Weeks   Status Achieved   PT SHORT TERM GOAL #3   Title ambulate > 150' with single point cane for improved function and mobility    Time 4   Period Weeks   Status Achieved   PT  SHORT TERM GOAL #4   Title report ability to stand > 20 min for improved strength and function    Time 4   Period Weeks   Status Achieved           PT Long Term Goals - 06/23/15 1553    PT LONG TERM GOAL #1   Title bil knee AROM 0-105 for improved functional mobility (06/20/15)   Time 8   Period Weeks   Status On-going   PT LONG TERM GOAL #2   Title negotiate at least 3 stairs reciprocally without AD or rail for improved strength and function   Baseline able to do 12 steps reciprocally with rails.  6".  can do without rails 2" in clinic with SBA   PT LONG TERM GOAL #3   Title report ability to walk > 30 min without increase in pain for improved function    Time 8   Period Weeks   Status On-going   PT LONG TERM GOAL #4   Title demonstrate 10 reps SLR bil without extensor lag for improved strength   Time 8   Period Weeks   Status Unable to assess               Plan - 06/23/15 1550    Clinical Impression Statement Functional activities today encouraging balance and control.  Challanging and motivating for patient.    PT Next Visit Plan MD NOTE, measure, update goals.   PT Home Exercise Plan continue   Consulted and Agree with Plan of Care Patient        Problem List Patient Active Problem List   Diagnosis Date Noted  . Other bilateral secondary osteoarthritis of knee 04/03/2015  . Abnormality of gait 04/26/2014  . 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 Family history of diabetes mellitus (DM) 01/23/2014  . Right knee DJD 01/23/2014  . Hyperlipidemia 01/23/2014  . HYPERLIPIDEMIA 08/14/2007  . DEPRESSION 08/14/2007  . GERD 08/14/2007  . OSTEOARTHRITIS 08/14/2007  . HEADACHE, CHRONIC 08/14/2007    [facility] 06/23/2015, 3:56 PM  [facility] [address] [place], [place], [address] Phone: [phone]   Fax:  [phone]  Name: Sarina Fout MRN:  [id number] Date of Birth: 12-Mar-1966    Melvenia Needles, PTA 06/23/2015 3:56 PM Phone: [phone] Fax: [phone]

## 2015-06-25 ENCOUNTER — Ambulatory Visit: Payer: Self-pay | Admitting: Physical Therapy

## 2015-06-25 ENCOUNTER — Encounter: Payer: No Typology Code available for payment source | Admitting: Physical Therapy

## 2015-06-25 DIAGNOSIS — M25661 Stiffness of right knee, not elsewhere classified: Secondary | ICD-10-CM

## 2015-06-25 DIAGNOSIS — M25562 Pain in left knee: Principal | ICD-10-CM

## 2015-06-25 DIAGNOSIS — R269 Unspecified abnormalities of gait and mobility: Secondary | ICD-10-CM

## 2015-06-25 DIAGNOSIS — M25561 Pain in right knee: Secondary | ICD-10-CM

## 2015-06-25 DIAGNOSIS — M25669 Stiffness of unspecified knee, not elsewhere classified: Secondary | ICD-10-CM

## 2015-06-25 DIAGNOSIS — M25662 Stiffness of left knee, not elsewhere classified: Secondary | ICD-10-CM

## 2015-06-25 DIAGNOSIS — R600 Localized edema: Secondary | ICD-10-CM

## 2015-06-25 NOTE — Therapy (Signed)
[place], [place], [address] Phone: [phone]   Fax:  [phone]  Physical Therapy Treatment  Patient Details  Name: Tina Johnson MRN: [id number] Date of Birth: Sep 20, 1965 Referring Provider: Dr Meridee Score / Dr. Erlinda Hong  Encounter Date: 06/25/2015      PT End of Session - 06/25/15 1841    Visit Number 27   Number of Visits 35   Date for PT Re-Evaluation 07/25/15   PT Start Time 1330   PT Stop Time 1425   PT Time Calculation (min) 55 min   Activity Tolerance Patient tolerated treatment well   Behavior During Therapy [facility] for tasks assessed/performed      Past Medical History  Diagnosis Date  . Anxiety     controls with exercise   . Indigestion     no medicine at present  . Arthritis     knees , shoulders    Past Surgical History  Procedure Laterality Date  . Abdominal hysterectomy    . Total abdominal hysterectomy w/ bilateral salpingoophorectomy    . Vaginal delivery      ?x2  . Total knee arthroplasty Right 04/03/2015    Procedure: RIGHT TOTAL KNEE ARTHROPLASTY;  Surgeon: Leandrew Koyanagi, MD;  Location: [place];  Service: Orthopedics;  Laterality: Right;  . Total knee arthroplasty Left 04/03/2015    Procedure: LEFT TOTAL KNEE ARTHROPLASTY;  Surgeon: Leandrew Koyanagi, MD;  Location: [place];  Service: Orthopedics;  Laterality: Left;    There were no vitals filed for this visit.  Visit Diagnosis:  Arthralgia of both knees  Decreased range of motion (ROM) of knee  Knee stiffness, left  Localized edema  Abnormality of gait  Knee stiffness, right      Subjective Assessment - 06/25/15 1346    Subjective I feel heavy and swollen.  No pain.  The medications she got last MD visit do not work.  She wants to ask the Md for her old medications back just for PT.   Patient is accompained by: Interpreter   Currently in Pain? No/denies   Pain Descriptors / Indicators Heaviness  stiff   Pain  Frequency Intermittent   Aggravating Factors  bending   Pain Relieving Factors medication, exercise            [facility] PT Assessment - 06/25/15 1403    AROM   Right Knee Extension -8   Right Knee Flexion 90   Left Knee Extension -5   Left Knee Flexion 100   PROM   Right Knee Flexion 105  With strap mobilization   Left Knee Flexion 110  With strap mobilization, sitting   Strength   Right Knee Flexion 4/5  within available range    Right Knee Extension 4/5   Left Knee Flexion 4/5   Left Knee Extension 4/5  within available range                     [facility] Adult PT Treatment/Exercise - 06/25/15 0001    Ambulation/Gait   Gait Comments moderate use of hands.  On level gait work with multiple areas heel toe, trunk rotations, knee flexion no cane on level  with close SBA  12 steps, 2 rails, step over step,  deviations.    High Level Balance   High Level Balance Activities Marching forwards  Other closed chain dynamic balance challanges. Close SBA   High Level Balance Comments stepping over forward, lateral steps over  6 inch beam SBA used cane and also without cane.    Knee/Hip Exercises: Aerobic   Nustep L5 6 minutes   Knee/Hip Exercises: Seated   Long Arc Quad AROM   Long Arc Quad Limitations 10   Other Seated Knee/Hip Exercises 10   Cryotherapy   Number Minutes Cryotherapy 10 Minutes   Cryotherapy Location Knee  both, legs elevated   Type of Cryotherapy --  cold pack   Manual Therapy   Joint Mobilization Mobilization with movement with strap Both knees     Passive ROM knee flexion                  PT Short Term Goals - 06/20/15 1054    PT SHORT TERM GOAL #1   Title independent with HEP (05/23/15)   Time 4   Period Weeks   Status Achieved   PT SHORT TERM GOAL #2   Title improve bil knee AROM 0-90 for improved function and mobility (05/23/15)   Time 4   Period Weeks   Status Achieved   PT SHORT TERM GOAL #3   Title ambulate > 150' with  single point cane for improved function and mobility    Time 4   Period Weeks   Status Achieved   PT SHORT TERM GOAL #4   Title report ability to stand > 20 min for improved strength and function    Time 4   Period Weeks   Status Achieved           PT Long Term Goals - 06/25/15 1849    PT LONG TERM GOAL #1   Title bil knee AROM 0-105 for improved functional mobility (06/20/15)   Baseline RT 90,  LT 100   Time 8   Period Weeks   Status On-going   PT LONG TERM GOAL #2   Title negotiate at least 3 stairs reciprocally without AD or rail for improved strength and function   Baseline able to do 12 steps reciprocally with rails.  6".  can do without rails 2" in clinic with SBA   Time 8   Period Weeks   Status On-going   PT LONG TERM GOAL #3   Title report ability to walk > 30 min without increase in pain for improved function    Baseline 20 min   Time 8   Period Weeks   Status On-going   PT LONG TERM GOAL #4   Title demonstrate 10 reps SLR bil without extensor lag for improved strength   Baseline lag   Time 8   Period Weeks   Status On-going               Plan - 06/25/15 1842    Clinical Impression Statement Arnold continues to make gradual gains with Range and strength. Gait improves in clinic with little carryover from visit to visit.  She uses cane in community and intermittantly inside at home.  Sore form stretching post session,  Stretching is painful during session .   PT Next Visit Plan try walking with pulleys, tape for edema.  More stretching. strength and balance.  See how MD visit goes.   PT Home Exercise Plan continue   Consulted and Agree with Plan of Care Patient        Problem List Patient Active Problem List   Diagnosis Date Noted  . Other bilateral secondary osteoarthritis of knee 04/03/2015  . Abnormality of gait 04/26/2014  . 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 Family history of diabetes  mellitus (DM) 01/23/2014  . Right knee DJD 01/23/2014  . Hyperlipidemia 01/23/2014  . HYPERLIPIDEMIA 08/14/2007  . DEPRESSION 08/14/2007  . GERD 08/14/2007  . OSTEOARTHRITIS 08/14/2007  . HEADACHE, CHRONIC 08/14/2007    [facility] 06/25/2015, 6:51 PM  [facility] [address] [place], [place], [address] Phone: [phone]   Fax:  [phone]  Name: Agda Cresswell MRN: [id number] Date of Birth: Mar 24, 1966  Melvenia Needles, PTA 06/25/2015 6:51 PM Phone: [phone] Fax: [phone]

## 2015-06-27 ENCOUNTER — Ambulatory Visit: Payer: Self-pay | Admitting: Physical Therapy

## 2015-06-27 DIAGNOSIS — R600 Localized edema: Secondary | ICD-10-CM

## 2015-06-27 DIAGNOSIS — M25562 Pain in left knee: Principal | ICD-10-CM

## 2015-06-27 DIAGNOSIS — M25662 Stiffness of left knee, not elsewhere classified: Secondary | ICD-10-CM

## 2015-06-27 DIAGNOSIS — M25561 Pain in right knee: Secondary | ICD-10-CM

## 2015-06-27 DIAGNOSIS — M25661 Stiffness of right knee, not elsewhere classified: Secondary | ICD-10-CM

## 2015-06-27 DIAGNOSIS — R269 Unspecified abnormalities of gait and mobility: Secondary | ICD-10-CM

## 2015-06-27 DIAGNOSIS — M25669 Stiffness of unspecified knee, not elsewhere classified: Secondary | ICD-10-CM

## 2015-06-27 NOTE — Therapy (Signed)
[place], [place], [address] Phone: [phone]   Fax:  [phone]  Physical Therapy Treatment  Patient Details  Name: Tina Johnson MRN: [id number] Date of Birth: [birth year] Referring Provider: Dr Meridee Score / Dr. Erlinda Hong  Encounter Date: 06/27/2015    Past Medical History  Diagnosis Date  . Anxiety     controls with exercise   . Indigestion     no medicine at present  . Arthritis     knees , shoulders    Past Surgical History  Procedure Laterality Date  . Abdominal hysterectomy    . Total abdominal hysterectomy w/ bilateral salpingoophorectomy    . Vaginal delivery      ?x2  . Total knee arthroplasty Right 04/03/2015    Procedure: RIGHT TOTAL KNEE ARTHROPLASTY;  Surgeon: Leandrew Koyanagi, MD;  Location: [place];  Service: Orthopedics;  Laterality: Right;  . Total knee arthroplasty Left 04/03/2015    Procedure: LEFT TOTAL KNEE ARTHROPLASTY;  Surgeon: Leandrew Koyanagi, MD;  Location: [place];  Service: Orthopedics;  Laterality: Left;    There were no vitals filed for this visit.  Visit Diagnosis:  Arthralgia of both knees  Decreased range of motion (ROM) of knee  Knee stiffness, left  Localized edema  Abnormality of gait  Knee stiffness, right      Subjective Assessment - 06/27/15 1015    Subjective " I saw the MD and he reported that I am doing well and he is very pleased with my progress" pt reports changing my medication due to feeling down and it making me feel depressed".    Currently in Pain? Yes   Pain Score 1    Pain Location Knee   Pain Orientation Right;Left   Pain Descriptors / Indicators Aching   Pain Type Surgical pain   Pain Onset More than a month ago   Pain Frequency Intermittent   Aggravating Factors  bending the knee            [facility] PT Assessment - 06/27/15 1034    AROM   Right Knee Extension -8   Right Knee Flexion 93   Left Knee Extension -4   Left Knee  Flexion 100                     [facility] Adult PT Treatment/Exercise - 06/27/15 1029    Knee/Hip Exercises: Aerobic   Nustep L4 x 12 minutes  moving seat closer every 3 minutes (using pillows to assist)   Knee/Hip Exercises: Machines for Strengthening   Cybex Leg Press 1 x 10 25#, 1 x10, 30#, 1 x 10 35#   Knee/Hip Exercises: Standing   Gait Training in // focus on heel strike and toe off x 6, stair training on 4 inch steps x 5 (cues to walk straight and bend the knees with descending steps)  cues to perform gait pattern equally bilaterally   Knee/Hip Exercises: Seated   Heel Slides AAROM;Both;1 set;10 reps  on floor   Sit to Sand 2 sets;10 reps                  PT Short Term Goals - 06/20/15 1054    PT SHORT TERM GOAL #1   Title independent with HEP (05/23/15)   Time 4   Period Weeks   Status Achieved   PT SHORT TERM GOAL #2   Title improve bil knee AROM 0-90 for improved function and mobility (05/23/15)  Time 4   Period Weeks   Status Achieved   PT SHORT TERM GOAL #3   Title ambulate > 150' with single point cane for improved function and mobility    Time 4   Period Weeks   Status Achieved   PT SHORT TERM GOAL #4   Title report ability to stand > 20 min for improved strength and function    Time 4   Period Weeks   Status Achieved           PT Long Term Goals - 06/25/15 1849    PT LONG TERM GOAL #1   Title bil knee AROM 0-105 for improved functional mobility (06/20/15)   Baseline RT 90,  LT 100   Time 8   Period Weeks   Status On-going   PT LONG TERM GOAL #2   Title negotiate at least 3 stairs reciprocally without AD or rail for improved strength and function   Baseline able to do 12 steps reciprocally with rails.  6".  can do without rails 2" in clinic with SBA   Time 8   Period Weeks   Status On-going   PT LONG TERM GOAL #3   Title report ability to walk > 30 min without increase in pain for improved function    Baseline 20 min   Time 8    Period Weeks   Status On-going   PT LONG TERM GOAL #4   Title demonstrate 10 reps SLR bil without extensor lag for improved strength   Baseline lag   Time 8   Period Weeks   Status On-going               Plan - 06/27/15 1157    Clinical Impression Statement Makinze reports that she has bene working on her HEP and being consistent with her exercises. She reported seeing the physician the other day and states he is happy with he progress and to continue with therapy. Worked on gait training/ stair training and strengthening today which she did well with. utilized ice following todays session to decrease soreness.    PT Next Visit Plan try walking with pulleys, tape for edema.  More stretching. strength and balance, gait training with heel strike and toe off.    PT Home Exercise Plan gait training    Consulted and Agree with Plan of Care Patient        Problem List Patient Active Problem List   Diagnosis Date Noted  . Other bilateral secondary osteoarthritis of knee 04/03/2015  . Abnormality of gait 04/26/2014  . 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 Family history of diabetes mellitus (DM) 01/23/2014  . Right knee DJD 01/23/2014  . Hyperlipidemia 01/23/2014  . HYPERLIPIDEMIA 08/14/2007  . DEPRESSION 08/14/2007  . GERD 08/14/2007  . OSTEOARTHRITIS 08/14/2007  . HEADACHE, CHRONIC 08/14/2007   [name] PT, DPT, LAT, ATC  06/27/2015  12:06 PM     [facility] [address] [place], [place], [address] Phone: [phone]   Fax:  [phone]  Name: Reece Charles MRN: [id number] Date of Birth: [birth year]

## 2015-06-30 ENCOUNTER — Ambulatory Visit: Payer: Self-pay | Admitting: Physical Therapy

## 2015-06-30 DIAGNOSIS — R600 Localized edema: Secondary | ICD-10-CM

## 2015-06-30 DIAGNOSIS — R269 Unspecified abnormalities of gait and mobility: Secondary | ICD-10-CM

## 2015-06-30 DIAGNOSIS — M25661 Stiffness of right knee, not elsewhere classified: Secondary | ICD-10-CM

## 2015-06-30 DIAGNOSIS — M25662 Stiffness of left knee, not elsewhere classified: Secondary | ICD-10-CM

## 2015-06-30 DIAGNOSIS — M25561 Pain in right knee: Secondary | ICD-10-CM

## 2015-06-30 DIAGNOSIS — M25562 Pain in left knee: Principal | ICD-10-CM

## 2015-06-30 DIAGNOSIS — M25669 Stiffness of unspecified knee, not elsewhere classified: Secondary | ICD-10-CM

## 2015-06-30 NOTE — Therapy (Signed)
[place], [place], [address] Phone: [phone]   Fax:  [phone]  Physical Therapy Treatment  Patient Details  Name: Tina Johnson MRN: [id number] Date of Birth: 1966-03-18 Referring Provider: Dr Meridee Score / Dr. Erlinda Hong  Encounter Date: 06/30/2015      PT End of Session - 06/30/15 1211    Visit Number 28   Number of Visits 35   Date for PT Re-Evaluation 07/25/15   PT Start Time 1018   PT Stop Time 1110   PT Time Calculation (min) 52 min   Activity Tolerance Patient tolerated treatment well   Behavior During Therapy [facility] for tasks assessed/performed      Past Medical History  Diagnosis Date  . Anxiety     controls with exercise   . Indigestion     no medicine at present  . Arthritis     knees , shoulders    Past Surgical History  Procedure Laterality Date  . Abdominal hysterectomy    . Total abdominal hysterectomy w/ bilateral salpingoophorectomy    . Vaginal delivery      ?x2  . Total knee arthroplasty Right 04/03/2015    Procedure: RIGHT TOTAL KNEE ARTHROPLASTY;  Surgeon: Leandrew Koyanagi, MD;  Location: [place];  Service: Orthopedics;  Laterality: Right;  . Total knee arthroplasty Left 04/03/2015    Procedure: LEFT TOTAL KNEE ARTHROPLASTY;  Surgeon: Leandrew Koyanagi, MD;  Location: [place];  Service: Orthopedics;  Laterality: Left;    There were no vitals filed for this visit.  Visit Diagnosis:  Arthralgia of both knees  Decreased range of motion (ROM) of knee  Knee stiffness, left  Localized edema  Abnormality of gait  Knee stiffness, right      Subjective Assessment - 06/30/15 1035    Subjective NO pain.  Able to walk 40 minutes with 5/10 pain.  I slept better that day.   Patient is accompained by: Interpreter   Currently in Pain? No/denies                         [facility] Adult PT Treatment/Exercise - 06/30/15 0001    Ambulation/Gait   Ambulation/Gait Yes    Ambulation/Gait Assistance Other (comment)  cues.     Gait Comments Level and stairs.  Anle to use hands less.    16 steps,  with rails step over step.  Deviations and extra time.     Knee/Hip Exercises: Aerobic   Nustep L4 6 minutes   Knee/Hip Exercises: Machines for Strengthening   Total Gym Leg Press 15 LBS 2 sets 10 X 15 LBS,  both 35 LBS 20 X    RT feels stronger   Knee/Hip Exercises: Standing   Knee Flexion 10 reps;1 set  3 LBS each   Hip Flexion 1 set;10 reps   Walking with Sports Cord cable walk 2 plates in reverse 10 X SBA   Cryotherapy   Cryotherapy Location Knee  both   Type of Cryotherapy --  cold pack, legs elevated.                  PT Short Term Goals - 06/20/15 1054    PT SHORT TERM GOAL #1   Title independent with HEP (05/23/15)   Time 4   Period Weeks   Status Achieved   PT SHORT TERM GOAL #2   Title improve bil knee AROM 0-90 for improved function and mobility (05/23/15)  Time 4   Period Weeks   Status Achieved   PT SHORT TERM GOAL #3   Title ambulate > 150' with single point cane for improved function and mobility    Time 4   Period Weeks   Status Achieved   PT SHORT TERM GOAL #4   Title report ability to stand > 20 min for improved strength and function    Time 4   Period Weeks   Status Achieved           PT Long Term Goals - 06/30/15 1215    PT LONG TERM GOAL #1   Title bil knee AROM 0-105 for improved functional mobility (06/20/15)   Time 8   Period Weeks   Status Unable to assess   PT LONG TERM GOAL #2   Title negotiate at least 3 stairs reciprocally without AD or rail for improved strength and function   Baseline needs rails for 6 inch steps.  Is pulling on her hands less.   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  Gait is improving in clinic with fewer cues today.  She can now walk 40 minutes with 5/10 pain.  She continues to use the cane as previous.   PT Next Visit Plan Try tape, walking with pulleys forward and stretching  Supine SLR check for lag.  Check specific goals. .    PT Home Exercise Plan continue stretches especially since we did not do this much today   Consulted and Agree with Plan of Care Patient        Problem List Patient Active Problem List   Diagnosis Date Noted  . Other bilateral secondary osteoarthritis of knee 04/03/2015  . Abnormality of gait 04/26/2014  . 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 Family history of diabetes mellitus (DM) 01/23/2014  . Right knee DJD 01/23/2014  . Hyperlipidemia 01/23/2014  . HYPERLIPIDEMIA 08/14/2007  . DEPRESSION 08/14/2007  . GERD 08/14/2007  . OSTEOARTHRITIS 08/14/2007  . HEADACHE, CHRONIC 08/14/2007    [facility] 06/30/2015, 12:18 PM  [facility] [address] [place], [place], [address] Phone: [phone]   Fax:  [phone]  Name: Tina Johnson MRN: [id number] Date of Birth: 11-12-1965    Melvenia Needles, PTA 06/30/2015 12:18 PM Phone: [phone] Fax: [phone]

## 2015-07-02 ENCOUNTER — Encounter: Payer: No Typology Code available for payment source | Admitting: Physical Therapy

## 2015-07-02 ENCOUNTER — Ambulatory Visit: Payer: Self-pay | Admitting: Physical Therapy

## 2015-07-02 DIAGNOSIS — M25562 Pain in left knee: Principal | ICD-10-CM

## 2015-07-02 DIAGNOSIS — R269 Unspecified abnormalities of gait and mobility: Secondary | ICD-10-CM

## 2015-07-02 DIAGNOSIS — M25661 Stiffness of right knee, not elsewhere classified: Secondary | ICD-10-CM

## 2015-07-02 DIAGNOSIS — R600 Localized edema: Secondary | ICD-10-CM

## 2015-07-02 DIAGNOSIS — M25561 Pain in right knee: Secondary | ICD-10-CM

## 2015-07-02 DIAGNOSIS — M25662 Stiffness of left knee, not elsewhere classified: Secondary | ICD-10-CM

## 2015-07-02 DIAGNOSIS — M25669 Stiffness of unspecified knee, not elsewhere classified: Secondary | ICD-10-CM

## 2015-07-02 NOTE — Therapy (Signed)
[place], [place], [address] Phone: [phone]   Fax:  [phone]  Physical Therapy Treatment  Patient Details  Name: Tina Johnson MRN: [id number] Date of Birth: 05/26/1965 Referring Provider: Dr Meridee Score / Dr. Erlinda Hong  Encounter Date: 07/02/2015      PT End of Session - 07/02/15 1513    Visit Number 29   Number of Visits 35   Date for PT Re-Evaluation 07/25/15   Authorization Type Self-Pay   PT Start Time 1415   PT Stop Time 1510   PT Time Calculation (min) 55 min   Activity Tolerance Patient tolerated treatment well   Behavior During Therapy [facility] for tasks assessed/performed      Past Medical History  Diagnosis Date  . Anxiety     controls with exercise   . Indigestion     no medicine at present  . Arthritis     knees , shoulders    Past Surgical History  Procedure Laterality Date  . Abdominal hysterectomy    . Total abdominal hysterectomy w/ bilateral salpingoophorectomy    . Vaginal delivery      ?x2  . Total knee arthroplasty Right 04/03/2015    Procedure: RIGHT TOTAL KNEE ARTHROPLASTY;  Surgeon: Leandrew Koyanagi, MD;  Location: [place];  Service: Orthopedics;  Laterality: Right;  . Total knee arthroplasty Left 04/03/2015    Procedure: LEFT TOTAL KNEE ARTHROPLASTY;  Surgeon: Leandrew Koyanagi, MD;  Location: [place];  Service: Orthopedics;  Laterality: Left;    There were no vitals filed for this visit.  Visit Diagnosis:  Arthralgia of both knees  Decreased range of motion (ROM) of knee  Knee stiffness, left  Localized edema  Abnormality of gait  Knee stiffness, right      Subjective Assessment - 07/02/15 1415    Subjective "I've been bending my knee more in the morning"    Currently in Pain? Yes   Pain Score 1    Pain Orientation Right;Left   Pain Onset More than a month ago   Pain Frequency Intermittent                         [facility] Adult PT  Treatment/Exercise - 07/02/15 1422    Knee/Hip Exercises: Aerobic   Nustep L 6 x 10 min  moving seat closer at 5 min, using pillows to assist    Knee/Hip Exercises: Standing   Step Down --   Walking with Sports Cord cable walk 2 plates in reverse x 5 walking forward> backward, and backward > forward.    Modalities   Modalities Moist Heat   Moist Heat Therapy   Number Minutes Moist Heat 10 Minutes   Moist Heat Location Knee  trial of heat   Manual Therapy   Manual Therapy Taping   Kinesiotex Edema;Create Space   Kinesiotix   Edema bil anterior knees                PT Education - 07/02/15 1513    Education provided Yes   Education Details KT taping for edema reduction of the knee    Person(s) Educated Patient   Methods Explanation   Comprehension Verbalized understanding          PT Short Term Goals - 06/20/15 1054    PT SHORT TERM GOAL #1   Title independent with HEP (05/23/15)   Time 4   Period Weeks   Status Achieved  PT SHORT TERM GOAL #2   Title improve bil knee AROM 0-90 for improved function and mobility (05/23/15)   Time 4   Period Weeks   Status Achieved   PT SHORT TERM GOAL #3   Title ambulate > 150' with single point cane for improved function and mobility    Time 4   Period Weeks   Status Achieved   PT SHORT TERM GOAL #4   Title report ability to stand > 20 min for improved strength and function    Time 4   Period Weeks   Status Achieved           PT Long Term Goals - 06/30/15 1215    PT LONG TERM GOAL #1   Title bil knee AROM 0-105 for improved functional mobility (06/20/15)   Time 8   Period Weeks   Status Unable to assess   PT LONG TERM GOAL #2   Title negotiate at least 3 stairs reciprocally without AD or rail for improved strength and function   Baseline needs rails for 6 inch steps.  Is pulling on her hands less.   Time 8   Period Weeks   Status On-going   PT LONG TERM GOAL #3   Title report ability to walk > 30 min without  increase in pain for improved function    Baseline 40 minutes 5/10 pain   Time 8   Period Weeks   Status On-going   PT LONG TERM GOAL #4   Title demonstrate 10 reps SLR bil without extensor lag for improved strength   Time 8   Period Weeks   Status Unable to assess               Plan - 07/02/15 1513    Clinical Impression Statement Eather continues to make progress with therapy. Attempted trial of Kt tape to decrease swelling in the knee, and attempted trial of heat post session. She was able to do Nu-step exercsie and resisted cable walking without report of increased pain. Plan to conitnue with exercises as tolerated.    PT Next Visit Plan assess response to KT tape, resisted walking, begin step downs, CKC exercises.    Consulted and Agree with Plan of Care Patient        Problem List Patient Active Problem List   Diagnosis Date Noted  . Other bilateral secondary osteoarthritis of knee 04/03/2015  . Abnormality of gait 04/26/2014  . 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 Family history of diabetes mellitus (DM) 01/23/2014  . Right knee DJD 01/23/2014  . Hyperlipidemia 01/23/2014  . HYPERLIPIDEMIA 08/14/2007  . DEPRESSION 08/14/2007  . GERD 08/14/2007  . OSTEOARTHRITIS 08/14/2007  . HEADACHE, CHRONIC 08/14/2007   [name] PT, DPT, LAT, ATC  07/02/2015  3:16 PM     [facility] [address] [place], [place], [address] Phone: [phone]   Fax:  [phone]  Name: Tina Johnson MRN: [id number] Date of Birth: Apr 14, 1966

## 2015-07-04 ENCOUNTER — Ambulatory Visit: Payer: Self-pay | Admitting: Physical Therapy

## 2015-07-04 DIAGNOSIS — M25669 Stiffness of unspecified knee, not elsewhere classified: Secondary | ICD-10-CM

## 2015-07-04 DIAGNOSIS — R269 Unspecified abnormalities of gait and mobility: Secondary | ICD-10-CM

## 2015-07-04 DIAGNOSIS — M25661 Stiffness of right knee, not elsewhere classified: Secondary | ICD-10-CM

## 2015-07-04 DIAGNOSIS — M25662 Stiffness of left knee, not elsewhere classified: Secondary | ICD-10-CM

## 2015-07-04 DIAGNOSIS — R600 Localized edema: Secondary | ICD-10-CM

## 2015-07-04 DIAGNOSIS — M25562 Pain in left knee: Principal | ICD-10-CM

## 2015-07-04 DIAGNOSIS — M25561 Pain in right knee: Secondary | ICD-10-CM

## 2015-07-04 NOTE — Therapy (Signed)
[place], [place], [address] Phone: [phone]   Fax:  [phone]  Physical Therapy Treatment  Patient Details  Name: Tina Johnson MRN: [id number] Date of Birth: 1965/09/10 Referring Provider: Dr Meridee Score / Dr. Erlinda Hong  Encounter Date: 07/04/2015      PT End of Session - 07/04/15 1202    Visit Number 30   Number of Visits 35   Date for PT Re-Evaluation 07/25/15   PT Start Time [id number]   PT Stop Time 1103   PT Time Calculation (min) 48 min   Activity Tolerance Patient tolerated treatment well   Behavior During Therapy [facility] for tasks assessed/performed      Past Medical History  Diagnosis Date  . Anxiety     controls with exercise   . Indigestion     no medicine at present  . Arthritis     knees , shoulders    Past Surgical History  Procedure Laterality Date  . Abdominal hysterectomy    . Total abdominal hysterectomy w/ bilateral salpingoophorectomy    . Vaginal delivery      ?x2  . Total knee arthroplasty Right 04/03/2015    Procedure: RIGHT TOTAL KNEE ARTHROPLASTY;  Surgeon: Leandrew Koyanagi, MD;  Location: [place];  Service: Orthopedics;  Laterality: Right;  . Total knee arthroplasty Left 04/03/2015    Procedure: LEFT TOTAL KNEE ARTHROPLASTY;  Surgeon: Leandrew Koyanagi, MD;  Location: [place];  Service: Orthopedics;  Laterality: Left;    There were no vitals filed for this visit.  Visit Diagnosis:  Arthralgia of both knees  Decreased range of motion (ROM) of knee  Knee stiffness, left  Localized edema  Abnormality of gait  Knee stiffness, right      Subjective Assessment - 07/04/15 1022    Subjective " the cold makes my knees stiff, the tape helped by the tape came off"    Currently in Pain? No/denies                         [facility] Adult PT Treatment/Exercise - 07/04/15 1103    Knee/Hip Exercises: Aerobic   Nustep L6 x 10 min  LE only, with seat at L1   Stepper x 5 min  machine set initally but turned off due to slow speed   Knee/Hip Exercises: Standing   Step Down 2 sets;Both;Step Height: 4"   Walking with Sports Cord cable walk 2 plates in reverse x 5 walking forward> backward, and backward > forward.    Knee/Hip Exercises: Seated   Long Arc Quad AROM;Strengthening;Both;2 sets;10 reps  2.5 #   Stool Scoot - Round Trips 4 x 15 ft   Moist Heat Therapy   Number Minutes Moist Heat 10 Minutes   Moist Heat Location Knee                  PT Short Term Goals - 06/20/15 1054    PT SHORT TERM GOAL #1   Title independent with HEP (05/23/15)   Time 4   Period Weeks   Status Achieved   PT SHORT TERM GOAL #2   Title improve bil knee AROM 0-90 for improved function and mobility (05/23/15)   Time 4   Period Weeks   Status Achieved   PT SHORT TERM GOAL #3   Title ambulate > 150' with single point cane for improved function and mobility    Time 4   Period Weeks  Status Achieved   PT SHORT TERM GOAL #4   Title report ability to stand > 20 min for improved strength and function    Time 4   Period Weeks   Status Achieved           PT Long Term Goals - 06/30/15 1215    PT LONG TERM GOAL #1   Title bil knee AROM 0-105 for improved functional mobility (06/20/15)   Time 8   Period Weeks   Status Unable to assess   PT LONG TERM GOAL #2   Title negotiate at least 3 stairs reciprocally without AD or rail for improved strength and function   Baseline needs rails for 6 inch steps.  Is pulling on her hands less.   Time 8   Period Weeks   Status On-going   PT LONG TERM GOAL #3   Title report ability to walk > 30 min without increase in pain for improved function    Baseline 40 minutes 5/10 pain   Time 8   Period Weeks   Status On-going   PT LONG TERM GOAL #4   Title demonstrate 10 reps SLR bil without extensor lag for improved strength   Time 8   Period Weeks   Status Unable to assess               Plan - 07/04/15  1203    Clinical Impression Statement Tina Johnson reported no pain just feeling stiff today. Focused today on strengthening exercises which she did well with. She required cues to stand up straight during the stair stepper, and step down exercises. and required rest breaks with stool scoots but was able to complete the exercise. following she reported some soreness which was controlled with MHP.    PT Next Visit Plan Resisted walking, step downs, Stepper vs Nu step with increased resistance? CKC exercises, add step downs to HEP    Consulted and Agree with Plan of Care Patient        Problem List Patient Active Problem List   Diagnosis Date Noted  . Other bilateral secondary osteoarthritis of knee 04/03/2015  . Abnormality of gait 04/26/2014  . 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 Family history of diabetes mellitus (DM) 01/23/2014  . Right knee DJD 01/23/2014  . Hyperlipidemia 01/23/2014  . HYPERLIPIDEMIA 08/14/2007  . DEPRESSION 08/14/2007  . GERD 08/14/2007  . OSTEOARTHRITIS 08/14/2007  . HEADACHE, CHRONIC 08/14/2007   [name] PT, DPT, LAT, ATC  07/04/2015  12:07 PM      [facility] [address] [place], [place], [address] Phone: [phone]   Fax:  [phone]  Name: Tina Johnson MRN: [id number] Date of Birth: 02-24-1966

## 2015-07-07 ENCOUNTER — Ambulatory Visit: Payer: Self-pay | Admitting: Physical Therapy

## 2015-07-07 DIAGNOSIS — M25669 Stiffness of unspecified knee, not elsewhere classified: Secondary | ICD-10-CM

## 2015-07-07 DIAGNOSIS — M25661 Stiffness of right knee, not elsewhere classified: Secondary | ICD-10-CM

## 2015-07-07 DIAGNOSIS — M25562 Pain in left knee: Principal | ICD-10-CM

## 2015-07-07 DIAGNOSIS — M25662 Stiffness of left knee, not elsewhere classified: Secondary | ICD-10-CM

## 2015-07-07 DIAGNOSIS — R600 Localized edema: Secondary | ICD-10-CM

## 2015-07-07 DIAGNOSIS — M25561 Pain in right knee: Secondary | ICD-10-CM

## 2015-07-07 NOTE — Therapy (Signed)
[place], [place], [address] Phone: [phone]   Fax:  [phone]  Physical Therapy Treatment  Patient Details  Name: Tina Johnson MRN: [id number] Date of Birth: 11-28-1965 Referring Provider: Dr Meridee Score / Dr. Erlinda Hong  Encounter Date: 07/07/2015      PT End of Session - 07/07/15 0916    Visit Number 31   Number of Visits 35   Date for PT Re-Evaluation 07/25/15   PT Start Time 0813   PT Stop Time 0920   PT Time Calculation (min) 67 min   Activity Tolerance Patient tolerated treatment well   Behavior During Therapy [facility] for tasks assessed/performed      Past Medical History  Diagnosis Date  . Anxiety     controls with exercise   . Indigestion     no medicine at present  . Arthritis     knees , shoulders    Past Surgical History  Procedure Laterality Date  . Abdominal hysterectomy    . Total abdominal hysterectomy w/ bilateral salpingoophorectomy    . Vaginal delivery      ?x2  . Total knee arthroplasty Right 04/03/2015    Procedure: RIGHT TOTAL KNEE ARTHROPLASTY;  Surgeon: Leandrew Koyanagi, MD;  Location: [place];  Service: Orthopedics;  Laterality: Right;  . Total knee arthroplasty Left 04/03/2015    Procedure: LEFT TOTAL KNEE ARTHROPLASTY;  Surgeon: Leandrew Koyanagi, MD;  Location: [place];  Service: Orthopedics;  Laterality: Left;    There were no vitals filed for this visit.  Visit Diagnosis:  Arthralgia of both knees  Decreased range of motion (ROM) of knee  Knee stiffness, left  Localized edema  Knee stiffness, right      Subjective Assessment - 07/07/15 0816    Subjective I am walking more at home.  I want to work on stairs and the pulling weight.  I don't have those things at home.   Currently in Pain? Yes   Pain Score --  mild, no number given.   Pain Location Knee   Pain Orientation Right;Left   Pain Descriptors / Indicators --  stiff, swollen   Pain Frequency  Intermittent   Aggravating Factors  bending.   Pain Relieving Factors medication, exercise.                         [facility] Adult PT Treatment/Exercise - 07/07/15 0820    Ambulation/Gait   Gait Comments Stairs-  4 steps X4  cues for eccentric lowering, less use of hands.     Knee/Hip Exercises: Aerobic   Nustep L6  7 minutes   Stepper 5 minutes L1, 17 floors   Knee/Hip Exercises: Standing   Walking with Sports Cord Cable- [place]- 5 X 4 way 2 plates.  Min assist   Cryotherapy   Number Minutes Cryotherapy 10 Minutes   Cryotherapy Location Knee  both   Type of Cryotherapy --  cold pack, leg elevated.   Manual Therapy   Manual Therapy --  patella glides,  stiff initially RT,  LT mobile   Joint Mobilization Anterior glide,  IR fith flexion stretch with movement supine.  End feel RT stiffer than LT.  RT barely 90 degrees this morning   Passive ROM knee flexion                  PT Short Term Goals - 06/20/15 1054    PT SHORT TERM GOAL #1  Title independent with HEP (05/23/15)   Time 4   Period Weeks   Status Achieved   PT SHORT TERM GOAL #2   Title improve bil knee AROM 0-90 for improved function and mobility (05/23/15)   Time 4   Period Weeks   Status Achieved   PT SHORT TERM GOAL #3   Title ambulate > 150' with single point cane for improved function and mobility    Time 4   Period Weeks   Status Achieved   PT SHORT TERM GOAL #4   Title report ability to stand > 20 min for improved strength and function    Time 4   Period Weeks   Status Achieved           PT Long Term Goals - 07/07/15 [id number]    PT LONG TERM GOAL #1   Title bil knee AROM 0-105 for improved functional mobility (06/20/15)   Time 8   Period Weeks   Status On-going   PT LONG TERM GOAL #2   Title negotiate at least 3 stairs reciprocally without AD or rail for improved strength and function   Baseline needs rails for 6 inch steps.  Is pulling on her hands less.   Time 8    Period Weeks   Status On-going   PT LONG TERM GOAL #3   Title report ability to walk > 30 min without increase in pain for improved function    Time 8   Period Weeks   Status Unable to assess   PT LONG TERM GOAL #4   Title demonstrate 10 reps SLR bil without extensor lag for improved strength   Time 8   Period Weeks   Status Unable to assess               Plan - 07/07/15 0917    Clinical Impression Statement Mild pain post stretching.  She is able to use cane less in her home.  ROM continues to be limited in flexion.   PT Next Visit Plan Resisted walking, step downs, Stepper vs Nu step with increased resistance? CKC exercises, add step downs to HEP .  Check Quad lag with SLR for LTG.  Measure   PT Home Exercise Plan continue   Consulted and Agree with Plan of Care Patient        Problem List Patient Active Problem List   Diagnosis Date Noted  . Other bilateral secondary osteoarthritis of knee 04/03/2015  . Abnormality of gait 04/26/2014  . 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 Family history of diabetes mellitus (DM) 01/23/2014  . Right knee DJD 01/23/2014  . Hyperlipidemia 01/23/2014  . HYPERLIPIDEMIA 08/14/2007  . DEPRESSION 08/14/2007  . GERD 08/14/2007  . OSTEOARTHRITIS 08/14/2007  . HEADACHE, CHRONIC 08/14/2007    HARRIS,KAREN 07/07/2015, 9:22 AM  [place], [place], [address] Phone: [phone]   Fax:  [phone]  Name: Vondra Pritt MRN: [id number] Date of Birth: 11/12/1965    Melvenia Needles, PTA 07/07/2015 9:22 AM Phone: [phone] Fax: [phone]

## 2015-07-09 ENCOUNTER — Encounter: Payer: No Typology Code available for payment source | Admitting: Physical Therapy

## 2015-07-09 ENCOUNTER — Ambulatory Visit: Payer: Self-pay | Admitting: Physical Therapy

## 2015-07-09 DIAGNOSIS — M25662 Stiffness of left knee, not elsewhere classified: Secondary | ICD-10-CM

## 2015-07-09 DIAGNOSIS — M25561 Pain in right knee: Secondary | ICD-10-CM

## 2015-07-09 DIAGNOSIS — M25562 Pain in left knee: Principal | ICD-10-CM

## 2015-07-09 DIAGNOSIS — M25661 Stiffness of right knee, not elsewhere classified: Secondary | ICD-10-CM

## 2015-07-09 DIAGNOSIS — M25669 Stiffness of unspecified knee, not elsewhere classified: Secondary | ICD-10-CM

## 2015-07-09 DIAGNOSIS — R600 Localized edema: Secondary | ICD-10-CM

## 2015-07-09 DIAGNOSIS — R269 Unspecified abnormalities of gait and mobility: Secondary | ICD-10-CM

## 2015-07-09 NOTE — Therapy (Signed)
[place], [place], [address] Phone: [phone]   Fax:  [phone]  Physical Therapy Treatment  Patient Details  Name: Tina Johnson MRN: [id number] Date of Birth: 10/06/1965 Referring Provider: Dr Meridee Score / Dr. Erlinda Hong  Encounter Date: 07/09/2015      PT End of Session - 07/09/15 1243    Visit Number 32   Number of Visits 35   Date for PT Re-Evaluation 07/25/15   PT Start Time [id number]   PT Stop Time 1248   PT Time Calculation (min) 63 min   Activity Tolerance Patient tolerated treatment well   Behavior During Therapy [facility] for tasks assessed/performed      Past Medical History  Diagnosis Date  . Anxiety     controls with exercise   . Indigestion     no medicine at present  . Arthritis     knees , shoulders    Past Surgical History  Procedure Laterality Date  . Abdominal hysterectomy    . Total abdominal hysterectomy w/ bilateral salpingoophorectomy    . Vaginal delivery      ?x2  . Total knee arthroplasty Right 04/03/2015    Procedure: RIGHT TOTAL KNEE ARTHROPLASTY;  Surgeon: Leandrew Koyanagi, MD;  Location: [place];  Service: Orthopedics;  Laterality: Right;  . Total knee arthroplasty Left 04/03/2015    Procedure: LEFT TOTAL KNEE ARTHROPLASTY;  Surgeon: Leandrew Koyanagi, MD;  Location: [place];  Service: Orthopedics;  Laterality: Left;    There were no vitals filed for this visit.  Visit Diagnosis:  Arthralgia of both knees  Decreased range of motion (ROM) of knee  Knee stiffness, left  Localized edema  Abnormality of gait  Knee stiffness, right      Subjective Assessment - 07/09/15 1150    Subjective "I am doing better, still have stiffness with some pain rated at 2/10"    Currently in Pain? Yes   Pain Score 2    Pain Orientation Right;Left   Pain Type Chronic pain   Pain Onset More than a month ago   Pain Frequency Intermittent            [facility] PT Assessment - 07/09/15  1207    AROM   Right Knee Extension 94   Left Knee Flexion 98                     [facility] Adult PT Treatment/Exercise - 07/09/15 1158    Self-Care   Other Self-Care Comments  how to perform self manual trigger point release on quads to relief tension to promote knee flexion   Knee/Hip Exercises: Stretches   Sports administrator Both;2 reps;30 seconds   Knee/Hip Exercises: Aerobic   Nustep L6 x 7 min with seat at L1  LE only   Stepper 5 minutes L2, 18 floors  changed to L1 automatically   Moist Heat Therapy   Number Minutes Moist Heat 10 Minutes   Moist Heat Location Knee  bil   Manual Therapy   Manual therapy comments bil manual trigger point release in rectus femoris bil x 4  given as HEP   Joint Mobilization posterior glides grade 3 to improve bending bil   Passive ROM knee flexion with over pressure                PT Education - 07/09/15 1241    Education provided Yes   Education Details updated HEP, manual trigger point release  Person(s) Educated Patient   Methods Explanation   Comprehension Verbalized understanding          PT Short Term Goals - 06/20/15 1054    PT SHORT TERM GOAL #1   Title independent with HEP (05/23/15)   Time 4   Period Weeks   Status Achieved   PT SHORT TERM GOAL #2   Title improve bil knee AROM 0-90 for improved function and mobility (05/23/15)   Time 4   Period Weeks   Status Achieved   PT SHORT TERM GOAL #3   Title ambulate > 150' with single point cane for improved function and mobility    Time 4   Period Weeks   Status Achieved   PT SHORT TERM GOAL #4   Title report ability to stand > 20 min for improved strength and function    Time 4   Period Weeks   Status Achieved           PT Long Term Goals - 07/07/15 [id number]    PT LONG TERM GOAL #1   Title bil knee AROM 0-105 for improved functional mobility (06/20/15)   Time 8   Period Weeks   Status On-going   PT LONG TERM GOAL #2   Title negotiate at least 3 stairs  reciprocally without AD or rail for improved strength and function   Baseline needs rails for 6 inch steps.  Is pulling on her hands less.   Time 8   Period Weeks   Status On-going   PT LONG TERM GOAL #3   Title report ability to walk > 30 min without increase in pain for improved function    Time 8   Period Weeks   Status Unable to assess   PT LONG TERM GOAL #4   Title demonstrate 10 reps SLR bil without extensor lag for improved strength   Time 8   Period Weeks   Status Unable to assess               Plan - 07/09/15 1243    Clinical Impression Statement cynai continues to demontsrate limited knee flexion with 100 degrees of L knee flexion and 94 degrees. worked on Forensic scientist point release techniques to release tension of the quads to help promote knee bending, which multiple twitches were noted bil. perofrmed manual to increase flexion of bil knees, and educated how to perform at home. performed wall sits, and step downs which she required cues to stand up straight. utilized [facility] following todays session to decrease pain and soreness post session.    PT Next Visit Plan Resisted walking, step downs, Stepper vs Nu step with increased resistance? CKC exercises,    PT Home Exercise Plan wall sits, step downs,manual trigger point release with tennis ball.    Consulted and Agree with Plan of Care Patient        Problem List Patient Active Problem List   Diagnosis Date Noted  . Other bilateral secondary osteoarthritis of knee 04/03/2015  . Abnormality of gait 04/26/2014  . 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 Family history of diabetes mellitus (DM) 01/23/2014  . Right knee DJD 01/23/2014  . Hyperlipidemia 01/23/2014  . HYPERLIPIDEMIA 08/14/2007  . DEPRESSION 08/14/2007  . GERD 08/14/2007  . OSTEOARTHRITIS 08/14/2007  . HEADACHE, CHRONIC 08/14/2007   [name] PT, DPT, LAT, ATC  07/09/2015  1:00  PM      [facility]  [place], [place], [address] Phone: [phone]   Fax:  [phone]  Name: Tina Johnson MRN: [id number] Date of Birth: 1965/05/03

## 2015-07-11 ENCOUNTER — Ambulatory Visit: Payer: Self-pay | Admitting: Physical Therapy

## 2015-07-11 DIAGNOSIS — M25662 Stiffness of left knee, not elsewhere classified: Secondary | ICD-10-CM

## 2015-07-11 DIAGNOSIS — R269 Unspecified abnormalities of gait and mobility: Secondary | ICD-10-CM

## 2015-07-11 DIAGNOSIS — M25669 Stiffness of unspecified knee, not elsewhere classified: Secondary | ICD-10-CM

## 2015-07-11 DIAGNOSIS — M25562 Pain in left knee: Principal | ICD-10-CM

## 2015-07-11 DIAGNOSIS — M25561 Pain in right knee: Secondary | ICD-10-CM

## 2015-07-11 DIAGNOSIS — M25661 Stiffness of right knee, not elsewhere classified: Secondary | ICD-10-CM

## 2015-07-11 DIAGNOSIS — R600 Localized edema: Secondary | ICD-10-CM

## 2015-07-11 NOTE — Therapy (Signed)
[place], [place], [address] Phone: [phone]   Fax:  [phone]  Physical Therapy Treatment  Patient Details  Name: Tina Johnson MRN: [id number] Date of Birth: 1965-05-25 Referring Provider: Dr Meridee Score / Dr. Erlinda Hong  Encounter Date: 07/11/2015      PT End of Session - 07/11/15 1149    Visit Number 33   Number of Visits 35   Date for PT Re-Evaluation 07/25/15   PT Start Time [id number]   PT Stop Time 1105   PT Time Calculation (min) 50 min   Activity Tolerance Patient tolerated treatment well   Behavior During Therapy [facility] for tasks assessed/performed      Past Medical History  Diagnosis Date  . Anxiety     controls with exercise   . Indigestion     no medicine at present  . Arthritis     knees , shoulders    Past Surgical History  Procedure Laterality Date  . Abdominal hysterectomy    . Total abdominal hysterectomy w/ bilateral salpingoophorectomy    . Vaginal delivery      ?x2  . Total knee arthroplasty Right 04/03/2015    Procedure: RIGHT TOTAL KNEE ARTHROPLASTY;  Surgeon: Leandrew Koyanagi, MD;  Location: [place];  Service: Orthopedics;  Laterality: Right;  . Total knee arthroplasty Left 04/03/2015    Procedure: LEFT TOTAL KNEE ARTHROPLASTY;  Surgeon: Leandrew Koyanagi, MD;  Location: [place];  Service: Orthopedics;  Laterality: Left;    There were no vitals filed for this visit.  Visit Diagnosis:  Arthralgia of both knees  Decreased range of motion (ROM) of knee  Knee stiffness, left  Localized edema  Abnormality of gait  Knee stiffness, right      Subjective Assessment - 07/11/15 1017    Subjective "I am feeling sore in the knees from the last session in the quads"   Currently in Pain? Yes   Pain Score 2    Pain Location Knee   Pain Orientation Right;Left   Pain Descriptors / Indicators Aching   Pain Type Chronic pain   Pain Onset More than a month ago   Pain Frequency  Intermittent   Aggravating Factors  bending,             [facility] PT Assessment - 07/11/15 0001    AROM   Right Knee Flexion 94                     [facility] Adult PT Treatment/Exercise - 07/11/15 0001    Knee/Hip Exercises: Stretches   Hip Flexor Stretch 30 seconds;Both;4 reps  contract/ relax with 10 sec hold   Knee/Hip Exercises: Aerobic   Stepper L 1 x 5 min, L2 x 5 min total of    Knee/Hip Exercises: Standing   Step Down 2 sets;Both;Step Height: 4"   Walking with Sports Cord Cable- [place]- 3 X 4 way 3 plates.  min assist for safety, cues for proper form during sidesteps   Moist Heat Therapy   Number Minutes Moist Heat 10 Minutes   Moist Heat Location Knee  bil in supine   Manual Therapy   Manual therapy comments bil manual trigger point release in rectus femoris bil x 4   Passive ROM knee flexion with over pressure                  PT Short Term Goals - 06/20/15 1054    PT SHORT TERM  GOAL #1   Title independent with HEP (05/23/15)   Time 4   Period Weeks   Status Achieved   PT SHORT TERM GOAL #2   Title improve bil knee AROM 0-90 for improved function and mobility (05/23/15)   Time 4   Period Weeks   Status Achieved   PT SHORT TERM GOAL #3   Title ambulate > 150' with single point cane for improved function and mobility    Time 4   Period Weeks   Status Achieved   PT SHORT TERM GOAL #4   Title report ability to stand > 20 min for improved strength and function    Time 4   Period Weeks   Status Achieved           PT Long Term Goals - 07/07/15 [id number]    PT LONG TERM GOAL #1   Title bil knee AROM 0-105 for improved functional mobility (06/20/15)   Time 8   Period Weeks   Status On-going   PT LONG TERM GOAL #2   Title negotiate at least 3 stairs reciprocally without AD or rail for improved strength and function   Baseline needs rails for 6 inch steps.  Is pulling on her hands less.   Time 8   Period Weeks   Status On-going   PT  LONG TERM GOAL #3   Title report ability to walk > 30 min without increase in pain for improved function    Time 8   Period Weeks   Status Unable to assess   PT LONG TERM GOAL #4   Title demonstrate 10 reps SLR bil without extensor lag for improved strength   Time 8   Period Weeks   Status Unable to assess               Plan - 07/11/15 1154    Clinical Impression Statement Shamirah continues to demonstrate limitation with flexion of bil knees with R>L. Worked on manual trigger point release of the R quad and contract relax stretching to promote relief of quad tightness to promote flexion but only  achieved 94 degrees. Plan to discuss with pt next visit about the group class due to pt being self pay and her reaching  33 visits with little progress with mobility and strength in regard to extensor lag so she can continue to work on strenghtening in area's that would benefit her.    PT Next Visit Plan Resisted walking, step downs, Stepper vs Nu step with increased resistance? [facility] exercises, Discuss group exercise class (pt is self pay) so she can continue working areas she would like to.    Consulted and Agree with Plan of Care Patient        Problem List Patient Active Problem List   Diagnosis Date Noted  . Other bilateral secondary osteoarthritis of knee 04/03/2015  . Abnormality of gait 04/26/2014  . 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 Family history of diabetes mellitus (DM) 01/23/2014  . Right knee DJD 01/23/2014  . Hyperlipidemia 01/23/2014  . HYPERLIPIDEMIA 08/14/2007  . DEPRESSION 08/14/2007  . GERD 08/14/2007  . OSTEOARTHRITIS 08/14/2007  . HEADACHE, CHRONIC 08/14/2007   [name] PT, DPT, LAT, ATC  07/11/2015  12:00 PM      [place], [place], [address] Phone: [phone]   Fax:  [phone]  Name: Kekoa Bluhm MRN:  [id number] Date of Birth:  01/20/1966     

## 2015-07-14 ENCOUNTER — Ambulatory Visit: Payer: Self-pay | Admitting: Physical Therapy

## 2015-07-14 DIAGNOSIS — M25669 Stiffness of unspecified knee, not elsewhere classified: Secondary | ICD-10-CM

## 2015-07-14 DIAGNOSIS — M25662 Stiffness of left knee, not elsewhere classified: Secondary | ICD-10-CM

## 2015-07-14 DIAGNOSIS — R269 Unspecified abnormalities of gait and mobility: Secondary | ICD-10-CM

## 2015-07-14 DIAGNOSIS — M25561 Pain in right knee: Secondary | ICD-10-CM

## 2015-07-14 DIAGNOSIS — M25562 Pain in left knee: Principal | ICD-10-CM

## 2015-07-14 DIAGNOSIS — R600 Localized edema: Secondary | ICD-10-CM

## 2015-07-14 DIAGNOSIS — M25661 Stiffness of right knee, not elsewhere classified: Secondary | ICD-10-CM

## 2015-07-14 NOTE — Therapy (Signed)
[place], [place], [address] Phone: [phone]   Fax:  [phone]  Physical Therapy Treatment  Patient Details  Name: Masako Overall MRN: [id number] Date of Birth: 1965-08-11 Referring Provider: Dr Meridee Score / Dr. Erlinda Hong  Encounter Date: 07/14/2015      PT End of Session - 07/14/15 0903    Visit Number 34   Date for PT Re-Evaluation 07/25/15   PT Start Time 0803   PT Stop Time 0900   PT Time Calculation (min) 57 min   Activity Tolerance Patient tolerated treatment well   Behavior During Therapy [facility] for tasks assessed/performed      Past Medical History  Diagnosis Date  . Anxiety     controls with exercise   . Indigestion     no medicine at present  . Arthritis     knees , shoulders    Past Surgical History  Procedure Laterality Date  . Abdominal hysterectomy    . Total abdominal hysterectomy w/ bilateral salpingoophorectomy    . Vaginal delivery      ?x2  . Total knee arthroplasty Right 04/03/2015    Procedure: RIGHT TOTAL KNEE ARTHROPLASTY;  Surgeon: Leandrew Koyanagi, MD;  Location: [place];  Service: Orthopedics;  Laterality: Right;  . Total knee arthroplasty Left 04/03/2015    Procedure: LEFT TOTAL KNEE ARTHROPLASTY;  Surgeon: Leandrew Koyanagi, MD;  Location: [place];  Service: Orthopedics;  Laterality: Left;    There were no vitals filed for this visit.  Visit Diagnosis:  Arthralgia of both knees  Decreased range of motion (ROM) of knee  Knee stiffness, left  Localized edema  Abnormality of gait  Knee stiffness, right      Subjective Assessment - 07/14/15 0811    Subjective I walked 40 minutes without rest over the weekend without rest.   My husband says I am doing better.  I walk after breakfast and then I do my exercises.  It is helping.     Patient is accompained by: Interpreter   Currently in Pain? No/denies   Pain Frequency Intermittent   Aggravating Factors  stretches  end range   Pain Relieving Factors exercise,  medication                         [facility] Adult PT Treatment/Exercise - 07/14/15 6761    Knee/Hip Exercises: Aerobic   Nustep L6 8 minutes   Stepper L2 5 minutes   Knee/Hip Exercises: Standing   Walking with Sports Cord 5 X 2 plates porterfiedd pullies.   Other Standing Knee Exercises Gluteal Med dynamic stabilization with weight shift   using [place] shifts    Cryotherapy   Number Minutes Cryotherapy 10 Minutes   Cryotherapy Location Knee  both.  Elevated   Type of Cryotherapy --  cold pack                PT Education - 07/14/15 0902    Education provided Yes   Education Details group exercise option for PT   Person(s) Educated Patient   Methods Explanation   Comprehension Verbalized understanding          PT Short Term Goals - 06/20/15 1054    PT SHORT TERM GOAL #1   Title independent with HEP (05/23/15)   Time 4   Period Weeks   Status Achieved   PT SHORT TERM GOAL #2   Title improve bil knee AROM 0-90  for improved function and mobility (05/23/15)   Time 4   Period Weeks   Status Achieved   PT SHORT TERM GOAL #3   Title ambulate > 150' with single point cane for improved function and mobility    Time 4   Period Weeks   Status Achieved   PT SHORT TERM GOAL #4   Title report ability to stand > 20 min for improved strength and function    Time 4   Period Weeks   Status Achieved           PT Long Term Goals - 07/14/15 7035    PT LONG TERM GOAL #1   Title bil knee AROM 0-105 for improved functional mobility (06/20/15)   Time 8   Period Weeks   Status Unable to assess   PT LONG TERM GOAL #2   Title negotiate at least 3 stairs reciprocally without AD or rail for improved strength and function   Time 8   Period Weeks   Status Unable to assess   PT LONG TERM GOAL #3   Title report ability to walk > 30 min without increase in pain for improved function    Baseline walks 40 minutes ,   did not assess pain level.   Time 8   Period Weeks   Status Partially Met   PT LONG TERM GOAL #4   Title demonstrate 10 reps SLR bil without extensor lag for improved strength   Time 8   Period Weeks   Status Unable to assess               Plan - 07/14/15 0904    Clinical Impression Statement Kymberli is interested in Group sessions starting next week. Closed chain focus today.  Patient demonstrates mor control with [place]orfield pullies.  She is learning how to work the equipment so she may attend the 2nd day and work out on her own. 3/10 tension pain in quads post exercise.    PT Next Visit Plan 2 more visits.  finalize home exercises,  check goals and measure.  Manual helpful.    PT Home Exercise Plan continue   Consulted and Agree with Plan of Care Patient        Problem List Patient Active Problem List   Diagnosis Date Noted  . Other bilateral secondary osteoarthritis of knee 04/03/2015  . Abnormality of gait 04/26/2014  . 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 Family history of diabetes mellitus (DM) 01/23/2014  . Right knee DJD 01/23/2014  . Hyperlipidemia 01/23/2014  . HYPERLIPIDEMIA 08/14/2007  . DEPRESSION 08/14/2007  . GERD 08/14/2007  . OSTEOARTHRITIS 08/14/2007  . HEADACHE, CHRONIC 08/14/2007    [facility] 07/14/2015, 9:09 AM  [facility] [address] [place], [place], [address] Phone: [phone]   Fax:  [phone]  Name: Margene Cherian MRN: [id number] Date of Birth: 11/05/1965    Melvenia Needles, PTA 07/14/2015 9:09 AM Phone: [phone] Fax: [phone]

## 2015-07-16 ENCOUNTER — Ambulatory Visit: Payer: No Typology Code available for payment source | Admitting: Physical Therapy

## 2015-07-16 ENCOUNTER — Ambulatory Visit: Payer: Self-pay | Admitting: Physical Therapy

## 2015-07-16 DIAGNOSIS — M25562 Pain in left knee: Principal | ICD-10-CM

## 2015-07-16 DIAGNOSIS — M25561 Pain in right knee: Secondary | ICD-10-CM

## 2015-07-16 DIAGNOSIS — M25662 Stiffness of left knee, not elsewhere classified: Secondary | ICD-10-CM

## 2015-07-16 DIAGNOSIS — R600 Localized edema: Secondary | ICD-10-CM

## 2015-07-16 DIAGNOSIS — M25661 Stiffness of right knee, not elsewhere classified: Secondary | ICD-10-CM

## 2015-07-16 DIAGNOSIS — R269 Unspecified abnormalities of gait and mobility: Secondary | ICD-10-CM

## 2015-07-16 DIAGNOSIS — M25669 Stiffness of unspecified knee, not elsewhere classified: Secondary | ICD-10-CM

## 2015-07-16 NOTE — Therapy (Signed)
[place], [place], [address] Phone: [phone]   Fax:  [phone]  Physical Therapy Treatment  Patient Details  Name: Tina Johnson MRN: [id number] Date of Birth: November 24, 1965 Referring Provider: Dr Meridee Score / Dr. Erlinda Hong  Encounter Date: 07/16/2015      PT End of Session - 07/16/15 1244    Visit Number 35   Number of Visits 35   Date for PT Re-Evaluation 07/25/15   Authorization Type Self-Pay   PT Start Time 1145   PT Stop Time 1238   PT Time Calculation (min) 53 min   Activity Tolerance Patient tolerated treatment well   Behavior During Therapy [facility] for tasks assessed/performed      Past Medical History  Diagnosis Date  . Anxiety     controls with exercise   . Indigestion     no medicine at present  . Arthritis     knees , shoulders    Past Surgical History  Procedure Laterality Date  . Abdominal hysterectomy    . Total abdominal hysterectomy w/ bilateral salpingoophorectomy    . Vaginal delivery      ?x2  . Total knee arthroplasty Right 04/03/2015    Procedure: RIGHT TOTAL KNEE ARTHROPLASTY;  Surgeon: Leandrew Koyanagi, MD;  Location: [place];  Service: Orthopedics;  Laterality: Right;  . Total knee arthroplasty Left 04/03/2015    Procedure: LEFT TOTAL KNEE ARTHROPLASTY;  Surgeon: Leandrew Koyanagi, MD;  Location: [place];  Service: Orthopedics;  Laterality: Left;    There were no vitals filed for this visit.  Visit Diagnosis:  Arthralgia of both knees  Decreased range of motion (ROM) of knee  Knee stiffness, left  Localized edema  Abnormality of gait  Knee stiffness, right      Subjective Assessment - 07/16/15 1153    Subjective "i've been walking more outside, since its warm it really has been helping with my knees"   Currently in Pain? No/denies                         [facility] Adult PT Treatment/Exercise - 07/16/15 1156    Knee/Hip Exercises: Stretches   Hip  Flexor Stretch 30 seconds;Both;4 reps  contract/ relax stretching with 10 sec hold   Knee/Hip Exercises: Aerobic   Stepper L 1 x 4 min, L2 x 4 min   Knee/Hip Exercises: Standing   Wall Squat 1 set;5 reps  holding 10 sec   Stairs ascending/ descending stairs x 10 with 4 inch steps holding on to both rails  cues to avoid shifting lateral when descending   Knee/Hip Exercises: Seated   Stool Scoot - Round Trips 2 x 20 ft  cues to speed up/ slow down   Moist Heat Therapy   Number Minutes Moist Heat 10 Minutes   Moist Heat Location Knee  bil                PT Education - 07/16/15 1241    Education provided Yes   Education Details proper techniques for ascendig stairs   Person(s) Educated Patient   Methods Explanation   Comprehension Verbalized understanding          PT Short Term Goals - 06/20/15 1054    PT SHORT TERM GOAL #1   Title independent with HEP (05/23/15)   Time 4   Period Weeks   Status Achieved   PT SHORT TERM GOAL #2   Title improve  bil knee AROM 0-90 for improved function and mobility (05/23/15)   Time 4   Period Weeks   Status Achieved   PT SHORT TERM GOAL #3   Title ambulate > 150' with single point cane for improved function and mobility    Time 4   Period Weeks   Status Achieved   PT SHORT TERM GOAL #4   Title report ability to stand > 20 min for improved strength and function    Time 4   Period Weeks   Status Achieved           PT Long Term Goals - 07/14/15 4496    PT LONG TERM GOAL #1   Title bil knee AROM 0-105 for improved functional mobility (06/20/15)   Time 8   Period Weeks   Status Unable to assess   PT LONG TERM GOAL #2   Title negotiate at least 3 stairs reciprocally without AD or rail for improved strength and function   Time 8   Period Weeks   Status Unable to assess   PT LONG TERM GOAL #3   Title report ability to walk > 30 min without increase in pain for improved function    Baseline walks 40 minutes ,  did not assess  pain level.   Time 8   Period Weeks   Status Partially Met   PT LONG TERM GOAL #4   Title demonstrate 10 reps SLR bil without extensor lag for improved strength   Time 8   Period Weeks   Status Unable to assess               Plan - 07/16/15 1246    Clinical Impression Statement Ekta continues to report she is working on her stuff at home. She reported  no pain today before or during todays exercise. worked on IT trainer which she required cueing for proper mechanics with descending due to her trying to shift her weight laterally. discussed group and provided handout of the pt and interpretter translated the handout. Plan to discharge next visit to the group exercise class.     PT Next Visit Plan Finalize home exercises,  check goals and measure.  stairs, discharge, FOTO, GOALS        Problem List Patient Active Problem List   Diagnosis Date Noted  . Other bilateral secondary osteoarthritis of knee 04/03/2015  . Abnormality of gait 04/26/2014  . 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 Family history of diabetes mellitus (DM) 01/23/2014  . Right knee DJD 01/23/2014  . Hyperlipidemia 01/23/2014  . HYPERLIPIDEMIA 08/14/2007  . DEPRESSION 08/14/2007  . GERD 08/14/2007  . OSTEOARTHRITIS 08/14/2007  . HEADACHE, CHRONIC 08/14/2007   [name] PT, DPT, LAT, ATC  07/16/2015  12:52 PM      [facility] [address] [place], [place], [address] Phone: [phone]   Fax:  [phone]  Name: Kalenna Millett MRN: [id number] Date of Birth: [birth year]

## 2015-07-18 ENCOUNTER — Ambulatory Visit: Payer: Self-pay | Admitting: Physical Therapy

## 2015-07-18 DIAGNOSIS — M25562 Pain in left knee: Principal | ICD-10-CM

## 2015-07-18 DIAGNOSIS — R269 Unspecified abnormalities of gait and mobility: Secondary | ICD-10-CM

## 2015-07-18 DIAGNOSIS — M25561 Pain in right knee: Secondary | ICD-10-CM

## 2015-07-18 DIAGNOSIS — M25669 Stiffness of unspecified knee, not elsewhere classified: Secondary | ICD-10-CM

## 2015-07-18 DIAGNOSIS — M25661 Stiffness of right knee, not elsewhere classified: Secondary | ICD-10-CM

## 2015-07-18 DIAGNOSIS — M25662 Stiffness of left knee, not elsewhere classified: Secondary | ICD-10-CM

## 2015-07-18 DIAGNOSIS — R600 Localized edema: Secondary | ICD-10-CM

## 2015-07-18 NOTE — Therapy (Signed)
[place], [place], [address] Phone: [phone]   Fax:  [phone]  Physical Therapy Treatment / Discharge note  Patient Details  Name: Tina Johnson MRN: [id number] Date of Birth: [birth year] Referring Provider: Dr Meridee Score / Dr. Erlinda Hong  Encounter Date: 07/18/2015      PT End of Session - 07/18/15 1014    Visit Number 36   Number of Visits 36   Date for PT Re-Evaluation 07/25/15   PT Start Time 8811   PT Stop Time 1110   PT Time Calculation (min) 55 min   Activity Tolerance Patient tolerated treatment well   Behavior During Therapy [facility] for tasks assessed/performed      Past Medical History  Diagnosis Date  . Anxiety     controls with exercise   . Indigestion     no medicine at present  . Arthritis     knees , shoulders    Past Surgical History  Procedure Laterality Date  . Abdominal hysterectomy    . Total abdominal hysterectomy w/ bilateral salpingoophorectomy    . Vaginal delivery      ?x2  . Total knee arthroplasty Right 04/03/2015    Procedure: RIGHT TOTAL KNEE ARTHROPLASTY;  Surgeon: Leandrew Koyanagi, MD;  Location: [place];  Service: Orthopedics;  Laterality: Right;  . Total knee arthroplasty Left 04/03/2015    Procedure: LEFT TOTAL KNEE ARTHROPLASTY;  Surgeon: Leandrew Koyanagi, MD;  Location: [place];  Service: Orthopedics;  Laterality: Left;    There were no vitals filed for this visit.  Visit Diagnosis:  Arthralgia of both knees  Decreased range of motion (ROM) of knee  Knee stiffness, left  Localized edema  Abnormality of gait  Knee stiffness, right          [facility] PT Assessment - 07/18/15 1032    Observation/Other Assessments   Focus on Therapeutic Outcomes (FOTO)  58% limited   AROM   Right Knee Extension 0   Right Knee Flexion 91   Left Knee Extension 0   Left Knee Flexion 95   Strength   Right Hip Flexion 4-/5   Right Hip Extension 4-/5   Right Hip  ABduction 4-/5   Right Hip ADduction 4/5   Left Hip Flexion 4-/5   Left Hip Extension 4-/5   Left Hip ABduction 4-/5   Left Hip ADduction 4/5   Right Knee Flexion 4-/5   Right Knee Extension 3+/5   Left Knee Flexion 4-/5   Left Knee Extension 4/5                     [facility] Adult PT Treatment/Exercise - 07/18/15 0001    Knee/Hip Exercises: Aerobic   Stepper L1 x 5 min, L2 x 5 min   Knee/Hip Exercises: Standing   Stairs ascending/ descending stairs 2 x 5 with 4 x 6 inch steps holding on to both rails                  PT Short Term Goals - 06/20/15 1054    PT SHORT TERM GOAL #1   Title independent with HEP (05/23/15)   Time 4   Period Weeks   Status Achieved   PT SHORT TERM GOAL #2   Title improve bil knee AROM 0-90 for improved function and mobility (05/23/15)   Time 4   Period Weeks   Status Achieved   PT SHORT TERM GOAL #3   Title ambulate >  150' with single point cane for improved function and mobility    Time 4   Period Weeks   Status Achieved   PT SHORT TERM GOAL #4   Title report ability to stand > 20 min for improved strength and function    Time 4   Period Weeks   Status Achieved           PT Long Term Goals - 07/18/15 1100    PT LONG TERM GOAL #1   Title bil knee AROM 0-105 for improved functional mobility (06/20/15)   Time 8   Period Weeks   Status Partially Met   PT LONG TERM GOAL #2   Title negotiate at least 3 stairs reciprocally without AD or rail for improved strength and function   Baseline can do steps reciprocally but uses both railings   Time 8   Period Weeks   Status Partially Met   PT LONG TERM GOAL #3   Title report ability to walk > 30 min without increase in pain for improved function    Baseline walking 1 hour   Time 8   Period Weeks   Status Achieved   PT LONG TERM GOAL #4   Title demonstrate 10 reps SLR bil without extensor lag for improved strength   Baseline lag   Time 8   Period Weeks   Status Not Met                Plan - 07/18/15 1102    Clinical Impression Statement Tina Johnson reports she is very pleased with how far she has come along. She reports that she has been able to walk for about an hour with a SPC. she met all STG, and LTG #4, and partially met LTG #1, #2, and did not meet LTG #3. She was able to do the stair stepper exercise and  and navigagte up/ down stairs with 4 inch heigh and 6 inch heigh using railings with decrease shifting of weight laterally. Pt plans to begin with group exercise starting next week and will be discharged from physical.    PT Next Visit Plan Discharged   PT Home Exercise Plan HEP review   Consulted and Agree with Plan of Care Patient        Problem List Patient Active Problem List   Diagnosis Date Noted  . Other bilateral secondary osteoarthritis of knee 04/03/2015  . Abnormality of gait 04/26/2014  . 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 Family history of diabetes mellitus (DM) 01/23/2014  . Right knee DJD 01/23/2014  . Hyperlipidemia 01/23/2014  . HYPERLIPIDEMIA 08/14/2007  . DEPRESSION 08/14/2007  . GERD 08/14/2007  . OSTEOARTHRITIS 08/14/2007  . HEADACHE, CHRONIC 08/14/2007   [name] PT, DPT, LAT, ATC  07/18/2015  11:11 AM      [place] [facility] [address] [place], [place], [address] Phone: [phone]   Fax:  [phone]  Name: Tina Johnson MRN: [id number] Date of Birth: 08-17-1965  PHYSICAL THERAPY DISCHARGE SUMMARY  Visits from Start of Care: 36  Current functional level related to goals / functional outcomes: See goals   Remaining deficits: Limited AROM of bilateral knee flexion/ extension with tightness and soreness at end range. Weakness of bilateral hip and knee musculature with significant weakness of the R knee extensors compared bil. She continues to exhibit an antalgic gait pattern using a SPC  ambulating with her knees straight which she is  able to correct moderately with cueing, but exhibits limited stride. Pt reports being pleased with her current level of function but plans to attend group to work on area's she would like to address.    Education / Equipment: HEP, posture education, theraband for strengthening.   Plan: Patient agrees to discharge.  Patient goals were partially met. Patient is being discharged due to financial reasons.  ?????

## 2015-12-06 ENCOUNTER — Encounter (INDEPENDENT_AMBULATORY_CARE_PROVIDER_SITE_OTHER): Payer: Self-pay

## 2016-01-20 ENCOUNTER — Ambulatory Visit: Payer: Self-pay | Attending: Internal Medicine

## 2016-01-21 ENCOUNTER — Telehealth: Payer: Self-pay | Admitting: Internal Medicine

## 2016-01-21 ENCOUNTER — Encounter: Payer: Self-pay | Admitting: Internal Medicine

## 2016-01-21 ENCOUNTER — Other Ambulatory Visit: Payer: Self-pay | Admitting: Internal Medicine

## 2016-01-21 ENCOUNTER — Ambulatory Visit: Payer: Self-pay | Attending: Internal Medicine | Admitting: Internal Medicine

## 2016-01-21 VITALS — BP 152/83 | HR 50 | Temp 97.6°F | Resp 16 | Wt 115.8 lb

## 2016-01-21 DIAGNOSIS — Z96652 Presence of left artificial knee joint: Secondary | ICD-10-CM | POA: Insufficient documentation

## 2016-01-21 DIAGNOSIS — M174 Other bilateral secondary osteoarthritis of knee: Secondary | ICD-10-CM | POA: Insufficient documentation

## 2016-01-21 DIAGNOSIS — R635 Abnormal weight gain: Secondary | ICD-10-CM | POA: Insufficient documentation

## 2016-01-21 DIAGNOSIS — Z79899 Other long term (current) drug therapy: Secondary | ICD-10-CM | POA: Insufficient documentation

## 2016-01-21 DIAGNOSIS — Z114 Encounter for screening for human immunodeficiency virus [HIV]: Secondary | ICD-10-CM | POA: Insufficient documentation

## 2016-01-21 DIAGNOSIS — I10 Essential (primary) hypertension: Secondary | ICD-10-CM | POA: Insufficient documentation

## 2016-01-21 DIAGNOSIS — Z23 Encounter for immunization: Secondary | ICD-10-CM | POA: Insufficient documentation

## 2016-01-21 DIAGNOSIS — K219 Gastro-esophageal reflux disease without esophagitis: Secondary | ICD-10-CM | POA: Insufficient documentation

## 2016-01-21 DIAGNOSIS — Z1159 Encounter for screening for other viral diseases: Secondary | ICD-10-CM | POA: Insufficient documentation

## 2016-01-21 DIAGNOSIS — E2839 Other primary ovarian failure: Secondary | ICD-10-CM | POA: Insufficient documentation

## 2016-01-21 DIAGNOSIS — Z131 Encounter for screening for diabetes mellitus: Secondary | ICD-10-CM | POA: Insufficient documentation

## 2016-01-21 DIAGNOSIS — E785 Hyperlipidemia, unspecified: Secondary | ICD-10-CM | POA: Insufficient documentation

## 2016-01-21 LAB — CBC WITH DIFFERENTIAL/PLATELET
BASOS PCT: 1 %
Basophils Absolute: 69 cells/uL (ref 0–200)
EOS PCT: 2 %
Eosinophils Absolute: 138 cells/uL (ref 15–500)
HEMATOCRIT: 44.1 % (ref 35.0–45.0)
Hemoglobin: 14.5 g/dL (ref 11.7–15.5)
LYMPHS PCT: 43 %
Lymphs Abs: 2967 cells/uL (ref 850–3900)
MCH: 30 pg (ref 27.0–33.0)
MCHC: 32.9 g/dL (ref 32.0–36.0)
MCV: 91.3 fL (ref 80.0–100.0)
MONO ABS: 483 {cells}/uL (ref 200–950)
MPV: 10.5 fL (ref 7.5–12.5)
Monocytes Relative: 7 %
NEUTROS PCT: 47 %
Neutro Abs: 3243 cells/uL (ref 1500–7800)
PLATELETS: 301 10*3/uL (ref 140–400)
RBC: 4.83 MIL/uL (ref 3.80–5.10)
RDW: 13.9 % (ref 11.0–15.0)
WBC: 6.9 10*3/uL (ref 3.8–10.8)

## 2016-01-21 LAB — POCT GLYCOSYLATED HEMOGLOBIN (HGB A1C): HEMOGLOBIN A1C: 5

## 2016-01-21 LAB — BASIC METABOLIC PANEL WITH GFR
BUN: 19 mg/dL (ref 7–25)
CALCIUM: 9.6 mg/dL (ref 8.6–10.4)
CO2: 25 mmol/L (ref 20–31)
Chloride: 101 mmol/L (ref 98–110)
Creat: 0.53 mg/dL (ref 0.50–1.05)
GLUCOSE: 92 mg/dL (ref 65–99)
POTASSIUM: 4.8 mmol/L (ref 3.5–5.3)
SODIUM: 138 mmol/L (ref 135–146)

## 2016-01-21 LAB — LIPID PANEL
CHOL/HDL RATIO: 3.8 ratio (ref <=5.0)
Cholesterol: 214 mg/dL — ABNORMAL HIGH (ref 125–200)
HDL: 56 mg/dL (ref >=46)
LDL CALC: 138 mg/dL — AB (ref <130)
Triglycerides: 101 mg/dL (ref <150)
VLDL: 20 mg/dL (ref <30)

## 2016-01-21 LAB — TSH: TSH: 3.12 m[IU]/L

## 2016-01-21 NOTE — Telephone Encounter (Signed)
Thank you :)

## 2016-01-21 NOTE — Progress Notes (Signed)
Pt is in the office today for establish care Pt states she is not in any pain Pt states she is more concerned about her cholesterol levels

## 2016-01-21 NOTE — Patient Instructions (Addendum)
Influenza Virus Vaccine injection (Fluarix) Qu es este medicamento? La VACUNA ANTIGRIPAL ayuda a disminuir el riesgo de contraer la influenza, tambin conocida como la gripe. La vacuna solo ayuda a protegerle contra algunas cepas de influenza. Esta vacuna no ayuda a reducir Catering manager de contraer influenza pandmica [id number]. Este medicamento puede ser utilizado para otros usos; si tiene alguna pregunta consulte con su proveedor de atencin mdica o con su farmacutico. Qu le debo informar a mi profesional de la salud antes de tomar este medicamento? Necesita saber si usted presenta alguno de los siguientes problemas o situaciones: -trastorno de sangrado como hemofilia -fiebre o infeccin -sndrome de Guillain-Barre u otros problemas neurolgicos -problemas del sistema inmunolgico -infeccin por el virus de la inmunodeficiencia humana (VIH) o SIDA -niveles bajos de plaquetas en la sangre -esclerosis mltiple -una Risk analyst o inusual a las vacunas antigripales, a los huevos, protenas de pollo, al ltex, a la gentamicina, a otros medicamentos, alimentos, colorantes o conservantes -si est embarazada o buscando quedar embarazada -si est amamantando a un beb Cmo debo utilizar este medicamento? Esta vacuna se administra mediante inyeccin por va intramuscular. Lo administra un profesional de [employer]	[place]. Recibir una copia de informacin escrita sobre la vacuna antes de cada vacuna. Asegrese de leer este folleto cada vez cuidadosamente. Este folleto puede cambiar con frecuencia. Hable con su pediatra para informarse acerca del uso de este medicamento en nios. Puede requerir atencin especial. Sobredosis: Pngase en contacto inmediatamente con un centro toxicolgico o una sala de urgencia si usted cree que haya tomado demasiado medicamento. ATENCIN: [employer] es solo para usted. No comparta este medicamento con nadie. Qu sucede si me olvido de una dosis? No se aplica en este  caso. Qu puede interactuar con este medicamento? -quimioterapia o radioterapia -medicamentos que suprimen el sistema inmunolgico, tales como etanercept, anakinra, infliximab y adalimumab -medicamentos que tratan o previenen cogulos sanguneos, como warfarina -fenitona -medicamentos esteroideos, como la prednisona o la cortisona -teofilina -vacunas Puede ser que esta lista no menciona todas las posibles interacciones. Informe a su profesional de [employer]	[place] de [employer] productos a base de hierbas, medicamentos de [place] o suplementos nutritivos que est tomando. Si usted fuma, consume bebidas alcohlicas o si utiliza drogas ilegales, indqueselo tambin a su profesional de [employer]	[place]. Algunas sustancias pueden interactuar con su medicamento. A qu debo estar atento al usar [employer]? Informe a su mdico o a Barrister's clerk de la [employer] todos los efectos secundarios que persistan despus de 3 das. Llame a su proveedor de atencin mdica si se presentan sntomas inusuales dentro de las 6 semanas posteriores a la vacunacin. Es posible que todava pueda contraer la gripe, pero la enfermedad no ser tan fuerte como normalmente. No puede contraer la gripe de esta vacuna. La vacuna antigripal no le protege contra resfros u otras enfermedades que pueden causar [place]. Debe vacunarse cada ao. Qu efectos secundarios puedo tener al [employer] este medicamento? Efectos secundarios que debe informar a su mdico o a Barrister's clerk de la salud tan pronto como sea posible: -reacciones alrgicas como erupcin cutnea, picazn o urticarias, hinchazn de la cara, labios o lengua Efectos secundarios que, por lo general, no requieren atencin mdica (debe informarlos a su mdico o a su profesional de la salud si persisten o si son molestos): -fiebre -dolor de cabeza -molestias y dolores musculares -dolor, sensibilidad, enrojecimiento o Estate agent de la inyeccin -cansancio o  debilidad Puede ser que esta lista no menciona todos los posibles  efectos secundarios. Comunquese a su mdico por asesoramiento mdico [employer]. Usted puede informar los efectos secundarios a la FDA por telfono al 1-800-FDA-1088. Dnde debo guardar mi medicina? Esta vacuna se administra solamente en clnicas, farmacias, consultorio mdico u otro consultorio de un profesional de la salud y no Sports coach en su domicilio. ATENCIN: Este folleto es un resumen. Puede ser que no cubra toda la posible informacin. Si usted tiene preguntas acerca de esta medicina, consulte con su mdico, su farmacutico o su profesional de Technical sales engineer.    2016, Elsevier/Gold Standard. (2009-10-07 15:31:40)   - Plan de alimentacin DASH (DASH Eating Plan) DASH es la sigla en ingls de "Enfoques Alimentarios para Detener la Hipertensin". El plan de alimentacin DASH ha demostrado bajar la presin arterial elevada (hipertensin). Los beneficios adicionales para la salud pueden incluir la disminucin del riesgo de diabetes mellitus tipo2, enfermedades cardacas e ictus. Este plan tambin puede ayudar a Horticulturist, commercial. QU DEBO SABER ACERCA DEL PLAN DE ALIMENTACIN DASH? Para el plan de alimentacin DASH, seguir las siguientes pautas generales:  Elija los alimentos con un valor porcentual diario de sodio de menos del 5% (segn figura en la etiqueta del alimento).  Use hierbas o aderezos sin sal, en lugar de sal de mesa o sal marina.  Consulte al mdico o farmacutico antes de usar sustitutos de la sal.  Coma productos con bajo contenido de sodio, cuya etiqueta suele decir "bajo contenido de sodio" o "sin agregado de sal".  Coma alimentos frescos.  Coma ms verduras, frutas y productos lcteos con bajo contenido de [place].  Elija los cereales integrales. Busque la palabra "integral" en Equities trader de la lista de ingredientes.  Elija el pescado y el pollo o el pavo sin piel ms a  menudo que las carnes rojas. Limite el consumo de pescado, carne de ave y carne a 6onzas (170g) por Training and development officer.  Limite el consumo de dulces, postres, azcares y bebidas azucaradas.  Elija las grasas saludables para el corazn.  Limite el consumo de queso a 1onza (28g) por Training and development officer.  Consuma ms comida casera y menos de restaurante, de buf y comida rpida.  Limite el consumo de alimentos fritos.  Cocine los alimentos utilizando mtodos que no sean la fritura.  Limite las verduras enlatadas. Si las consume, enjuguelas bien para disminuir el sodio.  Cuando coma en un restaurante, pida que preparen su comida con menos sal o, en lo posible, sin nada de sal. QU ALIMENTOS PUEDO COMER? Pida ayuda a un nutricionista para conocer las necesidades calricas individuales. Cereales Pan de salvado o integral. Arroz integral. Pastas de salvado o integrales. Quinua, trigo burgol y cereales integrales. Cereales con bajo contenido de sodio. Tortillas de harina de maz o de salvado. Pan de maz integral. Galletas saladas integrales. Galletas con bajo contenido de [place]. Vegetales Verduras frescas o congeladas (crudas, al vapor, asadas o grilladas). Jugos de tomate y verduras con contenido bajo o reducido de sodio. Pasta y salsa de tomate con contenido bajo o [place]. Verduras enlatadas con bajo contenido de sodio o reducido de sodio.  Lambert Mody Lambert Mody frescas, en conserva (en su jugo natural) o frutas congeladas. Carnes y otros productos con protenas Carne de res molida (al 85% o ms [place]), carne de res de animales alimentados con pastos o carne de res sin la grasa. Pollo o pavo sin piel. Carne de pollo o de [place]. Cerdo sin la grasa. Todos los pescados y frutos de mar. Huevos. Porotos, guisantes o  lentejas secos. Frutos secos y semillas sin sal. Frijoles enlatados sin sal. Lcteos Productos lcteos con bajo contenido de grasas, como Secretary o al 1%, quesos reducidos en grasas o al  2%, ricota con bajo contenido de grasas o [employer], o yogur natural con bajo contenido de [place]. Quesos con contenido bajo o reducido de sodio. Grasas y Naval architect en barra que no contengan grasas trans. [place] y alios para ensaladas livianos o reducidos en grasas (reducidos en sodio). [place]. Aceites de crtamo, oliva o canola. [place] natural de man o almendra. Otros Palomitas de maz y pretzels sin sal. Los artculos mencionados arriba pueden no ser [employer] de las bebidas o los alimentos recomendados. Comunquese con el nutricionista para conocer ms opciones. QU ALIMENTOS NO SE RECOMIENDAN? Cereales Pan blanco. Pastas blancas. Arroz blanco. Pan de maz refinado. Bagels y croissants. Galletas saladas que contengan grasas trans. Vegetales Vegetales con crema o fritos. Verduras en [place]. Verduras enlatadas comunes. Pasta y salsa de tomate en lata comunes. Jugos comunes de tomate y de verduras. Lambert Mody Frutas secas. Fruta enlatada en almbar liviano o espeso. Jugo de frutas. Carnes y otros productos con protenas [place] de carne con Lobbyist. Costillas, alas de pollo, tocineta, salchicha, mortadela, salame, chinchulines, tocino, perros calientes, salchichas alemanas y embutidos envasados. Frutos secos y semillas con sal. Frijoles con sal en lata. Lcteos Leche entera o al 2%, crema, mezcla de [place] y crema, y queso crema. Yogur entero o endulzado. Quesos o queso azul con alto contenido de Physicist, medical. [place] no lcteas y coberturas batidas. Quesos procesados, quesos para untar o cuajadas. Condimentos Sal de cebolla y ajo, sal condimentada, sal de mesa y sal marina. Salsas en lata y envasadas. Salsa Worcestershire. Salsa trtara. Salsa barbacoa. Salsa teriyaki. Salsa de soja, incluso la que tiene contenido reducido de [place]. Salsa de carne. Salsa de pescado. Salsa de [place]. Salsa rosada. Rbano picante. Ketchup y mostaza. Saborizantes y tiernizantes para  carne. Caldo en cubitos. Salsa picante. Salsa tabasco. Adobos. Aderezos para tacos. Salsas. Grasas y aceites [place], [place] en barra, [place] de [place], [place], [place] clarificada y Wendee Copp de tocino. Aceites de coco, de palmiste o de palma. Aderezos comunes para ensalada. Otros Pickles y [place]. Palomitas de maz y pretzels con sal. Los artculos mencionados arriba pueden no ser [employer] de las bebidas y los alimentos que se Higher education careers adviser. Comunquese con el nutricionista para obtener ms informacin. DNDE Dolan Amen MS INFORMACIN? [place], del Pulmn y de Herbalist (National Heart, Lung, and [place]): travelstabloid.com   Esta informacin no tiene Marine scientist el consejo del mdico. Asegrese de hacerle al mdico cualquier pregunta que tenga.   Document Released: 03/25/2011 Document Revised: 04/26/2014 Elsevier Interactive Patient Education 2016 [employer].  -  Hipertensin (Hypertension) El trmino hipertensin es otra forma de denominar a la presin arterial elevada. La presin arterial elevada fuerza al corazn a trabajar ms para bombear la sangre. Una lectura de la presin arterial consta de dos nmeros: uno ms alto sobre uno ms bajo (por ejemplo, 110/72). CUIDADOS EN EL HOGAR   Haga que el mdico le tome nuevamente la presin arterial.  Tome los medicamentos solamente como se lo haya indicado el mdico. Siga cuidadosamente las indicaciones. Los medicamentos pierden eficacia si omite dosis. El hecho de omitir las dosis tambin [place] el riesgo de otros problemas.  No fume.  Contrlese la presin arterial en su casa como se lo haya indicado el mdico. SOLICITE AYUDA SI:  Piensa que tiene una reaccin a los medicamentos que est tomando.  Tiene mareos o dolores de cabeza reiterados.  Se le inflaman (hinchan) los tobillos.  Tiene problemas de visin. SOLICITE AYUDA DE  INMEDIATO SI:   Tiene un dolor de cabeza muy intenso y est confundido.  Se siente dbil, aturdido o se desmaya.  Tiene dolor en el pecho o el estmago (abdominal).  Tiene vmitos.  No puede respirar [employer]. ASEGRESE DE QUE:   Comprende estas instrucciones.  Controlar su afeccin.  Recibir ayuda de inmediato si no mejora o si empeora.   Esta informacin no tiene Marine scientist el consejo del mdico. Asegrese de hacerle al mdico cualquier pregunta que tenga.   Document Released: 09/23/2009 Document Revised: 04/10/2013 Elsevier Interactive Patient Education [employer].  - Menopause is a normal process in which your reproductive ability comes to an end. This process happens gradually over a span of months to years, usually between the ages of 19 and 4. Menopause is complete when you have missed 12 consecutive menstrual periods. It is important to talk with your health care provider about some of the most common conditions that affect postmenopausal women, such as heart disease, cancer, and bone loss (osteoporosis). Adopting a healthy lifestyle and getting preventive care can help to promote your health and wellness. Those actions can also lower your chances of developing some of these common conditions. WHAT SHOULD I KNOW ABOUT MENOPAUSE? During menopause, you may experience a number of symptoms, such as:  Moderate-to-severe hot flashes.  Night sweats.  Decrease in sex drive.  Mood swings.  Headaches.  Tiredness.  Irritability.  Memory problems.  Insomnia. Choosing to treat or not to treat menopausal changes is an individual decision that you make with your health care provider. WHAT SHOULD I KNOW ABOUT HORMONE REPLACEMENT THERAPY AND SUPPLEMENTS? Hormone therapy products are effective for treating symptoms that are associated with menopause, such as hot flashes and night sweats. Hormone replacement carries certain risks, especially as you become  older. If you are thinking about using estrogen or estrogen with progestin treatments, discuss the benefits and risks with your health care provider. WHAT SHOULD I KNOW ABOUT HEART DISEASE AND STROKE? Heart disease, heart attack, and stroke become more likely as you age. This may be due, in part, to the hormonal changes that your body experiences during menopause. These can affect how your body processes dietary fats, triglycerides, and cholesterol. Heart attack and stroke are both medical emergencies. There are many things that you can do to help prevent heart disease and stroke:  Have your blood pressure checked at least every 1-2 years. High blood pressure causes heart disease and increases the risk of stroke.  If you are 84-29 years old, ask your health care provider if you should take aspirin to prevent a heart attack or a stroke.  Do not use any tobacco products, including cigarettes, chewing tobacco, or electronic cigarettes. If you need help quitting, ask your health care provider.  It is important to eat a healthy diet and maintain a healthy weight.  Be sure to include plenty of vegetables, fruits, low-fat dairy products, and lean protein.  Avoid eating foods that are high in solid fats, added sugars, or salt (sodium).  Get regular exercise. This is one of the most important things that you can do for your health.  Try to exercise for at least 150 minutes each week. The type of exercise that you do should increase your heart rate and  make you sweat. This is known as moderate-intensity exercise.  Try to do strengthening exercises at least twice each week. Do these in addition to the moderate-intensity exercise.  Know your numbers.Ask your health care provider to check your cholesterol and your blood glucose. Continue to have your blood tested as directed by your health care provider. WHAT SHOULD I KNOW ABOUT CANCER SCREENING? There are several types of cancer. Take the following  steps to reduce your risk and to catch any cancer development as early as possible. Breast Cancer  Practice breast self-awareness.  This means understanding how your breasts normally appear and feel.  It also means doing regular breast self-exams. Let your health care provider know about any changes, no matter how small.  If you are 22 or older, have a clinician do a breast exam (clinical breast exam or CBE) every year. Depending on your age, family history, and medical history, it may be recommended that you also have a yearly breast X-ray (mammogram).  If you have a family history of breast cancer, talk with your health care provider about genetic screening.  If you are at high risk for breast cancer, talk with your health care provider about having an MRI and a mammogram every year.  Breast cancer (BRCA) gene test is recommended for women who have family members with BRCA-related cancers. Results of the assessment will determine the need for genetic counseling and BRCA1 and for BRCA2 testing. BRCA-related cancers include these types:  Breast. This occurs in males or females.  Ovarian.  Tubal. This may also be called fallopian tube cancer.  Cancer of the abdominal or pelvic lining (peritoneal cancer).  Prostate.  Pancreatic. Cervical, Uterine, and Ovarian Cancer Your health care provider may recommend that you be screened regularly for cancer of the pelvic organs. These include your ovaries, uterus, and vagina. This screening involves a pelvic exam, which includes checking for microscopic changes to the surface of your cervix (Pap test).  For women ages 21-65, health care providers may recommend a pelvic exam and a Pap test every three years. For women ages 32-65, they may recommend the Pap test and pelvic exam, combined with testing for human papilloma virus (HPV), every five years. Some types of HPV increase your risk of cervical cancer. Testing for HPV may also be done on women of  any age who have unclear Pap test results.  Other health care providers may not recommend any screening for nonpregnant women who are considered low risk for pelvic cancer and have no symptoms. Ask your health care provider if a screening pelvic exam is right for you.  If you have had past treatment for cervical cancer or a condition that could lead to cancer, you need Pap tests and screening for cancer for at least 20 years after your treatment. If Pap tests have been discontinued for you, your risk factors (such as having a new sexual partner) need to be reassessed to determine if you should start having screenings again. Some women have medical problems that increase the chance of getting cervical cancer. In these cases, your health care provider may recommend that you have screening and Pap tests more often.  If you have a family history of uterine cancer or ovarian cancer, talk with your health care provider about genetic screening.  If you have vaginal bleeding after reaching menopause, tell your health care provider.  There are currently no reliable tests available to screen for ovarian cancer. Lung Cancer Lung cancer screening is recommended  for adults 65-29 years old who are at high risk for lung cancer because of a history of smoking. A yearly low-dose CT scan of the lungs is recommended if you:  Currently smoke.  Have a history of at least 30 pack-years of smoking and you currently smoke or have quit within the past 15 years. A pack-year is smoking an average of one pack of cigarettes per day for one year. Yearly screening should:  Continue until it has been 15 years since you quit.  Stop if you develop a health problem that would prevent you from having lung cancer treatment. Colorectal Cancer  This type of cancer can be detected and can often be prevented.  Routine colorectal cancer screening usually begins at age 71 and continues through age 44.  If you have risk factors for  colon cancer, your health care provider may recommend that you be screened at an earlier age.  If you have a family history of colorectal cancer, talk with your health care provider about genetic screening.  Your health care provider may also recommend using home test kits to check for hidden blood in your stool.  A small camera at the end of a tube can be used to examine your colon directly (sigmoidoscopy or colonoscopy). This is done to check for the earliest forms of colorectal cancer.  Direct examination of the colon should be repeated every 5-10 years until age 81. However, if early forms of precancerous polyps or small growths are found or if you have a family history or genetic risk for colorectal cancer, you may need to be screened more often. Skin Cancer  Check your skin from head to toe regularly.  Monitor any moles. Be sure to tell your health care provider:  About any new moles or changes in moles, especially if there is a change in a mole's shape or color.  If you have a mole that is larger than the size of a pencil eraser.  If any of your family members has a history of skin cancer, especially at a young age, talk with your health care provider about genetic screening.  Always use sunscreen. Apply sunscreen liberally and repeatedly throughout the day.  Whenever you are outside, protect yourself by wearing long sleeves, pants, a wide-brimmed hat, and sunglasses. WHAT SHOULD I KNOW ABOUT OSTEOPOROSIS? Osteoporosis is a condition in which bone destruction happens more quickly than new bone creation. After menopause, you may be at an increased risk for osteoporosis. To help prevent osteoporosis or the bone fractures that can happen because of osteoporosis, the following is recommended:  If you are 7-59 years old, get at least 1,000 mg of calcium and at least 600 mg of vitamin D per day.  If you are older than age 6 but younger than age 10, get at least 1,200 mg of calcium and  at least 600 mg of vitamin D per day.  If you are older than age 30, get at least 1,200 mg of calcium and at least 800 mg of vitamin D per day. Smoking and excessive alcohol intake increase the risk of osteoporosis. Eat foods that are rich in calcium and vitamin D, and do weight-bearing exercises several times each week as directed by your health care provider. WHAT SHOULD I KNOW ABOUT HOW MENOPAUSE AFFECTS [place]? Depression may occur at any age, but it is more common as you become older. Common symptoms of depression include:  Low or sad mood.  Changes in sleep patterns.  Changes in appetite or eating patterns.  Feeling an overall lack of motivation or enjoyment of activities that you previously enjoyed.  Frequent crying spells. Talk with your health care provider if you think that you are experiencing depression. WHAT SHOULD I KNOW ABOUT IMMUNIZATIONS? It is important that you get and maintain your immunizations. These include:  Tetanus, diphtheria, and pertussis (Tdap) booster vaccine.  Influenza every year before the flu season begins.  Pneumonia vaccine.  Shingles vaccine. Your health care provider may also recommend other immunizations.   This information is not intended to replace advice given to you by your health care provider. Make sure you discuss any questions you have with your health care provider.   Document Released: 05/28/2005 Document Revised: 04/26/2014 Document Reviewed: 12/06/2013 Elsevier Interactive Patient Education [employer].

## 2016-01-21 NOTE — Telephone Encounter (Signed)
Patient saw PCP today and forgot to tell her that she needed a referral for an eye doctor. Please follow up.   Thank you

## 2016-01-21 NOTE — Progress Notes (Signed)
Tina Johnson, is a 50 y.o. female  [id number]  [id number]  DOB - [birth year]  CC:  Chief Complaint  Patient presents with  . Establish Care       HPI: Tina Johnson is a 50 y.o. female here today to establish medical care.  Last seen in clinic 2015, w/ pmhx of gerd (controlled by diet per pt), hld and bilat OA knees.  No prior hx of htn that she knew of. Denies eating a lot of salty foods.  Per pt, seeing obgyn yearly, recently saw in June 2017 and had papsmear than.  Still taking Estradial.  On daily vit D 2000iu but does not take all the time, thought it caused dizziness at one time, has not recurred.  Doing well since her Left knee replacement in 12/16. Did well w/ PT afterwards and her gait is much improved/normalized. Not taking any pain rx now.  Does not smoke or drink etoh.  Since knee surgery, noted weight gain.  Patient has No headache, No chest pain, No abdominal pain - No Nausea, No new weakness tingling or numbness, No Cough - SOB.  Denies bleeding concerns.  Hx of egd/cscope 07/2006, nml per pt.    Review of Systems: Per hpi, o/w all systems reviewed and negative.  Allergies  Allergen Reactions  . Latex Rash   Past Medical History:  Diagnosis Date  . Anxiety    controls with exercise   . Arthritis    knees , shoulders  . Indigestion    no medicine at present   Current Outpatient Prescriptions on File Prior to Visit  Medication Sig Dispense Refill  . Omega 3 1000 MG CAPS Take 1,000 mg by mouth daily.    Marland Kitchen conjugated estrogens (PREMARIN) vaginal cream Place 1 Applicatorful vaginally 2 (two) times a week.    . estradiol (CLIMARA - DOSED IN MG/24 HR) 0.1 mg/24hr patch Place 0.1 mg onto the skin once a week.    . estrogens, conjugated, (PREMARIN) 0.625 MG tablet Take 0.625 mg by mouth daily. Take daily for 21 days then do not take for 7 days.    . Ferrous Gluconate (IRON) 240 (27 Fe) MG TABS Take by mouth.    Marland Kitchen OVER THE COUNTER  MEDICATION Take 1 tablet by mouth daily. Herbalife multivitamin    . OVER THE COUNTER MEDICATION Take 1 Dose by mouth daily. Herbalife protein shake    . senna-docusate (SENOKOT S) 8.6-50 MG tablet Take 1 tablet by mouth at bedtime as needed. (Patient not taking: Reported on 01/21/2016) 30 tablet 1  . UNABLE TO FIND Testosterone 4% PLO  Apply peasize amount to inner thigh 2 times a week (Patient not taking: Reported on 01/21/2016) 30 g 0   No current facility-administered medications on file prior to visit.    Family History  Problem Relation Age of Onset  . Diabetes Sister   . Diabetes Brother    Social History   Social History  . Marital status: Married    Spouse name: N/A  . Number of children: N/A  . Years of education: N/A   Occupational History  . Not on file.   Social History Main Topics  . Smoking status: Never Smoker  . Smokeless tobacco: Never Used  . Alcohol use No  . Drug use: No  . Sexual activity: Not on file   Other Topics Concern  . Not on file   Social History Narrative  . No narrative on file    Objective:  Vitals:   01/21/16 0904  BP: (!) 152/83  Pulse: (!) 50  Resp: 16  Temp: 97.6 F (36.4 C)    Filed Weights   01/21/16 0904  Weight: 115 lb 12.8 oz (52.5 kg)    BP Readings from Last 3 Encounters:  01/21/16 (!) 152/83  04/06/15 124/67  03/21/15 (!) 132/50    Physical Exam: Constitutional: Patient appears well-developed and well-nourished. No distress. AAOx3, obese, short statured. Pleasant. HENT: Normocephalic, atraumatic, External right and left ear normal. Oropharynx is clear and moist.  bilat TMs clear. Eyes: Conjunctivae and EOM are normal. PERRL, no scleral icterus. Neck: Normal ROM. Neck supple. No JVD.  CVS: RRR, S1/S2 +, no murmurs, no gallops, no carotid bruit.  Pulmonary: Effort and breath sounds normal, no stridor, rhonchi, wheezes, rales.  Abdominal: Soft. BS +, obese,  NO tenderness, rebound or guarding.    Musculoskeletal: Normal range of motion. No edema and no tenderness.  LE: bilat/ no c/c/e, pulses 2+ bilateral. Neuro: Alert.  muscle tone coordination wnl. No cranial nerve deficit grossly.  Gait wnml. Skin: Skin is warm and dry. No rash noted. Not diaphoretic. No erythema. No pallor. Psychiatric: Normal mood and affect. Behavior, judgment, thought content normal.  Lab Results  Component Value Date   WBC 7.9 04/05/2015   HGB 12.3 04/05/2015   HCT 36.0 04/05/2015   MCV 93.5 04/05/2015   PLT 172 04/05/2015   Lab Results  Component Value Date   CREATININE 0.56 04/04/2015   BUN 7 04/04/2015   NA 139 04/04/2015   K 4.1 04/04/2015   CL 104 04/04/2015   CO2 29 04/04/2015    Lab Results  Component Value Date   HGBA1C 5.3 01/29/2014   Lipid Panel     Component Value Date/Time   CHOL 144 01/29/2014 0921   TRIG 86 01/29/2014 0921   HDL 43 01/29/2014 0921   CHOLHDL 3.3 01/29/2014 0921   VLDL 17 01/29/2014 0921   LDLCALC 84 01/29/2014 0921       Depression screen PHQ 2/9 02/13/2015 05/23/2014 03/07/2014  Decreased Interest 0 0 0  Down, Depressed, Hopeless 0 0 0  PHQ - 2 Score 0 0 0    Assessment and plan:   1. Hyperlipidemia, unspecified hyperlipidemia type Currently fasting, not on rx. - Lipid Panel  2. Other bilateral secondary osteoarthritis of knee Much improved since Left TKA 12/16, currently not on any pain rx.  3. Hypertension, unspecified type First occurrence for pt, wants to try diet/exercise first. - DASH discussed, increase exercise, info provided. - CBC with Differential/Platelet - BASIC METABOLIC PANEL WITH GFR  4. Estrogen deficiency On vit d 2000iu takes most days. - VITAMIN D 25 Hydroxy (Vit-D Deficiency, Fractures)  5. Weight gain Suspect less active since knee surg. - TSH  6. Encounter for hepatitis C screening test for low risk patient - Hepatitis C antibody   7. Encounter for screening for HIV - HIV antibody (with reflex)  8. Dm  screening a1c 5.2  Return in about 2 months (around 03/22/2016).  The patient was given clear instructions to go to ER or return to medical center if symptoms don't improve, worsen or new problems develop. The patient verbalized understanding. The patient was told to call to get lab results if they haven't heard anything in the next week.    This note has been created with Surveyor, quantity. Any transcriptional errors are unintentional.   Maren Reamer, MD, MBA/MHA [place]  [place], [place]   01/21/2016, 9:25 AM

## 2016-01-21 NOTE — Telephone Encounter (Signed)
Unfortunately we don't have resources for uninsured patients .  I will mailed her a letter with eye doctors at low cost Thanks

## 2016-01-21 NOTE — Telephone Encounter (Signed)
Pcp is aware.  

## 2016-01-21 NOTE — Telephone Encounter (Signed)
I called patient and left her a message asking her why she needed a referral to eye doctor as per Dr. Jerrye Bushy request.   Thank you

## 2016-01-22 LAB — VITAMIN D 25 HYDROXY (VIT D DEFICIENCY, FRACTURES): VIT D 25 HYDROXY: 40 ng/mL (ref 30–100)

## 2016-01-22 LAB — HEPATITIS C ANTIBODY: HCV AB: NEGATIVE

## 2016-01-23 ENCOUNTER — Emergency Department (HOSPITAL_COMMUNITY)
Admission: EM | Admit: 2016-01-23 | Discharge: 2016-01-23 | Disposition: A | Discharge status: Home / Self Care | Payer: Self-pay | Attending: Emergency Medicine | Admitting: Emergency Medicine

## 2016-01-23 ENCOUNTER — Encounter (HOSPITAL_COMMUNITY): Payer: Self-pay | Admitting: *Deleted

## 2016-01-23 DIAGNOSIS — Z96653 Presence of artificial knee joint, bilateral: Secondary | ICD-10-CM | POA: Insufficient documentation

## 2016-01-23 DIAGNOSIS — F419 Anxiety disorder, unspecified: Secondary | ICD-10-CM | POA: Insufficient documentation

## 2016-01-23 DIAGNOSIS — R519 Headache, unspecified: Secondary | ICD-10-CM

## 2016-01-23 DIAGNOSIS — R51 Headache: Secondary | ICD-10-CM | POA: Insufficient documentation

## 2016-01-23 LAB — URINALYSIS, ROUTINE W REFLEX MICROSCOPIC
Bilirubin Urine: NEGATIVE
Glucose, UA: NEGATIVE mg/dL
Ketones, ur: NEGATIVE mg/dL
Leukocytes, UA: NEGATIVE
NITRITE: NEGATIVE
PH: 6.5 (ref 5.0–8.0)
Protein, ur: NEGATIVE mg/dL
SPECIFIC GRAVITY, URINE: 1.005 (ref 1.005–1.030)

## 2016-01-23 LAB — COMPREHENSIVE METABOLIC PANEL
ALK PHOS: 77 U/L (ref 38–126)
ALT: 19 U/L (ref 14–54)
AST: 26 U/L (ref 15–41)
Albumin: 4 g/dL (ref 3.5–5.0)
Anion gap: 11 (ref 5–15)
BUN: 20 mg/dL (ref 6–20)
CALCIUM: 10.1 mg/dL (ref 8.9–10.3)
CO2: 25 mmol/L (ref 22–32)
CREATININE: 0.6 mg/dL (ref 0.44–1.00)
Chloride: 99 mmol/L — ABNORMAL LOW (ref 101–111)
Glucose, Bld: 105 mg/dL — ABNORMAL HIGH (ref 65–99)
Potassium: 3.6 mmol/L (ref 3.5–5.1)
Sodium: 135 mmol/L (ref 135–145)
Total Bilirubin: 1 mg/dL (ref 0.3–1.2)
Total Protein: 7.8 g/dL (ref 6.5–8.1)

## 2016-01-23 LAB — CBC WITH DIFFERENTIAL/PLATELET
BASOS PCT: 0 %
Basophils Absolute: 0 10*3/uL (ref 0.0–0.1)
EOS ABS: 0.2 10*3/uL (ref 0.0–0.7)
Eosinophils Relative: 2 %
HCT: 41.7 % (ref 36.0–46.0)
HEMOGLOBIN: 14.1 g/dL (ref 12.0–15.0)
Lymphocytes Relative: 48 %
Lymphs Abs: 4 10*3/uL (ref 0.7–4.0)
MCH: 30.6 pg (ref 26.0–34.0)
MCHC: 33.8 g/dL (ref 30.0–36.0)
MCV: 90.5 fL (ref 78.0–100.0)
Monocytes Absolute: 0.7 10*3/uL (ref 0.1–1.0)
Monocytes Relative: 8 %
NEUTROS PCT: 42 %
Neutro Abs: 3.5 10*3/uL (ref 1.7–7.7)
Platelets: 256 10*3/uL (ref 150–400)
RBC: 4.61 MIL/uL (ref 3.87–5.11)
RDW: 13.5 % (ref 11.5–15.5)
WBC: 8.3 10*3/uL (ref 4.0–10.5)

## 2016-01-23 LAB — URINE MICROSCOPIC-ADD ON

## 2016-01-23 LAB — HIV ANTIBODY (ROUTINE TESTING W REFLEX): HIV 1&2 Ab, 4th Generation: NONREACTIVE

## 2016-01-23 MED ORDER — DIPHENHYDRAMINE HCL 50 MG/ML IJ SOLN
25.0000 mg | Freq: Once | INTRAMUSCULAR | Status: AC
  Administered 2016-01-23: 25 mg via INTRAVENOUS
  Filled 2016-01-23: qty 1

## 2016-01-23 MED ORDER — DEXAMETHASONE SODIUM PHOSPHATE 10 MG/ML IJ SOLN
10.0000 mg | Freq: Once | INTRAMUSCULAR | Status: AC
  Administered 2016-01-23: 10 mg via INTRAVENOUS
  Filled 2016-01-23: qty 1

## 2016-01-23 MED ORDER — LORAZEPAM 2 MG/ML IJ SOLN
1.0000 mg | Freq: Once | INTRAMUSCULAR | Status: AC
  Administered 2016-01-23: 1 mg via INTRAVENOUS
  Filled 2016-01-23: qty 1

## 2016-01-23 MED ORDER — METOCLOPRAMIDE HCL 5 MG/ML IJ SOLN
10.0000 mg | Freq: Once | INTRAMUSCULAR | Status: AC
  Administered 2016-01-23: 10 mg via INTRAVENOUS
  Filled 2016-01-23: qty 2

## 2016-01-23 MED ORDER — METOCLOPRAMIDE HCL 10 MG PO TABS: 10.0000 mg | ORAL_TABLET | Freq: Four times a day (QID) | ORAL | 0 refills | Status: DC | PRN

## 2016-01-23 MED ORDER — SODIUM CHLORIDE 0.9 % IV SOLN
1000.0000 mL | Freq: Once | INTRAVENOUS | Status: AC
  Administered 2016-01-23: 1000 mL via INTRAVENOUS

## 2016-01-23 MED ORDER — OXYCODONE-ACETAMINOPHEN 5-325 MG PO TABS
ORAL_TABLET | ORAL | Status: AC
  Filled 2016-01-23: qty 1

## 2016-01-23 MED ORDER — LORAZEPAM 1 MG PO TABS: 1.0000 mg | ORAL_TABLET | Freq: Three times a day (TID) | ORAL | 0 refills | Status: DC | PRN

## 2016-01-23 MED ORDER — SODIUM CHLORIDE 0.9 % IV SOLN
1000.0000 mL | INTRAVENOUS | Status: DC
  Administered 2016-01-23: 1000 mL via INTRAVENOUS

## 2016-01-23 MED ORDER — OXYCODONE-ACETAMINOPHEN 5-325 MG PO TABS
1.0000 | ORAL_TABLET | Freq: Once | ORAL | Status: AC
  Administered 2016-01-23: 1 via ORAL

## 2016-01-23 NOTE — ED Triage Notes (Signed)
Pt reports headache since 1900 last night. Pt denies n/v/d, fever at home. Pt has not taken any medicine for her pain.

## 2016-01-23 NOTE — ED Provider Notes (Signed)
[facility] DEPT Provider Note   CSN: [id number] Arrival date & time: 01/23/16  0034   By signing my name below, I, Neta Mends, attest that this documentation has been prepared under the direction and in the presence of Delora Fuel, MD . Electronically Signed: Neta Mends, ED Scribe. 01/23/2016. 2:08 AM.   History   Chief Complaint Chief Complaint  Patient presents with  . Headache    The history is provided by the patient and a relative. The history is limited by a language barrier. No language interpreter was used.   HPI Comments:  Tina Johnson is a 50 y.o. female who presents to the Emergency Department complaining of a constant headache x3 days. Pt complains of associated blurred vision. Pt states that she was recently diagnosed with HTN. No alleviating factors noted. Pt denies nausea, vomiting, diarrhea, fever.   Past Medical History:  Diagnosis Date  . Anxiety    controls with exercise   . Arthritis    knees , shoulders  . Indigestion    no medicine at present    Patient Active Problem List   Diagnosis Date Noted  . Other bilateral secondary osteoarthritis of knee 04/03/2015  . Abnormality of gait 04/26/2014  . 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 Family history of diabetes mellitus (DM) 01/23/2014  . Right knee DJD 01/23/2014  . Hyperlipidemia 01/23/2014  . HYPERLIPIDEMIA 08/14/2007  . DEPRESSION 08/14/2007  . GERD 08/14/2007  . OSTEOARTHRITIS 08/14/2007  . HEADACHE, CHRONIC 08/14/2007    Past Surgical History:  Procedure Laterality Date  . ABDOMINAL HYSTERECTOMY    . TOTAL ABDOMINAL HYSTERECTOMY W/ BILATERAL SALPINGOOPHORECTOMY    . TOTAL KNEE ARTHROPLASTY Right 04/03/2015   Procedure: RIGHT TOTAL KNEE ARTHROPLASTY;  Surgeon: Leandrew Koyanagi, MD;  Location: [facility];  Service: Orthopedics;  Laterality: Right;  . TOTAL KNEE ARTHROPLASTY Left 04/03/2015   Procedure: LEFT TOTAL KNEE  ARTHROPLASTY;  Surgeon: Leandrew Koyanagi, MD;  Location: [facility];  Service: Orthopedics;  Laterality: Left;  Marland Kitchen VAGINAL DELIVERY     ?x2    OB History    No data available       Home Medications    Prior to Admission medications   Medication Sig Start Date End Date Taking? Authorizing Provider  conjugated estrogens (PREMARIN) vaginal cream Place 1 Applicatorful vaginally 2 (two) times a week.    Historical Provider, MD  estradiol (CLIMARA - DOSED IN MG/24 HR) 0.1 mg/24hr patch Place 0.1 mg onto the skin once a week.    Historical Provider, MD  estrogens, conjugated, (PREMARIN) 0.625 MG tablet Take 0.625 mg by mouth daily. Take daily for 21 days then do not take for 7 days.    Historical Provider, MD  Ferrous Gluconate (IRON) 240 (27 Fe) MG TABS Take by mouth.    Historical Provider, MD  Omega 3 1000 MG CAPS Take 1,000 mg by mouth daily.    Historical Provider, MD  OVER THE COUNTER MEDICATION Take 1 tablet by mouth daily. Herbalife multivitamin    Historical Provider, MD  OVER THE COUNTER MEDICATION Take 1 Dose by mouth daily. Herbalife protein shake    Historical Provider, MD  senna-docusate (SENOKOT S) 8.6-50 MG tablet Take 1 tablet by mouth at bedtime as needed. Patient not taking: Reported on 01/21/2016 04/06/15   Leandrew Koyanagi, MD  UNABLE TO FIND Testosterone 4% PLO  Apply peasize amount to inner thigh 2 times a week Patient  not taking: Reported on 01/21/2016 01/23/14   Lorayne Marek, MD    Family History Family History  Problem Relation Age of Onset  . Diabetes Sister   . Diabetes Brother     Social History Social History  Substance Use Topics  . Smoking status: Never Smoker  . Smokeless tobacco: Never Used  . Alcohol use No     Allergies   Latex   Review of Systems Review of Systems  Constitutional: Negative for fever.  Eyes: Positive for visual disturbance.  Gastrointestinal: Negative for diarrhea, nausea and vomiting.  Neurological: Positive for headaches.  All  other systems reviewed and are negative.    Physical Exam Updated Vital Signs BP 135/64 (BP Location: Right Arm)   Pulse (!) 56   Temp 97.8 F (36.6 C) (Oral)   Resp 18   SpO2 100%   Physical Exam  Constitutional: She is oriented to person, place, and time. She appears well-developed and well-nourished.  HENT:  Head: Normocephalic and atraumatic.  Eyes: EOM are normal. Pupils are equal, round, and reactive to light.  Fundi normal  Neck: Normal range of motion. Neck supple. No JVD present.  Cardiovascular: Normal rate, regular rhythm and normal heart sounds.   No murmur heard. Pulmonary/Chest: Effort normal and breath sounds normal. She has no wheezes. She has no rales. She exhibits no tenderness.  Abdominal: Soft. Bowel sounds are normal. She exhibits no distension and no mass. There is no tenderness.  Musculoskeletal: Normal range of motion. She exhibits no edema.  Lymphadenopathy:    She has no cervical adenopathy.  Neurological: She is alert and oriented to person, place, and time. No cranial nerve deficit. She exhibits normal muscle tone. Coordination normal.  Skin: Skin is warm and dry. No rash noted.  Psychiatric: She has a normal mood and affect. Her behavior is normal. Judgment and thought content normal.  Nursing note and vitals reviewed.    ED Treatments / Results  DIAGNOSTIC STUDIES:  Oxygen Saturation is 100% on RA, normal by my interpretation.    COORDINATION OF CARE:  2:08 AM Discussed treatment plan with pt at bedside and pt agreed to plan.   Labs (all labs ordered are listed, but only abnormal results are displayed) Labs Reviewed  COMPREHENSIVE METABOLIC PANEL - Abnormal; Notable for the following:       Result Value   Chloride 99 (*)    Glucose, Bld 105 (*)    All other components within normal limits  URINALYSIS, ROUTINE W REFLEX MICROSCOPIC (NOT AT [facility]) - Abnormal; Notable for the following:    Hgb urine dipstick SMALL (*)    All other  components within normal limits  URINE MICROSCOPIC-ADD ON - Abnormal; Notable for the following:    Squamous Epithelial / LPF 0-5 (*)    Bacteria, UA RARE (*)    All other components within normal limits  CBC WITH DIFFERENTIAL/PLATELET    EKG  EKG Interpretation None       Radiology No results found.  Procedures Procedures (including critical care time)  Medications Ordered in ED Medications  oxyCODONE-acetaminophen (PERCOCET/ROXICET) 5-325 MG per tablet (not administered)  0.9 %  sodium chloride infusion (not administered)    Followed by  0.9 %  sodium chloride infusion (not administered)  metoCLOPramide (REGLAN) injection 10 mg (not administered)  diphenhydrAMINE (BENADRYL) injection 25 mg (not administered)  dexamethasone (DECADRON) injection 10 mg (not administered)  LORazepam (ATIVAN) injection 1 mg (not administered)  oxyCODONE-acetaminophen (PERCOCET/ROXICET) 5-325 MG per tablet 1  tablet (1 tablet Oral Given 01/23/16 0050)     Initial Impression / Assessment and Plan / ED Course  I have reviewed the triage vital signs and the nursing notes.  Pertinent labs & imaging results that were available during my care of the patient were reviewed by me and considered in my medical decision making (see chart for details).  Clinical Course   Headache which is nonspecific. No typical findings of migraine and no tenderness to suggest muscle contraction headache. Also, generalized anxiety. She's given a headache cocktail of normal saline, metoclopramide, diphenhydramine, dexamethasone. She is also given a dose of lorazepam. Following this, she had excellent relief of symptoms. She is discharged with prescription for metoclopramide and lorazepam and given outpatient counseling resources. Old records were reviewed, and she has no relevant past visits.  Final Clinical Impressions(s) / ED Diagnoses   Final diagnoses:  Headache, unspecified headache type  Anxiety    New  Prescriptions New Prescriptions   No medications on file  I personally performed the services described in this documentation, which was scribed in my presence. The recorded information has been reviewed and is accurate.       Delora Fuel, MD [id number]

## 2016-01-28 ENCOUNTER — Telehealth: Payer: Self-pay

## 2016-01-28 NOTE — Telephone Encounter (Signed)
[place] [id number] Contacted pt to go over lab results pt is aware of results and doesn't have any questions or concerns

## 2016-02-09 ENCOUNTER — Telehealth: Payer: Self-pay | Admitting: Internal Medicine

## 2016-02-09 NOTE — Telephone Encounter (Signed)
Pt. Called requesting to be referred out to the opthalmology .  Please f/u

## 2016-02-09 NOTE — Telephone Encounter (Signed)
Could you find out why patient is needing referral thanks

## 2016-02-26 ENCOUNTER — Encounter (INDEPENDENT_AMBULATORY_CARE_PROVIDER_SITE_OTHER): Payer: Self-pay | Admitting: Orthopaedic Surgery

## 2016-02-26 ENCOUNTER — Ambulatory Visit (INDEPENDENT_AMBULATORY_CARE_PROVIDER_SITE_OTHER): Payer: Self-pay | Admitting: Orthopaedic Surgery

## 2016-02-26 ENCOUNTER — Ambulatory Visit (INDEPENDENT_AMBULATORY_CARE_PROVIDER_SITE_OTHER): Payer: Self-pay

## 2016-02-26 DIAGNOSIS — M1712 Unilateral primary osteoarthritis, left knee: Secondary | ICD-10-CM

## 2016-02-26 DIAGNOSIS — M1711 Unilateral primary osteoarthritis, right knee: Secondary | ICD-10-CM

## 2016-02-26 MED ORDER — AMOXICILLIN 500 MG PO CAPS: 2000.0000 mg | ORAL_CAPSULE | Freq: Once | ORAL | 0 refills | Status: AC

## 2016-02-26 NOTE — Addendum Note (Signed)
Addended by: Azucena Cecil on: 02/26/2016 03:58 PM   Modules accepted: Orders

## 2016-02-26 NOTE — Progress Notes (Addendum)
Office Visit Note   Patient: Tina Johnson           Date of Birth: 1966-03-10           MRN: [id number] Visit Date: 02/26/2016              Requested by: Maren Reamer, MD [address] [place], [place] [address] PCP: Maren Reamer, MD   Assessment & Plan: Visit Diagnoses:  1. Primary osteoarthritis of right knee   2. Primary osteoarthritis of left knee     Plan:  The patient has done very well from bilateral knee replacements. I feel like she has finally fully recovered from her surgery. She is very cheerful today. Today's encounter was made more complex due to the language barrier. At this point I will see her back in 1 year for her 2 year visit. Dental prophylaxis was reinforced.She'll need repeat two-view x-rays of bilateral knees.  Follow-Up Instructions: Return in about 1 year (around 02/25/2017) for Recheck bilateral total knee replacements.   Orders:  Orders Placed This Encounter  Procedures  . XR Knee 1-2 Views Left  . XR Knee 1-2 Views Right   Meds ordered this encounter  Medications  . amoxicillin (AMOXIL) 500 MG capsule    Sig: Take 4 capsules (2,000 mg total) by mouth once. Take 30 minutes before minor procedure    Dispense:  1 capsule    Refill:  0      Procedures: No procedures performed   Clinical Data: No additional findings.   Subjective: Chief Complaint  Patient presents with  . Right Knee - Pain, Routine Post Op, Follow-up  . Left Knee - Pain, Follow-up, Routine Post Op    Patient is approximately one year status post bilateral total knee replacement. She is doing well. She is very happy with the result. She denies any pain. He does endorse some achiness with weather changes. She is ambulating without any assistive devices now. She is still doing physical therapy at home on her own.    Review of Systems   Objective: Vital Signs: There were no vitals taken for this visit.  Physical Exam  Constitutional: She is  oriented to person, place, and time. She appears well-developed and well-nourished.  HENT:  Head: Atraumatic.  Eyes: EOM are normal.  Neck: Neck supple.  Cardiovascular: Intact distal pulses.   Pulmonary/Chest: Effort normal.  Abdominal: Soft.  Neurological: She is alert and oriented to person, place, and time.  Skin: Skin is warm. Capillary refill takes less than 2 seconds.  Psychiatric: She has a normal mood and affect. Her behavior is normal. Judgment and thought content normal.  Nursing note and vitals reviewed.   Right Knee Exam   Tenderness  The patient is experiencing no tenderness.     Range of Motion  Extension: 0  Flexion: 90   Muscle Strength   The patient has normal right knee strength.  Comments:  Well-healed surgical scars.   Left Knee Exam   Tenderness  The patient is experiencing no tenderness.     Range of Motion  Extension: 0  Flexion: 90   Muscle Strength   The patient has normal left knee strength.  Comments:  Well-healed surgical scars.      Specialty Comments:  No specialty comments available.  Imaging: Xr Knee 1-2 Views Left  Result Date: 02/26/2016 Stable total knee replacement  Xr Knee 1-2 Views Right  Result Date: 02/26/2016 Stable total knee replacement  PMFS History: Patient Active Problem List   Diagnosis Date Noted  . Other bilateral secondary osteoarthritis of knee 04/03/2015  . Abnormality of gait 04/26/2014  . 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 Family history of diabetes mellitus (DM) 01/23/2014  . Right knee DJD 01/23/2014  . Hyperlipidemia 01/23/2014  . HYPERLIPIDEMIA 08/14/2007  . DEPRESSION 08/14/2007  . GERD 08/14/2007  . OSTEOARTHRITIS 08/14/2007  . HEADACHE, CHRONIC 08/14/2007   Past Medical History:  Diagnosis Date  . Anxiety    controls with exercise   . Arthritis    knees , shoulders  . Indigestion    no medicine at present    Family  History  Problem Relation Age of Onset  . Diabetes Sister   . Diabetes Brother     Past Surgical History:  Procedure Laterality Date  . ABDOMINAL HYSTERECTOMY    . TOTAL ABDOMINAL HYSTERECTOMY W/ BILATERAL SALPINGOOPHORECTOMY    . TOTAL KNEE ARTHROPLASTY Right 04/03/2015   Procedure: RIGHT TOTAL KNEE ARTHROPLASTY;  Surgeon: Leandrew Koyanagi, MD;  Location: [facility];  Service: Orthopedics;  Laterality: Right;  . TOTAL KNEE ARTHROPLASTY Left 04/03/2015   Procedure: LEFT TOTAL KNEE ARTHROPLASTY;  Surgeon: Leandrew Koyanagi, MD;  Location: [facility];  Service: Orthopedics;  Laterality: Left;  Marland Kitchen VAGINAL DELIVERY     ?x2   Social History   Occupational History  . Not on file.   Social History Main Topics  . Smoking status: Never Smoker  . Smokeless tobacco: Never Used  . Alcohol use No  . Drug use: No  . Sexual activity: Not on file

## 2016-04-05 ENCOUNTER — Ambulatory Visit: Payer: Self-pay | Attending: Internal Medicine | Admitting: Internal Medicine

## 2016-04-05 ENCOUNTER — Encounter: Payer: Self-pay | Admitting: Internal Medicine

## 2016-04-05 VITALS — BP 136/78 | HR 56 | Temp 98.1°F | Resp 16 | Wt 112.2 lb

## 2016-04-05 DIAGNOSIS — I159 Secondary hypertension, unspecified: Secondary | ICD-10-CM

## 2016-04-05 DIAGNOSIS — Z1231 Encounter for screening mammogram for malignant neoplasm of breast: Secondary | ICD-10-CM

## 2016-04-05 DIAGNOSIS — Z1239 Encounter for other screening for malignant neoplasm of breast: Secondary | ICD-10-CM

## 2016-04-05 DIAGNOSIS — Z91040 Latex allergy status: Secondary | ICD-10-CM | POA: Insufficient documentation

## 2016-04-05 DIAGNOSIS — I10 Essential (primary) hypertension: Secondary | ICD-10-CM | POA: Insufficient documentation

## 2016-04-05 DIAGNOSIS — Z23 Encounter for immunization: Secondary | ICD-10-CM

## 2016-04-05 DIAGNOSIS — H538 Other visual disturbances: Secondary | ICD-10-CM

## 2016-04-05 DIAGNOSIS — E785 Hyperlipidemia, unspecified: Secondary | ICD-10-CM

## 2016-04-05 DIAGNOSIS — M1990 Unspecified osteoarthritis, unspecified site: Secondary | ICD-10-CM | POA: Insufficient documentation

## 2016-04-05 MED ORDER — HYDROCHLOROTHIAZIDE 12.5 MG PO TABS: 12.5000 mg | ORAL_TABLET | Freq: Every day | ORAL | 3 refills | Status: DC

## 2016-04-05 MED ORDER — PRAVASTATIN SODIUM 20 MG PO TABS: 20.0000 mg | ORAL_TABLET | Freq: Every day | ORAL | 3 refills | Status: DC

## 2016-04-05 NOTE — Progress Notes (Signed)
Tina Johnson, is a 50 y.o. female  [id number]  [id number]  DOB - 03/26/1966  Chief Complaint  Patient presents with  . Hypertension        Subjective:   Tina Johnson is a 50 y.o. female here today for a follow up visit., last seen 10/4.  Doing much better overall, denies headaches and her mood has improved greatly now that she is more active/exercising/and getting out of her house more.  She was seen by ortho 02/26/16 and has been medically cleared to resume all activities, return in 1 year.    Patient has No headache, No chest pain, No abdominal pain - No Nausea, No new weakness tingling or numbness, No Cough - SOB.  Interpreter was used to communicate directly with patient for the entire encounter including providing detailed patient instructions.   No problems updated.  ALLERGIES: Allergies  Allergen Reactions  . Latex Rash    PAST MEDICAL HISTORY: Past Medical History:  Diagnosis Date  . Anxiety    controls with exercise   . Arthritis    knees , shoulders  . Indigestion    no medicine at present    MEDICATIONS AT HOME: Prior to Admission medications   Medication Sig Start Date End Date Taking? Authorizing Provider  hydrochlorothiazide (HYDRODIURIL) 12.5 MG tablet Take 1 tablet (12.5 mg total) by mouth daily. 04/05/16   Maren Reamer, MD  pravastatin (PRAVACHOL) 20 MG tablet Take 1 tablet (20 mg total) by mouth daily. 04/05/16   Maren Reamer, MD     Objective:   Vitals:   04/05/16 1007  BP: 136/78  Pulse: (!) 56  Resp: 16  Temp: 98.1 F (36.7 C)  TempSrc: Oral  SpO2: 99%  Weight: 112 lb 3.2 oz (50.9 kg)    Exam General appearance : Awake, alert, not in any distress. Speech Clear. Not toxic looking, pleasant. HEENT: Atraumatic and Normocephalic, pupils equally reactive to light. Neck: supple, no JVD. No cervical lymphadenopathy.  Chest:Good air entry bilaterally, no added sounds. CVS: S1 S2 regular, no  murmurs/gallups or rubs. Abdomen: Bowel sounds active, Non tender and not distended with no gaurding, rigidity or rebound. Extremities: B/L Lower Ext shows no edema, both legs are warm to touch Neurology: Awake alert, and oriented X 3, CN II-XII grossly intact, Non focal Skin:No Rash  Data Review Lab Results  Component Value Date   HGBA1C 5.0 01/21/2016   HGBA1C 5.3 01/29/2014    Depression screen [facility] 2/9 01/21/2016 02/13/2015 05/23/2014 03/07/2014  Decreased Interest 0 0 0 0  Down, Depressed, Hopeless 0 0 0 0  PHQ - 2 Score 0 0 0 0      Assessment & Plan   1. Hyperlipidemia, unspecified hyperlipidemia type dw pt diet/exercise Pravastatin 20 qhs started ascvd risk <1.78%, would not recd asa at this time.  2. Blurry vision, bilateral Suspect presbyopia per her description, recd getting reader glasses, fairly inexpensive. - Ambulatory referral to Ophthalmology  - uninsured, may not be able to see, has [facility] card  3. Breast cancer screening Mm screen ordered.  4. Htn, elevated after numerous visits dw low salt diet/exercise Will start hctz 12.5 qd for now.   5. tdap today  Patient have been counseled extensively about nutrition and exercise  Return in about 3 months (around 07/04/2016).  The patient was given clear instructions to go to ER or return to medical center if symptoms don't improve, worsen or new problems develop. The patient verbalized understanding. The patient was  told to call to get lab results if they haven't heard anything in the next week.   This note has been created with Surveyor, quantity. Any transcriptional errors are unintentional.   Maren Reamer, MD, [facility] [place], [place]   04/05/2016, 10:43 AM

## 2016-04-05 NOTE — Patient Instructions (Addendum)
Normal sbp (top number) 110- 130 -=   Plan de alimentacin con bajo contenido de sodio (Low-Sodium Eating Plan) El sodio aumenta la presin arterial y hace que el cuerpo retenga lquidos. El consumo de alimentos con menos sodio ayuda a Armed forces technical officer presin arterial, a Forensic psychologist y a Tour manager, el hgado y los riones. Agregar sal (cloruro de sodio) a los alimentos aumenta el aporte de [place]. La mayor parte del sodio proviene de los alimentos enlatados, envasados y congelados. La pizza, la comida rpida y la comida de los restaurantes tambin contienen mucho sodio. Aunque usted tome medicamentos para bajar la presin arterial o reducir el lquido del cuerpo, es importante que disminuya el aporte de sodio de los alimentos. EN QU CONSISTE EL PLAN? La [employer] de las personas deberan limitar la ingesta de sodio a 2300mg  por Training and development officer. El mdico le recomienda que limite su consumo de sodio a 2,000mg   por Training and development officer. QU DEBO SABER ACERCA DE ESTE PLAN DE [place]? Para el plan de alimentacin con bajo contenido de sodio, debe seguir estas pautas generales:  Elija alimentos con un valor porcentual diario de sodio de menos del 5% (segn se indica en la etiqueta).  Use hierbas o aderezos sin sal, en lugar de sal de mesa o sal marina.  Consulte al mdico o farmacutico antes de usar sustitutos de la sal.  Coma alimentos frescos.  Coma ms frutas y verduras.  Limite las verduras enlatadas. Si las consume, enjuguelas bien para disminuir el sodio.  Limite el consumo de queso a 1onza (28g) por Training and development officer.  Coma productos con bajo contenido de sodio, cuya etiqueta suele decir "bajo contenido de sodio" o "sin agregado de sal".  Evite alimentos que contengan glutamato monosdico (MSG), que a veces se agrega a la comida [place] y a algunos alimentos enlatados.  Consulte las etiquetas de los alimentos (etiquetas de informacin nutricional) para saber cunto sodio contiene una porcin.  Consuma  ms comida casera y menos de restaurante, de buf y comida rpida.  Cuando coma en un restaurante, pida que preparen su comida con menos sal o, en lo posible, sin nada de sal. CMO LEO LA INFORMACIN SOBRE EL SODIO EN LAS ETIQUETAS DE LOS ALIMENTOS? La etiqueta de informacin nutricional indica la cantidad de sodio en una porcin de alimento. Si come ms de una porcin, debe multiplicar la cantidad indicada de sodio por la cantidad de porciones. Las etiquetas de los alimentos tambin pueden indicar lo siguiente:  Sin sodio: menos de 5mg  por porcin.  Cantidad muy baja de sodio: 35mg  o menos por porcin.  Cantidad baja de sodio: 140mg  o menos por porcin.  Menor cantidad de sodio: 50% menos de sodio en una porcin. Por ejemplo, si un alimento generalmente contiene 300 mg de sodio se modifica para ser [employer], tendr 150 mg de sodio.  Sodio reducido: 25% menos de Agricultural consultant. Por ejemplo, si un alimento que por lo general contiene 400mg  de sodio se modifica para convertirse en un alimento de sodio reducido, tendr 300mg  de sodio. QU ALIMENTOS PUEDO COMER? Cereales  Cereales con bajo contenido de sodio, como [place], arroz y trigo [place], y trigo triturado. Galletas con bajo contenido de [place]. Arroz y pastas sin sal. Pan con bajo contenido de [place]. Verduras  Verduras frescas o congeladas. Verduras enlatadas con bajo contenido de sodio o reducido de sodio. Pasta y salsa de tomate con contenido bajo o [place]. Jugos de tomate y verduras con [employer]  bajo o reducido de sodio. Lambert Mody  Frutas frescas, congeladas y IT sales professional. Jugo de frutas. Carnes y otros productos con protenas  Atn y salmn enlatado con bajo contenido de [address]. Carne de vaca o ave, pescado y frutos de mar frescos o congelados. Cordero. Frutos secos sin sal. Lentejas, frijoles y guisantes secos, sin sal agregada. Frijoles enlatados sin sal. Sopas caseras sin sal. Huevos. Lcteos    Leche. Leche de soja. Queso ricota. Quesos con contenido bajo o reducido de sodio. Yogur. Condimentos  Hierbas y especias frescas y secas. Aderezos sin sal. [place] y ajo en polvo. Variedades de [place] y ketchup con bajo contenido de sodio. Rbano picante fresco o refrigerado. Jugo de limn. Grasas y aceites  Aderezos para ensalada con contenido reducido de [place]. Mantequilla sin sal. Otros  Palomitas de maz y pretzels sin sal. Los artculos mencionados arriba pueden no ser [employer] de las bebidas o los alimentos recomendados. Comunquese con el nutricionista para conocer ms opciones.  QU ALIMENTOS NO SE RECOMIENDAN? Cereales  Cereales instantneos para comer caliente. Mezclas para bizcochos, panqueques y rellenos de pan. Crutones. Mezclas para pastas o arroz con condimento. Envases comerciales de sopa de fideos. Macarrones con queso envasados o congelados. Harina leudante. Galletas saladas comunes. Verduras  Verduras enlatadas comunes. Pasta y salsa de tomate en lata comunes. Jugos comunes de tomate y de verduras. Verduras Surveyor, minerals. Papas fritas saladas. Aceitunas. Pepinillos. Salsas. Chucrut. Salsa. Carnes y otros productos con protenas  Carne de vaca, pescado o frutos de mar que est salada, [place], [place], condimentada con especias o con pickles. Panceta, jamn, salchichas, perros calientes, carne curada, carne picada (carne envasada de buey) y embutidos. Cerdo salado. Cecina o charqui. Arenque en escabeche. Anchoas, atn enlatado comn y sardinas. Frutos secos con sal. Ines Bloomer para untar y quesos procesados. Requesn. Queso azul y cottage. Suero de [place]. Condimentos  Sal de cebolla y ajo, sal condimentada, sal de mesa y sal marina. Salsas en lata y envasadas. Salsa [place]. Salsa trtara. Salsa barbacoa. Salsa teriyaki. Salsa de soja, incluso la que tiene contenido reducido de [place]. Salsa de carne. Salsa de pescado. Salsa de [place]. Salsa  rosada. Rbano picante envasado. Ketchup y mostaza comunes. Saborizantes y tiernizantes para carne. Caldo en cubitos. Salsa picante. Salsa tabasco. Adobos. Aderezos para tacos. Salsas. Grasas y aceites  Aderezos comunes para ensalada. Hoopa con sal. Margarina. Mantequilla clarificada. Grasa de panceta. Otros  Nachos y papas fritas envasadas. Maz inflado y frituras de maz. Palomitas de maz y pretzels con sal. Sopas enlatadas o en polvo. Pizza. Pasteles y entradas congeladas. Los artculos mencionados arriba pueden no ser [employer] de las bebidas y los alimentos que se Higher education careers adviser. Comunquese con el nutricionista para obtener ms informacin.  Esta informacin no tiene Marine scientist el consejo del mdico. Asegrese de hacerle al mdico cualquier pregunta que tenga. Document Released: 04/05/2005 Document Revised: 04/26/2014 Document Reviewed: 02/07/2013 Elsevier Interactive Patient Education  2017 [place].  -  Hipertensin (Hypertension) El trmino hipertensin es otra forma de denominar a la presin arterial elevada. La presin arterial elevada fuerza al corazn a trabajar ms para bombear la sangre. Una lectura de la presin arterial consta de dos nmeros: uno ms alto sobre uno ms bajo (por ejemplo, 110/72). CUIDADOS EN EL HOGAR  Haga que el mdico le tome nuevamente la presin arterial.  Tome los medicamentos solamente como se lo haya indicado el mdico. Siga cuidadosamente las indicaciones. Los medicamentos pierden eficacia si omite dosis. El  hecho de omitir las dosis tambin [employer] riesgo de otros [place].  No fume.  Contrlese la presin arterial en su casa como se lo haya indicado el mdico. SOLICITE AYUDA SI:  Piensa que tiene una reaccin a los medicamentos que est tomando.  Tiene mareos o dolores de cabeza reiterados.  Se le inflaman (hinchan) los tobillos.  Tiene problemas de visin. SOLICITE AYUDA DE INMEDIATO SI:  Tiene un dolor de  cabeza muy intenso y est confundido.  Se siente dbil, aturdido o se desmaya.  Tiene dolor en el pecho o el estmago (abdominal).  Tiene vmitos.  No puede respirar [employer]. ASEGRESE DE QUE:  Comprende estas instrucciones.  Controlar su afeccin.  Recibir ayuda de inmediato si no mejora o si empeora. Esta informacin no tiene Marine scientist el consejo del mdico. Asegrese de hacerle al mdico cualquier pregunta que tenga. Document Released: 09/23/2009 Document Revised: 04/10/2013 Document Reviewed: 01/26/2013 Elsevier Interactive Patient Education  2017 [place] (Cholesterol) El colesterol esuna grasa. El organismo necesita una pequea cantidad de colesterol. El colesterol puede acumularse en los vasos sanguneos, lo que aumenta la probabilidad de tener un ataque cardaco o un ictus. Los niveles de colesterol no pueden percibirse. La nica forma de saber que el colesterol est alto es mediante un anlisis de [place]. Guarde los [employer]. Trabaje con el mdico para Investment banker, corporate en un nivel adecuado. [place] LOS RESULTADOS DEL ANLISIS?  El colesterol total es la cantidad de colesterol que hay en la [place].  El LDL es el colesterol [place], y es el tipo que puede acumularse. Lo deseable es mantenerlo bajo.  El HDL es el colesterol bueno. Limpia los vasos sanguneos y elimina el colesterol LDL. Lo deseable es que el colesterol HDL sea alto.  Los triglicridos son grasas que el organismo puede [place], ya sea para energa o para almacenamiento. CULES SON LOS NIVELES BUENOS DE COLESTEROL?  El colesterol total por debajo de 200.  El LDL por debajo de 100 en las personas que corren riesgo. Por debajo de 41 en las personas que corren un riesgo muy alto.  El HDL por encima de 50 es aceptable. Por encima de 60 es mejor.  Los triglicridos por debajo de 150. [place]?  Dieta. Siga los programas  de alimentacin que el mdico le indique.  Elija el pescado, la carne blanca de pollo, el pavo asado u horneado. Intente no comer carnes rojas, alimentos fritos o carnes procesadas, como salchichas y embutidos.  Coma gran cantidad de frutas y verduras frescas.  Elija los cereales integrales, los frijoles, las pastas, las papas y los cereales.  Use solamente pequeas cantidades de aceite de oliva, maz o canola.  Intente no comer [place], [place], [place] o aceites de [place].  Intente no comer alimentos que contengan grasas trans.  Beba leche descremada o sin grasa. Coma yogur y quesos bajos en Wendee Copp o sin grasa. Intente no consumir leche entera o crema. Intente no comer helados, yemas de huevo y quesos enteros.  Los postres sanos incluyen la torta ngel, los bocadillos de [place], las Administrator con forma de [place], los caramelos duros, los helados de agua y el yogur bajo en grasa o sin grasa. Intente no comer masas, tortas, pasteles y galletas.  Haga actividad fsica. Siga el programa de actividad fsica segn las indicaciones del mdico.  Sea ms [place]. Puede intentar hacer jardinera, hacer caminatas o subir las escaleras. Consulte al mdico  cmo puede aumentar la [place].  Medicamentos. Tome los [employer] como le indic su mdico. Esta informacin no tiene Marine scientist el consejo del mdico. Asegrese de hacerle al mdico cualquier pregunta que tenga. Document Released: 05/08/2010 Document Revised: 04/26/2014 Elsevier Interactive Patient Education  2017 [place] (Tetanus and Diphtheria): What You Need to Know 1. Why get vaccinated? Tetanus  and diphtheria are very serious diseases. They are rare in the [place] today, but people who do become infected often have severe complications. Td vaccine is used to protect adolescents and adults from both of these diseases. Both tetanus and diphtheria are infections caused by bacteria.  Diphtheria spreads from person to person through coughing or sneezing. Tetanus-causing bacteria enter the body through cuts, scratches, or wounds. TETANUS (lockjaw) causes painful muscle tightening and stiffness, usually all over the body.  It can lead to tightening of muscles in the head and neck so you can't open your mouth, swallow, or sometimes even breathe. Tetanus kills about 1 out of every 10 people who are infected even after receiving the best medical care. DIPHTHERIA can cause a thick coating to form in the back of the throat.  It can lead to breathing problems, paralysis, heart failure, and death. Before vaccines, as many as 200,000 cases of diphtheria and hundreds of cases of tetanus were reported in the [place] each year. Since vaccination began, reports of cases for both diseases have dropped by about 99%. 2. Td vaccine Td vaccine can protect adolescents and adults from tetanus and diphtheria. Td is usually given as a booster dose every 10 years but it can also be given earlier after a severe and dirty wound or burn. Another vaccine, called Tdap, which protects against pertussis in addition to tetanus and diphtheria, is sometimes recommended instead of Td vaccine. Your doctor or the person giving you the vaccine can give you more information. Td may safely be given at the same time as other vaccines. 3. Some people should not get this vaccine  A person who has ever had a life-threatening allergic reaction after a previous dose of any tetanus or diphtheria containing vaccine, OR has a severe allergy to any part of this vaccine, should not get Td vaccine. Tell the person giving the vaccine about any severe allergies.  Talk to your doctor if you:  had severe pain or swelling after any vaccine containing diphtheria or tetanus,  ever had a condition called Guillain Barre Syndrome (GBS),  aren't feeling well on the day the shot is scheduled. 4. What are the risks from Td  vaccine? With any medicine, including vaccines, there is a chance of side effects. These are usually mild and go away on their own. Serious reactions are also possible but are rare. Most people who get Td vaccine do not have any problems with it. Mild problems following Td vaccine: (Did not interfere with activities)  Pain where the shot was given (about 8 people in 10)  Redness or swelling where the shot was given (about 1 person in 4)  Mild fever (rare)  Headache (about 1 person in 4)  Tiredness (about 1 person in 4) Moderate problems following Td vaccine: (Interfered with activities, but did not require medical attention)  Fever over 102F (rare) Severe problems following Td vaccine: (Unable to perform usual activities; required medical attention)  Swelling, severe pain, bleeding and/or redness in the arm where the shot was given (rare). Problems that could happen after any vaccine:  People sometimes faint after a medical procedure, including vaccination. Sitting or lying down for about 15 minutes can help prevent fainting, and injuries caused by a fall. Tell your doctor if you feel dizzy, or have vision changes or ringing in the ears.  Some people get severe pain in the shoulder and have difficulty moving the arm where a shot was given. This happens very rarely.  Any medication can cause a severe allergic reaction. Such reactions from a vaccine are very rare, estimated at fewer than 1 in a million doses, and would happen within a few minutes to a few hours after the vaccination. As with any medicine, there is a very remote chance of a vaccine causing a serious injury or death. The safety of vaccines is always being monitored. For more information, visit: http://www.aguilar.org/ 5. What if there is a serious reaction? What should I look for? Look for anything that concerns you, such as signs of a severe allergic reaction, very high fever, or unusual behavior. Signs of a  severe allergic reaction can include hives, swelling of the face and throat, difficulty breathing, a fast heartbeat, dizziness, and weakness. These would usually start a few minutes to a few hours after the vaccination. What should I do?  If you think it is a severe allergic reaction or other emergency that can't wait, call 9-1-1 or get the person to the nearest hospital. Otherwise, call your doctor.  Afterward, the reaction should be reported to the Vaccine Adverse Event Reporting System (VAERS). Your doctor might file this report, or you can do it yourself through the VAERS web site at www.vaers.SamedayNews.es, or by calling [phone].  VAERS does not give medical advice. 6. The National Vaccine Injury Compensation Program The [employer] Vaccine Injury Compensation Program (VICP) is a federal program that was created to compensate people who may have been injured by certain vaccines. Persons who believe they may have been injured by a vaccine can learn about the program and about filing a claim by calling 919-525-7705 or visiting the [place] website at GoldCloset.com.ee. There is a time limit to file a claim for compensation. 7. How can I learn more?  Ask your doctor. He or she can give you the vaccine package insert or suggest other sources of information.  Call your local or state health department.  Contact the Centers for Disease Control and Prevention (CDC):  Call 251-605-6145 (1-800-CDC-INFO)  Visit CDC's website at http://hunter.com/ CDC Td Vaccine VIS (07/29/15) This information is not intended to replace advice given to you by your health care provider. Make sure you discuss any questions you have with your health care provider. Document Released: 01/31/2006 Document Revised: 12/25/2015 Document Reviewed: 12/25/2015 Elsevier Interactive Patient Education  2017 [employer].

## 2016-04-05 NOTE — Progress Notes (Signed)
Pt is in the office today for a 2 month follow up on blood pressure Pt states the only pain she is having is a sore throat

## 2016-05-14 ENCOUNTER — Ambulatory Visit: Payer: Self-pay | Attending: Internal Medicine

## 2016-05-25 ENCOUNTER — Ambulatory Visit: Payer: Self-pay

## 2016-09-02 ENCOUNTER — Encounter: Payer: Self-pay | Admitting: Internal Medicine

## 2016-09-03 ENCOUNTER — Encounter: Payer: Self-pay | Admitting: Internal Medicine

## 2016-09-06 ENCOUNTER — Encounter: Payer: Self-pay | Admitting: Internal Medicine

## 2016-09-06 ENCOUNTER — Ambulatory Visit: Payer: Self-pay | Attending: Internal Medicine

## 2016-09-17 ENCOUNTER — Ambulatory Visit: Payer: Self-pay | Attending: Internal Medicine

## 2016-10-07 ENCOUNTER — Encounter: Payer: Self-pay | Admitting: Internal Medicine

## 2016-10-07 ENCOUNTER — Ambulatory Visit: Payer: Self-pay | Attending: Internal Medicine | Admitting: Internal Medicine

## 2016-10-07 VITALS — BP 161/80 | HR 52 | Temp 97.8°F | Resp 16 | Wt 116.4 lb

## 2016-10-07 DIAGNOSIS — F329 Major depressive disorder, single episode, unspecified: Secondary | ICD-10-CM | POA: Insufficient documentation

## 2016-10-07 DIAGNOSIS — M170 Bilateral primary osteoarthritis of knee: Secondary | ICD-10-CM | POA: Insufficient documentation

## 2016-10-07 DIAGNOSIS — R300 Dysuria: Secondary | ICD-10-CM | POA: Insufficient documentation

## 2016-10-07 DIAGNOSIS — E785 Hyperlipidemia, unspecified: Secondary | ICD-10-CM | POA: Insufficient documentation

## 2016-10-07 DIAGNOSIS — Z96653 Presence of artificial knee joint, bilateral: Secondary | ICD-10-CM | POA: Insufficient documentation

## 2016-10-07 DIAGNOSIS — I10 Essential (primary) hypertension: Secondary | ICD-10-CM | POA: Insufficient documentation

## 2016-10-07 DIAGNOSIS — K219 Gastro-esophageal reflux disease without esophagitis: Secondary | ICD-10-CM | POA: Insufficient documentation

## 2016-10-07 DIAGNOSIS — Z833 Family history of diabetes mellitus: Secondary | ICD-10-CM | POA: Insufficient documentation

## 2016-10-07 HISTORY — DX: Presence of artificial knee joint, bilateral: Z96.653

## 2016-10-07 NOTE — Patient Instructions (Addendum)
Please give appointment with Tina Johnson Hint to have BP check in 2 weeks.    Hipertensin Hypertension La hipertensin, conocida comnmente como presin arterial alta, se produce cuando la sangre bombea en las arterias con mucha fuerza. Las arterias son los vasos sanguneos que transportan la sangre desde el corazn al resto del cuerpo. La hipertensin hace que el corazn haga ms esfuerzo para Chiropodist y Dana Corporation que las arterias se Teacher, music o Advertising account executive. La hipertensin no tratada o no controlada puede causar infarto de miocardio, accidentes cerebrovasculares, enfermedad renal y otros problemas. Una lectura de la presin arterial consiste de un nmero ms alto sobre un nmero ms bajo. En condiciones ideales, la presin arterial debe estar por debajo de 120/80. El primer nmero ("superior") es la presin sistlica. Es la medida de la presin de las arterias cuando el corazn late. El segundo nmero ("inferior") es la presin diastlica. Es la medida de la presin en las arterias cuando el corazn se relaja. Cules son las causas? Se desconoce la causa de esta afeccin. Qu incrementa el riesgo? Algunos factores de riesgo de hipertensin estn bajo su control. Otros no. Factores que puede CBS Corporation.  Tener diabetes mellitus tipo 2, colesterol alto, o ambos.  No hacer la cantidad suficiente de actividad fsica o ejercicio.  Tener sobrepeso.  Consumir mucha grasa, azcar, caloras o sal (sodio) en su dieta.  Beber alcohol en exceso. Factores que son difciles o imposibles de modificar  Tener enfermedad renal crnica.  Tener antecedentes familiares de presin arterial alta.  La edad. Los riesgos aumentan con la edad.  La raza. El riesgo es mayor para las Retail banker.  El sexo. Antes de los 45aos, los hombres corren ms [employer]. Despus de los 65aos, las mujeres corren ms 3M Company.  Tener apnea obstructiva del  sueo.  El estrs. Cules son los signos o los sntomas? La presin arterial extremadamente alta (crisis hipertensiva) puede provocar:  Dolor de [place].  Ansiedad.  Falta de aire.  Hemorragia nasal.  Nuseas y vmitos.  Dolor de pecho intenso.  Una crisis de movimientos que no puede controlar (convulsiones).  Cmo se diagnostica? Esta afeccin se diagnostica midiendo su presin arterial mientras se encuentra sentado, con el brazo apoyado sobre una superficie. El brazalete del tensimetro debe colocarse directamente sobre la piel de la parte superior del brazo y al nivel de su corazn. Debe medirla al [facility] veces en el mismo brazo. Determinadas condiciones pueden causar una diferencia de presin arterial entre el brazo izquierdo y Insurance underwriter. Ciertos factores pueden provocar que las lecturas de la presin arterial sean inferiores o superiores a lo normal (elevadas) por un perodo corto de tiempo:  Si su presin arterial es ms alta cuando se encuentra en el consultorio del mdico que cuando la mide en su hogar, se denomina "hipertensin de bata blanca". La State Farm de las personas que tienen esta afeccin no deben ser Schering-Plough.  Si su presin arterial es ms alta en el hogar que cuando se encuentra en el consultorio del mdico, se denomina "hipertensin enmascarada". La State Farm de las personas que tienen esta afeccin deben ser medicadas para Chief Technology Officer la presin arterial.  Si tiene una lecturas de presin arterial alta durante una visita o si tiene presin arterial normal con otros factores de riesgo:  Es posible que se le pida que regrese otro da para volver a Chief Technology Officer su presin arterial.  Se le puede pedir que se controle la presin arterial en  su casa durante 1 semana o ms.  Si se le diagnostica hipertensin, es posible que se le realicen otros anlisis de sangre o estudios de diagnstico por imgenes para ayudar a su mdico a comprender su riesgo general de tener otras  afecciones. Cmo se trata? Esta afeccin se trata haciendo cambios saludables en el estilo de vida, tales como ingerir alimentos saludables, realizar ms ejercicio y reducir el consumo de alcohol. El mdico puede recetarle medicamentos si los cambios en el estilo de vida no son suficientes para Child psychotherapist la presin arterial y si:  Su presin arterial sistlica est por encima de 130.  Su presin arterial diastlica est por encima de 80.  La presin arterial deseada puede variar en funcin de las enfermedades, la edad y otros factores personales. Siga estas instrucciones en su casa: Comida y bebida  Siga una dieta con alto contenido de fibras y [place], y con bajo contenido de sodio, Location manager agregada y Physicist, medical. Un ejemplo de plan alimenticio es la dieta DASH (Dietary Approaches to Stop Hypertension, Mtodos alimenticios para detener la hipertensin). Para alimentarse de esta manera: ? Coma mucha fruta y [place]. Trate de que la mitad del plato de cada comida sea de frutas y verduras. ? Coma cereales integrales, como pasta integral, arroz integral y pan integral. Llene aproximadamente un cuarto del plato con cereales integrales. ? Coma y beba productos lcteos con bajo contenido de grasa, como leche descremada o yogur bajo en grasas. ? Evite la ingesta de cortes de carne grasa, carne procesada o curada, y carne de ave con piel. Llene aproximadamente un cuarto del plato con protenas magras, como pescado, pollo sin piel, frijoles, huevos y tofu. ? Evite ingerir alimentos prehechos o procesados. En general, estos tienen mayor cantidad de sodio, azcar agregada y Wendee Copp.  Reduzca su ingesta diaria de sodio. La mayora de las personas que tienen hipertensin deben comer menos de 1500 mg de sodio por Sun[employer].  Limite el consumo de alcohol a no ms de 1 medida por da si es mujer y no est Music therapist y a 2 medidas por da si es hombre. Una medida equivale a 12onzas de cerveza, 5onzas de vino  o 1onzas de bebidas alcohlicas de alta graduacin. Estilo de vida  Trabaje con su mdico para mantener un peso saludable o Administrator, Civil Service. Pregntele cual es su peso recomendado.  Realice al menos 30 minutos de ejercicio que haga que se acelere su corazn (ejercicio Arboriculturist) la Hartford Financial de la [place]. Estas actividades pueden incluir caminar, nadar o andar en bicicleta.  Incluya ejercicios para fortalecer sus msculos (ejercicios de resistencia), como pilates o levantamiento de pesas, como parte de su rutina semanal de ejercicios. Intente realizar 36minutos de este tipo de ejercicios al Solectron Corporation a la [place].  No consuma ningn producto que contenga nicotina o tabaco, como cigarrillos y Psychologist, sport and exercise. Si necesita ayuda para dejar de fumar, consulte al mdico.  Contrlese la presin arterial en su casa segn las indicaciones del mdico.  Concurra a todas las visitas de control como se lo haya indicado el mdico. Esto es importante. Medicamentos  Delphi de venta libre y los recetados solamente como se lo haya indicado el mdico. Siga cuidadosamente las indicaciones. Los medicamentos para la presin arterial deben tomarse segn las indicaciones.  No omita las dosis de medicamentos para la presin arterial. Si lo hace, estar en riesgo de tener problemas y puede hacer que los medicamentos sean menos eficaces.  Pregntele a  su mdico a qu efectos secundarios o reacciones a los Careers information officer. Comunquese con un mdico si:  Piensa que tiene una reaccin a un medicamento que est tomando.  Tiene dolores de cabeza frecuentes (recurrentes).  Siente mareos.  Tiene hinchazn en los tobillos.  Tiene problemas de visin. Solicite ayuda de inmediato si:  Siente un dolor de cabeza intenso o confusin.  Siente debilidad inusual o adormecimiento.  Siente que va a desmayarse.  Siente un dolor intenso en el pecho o el  abdomen.  Vomita repetidas veces.  Tiene dificultad para respirar. Resumen  La hipertensin se produce cuando la sangre bombea en las arterias con mucha fuerza. Si esta afeccin no se controla, podra correr riesgo de tener complicaciones graves.  La presin arterial deseada puede variar en funcin de las enfermedades, la edad y otros factores personales. Para la Comcast, una presin arterial normal es menor que 120/80.  La hipertensin se trata con cambios en el estilo de vida, medicamentos o una combinacin de [place]. Los McDonald's Corporation estilo de vida incluyen prdida de peso, ingerir alimentos sanos, seguir una dieta baja en sodio, hacer ms ejercicio y Environmental consultant consumo de alcohol. Esta informacin no tiene Marine scientist el consejo del mdico. Asegrese de hacerle al mdico cualquier pregunta que tenga. Document Released: 04/05/2005 Document Revised: 03/17/2016 Document Reviewed: 03/17/2016 Elsevier Interactive Patient Education  [employer].

## 2016-10-07 NOTE — Progress Notes (Signed)
Patient ID: Tina Johnson, female    DOB: 19-Feb-1966  MRN: [id number]  CC: re-establish; Hypertension; and Cholesterol check   Subjective: Tina Johnson is a 51 y.o. female who presents for chronic ds management.  Last seen 03/2016 by Dr. Mare Ferrari Her concerns today include:  Pt with hx of HL, OA knee, HTN  1.  Concern about her BP today -checks BP home 2 x a mth. SBP usually in 130s.  Saw GYN 8 days ago and SBP then was 132. -tries to stay active -limits salt in foods -goes to gym 2 days a wk and does 30 mins cardio and also has 2 days of P.T Had BL TKR 03/2015 -given HCTZ but not taking because she was hoping to control BP with diet and exercise  2. HL -compliant with Pravachol  3.  C/O burning with urination x 2 wks.  Associated with frequency -no abdominal pain -no fever  4.  Would like to continue having some P.T on knees.  -she finished recommended P.T in 2017 after she had surgery 03/2015. But she would like to continue P.T for at least 2 yrs but would like for it to be covered.  Patient Active Problem List   Diagnosis Date Noted  . Other bilateral secondary osteoarthritis of knee 04/03/2015  . Abnormality of gait 04/26/2014  . 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 Family history of diabetes mellitus (DM) 01/23/2014  . Right knee DJD 01/23/2014  . Hyperlipidemia 01/23/2014  . HYPERLIPIDEMIA 08/14/2007  . DEPRESSION 08/14/2007  . GERD 08/14/2007  . OSTEOARTHRITIS 08/14/2007  . HEADACHE, CHRONIC 08/14/2007     Current Outpatient Prescriptions on File Prior to Visit  Medication Sig Dispense Refill  . hydrochlorothiazide (HYDRODIURIL) 12.5 MG tablet Take 1 tablet (12.5 mg total) by mouth daily. 90 tablet 3  . pravastatin (PRAVACHOL) 20 MG tablet Take 1 tablet (20 mg total) by mouth daily. 90 tablet 3   No current facility-administered medications on file prior to visit.     Allergies  Allergen  Reactions  . Latex Rash    Social History   Social History  . Marital status: Married    Spouse name: N/A  . Number of children: N/A  . Years of education: N/A   Occupational History  . Not on file.   Social History Main Topics  . Smoking status: Never Smoker  . Smokeless tobacco: Never Used  . Alcohol use No  . Drug use: No  . Sexual activity: Not on file   Other Topics Concern  . Not on file   Social History Narrative  . No narrative on file    Family History  Problem Relation Age of Onset  . Diabetes Sister   . Diabetes Brother     Past Surgical History:  Procedure Laterality Date  . ABDOMINAL HYSTERECTOMY    . TOTAL ABDOMINAL HYSTERECTOMY W/ BILATERAL SALPINGOOPHORECTOMY    . TOTAL KNEE ARTHROPLASTY Right 04/03/2015   Procedure: RIGHT TOTAL KNEE ARTHROPLASTY;  Surgeon: Leandrew Koyanagi, MD;  Location: [place];  Service: Orthopedics;  Laterality: Right;  . TOTAL KNEE ARTHROPLASTY Left 04/03/2015   Procedure: LEFT TOTAL KNEE ARTHROPLASTY;  Surgeon: Leandrew Koyanagi, MD;  Location: [place];  Service: Orthopedics;  Laterality: Left;  Marland Kitchen VAGINAL DELIVERY     ?x2    ROS: Review of Systems  Constitutional: Negative for appetite change.  Respiratory: Negative for chest tightness and shortness of breath.  Cardiovascular: Negative for chest pain and leg swelling.    PHYSICAL EXAM: BP (!) 161/80   Pulse (!) 52   Temp 97.8 F (36.6 C) (Oral)   Resp 16   Wt 116 lb 6.4 oz (52.8 kg)   SpO2 98%   BMI 28.59 kg/m   Repeat BP 160/80 Wt Readings from Last 3 Encounters:  10/07/16 116 lb 6.4 oz (52.8 kg)  04/05/16 112 lb 3.2 oz (50.9 kg)  01/21/16 115 lb 12.8 oz (52.5 kg)   Physical Exam General appearance - alert, well appearing, and in no distress Mental status - alert, oriented to person, place, and time, normal mood, behavior, speech, dress, motor activity, and thought processes Neck - supple, no significant adenopathy Chest - clear to auscultation, no wheezes, rales  or rhonchi, symmetric air entry Heart - normal rate, regular rhythm, normal S1, S2, no murmurs, rubs, clicks or gallops Extremities - peripheral pulses normal, no pedal edema, no clubbing or cyanosis  Depression screen [facility] 2/9 10/07/2016 04/05/2016 01/21/2016 02/13/2015 05/23/2014  Decreased Interest 0 0 0 0 0  Down, Depressed, Hopeless 0 0 0 0 0  PHQ - 2 Score 0 0 0 0 0     Chemistry      Component Value Date/Time   NA 135 01/23/2016 0054   K 3.6 01/23/2016 0054   CL 99 (L) 01/23/2016 0054   CO2 25 01/23/2016 0054   BUN 20 01/23/2016 0054   CREATININE 0.60 01/23/2016 0054   CREATININE 0.53 01/21/2016 0921      Component Value Date/Time   CALCIUM 10.1 01/23/2016 0054   ALKPHOS 77 01/23/2016 0054   AST 26 01/23/2016 0054   ALT 19 01/23/2016 0054   BILITOT 1.0 01/23/2016 0054     Lab Results  Component Value Date   WBC 8.3 01/23/2016   HGB 14.1 01/23/2016   HCT 41.7 01/23/2016   MCV 90.5 01/23/2016   PLT 256 01/23/2016   ASSESSMENT AND PLAN: 1. Hypertension, benign -Advised checking blood pressure 2 times a week, record readings and return in 2 weeks to see our licensed clinical pharmacists. If recorded blood pressures are greater than 140/90 then I would recommend starting her on Norvasc 5 mg.  2. Hyperlipidemia, unspecified hyperlipidemia type Tolerating Pravachol. Will be due for a lipid profile checked on next visit  3. Dysuria - Urinalysis  4. OA of the knees status post TKA BL -Patient has done over a year of physical therapy and seems to be doing quite well. She is going to the gym 2 days a week on her own to exercise. I recommend she speaks with the orthopedics to see whether he would recommend any further physical therapy at this time.  Patient was given the opportunity to ask questions.  Patient verbalized understanding of the plan and was able to repeat key elements of the plan.  [place] interpreter used during this encounter.   Orders Placed This Encounter    Procedures  . Urinalysis     Requested Prescriptions    No prescriptions requested or ordered in this encounter    Return in about 3 months (around 01/07/2017).  Karle Plumber, MD, FACP

## 2016-10-08 LAB — URINALYSIS
Bilirubin, UA: NEGATIVE
Glucose, UA: NEGATIVE
KETONES UA: NEGATIVE
LEUKOCYTES UA: NEGATIVE
NITRITE UA: NEGATIVE
Protein, UA: NEGATIVE
RBC, UA: NEGATIVE
Specific Gravity, UA: 1.018 (ref 1.005–1.030)
Urobilinogen, Ur: 0.2 mg/dL (ref 0.2–1.0)
pH, UA: 8 — ABNORMAL HIGH (ref 5.0–7.5)

## 2016-10-21 ENCOUNTER — Ambulatory Visit: Payer: Self-pay | Attending: Internal Medicine | Admitting: Pharmacist

## 2016-10-21 ENCOUNTER — Telehealth: Payer: Self-pay

## 2016-10-21 VITALS — BP 138/83 | HR 60

## 2016-10-21 DIAGNOSIS — I10 Essential (primary) hypertension: Secondary | ICD-10-CM | POA: Insufficient documentation

## 2016-10-21 NOTE — Progress Notes (Signed)
   S:    Patient arrives in good spirits. Presents to the clinic for hypertension evaluation. Patient was referred on 10/07/16.  Patient was last seen by Primary Care Provider on 10/07/16. [place] interpreter [name] [phone] was used for the entirety of the visit.   Current BP Medications include:  none  Antihypertensives tried in the past include: was prescribed HCTZ in the past but didn't take it because she wanted to control her blood pressure with diet and exercise.  Dietary habits include: limits salt intake  She has been exercising.  She does report being stressed when coming to the doctor because she is afraid she will get bad news. She checked her blood pressure earlier today and the SBP was 120.   O:   Last 3 Office BP readings: BP Readings from Last 3 Encounters:  10/21/16 138/83  10/07/16 (!) 161/80  04/05/16 136/78    BMET    Component Value Date/Time   NA 135 01/23/2016 0054   K 3.6 01/23/2016 0054   CL 99 (L) 01/23/2016 0054   CO2 25 01/23/2016 0054   GLUCOSE 105 (H) 01/23/2016 0054   BUN 20 01/23/2016 0054   CREATININE 0.60 01/23/2016 0054   CREATININE 0.53 01/21/2016 0921   CALCIUM 10.1 01/23/2016 0054   GFRNONAA >60 01/23/2016 0054   GFRNONAA >89 01/21/2016 0921   GFRAA >60 01/23/2016 0054   GFRAA >89 01/21/2016 0921    A/P: Hypertension longstanding currently slightly elevated with lifestyle modifcations but this is likely due to stress as her recent home blood pressures have been at goal. Blood pressure was <140/90 so will not start any medication at this time per Dr. Wynetta Emery.   Results reviewed and written information provided.   Total time in face-to-face counseling 15 minutes.   F/U Clinic Visit with Dr. Wynetta Emery.

## 2016-10-21 NOTE — Telephone Encounter (Signed)
CMA call regarding results  Patient verify DOB  Patient was aware and understood  

## 2016-10-21 NOTE — Patient Instructions (Signed)
Thanks for coming to see me!  Our goal is to keep your blood pressure <140/90, even better would be <130/80.  Follow up with Dr. Wynetta Emery as directed  Plan de alimentacin DASH (DASH Eating Plan) DASH es la sigla en ingls de "Enfoques Alimentarios para Detener la Hipertensin". El plan de alimentacin DASH ha demostrado bajar la presin arterial elevada (hipertensin). Los beneficios adicionales para la salud pueden incluir la disminucin del riesgo de diabetes mellitus tipo2, enfermedades cardacas e ictus. Este plan tambin puede ayudar a Horticulturist, commercial. QU DEBO SABER ACERCA DEL PLAN DE ALIMENTACIN DASH? Para el plan de alimentacin DASH, seguir las siguientes pautas generales:  Elija los alimentos que contienen menos de 150 miligramos de sodio por porcin (segn se indica en la etiqueta de los alimentos).  Use hierbas o aderezos sin sal, en lugar de sal de mesa o sal marina.  Consulte al mdico o farmacutico antes de usar sustitutos de la sal.  Consuma los productos con menor contenido de sodio. Estos productos suelen estar etiquetados como "bajo en sodio" o "sin agregado de sal".  Coma alimentos frescos. No consuma una gran cantidad de alimentos enlatados.  Coma ms verduras, frutas y productos lcteos con bajo contenido de [place].  Elija los cereales integrales. Busque la palabra "integral" en Equities trader de la lista de ingredientes.  Elija el pescado y el pollo o el pavo sin piel ms a menudo que las carnes rojas. Limite el consumo de pescado, carne de ave y carne a 6onzas (170g) por Training and development officer.  Limite el consumo de dulces, postres, azcares y bebidas azucaradas.  Elija las grasas saludables para el corazn.  Consuma ms comida casera y menos de restaurante, de buf y comida rpida.  Limite el consumo de alimentos fritos.  No fra los alimentos. A la hora de cocinarlos, opte por hornearlos, hervirlos, grillarlos y asarlos a Administrator, arts.  Cuando coma en un restaurante,  pida que preparen su comida con menos sal o, en lo posible, sin nada de sal. QU ALIMENTOS PUEDO COMER? Pida ayuda a un nutricionista para conocer las necesidades calricas individuales. Cereales Pan de salvado o integral. Arroz integral. Pastas de salvado o integrales. Quinua, trigo burgol y cereales integrales. Cereales con bajo contenido de sodio. Tortillas de harina de maz o de salvado. Pan de maz integral. Galletas saladas integrales. Galletas con bajo contenido de [place]. Vegetales Verduras frescas o congeladas (crudas, al vapor, asadas o grilladas). Jugos de tomate y verduras con contenido bajo o reducido de sodio. Pasta y salsa de tomate con contenido bajo o [place]. Verduras enlatadas con bajo contenido de sodio o reducido de sodio. Lambert Mody Lambert Mody frescas, en conserva (en su jugo natural) o frutas congeladas. Carnes y otros productos con protenas Carne de res molida (al 85% o ms [place]), carne de res de animales alimentados con pastos o carne de res sin la grasa. Pollo o pavo sin piel. Carne de pollo o de [place]. Cerdo sin la grasa. Todos los pescados y frutos de mar. Huevos. Porotos, guisantes o lentejas secos. Frutos secos y semillas sin sal. Frijoles enlatados sin sal. Lcteos Productos lcteos con bajo contenido de grasas, como [place] o al 1%, quesos reducidos en grasas o al 2%, ricota con bajo contenido de grasas o [employer]ere & [employer], o yogur natural con bajo contenido de [place]. Quesos con contenido bajo o reducido de sodio. Grasas y Naval architect en barra que no contengan grasas trans. [place] y alios para ensaladas livianos o reducidos en grasas (  reducidos en sodio). [place]. Aceites de crtamo, oliva o canola. [place] natural de man o almendra. Otros Palomitas de maz y pretzels sin sal. Los artculos mencionados arriba pueden no ser Dean Foods Company de las bebidas o los alimentos recomendados. Comunquese con el nutricionista para conocer  ms opciones. QU ALIMENTOS NO SE RECOMIENDAN? Cereales Pan blanco. Pastas blancas. Arroz blanco. Pan de maz refinado. Bagels y croissants. Galletas saladas que contengan grasas trans. Vegetales Vegetales con crema o fritos. Verduras en [place]. Verduras enlatadas comunes. Pasta y salsa de tomate en lata comunes. Jugos comunes de tomate y de verduras. Lambert Mody Fruta enlatada en almbar liviano o espeso. Jugo de frutas. Carnes y otros productos con protenas [place] de carne con Lobbyist. Costillas, alas de pollo, tocineta, salchicha, mortadela, salame, chinchulines, tocino, perros calientes, salchichas alemanas y embutidos envasados. Frutos secos y semillas con sal. Frijoles con sal en lata. Lcteos Leche entera o al 2%, crema, mezcla de [place] y crema, y queso crema. Yogur entero o endulzado. Quesos o queso azul con alto contenido de Physicist, medical. [place]rem[place] no lcteas y coberturas batidas. Quesos procesados, quesos para untar o cuajadas. Condimentos Sal de cebolla y ajo, sal condimentada, sal de mesa y sal marina. Salsas en lata y envasadas. Salsa [place]. Salsa trtara. Salsa barbacoa. Salsa teriyaki. Salsa de soja, incluso la que tiene contenido reducido de [place]. Salsa de carne. Salsa de pescado. Salsa de [place]. Salsa rosada. Rbano picante. Ketchup y mostaza. Saborizantes y tiernizantes para carne. Caldo en cubitos. Salsa picante. Salsa tabasco. Adobos. Aderezos para tacos. Salsas. Grasas y aceites [place], [place] en barra, [place] de [place], [place], [place] clarificada y Wendee Copp de tocino. Aceites de coco, de palmiste o de palma. Aderezos comunes para ensalada. Otros Pickles y [place]. Palomitas de maz y pretzels con sal. Los artculos mencionados arriba pueden no ser Dean Foods Company de las bebidas y los alimentos que se Higher education careers adviser. Comunquese con el nutricionista para obtener ms informacin. DNDE Dolan Amen MS INFORMACIN? [place], del  Pulmn y de Herbalist (National Heart, Lung, and [place]): travelstabloid.com Esta informacin no tiene Marine scientist el consejo del mdico. Asegrese de hacerle al mdico cualquier pregunta que tenga. Document Released: 03/25/2011 Document Revised: 07/28/2015 Document Reviewed: 02/07/2013 Elsevier Interactive Patient Education  2017 [employer].

## 2016-10-21 NOTE — Telephone Encounter (Signed)
-----   Message from Ladell Pier, MD sent at 10/08/2016  7:52 AM EDT ----- Let pt know UA was negative for UTI

## 2016-10-22 NOTE — Addendum Note (Signed)
Addended by: Rica Mast on: 10/22/2016 08:21 AM   Modules accepted: Level of Service

## 2016-10-23 IMAGING — CR DG KNEE 1-2V PORT*R*
2 series · 2 of 2 positions shown · non-contrast
Comparison: January 23, 2014.

CLINICAL DATA: Status post right total knee replacement.

EXAM:
PORTABLE RIGHT KNEE - 1-2 VIEW

[AP]
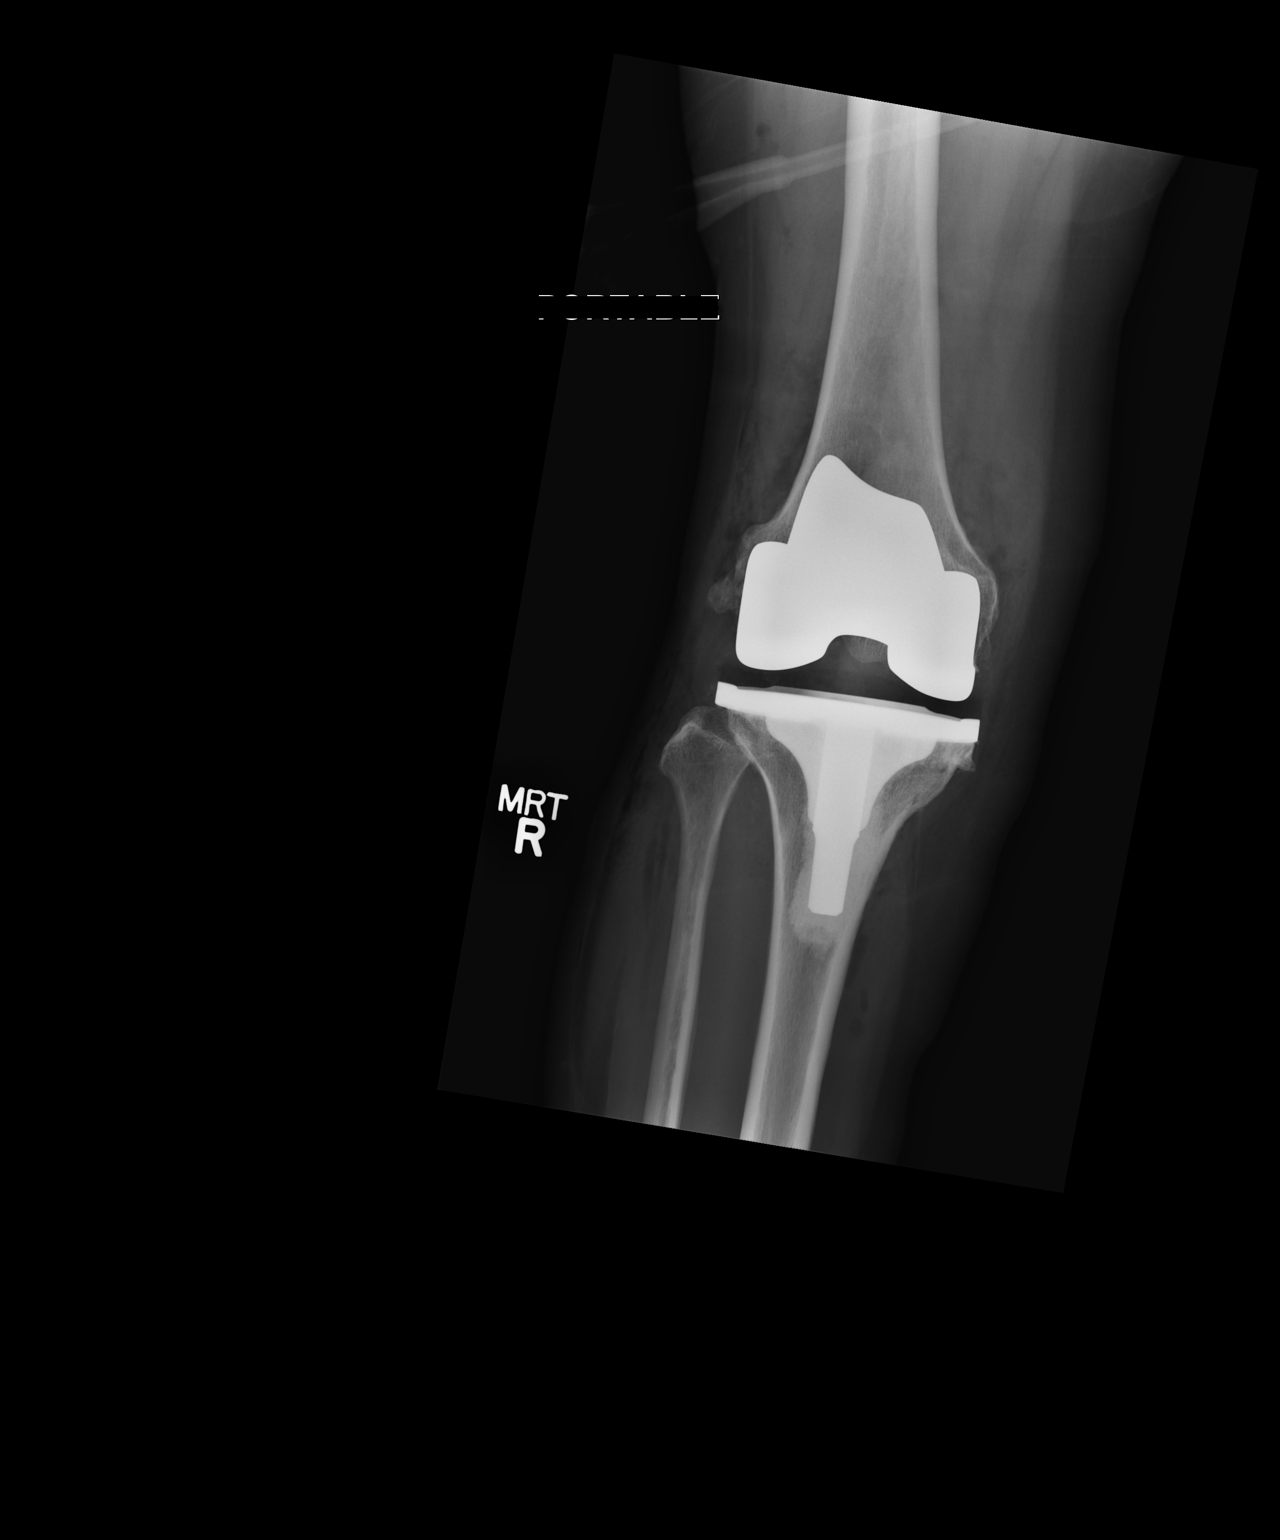

[xtable lateral]
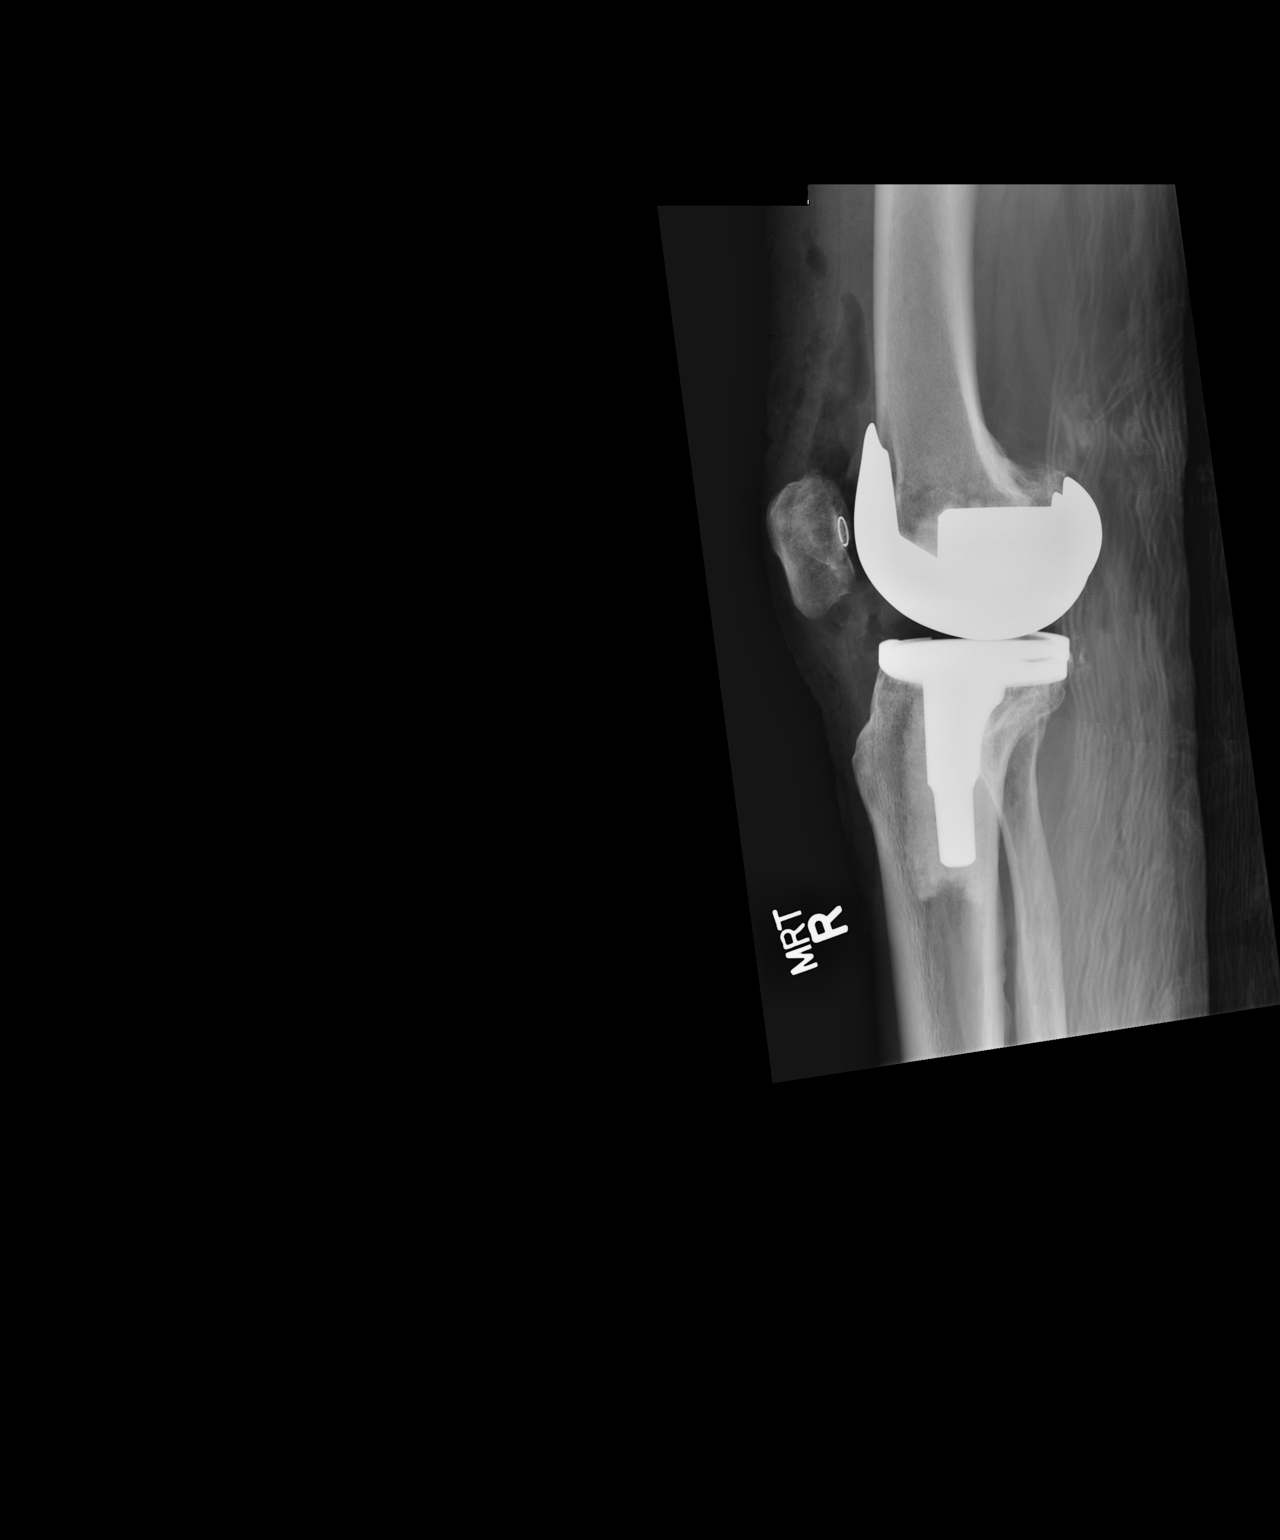

[2 of 2 positions shown; findings below may reference images not displayed]

FINDINGS: The femoral and tibial components appear to be well situated. No
fracture or dislocation is noted. Expected postoperative changes are
noted anteriorly in the soft tissues.
IMPRESSION: Status post right total knee arthroplasty.

## 2016-10-23 IMAGING — CR DG KNEE 1-2V PORT*L*
2 series · 2 of 2 positions shown · non-contrast
Comparison: None.

CLINICAL DATA: Status post left total knee replacement.

EXAM:
PORTABLE LEFT KNEE - 1-2 VIEW

[AP]
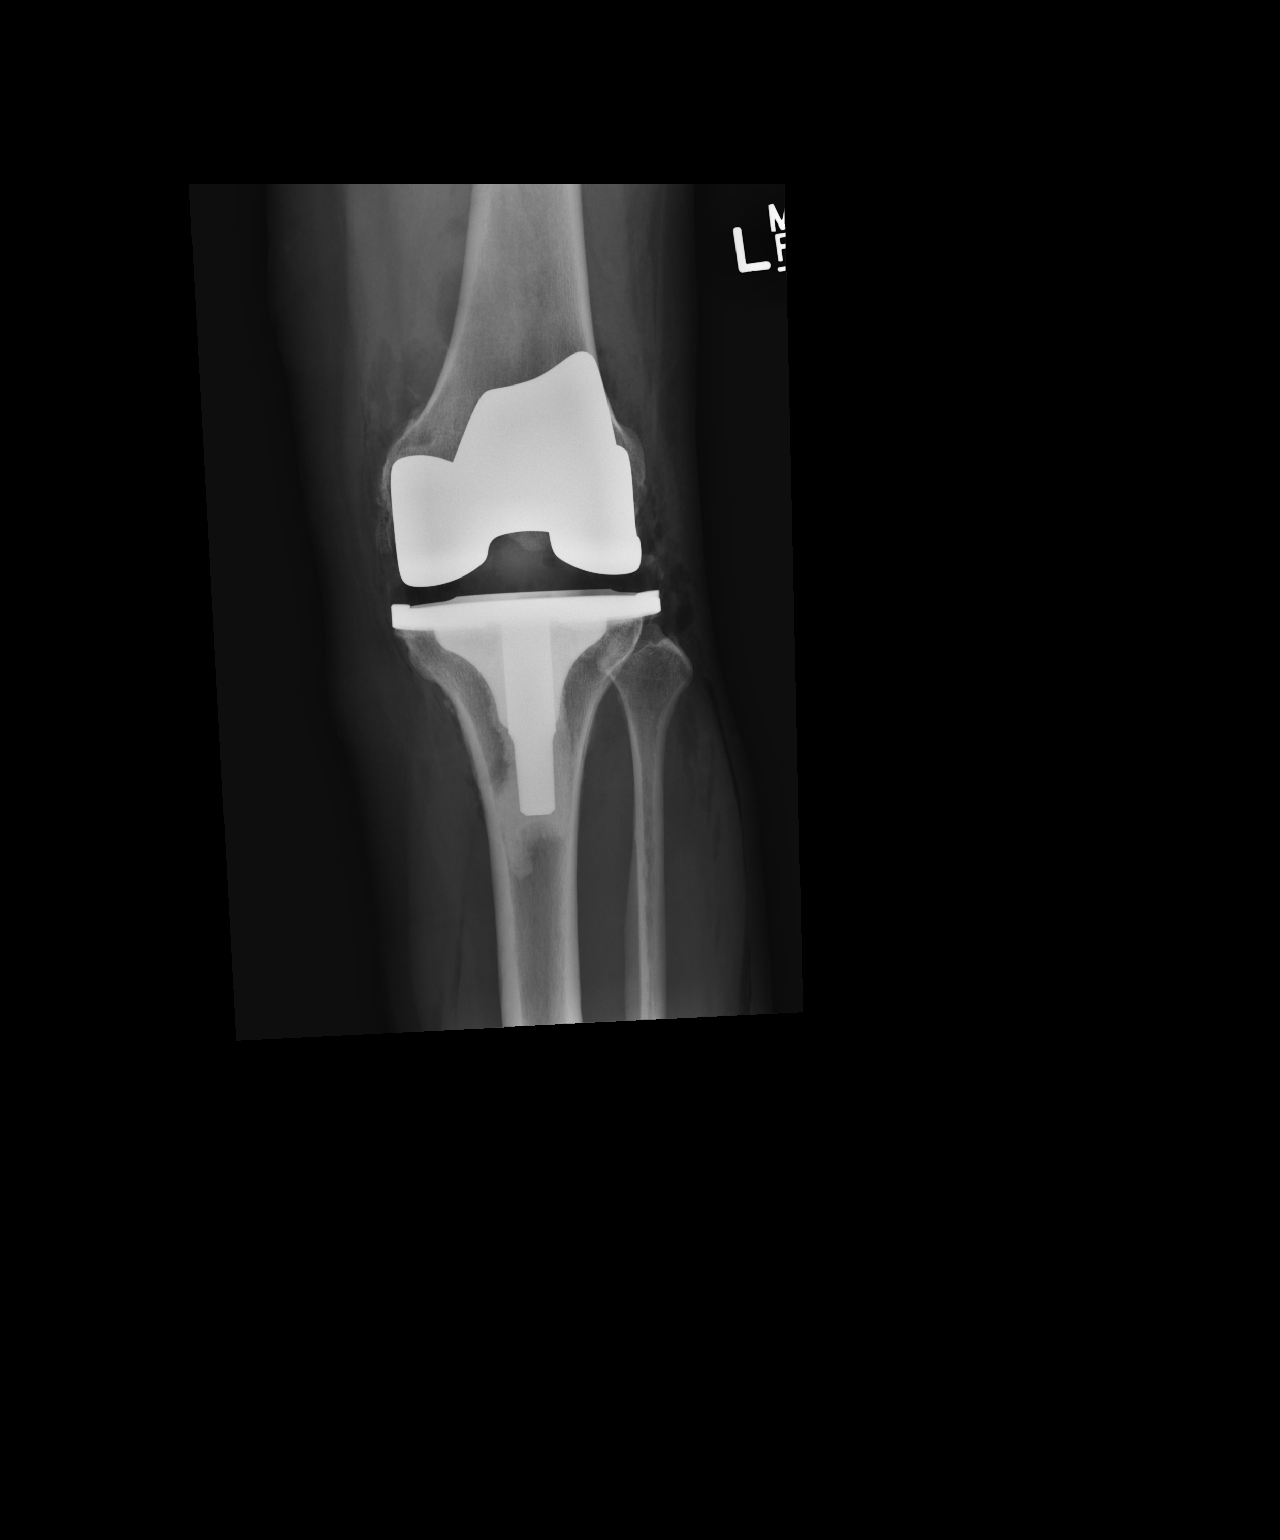

[xtable lateral]
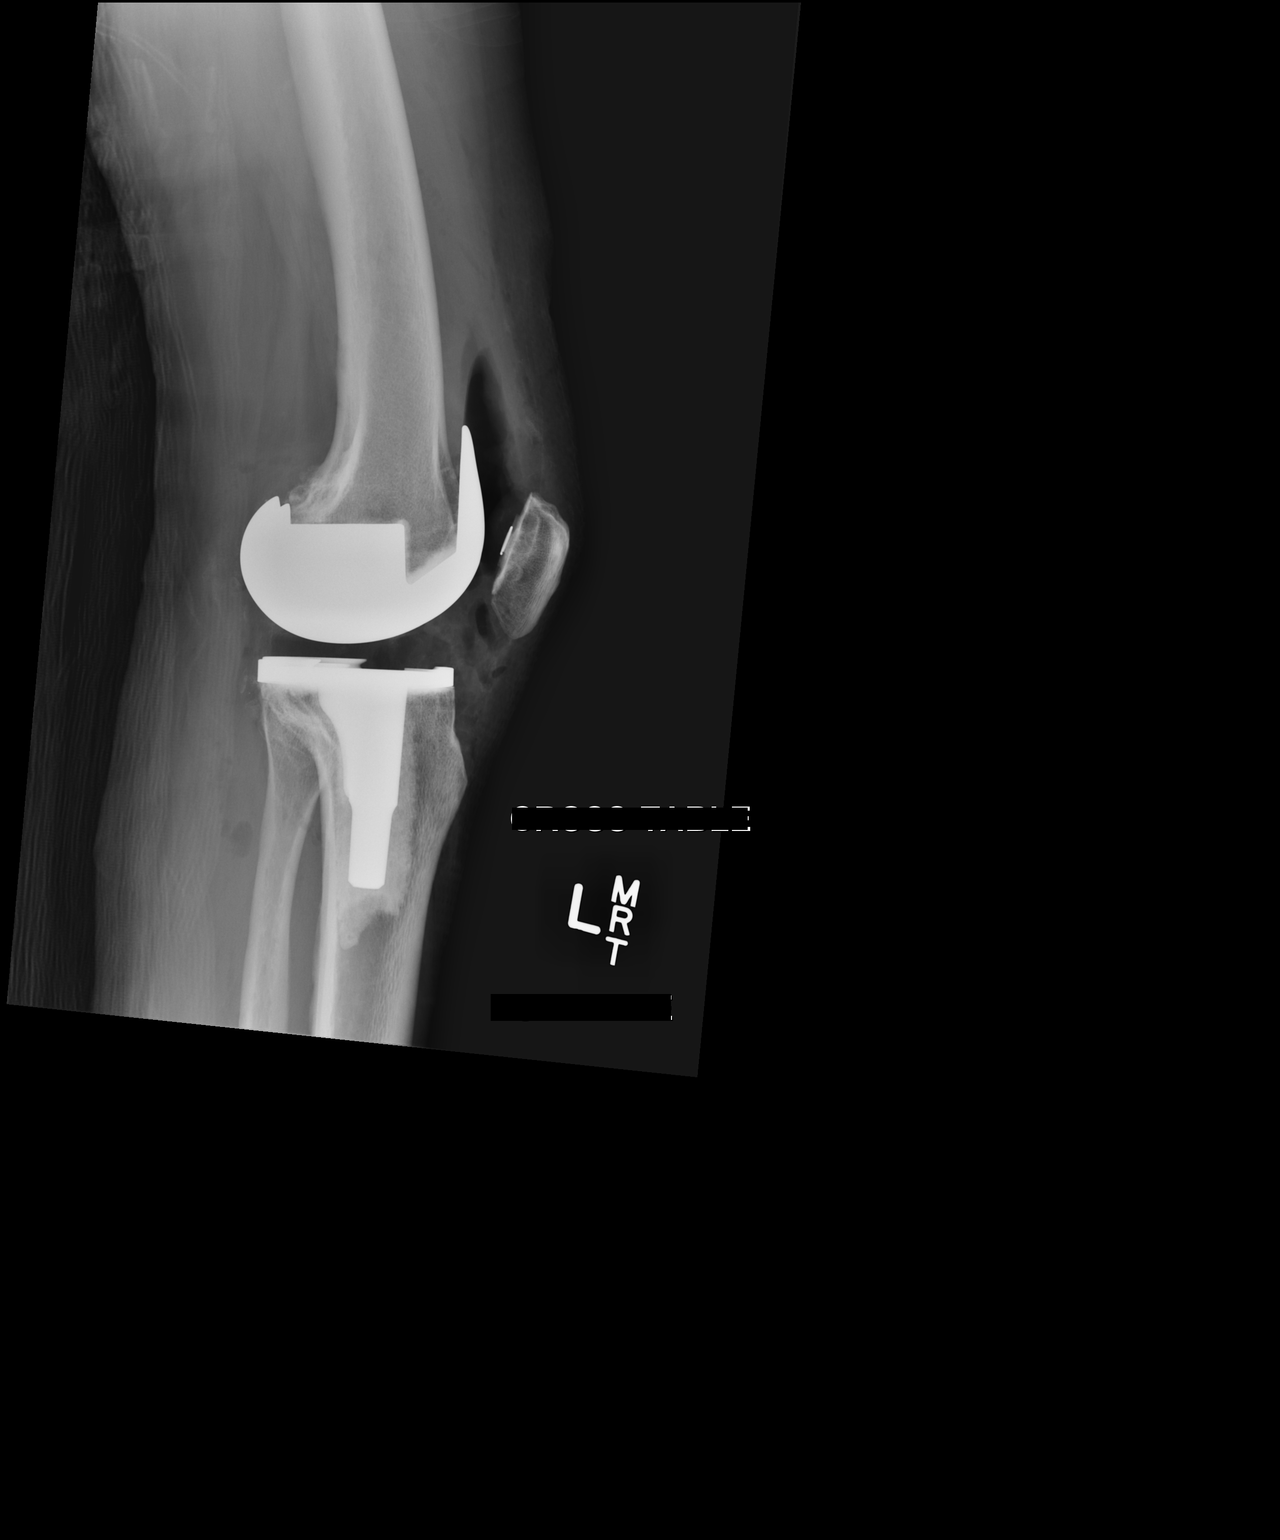

[2 of 2 positions shown; findings below may reference images not displayed]

FINDINGS: The femoral and tibial components appear to be well situated. No
fracture or dislocation is noted. Expected postoperative changes are
noted in anterior soft tissues.
IMPRESSION: Status post left total knee arthroplasty.

## 2016-10-29 ENCOUNTER — Ambulatory Visit: Payer: Self-pay | Attending: Internal Medicine | Admitting: Licensed Clinical Social Worker

## 2016-10-29 ENCOUNTER — Ambulatory Visit: Payer: Self-pay | Attending: Family Medicine | Admitting: Family Medicine

## 2016-10-29 ENCOUNTER — Encounter: Payer: Self-pay | Admitting: Family Medicine

## 2016-10-29 VITALS — BP 113/69 | HR 55 | Temp 97.9°F | Resp 18 | Ht <= 58 in | Wt 116.2 lb

## 2016-10-29 DIAGNOSIS — R51 Headache: Secondary | ICD-10-CM | POA: Insufficient documentation

## 2016-10-29 DIAGNOSIS — F419 Anxiety disorder, unspecified: Principal | ICD-10-CM

## 2016-10-29 DIAGNOSIS — F329 Major depressive disorder, single episode, unspecified: Secondary | ICD-10-CM

## 2016-10-29 DIAGNOSIS — H9313 Tinnitus, bilateral: Secondary | ICD-10-CM | POA: Insufficient documentation

## 2016-10-29 DIAGNOSIS — R519 Headache, unspecified: Secondary | ICD-10-CM

## 2016-10-29 DIAGNOSIS — R42 Dizziness and giddiness: Secondary | ICD-10-CM | POA: Insufficient documentation

## 2016-10-29 DIAGNOSIS — E785 Hyperlipidemia, unspecified: Secondary | ICD-10-CM | POA: Insufficient documentation

## 2016-10-29 DIAGNOSIS — Z8639 Personal history of other endocrine, nutritional and metabolic disease: Secondary | ICD-10-CM

## 2016-10-29 DIAGNOSIS — Z8679 Personal history of other diseases of the circulatory system: Secondary | ICD-10-CM

## 2016-10-29 DIAGNOSIS — I10 Essential (primary) hypertension: Secondary | ICD-10-CM | POA: Insufficient documentation

## 2016-10-29 LAB — POCT UA - MICROALBUMIN
Albumin/Creatinine Ratio, Urine, POC: <30
CREATININE, POC: 100 mg/dL
Microalbumin Ur, POC: 10 mg/L

## 2016-10-29 MED ORDER — ACETAMINOPHEN 500 MG PO TABS: 1000.0000 mg | ORAL_TABLET | Freq: Four times a day (QID) | ORAL | 0 refills | Status: DC | PRN

## 2016-10-29 NOTE — Progress Notes (Signed)
JAPatient is here for dizziness & headaches

## 2016-10-29 NOTE — BH Specialist Note (Signed)
Integrated Behavioral Health Initial Visit  MRN: [id number] Name: Tina Johnson   Session Start time: 10:55 AM Session End time: 11:25 AM Total time: 30 minutes  Type of Service: Tina Johnson:Yes.   Johnson Name and Language: Tina Johnson LZ#[id number] Spanish   Warm Hand Off Completed.       SUBJECTIVE: Tina Johnson is a 51 y.o. female accompanied by patient. Patient was referred by Tina Johnson for anxiety and depression. Patient reports the following symptoms/concerns: feelings of sadness and worry, irritability, and panic attacks Duration of problem: "Two years"; Severity of problem: mild  OBJECTIVE: Mood: Anxious and Affect: Appropriate Risk of harm to self or others: No plan to harm self or others   LIFE CONTEXT: Family and Social: Pt receives support from family and friends who reside nearby School/Work: Pt is unemployed and does not receive any public benefits. She receives Financial Assistance Self-Care: Pt enjoys exercising and talking with loved ones. No report of substance use Life Changes: Pt underwent knee surgery 06/03/14   GOALS ADDRESSED: Patient will reduce symptoms of: anxiety and depression and increase knowledge and/or ability of: coping skills and also: Increase adequate support systems for patient/family   INTERVENTIONS: Solution-Focused Strategies, Supportive Counseling, Psychoeducation and/or Health Education and Link to [employer]  Standardized Assessments completed: PHQ 2&9  ASSESSMENT: Patient currently experiencing depression and anxiety. Patient shared that she experienced overwhelming feelings of sadness after surgery due to inability to exercise for prolonged period of time. Current symptoms are feelings of sadness and worry, irritability, and panic attacks. Patient receives strong emotional support from family and friends. She may benefit from psychotherapy. Terry educated  pt on correlation between one's physical and mental health. Pt successfully identified healthy coping skills to utilize on a routine basis. LCSWA taught pt a grounding exercise to assist during panic attacks. Pt was provided resources for crisis intervention and psychotherapy.   PLAN: 1. Follow up with behavioral health clinician on : Pt was encouraged to contact LCSWA if symptoms worsen or fail to improve to schedule behavioral appointments at [facility]. 2. Behavioral recommendations: LCSWA recommends that pt apply healthy coping skills discussed and utilize provided resources. Pt is encouraged to schedule follow up appointment with LCSWA 3. Referral(s): Armed forces logistics/support/administrative officer (LME/Outside Clinic) and Community Resources:  [employer] for affordable low impact exercise classes 4. "From scale of 1-10, how likely are you to follow plan?": 8/10  Tina Chesterfield, LCSW 11/01/16 4:50 AM

## 2016-10-29 NOTE — Progress Notes (Signed)
Subjective:  Patient ID: Tina Johnson, female    DOB: 03/24/1966  Age: 51 y.o. MRN: [id number]  CC: Dizziness   HPI Tina Johnson presents for anxiety.  She has the following symptoms: dizziness and headaches. Headache onset of symptoms was approximately 5 days ago. Denies taking anything for symptoms. She also reports history of 5 months tinnitus and has been unchanged since that time. She denies any trauma to the ears, vomiting, or difficulty keeping her balance.She reports anxious mood for last 4 months. She denies current suicidal and homicidal ideation. Family history significant for no psychiatric illness.Possible organic causes contributing are: none. Risk factors: none Previous treatment: none.    Outpatient Medications Prior to Visit  Medication Sig Dispense Refill  . pravastatin (PRAVACHOL) 20 MG tablet Take 1 tablet (20 mg total) by mouth daily. (Patient not taking: Reported on 10/29/2016) 90 tablet 3   No facility-administered medications prior to visit.     ROS Review of Systems  Constitutional: Negative.   HENT: Positive for tinnitus.   Eyes: Negative.   Respiratory: Negative.   Cardiovascular: Negative.   Gastrointestinal: Negative.   Neurological: Positive for dizziness and headaches.  Psychiatric/Behavioral: The patient is nervous/anxious.    Objective:  BP 113/69 (BP Location: Left Arm, Patient Position: Sitting, Cuff Size: Normal)   Pulse (!) 55   Temp 97.9 F (36.6 C) (Oral)   Resp 18   Ht '4\' 6"'  (1.372 m)   Wt 116 lb 3.2 oz (52.7 kg)   SpO2 97%   BMI 28.02 kg/m   BP/Weight 10/29/2016 10/21/2016 3/53/2992  Systolic BP 426 834 196  Diastolic BP 69 83 80  Wt. (Lbs) 116.2 - 116.4  BMI 28.02 - 28.59     Physical Exam  Constitutional: She is oriented to person, place, and time. She appears well-developed and well-nourished.  HENT:  Head: Normocephalic and atraumatic.  Right Ear: Tympanic membrane and ear canal normal.  Left Ear:  Tympanic membrane and ear canal normal.  Nose: Nose normal.  Mouth/Throat: Oropharynx is clear and moist.  Eyes: Pupils are equal, round, and reactive to light. Conjunctivae are normal.  Neck: Normal range of motion. Neck supple.  Cardiovascular: Normal rate, regular rhythm, normal heart sounds and intact distal pulses.   Pulmonary/Chest: Effort normal and breath sounds normal.  Abdominal: Soft. Bowel sounds are normal.  Musculoskeletal: Normal range of motion.  Neurological: She is alert and oriented to person, place, and time. No cranial nerve deficit.  Skin: Skin is warm and dry.  Psychiatric: Her mood appears anxious. She expresses no homicidal and no suicidal ideation. She expresses no suicidal plans and no homicidal plans.  Nursing note and vitals reviewed.  Assessment & Plan:   Problem List Items Addressed This Visit    None    Visit Diagnoses    Anxiety    -  Primary   Tinnitus of both ears       Relevant Orders   Ambulatory referral to Audiology   Acute nonintractable headache, unspecified headache type       Relevant Medications   acetaminophen (TYLENOL) 500 MG tablet   Vertigo       Relevant Orders   Ambulatory referral to Audiology   History of hypertension       Relevant Orders   Lipid Panel (Completed)   POCT UA - Microalbumin (Completed)   CMP14+EGFR (Completed)   History of hyperlipidemia       Relevant Orders   Lipid Panel (Completed)  CMP14+EGFR (Completed)      Meds ordered this encounter  Medications  . acetaminophen (TYLENOL) 500 MG tablet    Sig: Take 2 tablets (1,000 mg total) by mouth every 6 (six) hours as needed for headache.    Dispense:  30 tablet    Refill:  0    Order Specific Question:   Supervising Provider    Answer:   Tresa Garter [id number]    Follow-up: Return in about 3 months (around 01/29/2017), or if symptoms worsen or fail to improve, for HLD/HTN.   Alfonse Spruce FNP

## 2016-10-29 NOTE — Patient Instructions (Signed)
Tinnitus (Tinnitus) El trmino tinnitus hace referencia a la percepcin de un sonido que no se corresponde con ninguna fuente real para ese sonido. A menudo se lo describe como zumbido de odos. Sin embargo, las personas que sufren esta afeccin pueden percibir diferentes ruidos. Una persona puede percibir el sonido en uno o en ambos odos. Los sonidos del tinnitus pueden ser [place], fuertes o de intensidad intermedia. El tinnitus puede prolongarse pocos segundos o ser constante [place]. Puede desaparecer sin tratamiento y regresar en distintos momentos. Cuando el tinnitus es permanente u ocurre con frecuencia, puede causar otros problemas, por ejemplo, dificultad para dormir y para concentrarse. Casi todas las Engineer, manufacturing tinnitus en algn momento. El tinnitus a Barrister's clerk (crnico) o que regresa con frecuencia es un problema que puede requerir Geophysical data processor. CAUSAS A menudo se desconoce la causa del tinnitus. En algunos casos, puede ser [employer] de otros problemas u otras afecciones, entre ellas:  Exposicin a ruidos fuertes de [place], [place] u otras fuentes.  Prdida auditiva.  Infecciones de los odos o de los senos paranasales.  Acumulacin de cerumen.  Un objeto extrao en el odo.  Uso de ciertos medicamentos.  Consumo de alcohol y cafena.  Hipertensin arterial.  Cardiopatas.  Anemia.  Alergias.  Enfermedad de [place].  Problemas de tiroides.  Tumores.  Dilatacin de una porcin de un vaso sanguneo debilitado (aneurisma). SNTOMAS El principal sntoma de tinnitus es la percepcin de un sonido que no se corresponde con ninguna fuente, no proviene de Horticulturist, commercial. El sonido puede percibirse como lo siguiente:  Timbre.  Crepitacin.  Zumbido.  El soplido del aire, similar al sonido que se percibe en una caracola.  Sibilancia.  Silbido.  Chisporroteo.  Runrn.  Una corriente de agua.  Una nota musical  sostenida. DIAGNSTICO El diagnstico de tinnitus se basa en los sntomas. El [employer] har un examen fsico. Se realizar un examen auditivo exhaustivo (audiometra) si el tinnitus:  Afecta un solo odo (unilateral).  Causa dificultades [id number].  Dura ms de 64meses. Adems, tal vez deba consultar a un mdico especialista en trastornos auditivos (audilogo). Pueden pedirle que responda un cuestionario para determinar la gravedad del tinnitus que padece. Se pueden hacer estudios para ayudar a Office manager causa y Paramedic otras enfermedades. Estos pueden incluir los siguientes:  Estudios de diagnstico por imgenes de la cabeza y el cerebro, por ejemplo: ? Tomografa computarizada. ? Resonancia magntica.  Un estudio de diagnstico por imgenes de los vasos sanguneos (angiografa). TRATAMIENTO A veces, el tratamiento de una enfermedad preexistente hace que el tinnitus desaparezca. Si el tinnitus contina, probablemente debe realizar [employer] tratamientos, [place] otros:  Medicamentos, como determinados antidepresivos o pastillas para dormir.  Generadores de sonido para enmascarar el tinnitus. Estos incluyen los siguientes: ? Aparatos de sonido de mesa que reproducen sonidos relajantes para ayudarlo a dormir. ? Dispositivos inteligentes que se adaptan al odo y reproducen sonidos o [place]. ? Un pequeo dispositivo que [place] auriculares para emitir una seal con msica (estimulacin Web designer). Con el tiempo, esto puede modificar las redes del cerebro y reducir la sensibilidad al tinnitus. Este dispositivo se [place] en los casos muy graves cuando ningn otro tratamiento resulta eficaz.  Terapia y orientacin para ayudarlo a controlar el estrs que significa vivir con tinnitus.  El uso de audfonos o implantes cocleares, si el tinnitus guarda relacin con la prdida de la audicin. INSTRUCCIONES PARA EL CUIDADO EN EL HOGAR  Cuando sea posible, no permanezca en  lugares ruidosos y no se exponga a sonidos fuertes.  Use dispositivos de proteccin de la audicin, por ejemplo, tapones, cuando est expuesto a ruidos fuertes.  No consuma sustancias estimulantes, como nicotina, alcohol o cafena.  Ponga en prctica tcnicas para reducir el estrs, como meditacin, yoga o respiracin profunda.  Use un aparato de sonido de fondo, un humidificador u otros dispositivos para enmascarar el sonido del tinnitus.  Duerma con la cabeza levemente elevada. Esto puede reducir el impacto del tinnitus.  Intente descansar lo suficiente todas las noches. SOLICITE ATENCIN MDICA SI:  Tiene tinnitus en un solo odo.  El tinnitus se prolonga durante 3semanas o ms tiempo y no se detiene.  Las medidas de cuidados en el hogar no Barista.  Tiene tinnitus despus de sufrir una lesin en la cabeza.  Tiene tinnitus junto con alguno de estos sntomas: ? Mareos. ? Prdida del equilibrio. ? Nuseas y vmitos. Esta informacin no tiene Marine scientist el consejo del mdico. Asegrese de hacerle al mdico cualquier pregunta que tenga. Document Released: 04/05/2005 Document Revised: 04/26/2014 Document Reviewed: 09/05/2013 Elsevier Interactive Patient Education  2018 [place] de ansiedad generalizada (Generalized Anxiety Disorder) El trastorno de ansiedad generalizada es un trastorno mental. Interfiere en las funciones vitales, incluyendo las [place], el trabajo y la escuela.  Es diferente de la ansiedad normal que todas las personas experimentan en algn momento de su vida en respuesta a sucesos y Chief Executive Officer. En verdad, la ansiedad normal nos ayuda a prepararnos y Brewing technologist acontecimientos y actividades de la vida. La ansiedad normal desaparece despus de que el evento o la actividad ha finalizado.  El trastorno de ansiedad generalizada no est necesariamente relacionada con eventos o actividades especficas. Tambin causa  un exceso de ansiedad en proporcin a sucesos o actividades especficas. En este trastorno la ansiedad es difcil de Chief Technology Officer. Los sntomas pueden variar de leves a muy graves. Las personas que sufren de trastorno de ansiedad generalizada pueden tener intensas olas de ansiedad con sntomas fsicos (ataques de pnico).  SNTOMAS  La ansiedad y la preocupacin asociada a este trastorno son difciles de Chief Technology Officer. Esta ansiedad y la preocupacin estn relacionados con muchos eventos de la vida y sus actividades y tambin ocurre durante ms [employer] que no ocurre, durante 6 meses o ms. Las personas que la sufren pueden tener tres o ms de los siguientes sntomas (uno o ms en los nios):   Customer service manager.  Dificultades de concentracin.   Irritabilidad.  Tensin muscular  Dificultad para dormirse o sueo poco satisfactorio. DIAGNSTICO  Se diagnostica a travs de una evaluacin realizada por el mdico. El mdico le har preguntas acerca de su estado de nimo, sntomas fsicos y sucesos de [place] vida. Le har preguntas sobre su historia clnica, el consumo de alcohol o drogas, incluyendo los medicamentos recetados. [employer] un examen fsico e indicar anlisis de [place]. Ciertas enfermedades y el uso de determinadas sustancias pueden causar sntomas similares a este trastorno. Su mdico lo puede derivar a Teaching laboratory technician en salud mental para una evaluacin ms profunda.Belva Crome  Las terapias siguientes se utilizan en el tratamiento de este trastorno:   Medicamentos - Se recetan antidepresivos para el control diario a Barrister's clerk. Pueden indicarse tambin medicamentos para combatir la [place] graves, especialmente cuando ocurren ataques de pnico.   Terapia conversada (psicoterapia) Ciertos tipos de psicoterapia pueden ser tiles en el tratamiento del trastorno de ansiedad generalizada, proporcionando apoyo, educacin  y orientacin. Una forma de psicoterapia  llamada terapia cognitivo-conductual puede ensearle formas saludables de pensar y Firefighter a los eventos y actividades de la vida diaria.  Tcnicasde manejo del estrs- Estas tcnicas incluyen el yoga, la meditacin y el ejercicio y pueden ser muy tiles cuando se practican con regularidad. Un especialista en salud mental puede ayudar a determinar qu tratamiento es mejor para usted. Algunas personas obtienen mejora con una terapia. Sin embargo, Producer, television/film/video requieren una combinacin de terapias.  Esta informacin no tiene Marine scientist el consejo del mdico. Asegrese de hacerle al mdico cualquier pregunta que tenga. Document Released: 07/31/2012 Document Revised: 04/26/2014 Elsevier Interactive Patient Education  2017 [employer].

## 2016-10-30 LAB — CMP14+EGFR
A/G RATIO: 1.4 (ref 1.2–2.2)
ALK PHOS: 85 IU/L (ref 39–117)
ALT: 18 IU/L (ref 0–32)
AST: 21 IU/L (ref 0–40)
Albumin: 4.3 g/dL (ref 3.5–5.5)
BUN/Creatinine Ratio: 41 — ABNORMAL HIGH (ref 9–23)
BUN: 21 mg/dL (ref 6–24)
Bilirubin Total: 0.4 mg/dL (ref 0.0–1.2)
CALCIUM: 9.6 mg/dL (ref 8.7–10.2)
CO2: 25 mmol/L (ref 20–29)
Chloride: 104 mmol/L (ref 96–106)
Creatinine, Ser: 0.51 mg/dL — ABNORMAL LOW (ref 0.57–1.00)
GFR calc Af Amer: 128 mL/min/{1.73_m2} (ref >59)
GFR, EST NON AFRICAN AMERICAN: 111 mL/min/{1.73_m2} (ref >59)
GLOBULIN, TOTAL: 3.1 g/dL (ref 1.5–4.5)
Glucose: 93 mg/dL (ref 65–99)
POTASSIUM: 4.2 mmol/L (ref 3.5–5.2)
SODIUM: 143 mmol/L (ref 134–144)
Total Protein: 7.4 g/dL (ref 6.0–8.5)

## 2016-10-30 LAB — LIPID PANEL
CHOL/HDL RATIO: 3.1 ratio (ref 0.0–4.4)
Cholesterol, Total: 188 mg/dL (ref 100–199)
HDL: 60 mg/dL (ref >39)
LDL Calculated: 113 mg/dL — ABNORMAL HIGH (ref 0–99)
Triglycerides: 74 mg/dL (ref 0–149)
VLDL CHOLESTEROL CAL: 15 mg/dL (ref 5–40)

## 2016-11-01 ENCOUNTER — Other Ambulatory Visit: Payer: Self-pay | Admitting: Family Medicine

## 2016-11-01 ENCOUNTER — Telehealth: Payer: Self-pay

## 2016-11-01 DIAGNOSIS — E785 Hyperlipidemia, unspecified: Secondary | ICD-10-CM

## 2016-11-01 MED ORDER — PRAVASTATIN SODIUM 20 MG PO TABS: 20.0000 mg | ORAL_TABLET | Freq: Every day | ORAL | 0 refills | Status: DC

## 2016-11-01 NOTE — Telephone Encounter (Signed)
Patient return CMA call  Patient verify DOB   Patient was aware and understood  

## 2016-11-01 NOTE — Telephone Encounter (Signed)
-----   Message from Alfonse Spruce, [place] sent at 11/01/2016 10:28 AM EDT ----- LDL cholesterol is elevated. This can increase your risk of heart disease. You will be prescribed pravastatin.  Kidney function normal Liver function normal Recommend follow up in 3 months

## 2016-11-01 NOTE — Telephone Encounter (Signed)
CMA call regarding lab results   Patient did not answer but left a VM stating the reason of the call & to call back  

## 2016-11-25 ENCOUNTER — Encounter: Payer: Self-pay | Admitting: Internal Medicine

## 2016-11-25 ENCOUNTER — Ambulatory Visit: Payer: Self-pay | Attending: Family Medicine | Admitting: Audiology

## 2016-11-25 DIAGNOSIS — R292 Abnormal reflex: Secondary | ICD-10-CM | POA: Insufficient documentation

## 2016-11-25 DIAGNOSIS — H918X1 Other specified hearing loss, right ear: Secondary | ICD-10-CM | POA: Insufficient documentation

## 2016-11-25 DIAGNOSIS — IMO0001 Reserved for inherently not codable concepts without codable children: Secondary | ICD-10-CM

## 2016-11-25 DIAGNOSIS — R94120 Abnormal auditory function study: Secondary | ICD-10-CM | POA: Insufficient documentation

## 2016-11-25 DIAGNOSIS — H93213 Auditory recruitment, bilateral: Secondary | ICD-10-CM | POA: Insufficient documentation

## 2016-11-25 DIAGNOSIS — H93A9 Pulsatile tinnitus, unspecified ear: Secondary | ICD-10-CM | POA: Insufficient documentation

## 2016-11-25 DIAGNOSIS — R2689 Other abnormalities of gait and mobility: Secondary | ICD-10-CM | POA: Insufficient documentation

## 2016-11-25 NOTE — Procedures (Signed)
[facility]  [place], [place] [address]  [phone]   Audiological Evaluation  Patient Name: Tina Johnson  Status: Outpatient   DOB: 02/19/1966    Diagnosis: Tinnitus Bilateral, Unsteadiness MRN: [id number] Date:  11/25/2016     Referent: Ladell Pier, MD  History: Tina Johnson was seen for an audiological evaluation. Primary Concern: Pulsing tinnitus in both ears when drinking water or eating with vertigo that spins toward the left for the past 4 months. Prior to this "everything was fine". Information was gathered with an interpreter. Pain: None with ears. Had "knee surgery last year with some pain" - none now. History of hearing problems: Y - the past 4 months. History of ear infections:  N History of dizziness/vertigo:   Y - the past 4 months - feels like will fall to the left. History of balance issues:  Y- past 4 months, feels like will fall down, to the left.  Tinnitus: Y - Pulsing when drinking water or eating. History of diabetes: N History of hypertension: ? - Tina Johnson states that her blood pressure was 136/X recently - she "thinks it is ok".   Evaluation: Conventional pure tone audiometry from 250Hz  - 8000Hz  with using insert earphones.  Hearing Thresholds shows symmetrical hearing thresholds from 250Hz  - 4000Hz  of 15-25 dBHL; 25-30 dBHL at 6000Hz  and at 8000Hz  35 dBHL on the left and 70 dBHL on the right. 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 Reliability is good Speech reception levels (repeating words near threshold) using recorded spondee word lists:  Right ear: 20 dBHL.  Left ear:  20 dBHL Word recognition (at comfortably loud volumes) using recorded SPANISH word lists at 60 dBHL, in quiet.  Right ear: 100%.  Left  ear:   92% Tympanometry (middle ear function) shows normal middle ear volume and pressure with abnormal absent ipsilateral acoustic reflexes from 500Hz  - 4000Hz  bilaterally, except for an elevated response at 500Hz  on the right side.  Right ear: Normal compliance (Type A).  Left ear: shallow compliace (Type As). Distortion Product Otoacoustic Emissions (DPAOE's), a test of inner ear function was completed with symmetrical present result from 2-4KHz and absent results from 4-10kHz.    CONCLUSION:      Cyndie's primary concern is a pulsing, swooshing tinnitus bilaterally, that started four months ago.  Follow-up with the primary physician is strongly recommended because although this may be associated with hypertension, it may also be associated with a "bruit" - the sound of turbulent blood flow through a narrowed vein.  In addition, referral to an ENT may be needed because of the asymmetrical high frequency hearing loss - worse on the right side at 8000Hz  with abnormal acoustic reflexes and high frequency inner ear function bilaterally. To monitor the asymmetrical hearing loss, repeat hearing testing has been scheduled here in 3 months but this may be cancelled if seen by an ENT. Please note that Tina Johnson also "looses balance frequently" and feels that she "will fall toward the left side".   Tina Johnson has normal hearing thresholds from 250Hz  - 4000Hz  with a mild high frequency hearing loss on the left side and a severe hearing loss on the right side. Word recognition is excellent at normal conversational speech levels using a Spanish recording of word lists in quiet. Tina Johnson was counseled and  advised to call Ladell Pier, MD tomorrow for further recommendations.    RECOMMENDATIONS: 1.   Follow-up with Ladell Pier, MD for further recommendations in the next 1-3 days by calling the clinic.  2.   Monitor hearing closely with a repeat audiological evaluation  in 3 months (earlier if there is any change in hearing or ear pressure).  This appointment has been scheduled for November 2018. 3.   Consider further evaluation of the balance (feeling of falling to the left) and  pulsitile tinnitus by an Ear, Nose and Throat physician - especially if the tinnitus changes in pitch, frequency or loudness.  4.   Consider referral for a Balance Assessment at [facility] [place] location, with physical therapy.  [name] [name] Au.D., CCC-A Doctor of Audiology 11/25/2016   cc: Ladell Pier, MD

## 2016-11-25 NOTE — Progress Notes (Signed)
Received message from audiology today regarding results of test today.  Will have front desk schedule f/u appt with me.

## 2016-12-10 ENCOUNTER — Encounter: Payer: Self-pay | Admitting: Internal Medicine

## 2016-12-10 ENCOUNTER — Ambulatory Visit: Payer: Self-pay | Attending: Internal Medicine | Admitting: Internal Medicine

## 2016-12-10 VITALS — BP 132/81 | HR 69 | Temp 98.2°F | Resp 16 | Wt 115.6 lb

## 2016-12-10 DIAGNOSIS — K219 Gastro-esophageal reflux disease without esophagitis: Secondary | ICD-10-CM | POA: Insufficient documentation

## 2016-12-10 DIAGNOSIS — M1711 Unilateral primary osteoarthritis, right knee: Secondary | ICD-10-CM | POA: Insufficient documentation

## 2016-12-10 DIAGNOSIS — H9313 Tinnitus, bilateral: Secondary | ICD-10-CM | POA: Insufficient documentation

## 2016-12-10 DIAGNOSIS — Z23 Encounter for immunization: Secondary | ICD-10-CM | POA: Insufficient documentation

## 2016-12-10 DIAGNOSIS — H9191 Unspecified hearing loss, right ear: Secondary | ICD-10-CM

## 2016-12-10 DIAGNOSIS — F329 Major depressive disorder, single episode, unspecified: Secondary | ICD-10-CM | POA: Insufficient documentation

## 2016-12-10 DIAGNOSIS — Z1211 Encounter for screening for malignant neoplasm of colon: Secondary | ICD-10-CM

## 2016-12-10 DIAGNOSIS — I10 Essential (primary) hypertension: Secondary | ICD-10-CM | POA: Insufficient documentation

## 2016-12-10 DIAGNOSIS — E785 Hyperlipidemia, unspecified: Secondary | ICD-10-CM | POA: Insufficient documentation

## 2016-12-10 DIAGNOSIS — R42 Dizziness and giddiness: Secondary | ICD-10-CM | POA: Insufficient documentation

## 2016-12-10 DIAGNOSIS — Z833 Family history of diabetes mellitus: Secondary | ICD-10-CM | POA: Insufficient documentation

## 2016-12-10 MED ORDER — MECLIZINE HCL 12.5 MG PO TABS: 12.5000 mg | ORAL_TABLET | Freq: Two times a day (BID) | ORAL | 0 refills | Status: DC | PRN

## 2016-12-10 NOTE — Progress Notes (Signed)
Patient ID: Tina Johnson, female    DOB: [birth year]  MRN: [id number]  CC: Referral   Subjective: [name] is a 51 y.o. female who presents for f/u dizziness and tinnitis Her concerns today include:  Pt with hx of HL, OA knee, HTN.  Patient was seen by our nurse practitioner several weeks ago for tinnitus and dizziness. She was referred to audiology. She reported feeling of falling to the left side with a pulsating tinnitus when eating or drinking. Audiology evaluation revealed unilateral high-frequency hearing loss on the right. Plan to repeat study in 3 mths and she recommended referral to ENT.  -pt stated that she has told the test is fine. Denies decrease hearing on one side or the other. -dizziness occurs about Q 4-7 days and is constant for days when it comes on. -worse with rapid head movement. Afraid to drive when episodes occur -dizziness associated with buzzing sound in ears and feeling of falling to LT side.  -intermittent feeling of numbness on LT and sometimes RT side of face that last secs to min No HA -last episode about 1.5 wk ago Patient Active Problem List   Diagnosis Date Noted  . S/P TKR (total knee replacement), bilateral 10/07/2016  . 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 Family history of diabetes mellitus (DM) 01/23/2014  . Right knee DJD 01/23/2014  . Hyperlipidemia 01/23/2014  . HYPERLIPIDEMIA 08/14/2007  . DEPRESSION 08/14/2007  . GERD 08/14/2007  . HEADACHE, CHRONIC 08/14/2007     Current Outpatient Prescriptions on File Prior to Visit  Medication Sig Dispense Refill  . acetaminophen (TYLENOL) 500 MG tablet Take 2 tablets (1,000 mg total) by mouth every 6 (six) hours as needed for headache. 30 tablet 0  . pravastatin (PRAVACHOL) 20 MG tablet Take 1 tablet (20 mg total) by mouth daily. 90 tablet 0   No current facility-administered medications on file prior to visit.     Allergies    Allergen Reactions  . Latex Rash    Social History   Social History  . Marital status: Married    Spouse name: N/A  . Number of children: N/A  . Years of education: N/A   Occupational History  . Not on file.   Social History Main Topics  . Smoking status: Never Smoker  . Smokeless tobacco: Never Used  . Alcohol use No  . Drug use: No  . Sexual activity: Not on file   Other Topics Concern  . Not on file   Social History Narrative  . No narrative on file    Family History  Problem Relation Age of Onset  . Diabetes Sister   . Diabetes Brother     Past Surgical History:  Procedure Laterality Date  . ABDOMINAL HYSTERECTOMY    . TOTAL ABDOMINAL HYSTERECTOMY W/ BILATERAL SALPINGOOPHORECTOMY    . TOTAL KNEE ARTHROPLASTY Right 04/03/2015   Procedure: RIGHT TOTAL KNEE ARTHROPLASTY;  Surgeon: Leandrew Koyanagi, MD;  Location: [facility];  Service: Orthopedics;  Laterality: Right;  . TOTAL KNEE ARTHROPLASTY Left 04/03/2015   Procedure: LEFT TOTAL KNEE ARTHROPLASTY;  Surgeon: Leandrew Koyanagi, MD;  Location: [facility];  Service: Orthopedics;  Laterality: Left;  Marland Kitchen VAGINAL DELIVERY     ?x2    ROS: Review of Systems Neg except as above  PHYSICAL EXAM: BP 132/81   Pulse 69   Temp 98.2 F (36.8 C) (Oral)   Resp 16   Wt 115 lb  9.6 oz (52.4 kg)   SpO2 96%   BMI 27.87 kg/m   142/70 sitting, 136/70 standing Physical Exam General appearance - alert, well appearing, and in no distress Mental status - alert, oriented to person, place, and time, normal mood, behavior, speech, dress, motor activity, and thought processes Mouth - mucous membranes moist, pharynx normal without lesions Neck - supple, no significant adenopathy Chest - clear to auscultation, no wheezes, rales or rhonchi, symmetric air entry Heart - normal rate, regular rhythm, normal S1, S2, no murmurs, rubs, clicks or gallops Neurological - cranial nerves II through XII intact, motor and sensory grossly normal bilaterally,  Romberg sign negative, normal gait and station   ASSESSMENT AND PLAN: 1. Tinnitus of both ears 2. High-frequency hearing loss of right ear - Ambulatory referral to ENT  3. Dizzinesses - Ambulatory referral to ENT - CT Head Wo Contrast; Future - meclizine (ANTIVERT) 12.5 MG tablet; Take 1 tablet (12.5 mg total) by mouth 2 (two) times daily as needed for dizziness.  Dispense: 30 tablet; Refill: 0  4. Need for influenza vaccination - Flu Vaccine QUAD 6+ mos PF IM (Fluarix Quad PF)  5. Colon cancer screening - Ambulatory referral to Gastroenterology  Patient was given the opportunity to ask questions.  Patient verbalized understanding of the plan and was able to repeat key elements of the plan.  Stratus interpreter used during this encounter.  Orders Placed This Encounter  Procedures  . CT Head Wo Contrast  . Flu Vaccine QUAD 6+ mos PF IM (Fluarix Quad PF)  . Ambulatory referral to Gastroenterology  . Ambulatory referral to ENT     Requested Prescriptions   Signed Prescriptions Disp Refills  . meclizine (ANTIVERT) 12.5 MG tablet 30 tablet 0    Sig: Take 1 tablet (12.5 mg total) by mouth 2 (two) times daily as needed for dizziness.    Return if symptoms worsen or fail to improve.  Karle Plumber, MD, FACP

## 2016-12-10 NOTE — Patient Instructions (Signed)
Influenza Virus Vaccine injection (Fluarix) Qu es este medicamento? La VACUNA ANTIGRIPAL ayuda a disminuir el riesgo de contraer la influenza, tambin conocida como la gripe. La vacuna solo ayuda a protegerle contra algunas cepas de influenza. Esta vacuna no ayuda a reducir el riesgo de contraer influenza pandmica H1N1. Este medicamento puede ser utilizado para otros usos; si tiene alguna pregunta consulte con su proveedor de atencin mdica o con su farmacutico. MARCAS COMUNES: Fluarix, Fluzone Qu le debo informar a mi profesional de la salud antes de tomar este medicamento? Necesita saber si usted presenta alguno de los siguientes problemas o situaciones: -trastorno de sangrado como hemofilia -fiebre o infeccin -sndrome de Guillain-Barre u otros problemas neurolgicos -problemas del sistema inmunolgico -infeccin por el virus de la inmunodeficiencia humana (VIH) o SIDA -niveles bajos de plaquetas en la sangre -esclerosis mltiple -una reaccin alrgica o inusual a las vacunas antigripales, a los huevos, protenas de pollo, al ltex, a la gentamicina, a otros medicamentos, alimentos, colorantes o conservantes -si est embarazada o buscando quedar embarazada -si est amamantando a un beb Cmo debo utilizar este medicamento? Esta vacuna se administra mediante inyeccin por va intramuscular. Lo administra un profesional de la salud. Recibir una copia de informacin escrita sobre la vacuna antes de cada vacuna. Asegrese de leer este folleto cada vez cuidadosamente. Este folleto puede cambiar con frecuencia. Hable con su pediatra para informarse acerca del uso de este medicamento en nios. Puede requerir atencin especial. Sobredosis: Pngase en contacto inmediatamente con un centro toxicolgico o una sala de urgencia si usted cree que haya tomado demasiado medicamento. ATENCIN: Este medicamento es solo para usted. No comparta este medicamento con nadie. Qu sucede si me olvido de  una dosis? No se aplica en este caso. Qu puede interactuar con este medicamento? -quimioterapia o radioterapia -medicamentos que suprimen el sistema inmunolgico, tales como etanercept, anakinra, infliximab y adalimumab -medicamentos que tratan o previenen cogulos sanguneos, como warfarina -fenitona -medicamentos esteroideos, como la prednisona o la cortisona -teofilina -vacunas Puede ser que esta lista no menciona todas las posibles interacciones. Informe a su profesional de la salud de todos los productos a base de hierbas, medicamentos de venta libre o suplementos nutritivos que est tomando. Si usted fuma, consume bebidas alcohlicas o si utiliza drogas ilegales, indqueselo tambin a su profesional de la salud. Algunas sustancias pueden interactuar con su medicamento. A qu debo estar atento al usar este medicamento? Informe a su mdico o a su profesional de la salud sobre todos los efectos secundarios que persistan despus de 3 das. Llame a su proveedor de atencin mdica si se presentan sntomas inusuales dentro de las 6 semanas posteriores a la vacunacin. Es posible que todava pueda contraer la gripe, pero la enfermedad no ser tan fuerte como normalmente. No puede contraer la gripe de esta vacuna. La vacuna antigripal no le protege contra resfros u otras enfermedades que pueden causar fiebre. Debe vacunarse cada ao. Qu efectos secundarios puedo tener al utilizar este medicamento? Efectos secundarios que debe informar a su mdico o a su profesional de la salud tan pronto como sea posible: -reacciones alrgicas como erupcin cutnea, picazn o urticarias, hinchazn de la cara, labios o lengua Efectos secundarios que, por lo general, no requieren atencin mdica (debe informarlos a su mdico o a su profesional de la salud si persisten o si son molestos): -fiebre -dolor de cabeza -molestias y dolores musculares -dolor, sensibilidad, enrojecimiento o hinchazn en el lugar de la  inyeccin -cansancio o debilidad Puede ser que esta lista no   menciona todos los posibles efectos secundarios. Comunquese a su mdico por asesoramiento mdico sobre los efectos secundarios. Usted puede informar los efectos secundarios a la FDA por telfono al 1-800-FDA-1088. Dnde debo guardar mi medicina? Esta vacuna se administra solamente en clnicas, farmacias, consultorio mdico u otro consultorio de un profesional de la salud y no necesitar guardarlo en su domicilio. ATENCIN: Este folleto es un resumen. Puede ser que no cubra toda la posible informacin. Si usted tiene preguntas acerca de esta medicina, consulte con su mdico, su farmacutico o su profesional de la salud.  2018 Elsevier/Gold Standard (2009-10-07 15:31:40)  

## 2016-12-17 ENCOUNTER — Ambulatory Visit: Payer: Self-pay | Attending: Internal Medicine

## 2016-12-17 ENCOUNTER — Ambulatory Visit (HOSPITAL_COMMUNITY)
Admission: RE | Admit: 2016-12-17 | Discharge: 2016-12-17 | Disposition: A | Discharge status: Home / Self Care | Payer: Self-pay | Source: Ambulatory Visit | Attending: Internal Medicine | Admitting: Internal Medicine

## 2016-12-17 DIAGNOSIS — R42 Dizziness and giddiness: Secondary | ICD-10-CM

## 2016-12-23 ENCOUNTER — Telehealth (INDEPENDENT_AMBULATORY_CARE_PROVIDER_SITE_OTHER): Payer: Self-pay

## 2016-12-23 NOTE — Telephone Encounter (Signed)
Patient came in today and states she is having leg numbness on the right side and feels like blood is not circulating. Please advise. Does she need to come in?

## 2016-12-23 NOTE — Telephone Encounter (Signed)
Yes she should come in to get checked

## 2016-12-24 ENCOUNTER — Encounter (INDEPENDENT_AMBULATORY_CARE_PROVIDER_SITE_OTHER): Payer: Self-pay | Admitting: Family

## 2016-12-24 ENCOUNTER — Ambulatory Visit (INDEPENDENT_AMBULATORY_CARE_PROVIDER_SITE_OTHER): Payer: Self-pay | Admitting: Family

## 2016-12-24 ENCOUNTER — Ambulatory Visit (INDEPENDENT_AMBULATORY_CARE_PROVIDER_SITE_OTHER): Payer: Self-pay

## 2016-12-24 DIAGNOSIS — M25561 Pain in right knee: Secondary | ICD-10-CM

## 2016-12-24 DIAGNOSIS — G8929 Other chronic pain: Secondary | ICD-10-CM

## 2016-12-24 DIAGNOSIS — M545 Low back pain: Secondary | ICD-10-CM

## 2016-12-24 MED ORDER — PREDNISONE 50 MG PO TABS: ORAL_TABLET | ORAL | 0 refills | Status: DC

## 2016-12-24 NOTE — Telephone Encounter (Signed)
This was the patient I asked you about she does not have appt since your at 100%

## 2016-12-27 ENCOUNTER — Ambulatory Visit (INDEPENDENT_AMBULATORY_CARE_PROVIDER_SITE_OTHER): Payer: Self-pay | Admitting: Family

## 2016-12-30 NOTE — Progress Notes (Signed)
Office Visit Note   Patient: Tina Johnson           Date of Birth: 09/13/1965           MRN: [id number] Visit Date: 12/24/2016              Requested by: Ladell Pier, MD [address] [place], [place] [address] PCP: Ladell Pier, MD  Chief Complaint  Patient presents with  . Right Leg - Follow-up      HPI: The patient is a 51 year old woman who presents today complaining of right anterior leg and knee pain. This is described as a burning pain states that it feels like her leg is asleep. She is status post right total knee is concerned this is related to her knee replacement. Does state she has intermittent low back pain in the past has not been concerned about this has typically related to strains related to a workout. No tingling no weakness no mechanical symptoms of her knee  Assessment & Plan: Visit Diagnoses:  1. Chronic pain of right knee   2. Chronic bilateral low back pain, with sciatica presence unspecified     Plan: will trial a prednisone taper for radiculopathy. Follow up in office in 4 weeks if no improvement.   Follow-Up Instructions: No Follow-up on file.   Right Knee Exam   Tenderness  The patient is experiencing no tenderness.     Range of Motion  The patient has normal right knee ROM.  Muscle Strength   The patient has normal right knee strength.  Tests  Varus: negative Valgus: negative  Other  Erythema: absent Swelling: none  Comments:  Mild tenderness to anterior compartment, lower extremity   Back Exam   Tenderness  The patient is experiencing tenderness in the lumbar and sacroiliac.  Range of Motion  The patient has normal back ROM.  Tests  Straight leg raise right: negative Straight leg raise left: negative      Patient is alert, oriented, no adenopathy, well-dressed, normal affect, normal respiratory effort.   Imaging: No results found. No images are attached to the encounter.  Labs: Lab  Results  Component Value Date   HGBA1C 5.0 01/21/2016   HGBA1C 5.3 01/29/2014   ESRSEDRATE 7 04/03/2015   CRP <0.5 04/03/2015    Orders:  Orders Placed This Encounter  Procedures  . XR Lumbar Spine 2-3 Views  . XR KNEE 3 VIEW RIGHT   Meds ordered this encounter  Medications  . predniSONE (DELTASONE) 50 MG tablet    Sig: Take one tablet once daily    Dispense:  5 tablet    Refill:  0     Procedures: No procedures performed  Clinical Data: No additional findings.  ROS:  All other systems negative, except as noted in the HPI. Review of Systems  Constitutional: Negative for chills and fever.  Cardiovascular: Negative for leg swelling.  Musculoskeletal: Positive for back pain and myalgias.  Neurological: Positive for numbness. Negative for weakness.    Objective: Vital Signs: There were no vitals taken for this visit.  Specialty Comments:  No specialty comments available.  PMFS History: Patient Active Problem List   Diagnosis Date Noted  . S/P TKR (total knee replacement), bilateral 10/07/2016  . 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 Family history of diabetes mellitus (DM) 01/23/2014  . Right knee DJD 01/23/2014  . Hyperlipidemia 01/23/2014  . HYPERLIPIDEMIA 08/14/2007  . DEPRESSION  08/14/2007  . GERD 08/14/2007  . HEADACHE, CHRONIC 08/14/2007   Past Medical History:  Diagnosis Date  . Anxiety    controls with exercise   . Arthritis    knees , shoulders  . Indigestion    no medicine at present    Family History  Problem Relation Age of Onset  . Diabetes Sister   . Diabetes Brother     Past Surgical History:  Procedure Laterality Date  . ABDOMINAL HYSTERECTOMY    . TOTAL ABDOMINAL HYSTERECTOMY W/ BILATERAL SALPINGOOPHORECTOMY    . TOTAL KNEE ARTHROPLASTY Right 04/03/2015   Procedure: RIGHT TOTAL KNEE ARTHROPLASTY;  Surgeon: Leandrew Koyanagi, MD;  Location: [facility];  Service: Orthopedics;  Laterality: Right;  .  TOTAL KNEE ARTHROPLASTY Left 04/03/2015   Procedure: LEFT TOTAL KNEE ARTHROPLASTY;  Surgeon: Leandrew Koyanagi, MD;  Location: [facility];  Service: Orthopedics;  Laterality: Left;  Marland Kitchen VAGINAL DELIVERY     ?x2   Social History   Occupational History  . Not on file.   Social History Main Topics  . Smoking status: Never Smoker  . Smokeless tobacco: Never Used  . Alcohol use No  . Drug use: No  . Sexual activity: Not on file

## 2017-01-03 ENCOUNTER — Encounter: Payer: Self-pay | Admitting: Internal Medicine

## 2017-01-11 ENCOUNTER — Encounter: Payer: Self-pay | Admitting: Internal Medicine

## 2017-01-11 ENCOUNTER — Ambulatory Visit: Payer: Self-pay | Attending: Internal Medicine | Admitting: Internal Medicine

## 2017-01-11 VITALS — BP 154/83 | HR 43 | Temp 97.6°F | Resp 16 | Wt 116.8 lb

## 2017-01-11 DIAGNOSIS — Z96653 Presence of artificial knee joint, bilateral: Secondary | ICD-10-CM | POA: Insufficient documentation

## 2017-01-11 DIAGNOSIS — Z79899 Other long term (current) drug therapy: Secondary | ICD-10-CM | POA: Insufficient documentation

## 2017-01-11 DIAGNOSIS — F329 Major depressive disorder, single episode, unspecified: Secondary | ICD-10-CM | POA: Insufficient documentation

## 2017-01-11 DIAGNOSIS — K219 Gastro-esophageal reflux disease without esophagitis: Secondary | ICD-10-CM | POA: Insufficient documentation

## 2017-01-11 DIAGNOSIS — E785 Hyperlipidemia, unspecified: Secondary | ICD-10-CM | POA: Insufficient documentation

## 2017-01-11 DIAGNOSIS — R42 Dizziness and giddiness: Secondary | ICD-10-CM | POA: Insufficient documentation

## 2017-01-11 DIAGNOSIS — I10 Essential (primary) hypertension: Secondary | ICD-10-CM | POA: Insufficient documentation

## 2017-01-11 DIAGNOSIS — M1710 Unilateral primary osteoarthritis, unspecified knee: Secondary | ICD-10-CM | POA: Insufficient documentation

## 2017-01-11 DIAGNOSIS — H9191 Unspecified hearing loss, right ear: Secondary | ICD-10-CM

## 2017-01-11 NOTE — Progress Notes (Signed)
Patient ID: Tina Johnson, female    DOB: [birth year]  MRN: [id number]  CC:   Subjective:  Tina Johnson is a 51 y.o. female who presents for UC visit. Her concerns today include:  Pt with hx of HL, OA knee, HTN (benign, being observed off med).   Patient saw me a month ago for tinnitus and dizziness. She was referred to audiology. She reported feeling of falling to the left side with a pulsating tinnitus when eating or drinking. Audiology evaluation revealed unilateral high-frequency hearing loss on the right. Plan to repeat study in 3 mths.  Patient also complained of intermittent feeling of numbness on the left and sometime right side of the face that lasts seconds to minutes. She was referred to ENT, CAT scan of the head was negative and she was started on meclizine to use when necessary.   Today: 1. Dizziness has improved.She has only had to use Meclizine x 2 since last visit.  -She has not received appt with ENT as yet  2. HTN: -checks BP a few times a wk. 4 days ago it was 126/69.   3. Has appt for colonoscopy 03/18/2017 Patient Active Problem List   Diagnosis Date Noted  . S/P TKR (total knee replacement), bilateral 10/07/2016  . 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 Family history of diabetes mellitus (DM) 01/23/2014  . Right knee DJD 01/23/2014  . Hyperlipidemia 01/23/2014  . HYPERLIPIDEMIA 08/14/2007  . DEPRESSION 08/14/2007  . GERD 08/14/2007  . HEADACHE, CHRONIC 08/14/2007     Current Outpatient Prescriptions on File Prior to Visit  Medication Sig Dispense Refill  . meclizine (ANTIVERT) 12.5 MG tablet Take 1 tablet (12.5 mg total) by mouth 2 (two) times daily as needed for dizziness. 30 tablet 0  . pravastatin (PRAVACHOL) 20 MG tablet Take 1 tablet (20 mg total) by mouth daily. 90 tablet 0  . acetaminophen (TYLENOL) 500 MG tablet Take 2 tablets (1,000 mg total) by mouth every 6 (six) hours as needed for  headache. (Patient not taking: Reported on 01/11/2017) 30 tablet 0   No current facility-administered medications on file prior to visit.     Allergies  Allergen Reactions  . Latex Rash     ROS: Review of Systems Neg except as stated above PHYSICAL EXAM: BP (!) 154/83   Pulse (!) 43   Temp 97.6 F (36.4 C) (Oral)   Resp 16   Wt 116 lb 12.8 oz (53 kg)   SpO2 97%   BMI 28.16 kg/m   145/70 Physical Exam  General appearance - alert, well appearing, and in no distress Mental status - alert, oriented to person, place, and time, normal mood, behavior, speech, dress, motor activity, and thought processes Chest - clear to auscultation, no wheezes, rales or rhonchi, symmetric air entry Heart - normal rate, regular rhythm, normal S1, S2, no murmurs, rubs, clicks or gallops Extremities - peripheral pulses normal, no pedal edema, no clubbing or cyanosis  ASSESSMENT AND PLAN: 1. Dizzinesses Improved. Continue Meclizine PRN Await ENT appt 2. High-frequency hearing loss of right ear -await ENT appt  3. Hypertension, benign -pt to continue to monitor BP at home 2 x a wk with goal being 140/90 or lower.  Patient was given the opportunity to ask questions.  Patient verbalized understanding of the plan and was able to repeat key elements of the plan.  Stratus interpreter used during this encounter.  No orders of the defined types  were placed in this encounter.    Requested Prescriptions    No prescriptions requested or ordered in this encounter    Future Appointments Date Time Provider [place]  02/24/2017 8:45 AM Leandrew Koyanagi, MD [facility] None  03/01/2017 11:15 AM Fredonia Highland, AUD [facility] None  03/03/2017 10:00 AM [facility] PREVISIT [facility]  03/17/2017 11:30 AM Irene Shipper, MD [facility]    Karle Plumber, MD, FACP

## 2017-02-22 ENCOUNTER — Ambulatory Visit: Payer: Self-pay | Admitting: Audiology

## 2017-02-24 ENCOUNTER — Ambulatory Visit (INDEPENDENT_AMBULATORY_CARE_PROVIDER_SITE_OTHER): Payer: Self-pay

## 2017-02-24 ENCOUNTER — Encounter (INDEPENDENT_AMBULATORY_CARE_PROVIDER_SITE_OTHER): Payer: Self-pay | Admitting: Orthopaedic Surgery

## 2017-02-24 ENCOUNTER — Ambulatory Visit (INDEPENDENT_AMBULATORY_CARE_PROVIDER_SITE_OTHER): Payer: Self-pay | Admitting: Orthopaedic Surgery

## 2017-02-24 DIAGNOSIS — M1711 Unilateral primary osteoarthritis, right knee: Secondary | ICD-10-CM

## 2017-02-24 DIAGNOSIS — M1712 Unilateral primary osteoarthritis, left knee: Secondary | ICD-10-CM

## 2017-02-24 NOTE — Progress Notes (Signed)
   Office Visit Note   Patient: Tina Johnson           Date of Birth: Nov 19, 1965           MRN: [id number] Visit Date: 02/24/2017              Requested by: Maren Reamer, MD No address on file PCP: Ladell Pier, MD   Assessment & Plan: Visit Diagnoses:  1. Primary osteoarthritis of right knee   2. Primary osteoarthritis of left knee     Plan: Patient is doing well 2 years status post bilateral knee replacements.  Continue activity as tolerated.  Follow-up in 3 years for her 5-year visit with 2 view x-rays of both knees.  Follow-up sooner if there is any issues.  Follow-Up Instructions: Return in about 3 years (around 02/25/2020).   Orders:  Orders Placed This Encounter  Procedures  . XR KNEE 3 VIEW LEFT  . XR KNEE 3 VIEW RIGHT   No orders of the defined types were placed in this encounter.     Procedures: No procedures performed   Clinical Data: No additional findings.   Subjective: Chief Complaint  Patient presents with  . Right Knee - Pain  . Left Knee - Pain    Patient is 2 years status post bilateral total knee replacements.  She is doing well and doing exercises at home and in the gym.  She is very happy with her progress and her knee replacements.  She has no complaints.    Review of Systems   Objective: Vital Signs: There were no vitals taken for this visit.  Physical Exam  Ortho Exam Bilateral knee exam shows fully healed surgical scars.  Good range of motion.  Benign exam otherwise. Specialty Comments:  No specialty comments available.  Imaging: Xr Knee 3 View Left  Result Date: 02/24/2017 Stable left total knee replacement in good alignment  Xr Knee 3 View Right  Result Date: 02/24/2017 Stable right total knee replacement in good alignment    PMFS History: Patient Active Problem List   Diagnosis Date Noted  . S/P TKR (total knee replacement), bilateral 10/07/2016  . 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 Genu varum of  both lower extremities 03/07/2014  . Family history of diabetes mellitus (DM) 01/23/2014  . Right knee DJD 01/23/2014  . Hyperlipidemia 01/23/2014  . HYPERLIPIDEMIA 08/14/2007  . DEPRESSION 08/14/2007  . GERD 08/14/2007  . HEADACHE, CHRONIC 08/14/2007   Past Medical History:  Diagnosis Date  . Anxiety    controls with exercise   . Arthritis    knees , shoulders  . Indigestion    no medicine at present    Family History  Problem Relation Age of Onset  . Diabetes Sister   . Diabetes Brother     Past Surgical History:  Procedure Laterality Date  . ABDOMINAL HYSTERECTOMY    . TOTAL ABDOMINAL HYSTERECTOMY W/ BILATERAL SALPINGOOPHORECTOMY    . VAGINAL DELIVERY     ?x2   Social History   Occupational History  . Not on file  Tobacco Use  . Smoking status: Never Smoker  . Smokeless tobacco: Never Used  Substance and Sexual Activity  . Alcohol use: No  . Drug use: No  . Sexual activity: Not on file

## 2017-03-01 ENCOUNTER — Ambulatory Visit: Payer: Self-pay | Attending: Family Medicine | Admitting: Audiology

## 2017-03-01 DIAGNOSIS — H748X2 Other specified disorders of left middle ear and mastoid: Secondary | ICD-10-CM

## 2017-03-01 DIAGNOSIS — H9191 Unspecified hearing loss, right ear: Secondary | ICD-10-CM

## 2017-03-01 DIAGNOSIS — H748X9 Other specified disorders of middle ear and mastoid, unspecified ear: Secondary | ICD-10-CM

## 2017-03-01 DIAGNOSIS — Z01118 Encounter for examination of ears and hearing with other abnormal findings: Secondary | ICD-10-CM | POA: Insufficient documentation

## 2017-03-01 DIAGNOSIS — R292 Abnormal reflex: Secondary | ICD-10-CM | POA: Insufficient documentation

## 2017-03-01 DIAGNOSIS — R94128 Abnormal results of other function studies of ear and other special senses: Secondary | ICD-10-CM

## 2017-03-01 NOTE — Procedures (Signed)
[facility]  [place], [place] [address]  [phone]   Audiological Evaluation  Patient Name: Emmely Bittinger     Status: Outpatient      DOB: 07/29/1965                                              Diagnosis: Tinnitus Bilateral, Unsteadiness MRN: [id number] Date:  03/01/2017                                           Referent: Ladell Pier, MD  History: Sari Cogan was seen for a repeat audiological evaluation because of a) right unilateral hearing loss of 70 dBHL at 8000Hz  b) vertigo that "spins toward the left for 4 months" with a feeling that she will "fall down to the left".  and c) tinnitus. She was previously seen here on 11/25/2016. Since that time May states that she was prescribed "meclizine" for the vertigo which really helped. She "has not had vertigo for about 2 months".  Her tinnitus is also "much better".  Prior to this past even "everything was fine". Information was gathered with a Spanish interpreter. Pain: None.  History of ear infections:  N History of dizziness/vertigo:   Y - prior to last visit here. No vertigo reported for the past 79months History of balance issues:  Y- prior to the last visit here. No balance issues reported now.  Tinnitus: Y - prior to last visit here. Tinnitus intermittent and much "quieter now". No concerns.  History of diabetes: N    Evaluation: Conventional pure tone audiometry from 250Hz  - 8000Hz  with using insert earphones.  Hearing Thresholds shows symmetrical hearing thresholds from 250Hz  - 4000Hz  of 10-20 dBHL; 30 dBHL ont he right and 10 dBHL on the left at 6000Hz  and at 8000Hz  30 dBHL on the left and 60 dBHL on the right. Note: Masking was used when testing 8000Hz  on the right side.   Reliability is good Speech reception levels (repeating words near threshold) using recorded spondee word lists:   Right ear: 15 dBHL.   Left ear:  15 dBHL Word  recognition (at comfortably loud volumes) using recorded SPANISH word lists at 55 dBHL, in quiet.   Right ear: 96%.   Left ear:   100% Tympanometry (middle ear function) shows normal middle ear volume and pressure with abnormal absent ipsilateral acoustic reflexes from 500Hz  - 4000Hz  bilaterally.   Right ear: Normal compliance (Type A).   Left ear: shallow compliance (Type As).  CONCLUSION:      Aysa's prior concerns about vertigo have improved- she reports no vertigo or balance issues for the past 2 months. Anwitha states that the tinnitus is also much improved - much softer in volume with only rare occurrence now. Jodell states that the tinnitus is not bothersome. The asymmetrical hearing loss on the right side is consistent to slightly improved compared to the previous results. The middle ear function results are also stable with the left tympanogram slightly shallow.   Bernarda Azobacter continues to have has normal hearing thresholds from 250Hz  - 4000Hz  with a borderline mild high frequency hearing loss on the left side and a moderate to severe hearing loss on the right side. Word recognition is  excellent at normal conversational speech levels using a Spanish recording of word lists in quiet. Keyshawna Razo-Castro was counseled regarding today's test results. She was advised to call Ladell Pier, MD if symptoms return.    RECOMMENDATIONS: 1.   Monitor hearing with a repeat audiological evaluation in 6-12 months (earlier if there is any change in hearing or ear pressure).   Deborah L. [name], Au.D., CCC-A Doctor of Audiology cc: Ladell Pier, MD

## 2017-03-03 ENCOUNTER — Ambulatory Visit (AMBULATORY_SURGERY_CENTER): Payer: Self-pay

## 2017-03-03 VITALS — Ht <= 58 in | Wt 120.0 lb

## 2017-03-03 DIAGNOSIS — Z1211 Encounter for screening for malignant neoplasm of colon: Secondary | ICD-10-CM

## 2017-03-03 MED ORDER — SUPREP BOWEL PREP KIT 17.5-3.13-1.6 GM/177ML PO SOLN: 1.0000 | Freq: Once | ORAL | 0 refills | Status: AC

## 2017-03-03 NOTE — Progress Notes (Signed)
No allergies to eggs or soy No past problems with anesthesia except one isolated event in [place] No diet meds No home oxygen  Registered emmi

## 2017-03-14 ENCOUNTER — Ambulatory Visit: Payer: Self-pay | Attending: Internal Medicine

## 2017-03-17 ENCOUNTER — Encounter: Payer: Self-pay | Admitting: Internal Medicine

## 2017-03-17 ENCOUNTER — Ambulatory Visit (AMBULATORY_SURGERY_CENTER): Payer: Self-pay | Admitting: Internal Medicine

## 2017-03-17 VITALS — BP 127/61 | HR 43 | Temp 98.0°F | Resp 11 | Ht <= 58 in | Wt 116.0 lb

## 2017-03-17 DIAGNOSIS — Z1211 Encounter for screening for malignant neoplasm of colon: Secondary | ICD-10-CM

## 2017-03-17 DIAGNOSIS — K388 Other specified diseases of appendix: Secondary | ICD-10-CM

## 2017-03-17 DIAGNOSIS — D120 Benign neoplasm of cecum: Secondary | ICD-10-CM

## 2017-03-17 MED ORDER — SODIUM CHLORIDE 0.9 % IV SOLN: 500.0000 mL | INTRAVENOUS | Status: DC

## 2017-03-17 NOTE — Op Note (Signed)
[facility] Patient Name: Tina Johnson Procedure Date: 03/17/2017 1:06 PM MRN: [id number] Endoscopist: Docia Chuck. Henrene Pastor , MD Age: 51 Referring MD:  Date of Birth: Jun 14, 1965 Gender: Female Account #: [id number] Procedure:                Colonoscopy, With cold snare polypectomy; with                            biopsies Indications:              Screening for colorectal malignant neoplasm.                            Previous examination April 2008 was normal Medicines:                Monitored Anesthesia Care Procedure:                Pre-Anesthesia Assessment:                           - Prior to the procedure, a History and Physical                            was performed, and patient medications and                            allergies were reviewed. The patient's tolerance of                            previous anesthesia was also reviewed. The risks                            and benefits of the procedure and the sedation                            options and risks were discussed with the patient.                            All questions were answered, and informed consent                            was obtained. Prior Anticoagulants: The patient has                            taken no previous anticoagulant or antiplatelet                            agents. ASA Grade Assessment: II - A patient with                            mild systemic disease. After reviewing the risks                            and benefits, the patient was deemed in  satisfactory condition to undergo the procedure.                           After obtaining informed consent, the colonoscope                            was passed under direct vision. Throughout the                            procedure, the patient's blood pressure, pulse, and                            oxygen saturations were monitored continuously. The                            Colonoscope was  introduced through the anus and                            advanced to the the cecum, identified by                            appendiceal orifice and ileocecal valve. The                            ileocecal valve, appendiceal orifice, and rectum                            were photographed. The quality of the bowel                            preparation was excellent. The colonoscopy was                            performed without difficulty. The patient tolerated                            the procedure well. The bowel preparation used was                            SUPREP. Scope In: 1:12:59 PM Scope Out: 1:29:52 PM Scope Withdrawal Time: 0 hours 13 minutes 36 seconds  Total Procedure Duration: 0 hours 16 minutes 53 seconds  Findings:                 A 2 mm polyp was found in the cecum. The polyp was                            removed with a cold snare. Resection and retrieval                            were complete.                           A submucosal mass was found appendiceal orifice.  This was Tunnel biopsied with a cold forceps for                            histology.                           Internal hemorrhoids were found during                            retroflexion. The hemorrhoids were small.                           The exam was otherwise without abnormality on                            direct and retroflexion views. Complications:            No immediate complications. Estimated blood loss:                            None. Estimated Blood Loss:     Estimated blood loss: none. Impression:               - One 2 mm polyp in the cecum, removed with a cold                            snare. Resected and retrieved.                           - Submucosal lesion at the appendiceal orifice.                            Biopsied.                           - Internal hemorrhoids.                           - The examination was otherwise normal on  direct                            and retroflexion views. Recommendation:           - Repeat colonoscopy date to be determined after                            pending pathology results are reviewed for                            surveillance.                           - Patient has a contact number available for                            emergencies. The signs and symptoms of potential  delayed complications were discussed with the                            patient. Return to normal activities tomorrow.                            Written discharge instructions were provided to the                            patient.                           - Resume previous diet.                           - Continue present medications.                           - Await pathology results. If diminutive polyp                            adenomatous then recall colonoscopy 5 years.                            Otherwise 10 years. Depending upon biopsy results                            from the appendiceal orifice may need CT scan                            and/or surgical resection. Docia Chuck. Henrene Pastor, MD 03/17/2017 1:37:25 PM This report has been signed electronically.

## 2017-03-17 NOTE — Progress Notes (Signed)
Called to room to assist during endoscopic procedure.  Patient ID and intended procedure confirmed with present staff. Received instructions for my participation in the procedure from the performing physician.  

## 2017-03-17 NOTE — Progress Notes (Signed)
Report to PACU, RN, vss, BBS= Clear.  

## 2017-03-17 NOTE — Patient Instructions (Signed)
Handout given on polyps  YOU HAD AN ENDOSCOPIC PROCEDURE TODAY AT THE [facility]:   Refer to the procedure report that was given to you for any specific questions about what was found during the examination.  If the procedure report does not answer your questions, please call your gastroenterologist to clarify.  If you requested that your care partner not be given the details of your procedure findings, then the procedure report has been included in a sealed envelope for you to review at your convenience later.  YOU SHOULD EXPECT: Some feelings of bloating in the abdomen. Passage of more gas than usual.  Walking can help get rid of the air that was put into your GI tract during the procedure and reduce the bloating. If you had a lower endoscopy (such as a colonoscopy or flexible sigmoidoscopy) you may notice spotting of blood in your stool or on the toilet paper. If you underwent a bowel prep for your procedure, you may not have a normal bowel movement for a few days.  Please Note:  You might notice some irritation and congestion in your nose or some drainage.  This is from the oxygen used during your procedure.  There is no need for concern and it should clear up in a day or so.  SYMPTOMS TO REPORT IMMEDIATELY:   Following lower endoscopy (colonoscopy or flexible sigmoidoscopy):  Excessive amounts of blood in the stool  Significant tenderness or worsening of abdominal pains  Swelling of the abdomen that is new, acute  Fever of 100F or higher    For urgent or emergent issues, a gastroenterologist can be reached at any hour by calling [phone].   DIET:  We do recommend a small meal at first, but then you may proceed to your regular diet.  Drink plenty of fluids but you should avoid alcoholic beverages for 24 hours.  ACTIVITY:  You should plan to take it easy for the rest of today and you should NOT DRIVE or use heavy machinery until tomorrow (because of the sedation  medicines used during the test).    FOLLOW UP: Our staff will call the number listed on your records the next business day following your procedure to check on you and address any questions or concerns that you may have regarding the information given to you following your procedure. If we do not reach you, we will leave a message.  However, if you are feeling well and you are not experiencing any problems, there is no need to return our call.  We will assume that you have returned to your regular daily activities without incident.  If any biopsies were taken you will be contacted by phone or by letter within the next 1-3 weeks.  Please call us at [phone] if you have not heard about the biopsies in 3 weeks.    SIGNATURES/CONFIDENTIALITY: You and/or your care partner have signed paperwork which will be entered into your electronic medical record.  These signatures attest to the fact that that the information above on your After Visit Summary has been reviewed and is understood.  Full responsibility of the confidentiality of this discharge information lies with you and/or your care-partner. 

## 2017-03-18 ENCOUNTER — Telehealth: Payer: Self-pay | Admitting: *Deleted

## 2017-03-18 NOTE — Telephone Encounter (Signed)
Talked with Waldo Laine RN. Her impression is that the patient is not having any issues currently. Noted

## 2017-03-24 ENCOUNTER — Encounter: Payer: Self-pay | Admitting: Internal Medicine

## 2017-03-24 ENCOUNTER — Other Ambulatory Visit: Payer: Self-pay

## 2017-03-24 DIAGNOSIS — D373 Neoplasm of uncertain behavior of appendix: Secondary | ICD-10-CM

## 2017-03-25 ENCOUNTER — Telehealth: Payer: Self-pay | Admitting: Internal Medicine

## 2017-03-25 NOTE — Telephone Encounter (Signed)
Pt states she is having severe abdominal pain that is very tender to touch and it the pain is going into her back. Pt wanted to know if she should just wait until Monday to have CT done of if she should go to the ER. Pt instructed to go to the ER if the pain was severe.

## 2017-03-26 ENCOUNTER — Emergency Department (HOSPITAL_COMMUNITY): Payer: Self-pay

## 2017-03-26 ENCOUNTER — Emergency Department (HOSPITAL_COMMUNITY)
Admission: EM | Admit: 2017-03-26 | Discharge: 2017-03-26 | Disposition: A | Discharge status: Home / Self Care | Payer: Self-pay | Attending: Emergency Medicine | Admitting: Emergency Medicine

## 2017-03-26 ENCOUNTER — Encounter (HOSPITAL_COMMUNITY): Payer: Self-pay | Admitting: Emergency Medicine

## 2017-03-26 DIAGNOSIS — F329 Major depressive disorder, single episode, unspecified: Secondary | ICD-10-CM | POA: Insufficient documentation

## 2017-03-26 DIAGNOSIS — F419 Anxiety disorder, unspecified: Secondary | ICD-10-CM | POA: Insufficient documentation

## 2017-03-26 DIAGNOSIS — Z96653 Presence of artificial knee joint, bilateral: Secondary | ICD-10-CM | POA: Insufficient documentation

## 2017-03-26 DIAGNOSIS — Z79899 Other long term (current) drug therapy: Secondary | ICD-10-CM | POA: Insufficient documentation

## 2017-03-26 DIAGNOSIS — Z91040 Latex allergy status: Secondary | ICD-10-CM | POA: Insufficient documentation

## 2017-03-26 DIAGNOSIS — R1030 Lower abdominal pain, unspecified: Secondary | ICD-10-CM | POA: Insufficient documentation

## 2017-03-26 LAB — URINALYSIS, ROUTINE W REFLEX MICROSCOPIC
BILIRUBIN URINE: NEGATIVE
Glucose, UA: NEGATIVE mg/dL
KETONES UR: NEGATIVE mg/dL
Leukocytes, UA: NEGATIVE
NITRITE: NEGATIVE
PROTEIN: NEGATIVE mg/dL
SPECIFIC GRAVITY, URINE: 1.003 — AB (ref 1.005–1.030)
pH: 6 (ref 5.0–8.0)

## 2017-03-26 LAB — COMPREHENSIVE METABOLIC PANEL
ALT: 21 U/L (ref 14–54)
ANION GAP: 9 (ref 5–15)
AST: 26 U/L (ref 15–41)
Albumin: 4 g/dL (ref 3.5–5.0)
Alkaline Phosphatase: 90 U/L (ref 38–126)
BILIRUBIN TOTAL: 0.6 mg/dL (ref 0.3–1.2)
BUN: 20 mg/dL (ref 6–20)
CO2: 26 mmol/L (ref 22–32)
Calcium: 9.7 mg/dL (ref 8.9–10.3)
Chloride: 103 mmol/L (ref 101–111)
Creatinine, Ser: 0.49 mg/dL (ref 0.44–1.00)
GFR calc Af Amer: >60 mL/min (ref >60)
GFR calc non Af Amer: >60 mL/min (ref >60)
GLUCOSE: 93 mg/dL (ref 65–99)
POTASSIUM: 4.2 mmol/L (ref 3.5–5.1)
SODIUM: 138 mmol/L (ref 135–145)
TOTAL PROTEIN: 7.6 g/dL (ref 6.5–8.1)

## 2017-03-26 LAB — LIPASE, BLOOD: Lipase: 30 U/L (ref 11–51)

## 2017-03-26 LAB — CBC
HEMATOCRIT: 40.4 % (ref 36.0–46.0)
HEMOGLOBIN: 13.7 g/dL (ref 12.0–15.0)
MCH: 30.5 pg (ref 26.0–34.0)
MCHC: 33.9 g/dL (ref 30.0–36.0)
MCV: 90 fL (ref 78.0–100.0)
Platelets: 281 10*3/uL (ref 150–400)
RBC: 4.49 MIL/uL (ref 3.87–5.11)
RDW: 13.4 % (ref 11.5–15.5)
WBC: 8.7 10*3/uL (ref 4.0–10.5)

## 2017-03-26 LAB — I-STAT BETA HCG BLOOD, ED (MC, WL, AP ONLY)

## 2017-03-26 MED ORDER — IOPAMIDOL (ISOVUE-300) INJECTION 61%
INTRAVENOUS | Status: AC
  Administered 2017-03-26: 30 mL via ORAL
  Filled 2017-03-26: qty 30

## 2017-03-26 MED ORDER — IOPAMIDOL (ISOVUE-300) INJECTION 61%: 30.0000 mL | Freq: Once | INTRAVENOUS | Status: DC

## 2017-03-26 MED ORDER — ONDANSETRON 4 MG PO TBDP: ORAL_TABLET | ORAL | 0 refills | Status: DC

## 2017-03-26 MED ORDER — IOPAMIDOL (ISOVUE-300) INJECTION 61%
INTRAVENOUS | Status: AC
  Administered 2017-03-26: 100 mL via INTRAVENOUS
  Filled 2017-03-26: qty 100

## 2017-03-26 MED ORDER — TRAMADOL HCL 50 MG PO TABS: 50.0000 mg | ORAL_TABLET | Freq: Four times a day (QID) | ORAL | 0 refills | Status: DC | PRN

## 2017-03-26 NOTE — ED Provider Notes (Signed)
[facility] DEPT Provider Note   CSN: [id number] Arrival date & time: 03/26/17  1311     History   Chief Complaint Chief Complaint  Patient presents with  . Abdominal Pain    HPI Tina Johnson is a 51 y.o. female.  Patient complains of abdominal pain.  Patient had a colonoscopy this week and it was decided to get a CT scan to evaluate a mass near her appendix   The history is provided by the patient. No language interpreter was used.  Abdominal Pain   This is a new problem. The current episode started more than 1 week ago. The problem occurs constantly. The problem has not changed since onset.The pain is associated with an unknown factor. The pain is located in the generalized abdominal region. The quality of the pain is dull. The pain is at a severity of 6/10. The pain is moderate. Pertinent negatives include anorexia, diarrhea, frequency, hematuria and headaches.    Past Medical History:  Diagnosis Date  . Anxiety    controls with exercise   . Arthritis    knees , shoulders  . Indigestion    no medicine at present    Patient Active Problem List   Diagnosis Date Noted  . S/P TKR (total knee replacement), bilateral 10/07/2016  . 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 Family history of diabetes mellitus (DM) 01/23/2014  . Right knee DJD 01/23/2014  . Hyperlipidemia 01/23/2014  . HYPERLIPIDEMIA 08/14/2007  . DEPRESSION 08/14/2007  . GERD 08/14/2007  . HEADACHE, CHRONIC 08/14/2007    Past Surgical History:  Procedure Laterality Date  . ABDOMINAL HYSTERECTOMY    . TOTAL ABDOMINAL HYSTERECTOMY W/ BILATERAL SALPINGOOPHORECTOMY    . TOTAL KNEE ARTHROPLASTY Right 04/03/2015   Procedure: RIGHT TOTAL KNEE ARTHROPLASTY;  Surgeon: Leandrew Koyanagi, MD;  Location: [facility];  Service: Orthopedics;  Laterality: Right;  . TOTAL KNEE ARTHROPLASTY Left 04/03/2015   Procedure: LEFT TOTAL KNEE ARTHROPLASTY;   Surgeon: Leandrew Koyanagi, MD;  Location: [facility];  Service: Orthopedics;  Laterality: Left;  Marland Kitchen VAGINAL DELIVERY     ?x2    OB History    No data available       Home Medications    Prior to Admission medications   Medication Sig Start Date End Date Taking? Authorizing Provider  meclizine (ANTIVERT) 12.5 MG tablet Take 1 tablet (12.5 mg total) by mouth 2 (two) times daily as needed for dizziness. Patient not taking: Reported on 03/03/2017 12/10/16   Ladell Pier, MD  ondansetron (ZOFRAN ODT) 4 MG disintegrating tablet 4mg  ODT q4 hours prn nausea/vomit 03/26/17   Milton Ferguson, MD  pravastatin (PRAVACHOL) 20 MG tablet Take 1 tablet (20 mg total) by mouth daily. 11/01/16   Alfonse Spruce, FNP  traMADol (ULTRAM) 50 MG tablet Take 1 tablet (50 mg total) by mouth every 6 (six) hours as needed. 03/26/17   Milton Ferguson, MD    Family History Family History  Problem Relation Age of Onset  . Diabetes Sister   . Diabetes Brother   . Colon cancer Neg Hx     Social History Social History   Tobacco Use  . Smoking status: Never Smoker  . Smokeless tobacco: Never Used  Substance Use Topics  . Alcohol use: No  . Drug use: No     Allergies   Latex   Review of Systems Review of Systems  Constitutional: Negative for appetite change  and fatigue.  HENT: Negative for congestion, ear discharge and sinus pressure.   Eyes: Negative for discharge.  Respiratory: Negative for cough.   Cardiovascular: Negative for chest pain.  Gastrointestinal: Positive for abdominal pain. Negative for anorexia and diarrhea.  Genitourinary: Negative for frequency and hematuria.  Musculoskeletal: Negative for back pain.  Skin: Negative for rash.  Neurological: Negative for seizures and headaches.  Psychiatric/Behavioral: Negative for hallucinations.     Physical Exam Updated Vital Signs BP 140/62 (BP Location: Left Arm)   Pulse (!) 51   Temp 98 F (36.7 C) (Oral)   Resp 20   SpO2 100%    Physical Exam  Constitutional: She is oriented to person, place, and time. She appears well-developed.  HENT:  Head: Normocephalic.  Eyes: Conjunctivae and EOM are normal. No scleral icterus.  Neck: Neck supple. No thyromegaly present.  Cardiovascular: Normal rate and regular rhythm. Exam reveals no gallop and no friction rub.  No murmur heard. Pulmonary/Chest: No stridor. She has no wheezes. She has no rales. She exhibits no tenderness.  Abdominal: She exhibits no distension. There is tenderness. There is no rebound.  Musculoskeletal: Normal range of motion. She exhibits no edema.  Lymphadenopathy:    She has no cervical adenopathy.  Neurological: She is oriented to person, place, and time. She exhibits normal muscle tone. Coordination normal.  Skin: No rash noted. No erythema.  Psychiatric: She has a normal mood and affect. Her behavior is normal.     ED Treatments / Results  Labs (all labs ordered are listed, but only abnormal results are displayed) Labs Reviewed  URINALYSIS, ROUTINE W REFLEX MICROSCOPIC - Abnormal; Notable for the following components:      Result Value   Color, Urine STRAW (*)    Specific Gravity, Urine 1.003 (*)    Hgb urine dipstick SMALL (*)    Bacteria, UA RARE (*)    Squamous Epithelial / LPF 0-5 (*)    All other components within normal limits  LIPASE, BLOOD  COMPREHENSIVE METABOLIC PANEL  CBC  I-STAT BETA HCG BLOOD, ED ([facility], [facility], [facility] ONLY)    EKG  EKG Interpretation None       Radiology Ct Abdomen Pelvis W Contrast  Result Date: 03/26/2017 CLINICAL DATA:  Abdominal pain and distention, abnormal colonoscopy question periappendiceal mass EXAM: CT ABDOMEN AND PELVIS WITH CONTRAST TECHNIQUE: Multidetector CT imaging of the abdomen and pelvis was performed using the standard protocol following bolus administration of intravenous contrast. Sagittal and coronal MPR images reconstructed from axial data set. CONTRAST:  100 ML ISOVUE-300 IOPAMIDOL  (ISOVUE-300) INJECTION 61% IV. Dilute oral contrast. COMPARISON:  None FINDINGS: Lower chest: Lung bases clear Hepatobiliary: Gallbladder and liver normal appearance Pancreas: Normal appearance Spleen: Normal appearance Adrenals/Urinary Tract: Adrenal glands, kidneys, ureters, and bladder normal appearance Stomach/Bowel: Fluid attenuation mass with a slightly thickened wall at the position of the appendix, approximately 2.9 cm diameter and 6.2 cm length. No surrounding inflammatory changes. 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 Stomach and bowel loops otherwise normal appearance. Vascular/Lymphatic: Iliac artery calcifications. Aorta normal caliber. No adenopathy. Reproductive: Uterus surgically absent. Nonvisualization of ovaries. Other: No free air or free fluid. No hernia or acute inflammatory process. Musculoskeletal: Unremarkable IMPRESSION: 2.9 cm diameter by 6.2 cm length tubular fluid attenuation structure at the expected position of the appendix without surrounding infiltrative changes. 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 Remainder of exam unremarkable. Electronically Signed  By: Lavonia Dana M.D.   On: 03/26/2017 17:41    Procedures Procedures (including critical care time)  Medications Ordered in ED Medications  iopamidol (ISOVUE-300) 61 % injection 30 mL (not administered)  iopamidol (ISOVUE-300) 61 % injection (30 mLs Oral Contrast Given 03/26/17 1536)  iopamidol (ISOVUE-300) 61 % injection (100 mLs Intravenous Contrast Given 03/26/17 1719)     Initial Impression / Assessment and Plan / ED Course  I have reviewed the triage vital signs and the nursing notes.  Pertinent labs & imaging results that were available during my care of the patient were reviewed by me and considered in my medical decision making (see chart for details).   Patient with abdominal pain and mass near the appendix.  I spoke with [facility] and it was decided the patient could follow-up with Dr. Henrene Pastor this week.  She is given Ultram and Zofran    Final Clinical Impressions(s) / ED Diagnoses   Final diagnoses:  Lower abdominal pain    ED Discharge Orders        Ordered    traMADol (ULTRAM) 50 MG tablet  Every 6 hours PRN     03/26/17 1856    ondansetron (ZOFRAN ODT) 4 MG disintegrating tablet     03/26/17 1856       Milton Ferguson, MD 03/26/17 1900

## 2017-03-26 NOTE — ED Triage Notes (Signed)
Patient here with complaints of abdominal pain. Worse when she eats or drinks. Reports that she had a coloscopy done last Thursday that was abnormal. CT scan was to be on on 12/10 to pelvis to rule out periappendiceal mass.

## 2017-03-26 NOTE — ED Notes (Signed)
Pt had drawn for labs:  Gold Blue Lavender Lt green Dark green x2 

## 2017-03-26 NOTE — Discharge Instructions (Signed)
Call Dr. Henrene Pastor Monday and tell them that you had a CAT scan of your abdomen today in the emergency department.   You should follow-up with Dr. Henrene Pastor this week

## 2017-03-28 ENCOUNTER — Inpatient Hospital Stay: Admission: RE | Admit: 2017-03-28 | Payer: Self-pay | Source: Ambulatory Visit

## 2017-03-29 ENCOUNTER — Inpatient Hospital Stay: Admission: RE | Admit: 2017-03-29 | Payer: Self-pay | Source: Ambulatory Visit

## 2017-03-30 ENCOUNTER — Telehealth: Payer: Self-pay | Admitting: Internal Medicine

## 2017-03-30 DIAGNOSIS — K388 Other specified diseases of appendix: Secondary | ICD-10-CM

## 2017-03-30 NOTE — Telephone Encounter (Signed)
Left message for pt's son to call back

## 2017-03-30 NOTE — Telephone Encounter (Signed)
PC placed to pt today using [employer]. Pt informed that I reviewed the colonoscopy and CT scan report. 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 Needs referral to surgeon. Referral submited.

## 2017-04-01 NOTE — Telephone Encounter (Signed)
Call was never returned. Pts PCP was handling setting pt up for Surgical appt.

## 2017-04-14 ENCOUNTER — Ambulatory Visit (INDEPENDENT_AMBULATORY_CARE_PROVIDER_SITE_OTHER): Payer: No Typology Code available for payment source | Admitting: Surgery

## 2017-04-14 ENCOUNTER — Encounter: Payer: Self-pay | Admitting: Surgery

## 2017-04-14 VITALS — BP 150/82 | HR 64 | Temp 98.0°F | Ht <= 58 in | Wt 117.6 lb

## 2017-04-14 DIAGNOSIS — K388 Other specified diseases of appendix: Secondary | ICD-10-CM

## 2017-04-14 MED ORDER — NEOMYCIN SULFATE 500 MG PO TABS: 500.0000 mg | ORAL_TABLET | Freq: Three times a day (TID) | ORAL | 0 refills | Status: DC

## 2017-04-14 MED ORDER — BISACODYL EC 5 MG PO TBEC: DELAYED_RELEASE_TABLET | ORAL | 0 refills | Status: DC

## 2017-04-14 MED ORDER — POLYETHYLENE GLYCOL 3350 17 GM/SCOOP PO POWD: 1.0000 | Freq: Once | ORAL | 0 refills | Status: AC

## 2017-04-14 MED ORDER — ERYTHROMYCIN BASE 500 MG PO TABS: 500.0000 mg | ORAL_TABLET | Freq: Three times a day (TID) | ORAL | 0 refills | Status: DC

## 2017-04-14 NOTE — Patient Instructions (Addendum)
Su cirugia va a ser el 87 [address] [place] del 2019 con el Dr. Caroleen Hamman. Yo la llamare para dejarle saber cuando va a ser su cita de pre-admicion.  Si tiene preguntas sobre su preparacion antes de su cirugia, por favor llamenos. (Bowel Prep was given to patient and medications were prescribed).

## 2017-04-14 NOTE — H&P (View-Only) (Signed)
Patient ID: Tina Johnson, female   DOB: [birth year], 51 y.o.   MRN: [id number]  HPI Tina Johnson is a 51 y.o. female seen in consultation at the request of Dr. Henrene Pastor. Underwent a routine colonoscopy on November 29 finding a small cecal polyp on more importantly a submucosal mass was seen around the appendiceal orifice.  She reports about a two-year history of intermittent mild to moderate abdominal pain that is diffuse and located in the lower abdomen. No specific alleviating or aggravating factors. He does have asked and seated abdominal distention. No nausea vomiting. Most recent visit to the emergency room prompted a CT scan of the abdomen that I have personally reviewed showing evidence of a mucocele with in wall. No evidence of metastatic disease,  no evidence of features consistent with  Adenocarcinoma. No evidence of ascites. I have also personally reviewed the colonoscopy images. She Is able to perform more than 4 Mets of activity without any shortness of breath or chest pain.  HPI  Past Medical History:  Diagnosis Date  . Anxiety    controls with exercise   . Arthritis    knees , shoulders  . Indigestion    no medicine at present    Past Surgical History:  Procedure Laterality Date  . ABDOMINAL HYSTERECTOMY    . TOTAL ABDOMINAL HYSTERECTOMY W/ BILATERAL SALPINGOOPHORECTOMY    . TOTAL KNEE ARTHROPLASTY Right 04/03/2015   Procedure: RIGHT TOTAL KNEE ARTHROPLASTY;  Surgeon: Leandrew Koyanagi, MD;  Location: [facility];  Service: Orthopedics;  Laterality: Right;  . TOTAL KNEE ARTHROPLASTY Left 04/03/2015   Procedure: LEFT TOTAL KNEE ARTHROPLASTY;  Surgeon: Leandrew Koyanagi, MD;  Location: [facility];  Service: Orthopedics;  Laterality: Left;  Marland Kitchen VAGINAL DELIVERY     ?x2    Family History  Problem Relation Age of Onset  . Diabetes Sister   . Diabetes Brother   . Diabetes Mother   . Colon cancer Neg Hx   . Cancer Neg Hx     Social History Social History   Tobacco Use  .  Smoking status: Never Smoker  . Smokeless tobacco: Never Used  Substance Use Topics  . Alcohol use: No  . Drug use: No    Allergies  Allergen Reactions  . Latex Rash    Current Outpatient Medications  Medication Sig Dispense Refill  . ondansetron (ZOFRAN ODT) 4 MG disintegrating tablet 4mg  ODT q4 hours prn nausea/vomit 10 tablet 0  . pravastatin (PRAVACHOL) 20 MG tablet Take 1 tablet (20 mg total) by mouth daily. 90 tablet 0  . traMADol (ULTRAM) 50 MG tablet Take 1 tablet (50 mg total) by mouth every 6 (six) hours as needed. 20 tablet 0   No current facility-administered medications for this visit.      Review of Systems Full ROS  was asked and was negative except for the information on the HPI  Physical Exam Blood pressure (!) 150/82, pulse 64, temperature 98 F (36.7 C), temperature source Oral, height 4\' 6"  (1.372 m), weight 53.3 kg (117 lb 9.6 oz). CONSTITUTIONAL: NAD. EYES: Pupils are equal, round, and reactive to light, Sclera are non-icteric. EARS, NOSE, MOUTH AND THROAT: The oropharynx is clear. The oral mucosa is pink and moist. Hearing is intact to voice. LYMPH NODES:  Lymph nodes in the neck are normal. RESPIRATORY:  Lungs are clear. There is normal respiratory effort, with equal breath sounds bilaterally, and without pathologic use of accessory muscles. CARDIOVASCULAR: Heart is regular without murmurs, gallops, or rubs. GI:  The abdomen is  soft, nontender, and nondistended. There are no palpable masses. There is no hepatosplenomegaly. There are normal bowel sounds in all quadrants. Previous lower midline scar GU: Rectal deferred.   MUSCULOSKELETAL: Normal muscle strength and tone. No cyanosis or edema.   SKIN: Turgor is good and there are no pathologic skin lesions or ulcers. NEUROLOGIC: Motor and sensation is grossly normal. Cranial nerves are grossly intact. PSYCH:  Oriented to person, place and time. Affect is normal.  Data Reviewed  I have personally  reviewed the patient's imaging, laboratory findings and medical records.    Assessment/Plan Mucocele of the appendix . Scars with the patient in detail about her disease process. It is significant E [place] that requires excision. Discussed with the patient in detail about my thought process. Initially I will attempt to do a laparoscopic cecectomy and sent the specimen for frozen section. If the pathology is indicative for carcinoma she will need a formal right hemicolectomy. Procedure discussed with the patient in detail. Risk, benefits and possible complications including but not limited to: Infection, anastomotic leak, bleeding, or long hospitalization. She understands and wishes to proceed. She will do bowel prep the day before surgery. A copy of the report will be sent to the referring provider.  Caroleen Hamman, MD FACS General Surgeon 04/14/2017, 9:08 AM

## 2017-04-14 NOTE — Addendum Note (Signed)
Addended by: Wayna Chalet on: 04/14/2017 10:13 AM   Modules accepted: Orders

## 2017-04-14 NOTE — Progress Notes (Signed)
Patient ID: Tina Johnson, female   DOB: 07-Jul-1965, 51 y.o.   MRN: [id number]  HPI Tina Johnson is a 51 y.o. female seen in consultation at the request of Dr. Henrene Pastor. Underwent a routine colonoscopy on November 29 finding a small cecal polyp on more importantly a submucosal mass was seen around the appendiceal orifice.  She reports about a two-year history of intermittent mild to moderate abdominal pain that is diffuse and located in the lower abdomen. No specific alleviating or aggravating factors. He does have asked and seated abdominal distention. No nausea vomiting. Most recent visit to the emergency room prompted a CT scan of the abdomen that I have personally reviewed showing evidence of a mucocele with in wall. No evidence of metastatic disease,  no evidence of features consistent with  Adenocarcinoma. No evidence of ascites. I have also personally reviewed the colonoscopy images. She Is able to perform more than 4 Mets of activity without any shortness of breath or chest pain.  HPI  Past Medical History:  Diagnosis Date  . Anxiety    controls with exercise   . Arthritis    knees , shoulders  . Indigestion    no medicine at present    Past Surgical History:  Procedure Laterality Date  . ABDOMINAL HYSTERECTOMY    . TOTAL ABDOMINAL HYSTERECTOMY W/ BILATERAL SALPINGOOPHORECTOMY    . TOTAL KNEE ARTHROPLASTY Right 04/03/2015   Procedure: RIGHT TOTAL KNEE ARTHROPLASTY;  Surgeon: Leandrew Koyanagi, MD;  Location: [facility];  Service: Orthopedics;  Laterality: Right;  . TOTAL KNEE ARTHROPLASTY Left 04/03/2015   Procedure: LEFT TOTAL KNEE ARTHROPLASTY;  Surgeon: Leandrew Koyanagi, MD;  Location: [facility];  Service: Orthopedics;  Laterality: Left;  Marland Kitchen VAGINAL DELIVERY     ?x2    Family History  Problem Relation Age of Onset  . Diabetes Sister   . Diabetes Brother   . Diabetes Mother   . Colon cancer Neg Hx   . Cancer Neg Hx     Social History Social History   Tobacco Use  .  Smoking status: Never Smoker  . Smokeless tobacco: Never Used  Substance Use Topics  . Alcohol use: No  . Drug use: No    Allergies  Allergen Reactions  . Latex Rash    Current Outpatient Medications  Medication Sig Dispense Refill  . ondansetron (ZOFRAN ODT) 4 MG disintegrating tablet 4mg  ODT q4 hours prn nausea/vomit 10 tablet 0  . pravastatin (PRAVACHOL) 20 MG tablet Take 1 tablet (20 mg total) by mouth daily. 90 tablet 0  . traMADol (ULTRAM) 50 MG tablet Take 1 tablet (50 mg total) by mouth every 6 (six) hours as needed. 20 tablet 0   No current facility-administered medications for this visit.      Review of Systems Full ROS  was asked and was negative except for the information on the HPI  Physical Exam Blood pressure (!) 150/82, pulse 64, temperature 98 F (36.7 C), temperature source Oral, height 4\' 6"  (1.372 m), weight 53.3 kg (117 lb 9.6 oz). CONSTITUTIONAL: NAD. EYES: Pupils are equal, round, and reactive to light, Sclera are non-icteric. EARS, NOSE, MOUTH AND THROAT: The oropharynx is clear. The oral mucosa is pink and moist. Hearing is intact to voice. LYMPH NODES:  Lymph nodes in the neck are normal. RESPIRATORY:  Lungs are clear. There is normal respiratory effort, with equal breath sounds bilaterally, and without pathologic use of accessory muscles. CARDIOVASCULAR: Heart is regular without murmurs, gallops, or rubs. GI:  The abdomen is  soft, nontender, and nondistended. There are no palpable masses. There is no hepatosplenomegaly. There are normal bowel sounds in all quadrants. Previous lower midline scar GU: Rectal deferred.   MUSCULOSKELETAL: Normal muscle strength and tone. No cyanosis or edema.   SKIN: Turgor is good and there are no pathologic skin lesions or ulcers. NEUROLOGIC: Motor and sensation is grossly normal. Cranial nerves are grossly intact. PSYCH:  Oriented to person, place and time. Affect is normal.  Data Reviewed  I have personally  reviewed the patient's imaging, laboratory findings and medical records.    Assessment/Plan Mucocele of the appendix . Scars with the patient in detail about her disease process. It is significant E [place] that requires excision. Discussed with the patient in detail about my thought process. Initially I will attempt to do a laparoscopic cecectomy and sent the specimen for frozen section. If the pathology is indicative for carcinoma she will need a formal right hemicolectomy. Procedure discussed with the patient in detail. Risk, benefits and possible complications including but not limited to: Infection, anastomotic leak, bleeding, or long hospitalization. She understands and wishes to proceed. She will do bowel prep the day before surgery. A copy of the report will be sent to the referring provider.  Caroleen Hamman, MD FACS General Surgeon 04/14/2017, 9:08 AM

## 2017-04-15 ENCOUNTER — Telehealth: Payer: Self-pay | Admitting: Surgery

## 2017-04-15 NOTE — Telephone Encounter (Signed)
Herb Grays, RN has called patient to inform her of her surgery information. Spanish Speaking. No answer. She has left a detailed message. Herb Grays will call the patient back when she returns to the office on 04/20/17.

## 2017-04-21 NOTE — Telephone Encounter (Signed)
I have called interpreter services. They have called patient at both numbers listed to go over surgery information. No answer at both numbers. A voicemail was left by the interpreter at both numbers.

## 2017-04-22 ENCOUNTER — Encounter
Admission: RE | Admit: 2017-04-22 | Discharge: 2017-04-22 | Disposition: A | Discharge status: Home / Self Care | Payer: Self-pay | Source: Ambulatory Visit | Attending: Surgery | Admitting: Surgery

## 2017-04-22 ENCOUNTER — Telehealth: Payer: Self-pay | Admitting: Surgery

## 2017-04-22 ENCOUNTER — Other Ambulatory Visit: Payer: Self-pay

## 2017-04-22 ENCOUNTER — Telehealth: Payer: Self-pay | Admitting: Internal Medicine

## 2017-04-22 DIAGNOSIS — E785 Hyperlipidemia, unspecified: Secondary | ICD-10-CM

## 2017-04-22 DIAGNOSIS — Z01818 Encounter for other preprocedural examination: Secondary | ICD-10-CM | POA: Insufficient documentation

## 2017-04-22 HISTORY — DX: Nausea with vomiting, unspecified: R11.2

## 2017-04-22 HISTORY — DX: Other specified postprocedural states: Z98.890

## 2017-04-22 LAB — SURGICAL PCR SCREEN
MRSA, PCR: NEGATIVE
STAPHYLOCOCCUS AUREUS: NEGATIVE

## 2017-04-22 MED ORDER — PRAVASTATIN SODIUM 20 MG PO TABS: 20.0000 mg | ORAL_TABLET | Freq: Every day | ORAL | 0 refills | Status: DC

## 2017-04-22 NOTE — Telephone Encounter (Signed)
Refilled

## 2017-04-22 NOTE — Pre-Procedure Instructions (Signed)
Dorthula Rue interpreter with pt to help communication.

## 2017-04-22 NOTE — Telephone Encounter (Signed)
Pt came to the office to request a refill for pravastatin (PRAVACHOL) 20 MG tablet  Please sent it to [facility] at [place] Please follow up

## 2017-04-22 NOTE — Patient Instructions (Signed)
Your procedure is scheduled on:05/02/2017 Su procedimiento est programado para: Report to Same Day Surgery, second floor of the [facility].(large silver statue and revolving door). Presntese a: To find out your arrival time please call [phone] between 1PM - 3PM on Friday 04/29/2017. Para saber su hora de llegada por favor llame al [phone] entre la 1PM - 3PM el da:  Remember: Instructions that are not followed completely may result in serious medical risk, up to and including death, or upon the discretion of your surgeon and anesthesiologist your surgery may need to be rescheduled.  Recuerde: Las instrucciones que no se siguen completamente Heritage manager en un riesgo de salud grave, incluyendo hasta la [place] o a discrecin de su cirujano y Environmental health practitioner, su ciruga se puede posponer.   __X_ 1.Do not eat food after midnight the night before your procedure. No   gum chewing or hard candies. You may drink clear liquids up to 2 hours   before you are scheduled to arrive for your surgery- DO not drink clear   liquids within 2 hours of coming to the hospital.     Clear Liquids include:    water, apple juice without pulp, clear carbohydrate drink such as    Clearfast of Gartorade, Black Coffee or Tea (Do not add anything to   coffee or tea).      No coma nada despus de la medianoche de la noche anterior a su   procedimiento. No coma chicles ni caramelos duros. Puede tomar   lquidos claros hasta 2 horas antes de su hora programada de llegada al   hospital para su procedimiento. No tome lquidos claros durante el   transcurso de las 2 horas de su llegada programada al hospital para su   procedimiento, ya que esto puede llevar a que su procedimiento se   retrase o tenga que volver a Health and safety inspector.  Los lquidos claros incluyen:         - Agua o jugo de [place] sin pulpa         - Bebidas claras con carbohidratos como ClearFast o Gatorade         - Caf negro o t claro (sin leche, sin  cremas, no agregue nada al caf ni al  t)  No tome nada que no est en esta lista.  Los pacientes con diabetes tipo 1 y tipo 2 solo deben Agricultural engineer.  Llame a la clnica de PreCare o a la unidad de Same Day Surgery si  tiene alguna pregunta sobre estas instrucciones.              ___ 2.Do Not Smoke or use e-cigarettes For 24 Hours Prior to Your    Surgery.  Do not use any chewable tobacco products for at least 6   hours prior to surgery.    No fume ni use cigarrillos electrnicos durante las 24 horas previas   a su [place].  No use ningn producto de tabaco masticable durante  al menos 6 horas antes de la ciruga.     ___ 3. No alcohol for 24 hours before or after surgery.    No tome alcohol durante las 24 horas antes ni despus de la [place].   ____4. Bring all medications with you on the day of surgery if instructed.    Lleve todos los medicamentos con usted el da de su ciruga si se le   ha indicado as.   _x_ 5. Notify your doctor if there is any change  in your medical condition (cold,   fever, infections).    Informe a su mdico si hay algn cambio en su condicin mdica   (resfriado, fiebre, infecciones).   Do not wear jewelry, make-up, hairpins, clips or nail polish.  No use joyas, maquillajes, pinzas/ganchos para el cabello ni esmalte de uas.  Do not wear lotions, powders, or perfumes. You may wear deodorant.  No use lociones, polvos o perfumes.  Puede usar desodorante.    Do not shave 48 hours prior to surgery. Men may shave face and neck.  No se afeite 48 horas antes de la [place].  Los hombres pueden [employer] cara  y el cuello.   Do not bring valuables to the hospital.   No lleve objetos [place] is not responsible for any belongings or valuables.  [place] no se hace responsable de ningn tipo de pertenencias u objetos de Geographical information systems officer.               Contacts, dentures or bridgework may not be worn into surgery.  Los lentes de [place], las  dentaduras postizas o puentes no se pueden usar en la [place].  Leave your suitcase in the car. After surgery it may be brought to your room.  Deje su maleta en el auto.  Despus de la ciruga podr traerla a su habitacin.  For patients admitted to the hospital, discharge time is determined by your treatment team.  Para los pacientes que sean ingresados al hospital, el tiempo en el cual se le dar de alta es determinado por su equipo de [place].   Patients discharged the day of surgery will not be allowed to drive home. A los pacientes que se les da de alta el mismo da de la ciruga no se les permitir conducir a Holiday representative.   Please read over the following fact sheets that you were given: Por favor [place] informacin que le dieron:      _x_ Take these medicines the morning of surgery with A SIP OF WATER:          [employer] medicinas la maana de la ciruga con UN SORBO DE AGUA:  1. Prep as directed by Dr. Dahlia Byes   ____ Fleet Enema (as directed)          Enema de Fleet (segn lo indicado)    __x__ Use CHG Soap as directed          Utilice el jabn de CHG segn lo indicado  ____ Use inhalers on the day of surgery          Use los inhaladores el da de la ciruga  ____ Stop metformin 2 days prior to surgery          Deje de tomar el metformin 2 das antes de la ciruga    ____ Take 1/2 of usual insulin dose the night before surgery and none on the morning of surgery           Tome la mitad de la dosis habitual de insulina la noche antes de la [place] y no tome nada en la maana de la             ciruga  ____ Stop Coumadin/Plavix/aspirin on does not apply.          Deje de tomar el Coumadin/Plavix/aspirina el da:  _x___ Stop Anti-inflammatories on today.  OK to take Tylenol or Tramadol for pain.  Deje de tomar Regulatory affairs officer:   _x___ Stop supplements: Garlic, OMEGA-3, NRG tea  until after surgery            Deje de tomar suplementos hasta  despus de la ciruga  ____ Bring C-Pap to the hospital          [facility] al hospital

## 2017-04-22 NOTE — Pre-Procedure Instructions (Signed)
During PAT visit, discussed with pt if a bowel prep was ordered, she said yes through interpreter, but was not sure of the directions.  This RN looked up the prep in Managed Orders, it look like the prep has been called into pt's pharmacy [facility] on [address] in [place]. Instructed pt to pick up the prep from the pharmacy, read the directions and if she has any questions to call Dr. Corlis Leak office.

## 2017-04-22 NOTE — Telephone Encounter (Signed)
Rosann Auerbach with pre-admit called and left a message said when she was interviewing the patient for her pre-admit the patient was confused on what prep she needed to do for surgery on the 14th of January and is asking for someone to call the patient and give the patient this information and where she needs to pick up the prep for surgery. Please call patient and advise. Or any questions Rosann Auerbach can be reached at [phone].

## 2017-04-26 ENCOUNTER — Telehealth: Payer: Self-pay

## 2017-04-26 NOTE — Telephone Encounter (Signed)
Please see other note. Patient had been helped.

## 2017-04-26 NOTE — Telephone Encounter (Signed)
Called patient and reminded her that she will need to prep before her surgery. She stated that she had received my letter with instructions. Patient stated that she had not received her antibiotics when she went to her pharmacy. I told her that I would call her pharmacy and ask why they were not given.  I called patient back to let her know that her pharmacy will have her antibiotics ready later on today. Patient understood and had no further questions.

## 2017-05-02 ENCOUNTER — Encounter: Payer: Self-pay | Admitting: *Deleted

## 2017-05-02 ENCOUNTER — Inpatient Hospital Stay: Payer: Self-pay | Admitting: Anesthesiology

## 2017-05-02 ENCOUNTER — Encounter
Admission: RE | Disposition: A | Discharge status: Home / Self Care | Payer: Self-pay | Source: Ambulatory Visit | Attending: Surgery

## 2017-05-02 ENCOUNTER — Other Ambulatory Visit: Payer: Self-pay

## 2017-05-02 ENCOUNTER — Ambulatory Visit
Admission: RE | Admit: 2017-05-02 | Discharge: 2017-05-02 | Disposition: A | Discharge status: Home / Self Care | Payer: Self-pay | Source: Ambulatory Visit | Attending: Surgery | Admitting: Surgery

## 2017-05-02 DIAGNOSIS — K3580 Unspecified acute appendicitis: Secondary | ICD-10-CM | POA: Insufficient documentation

## 2017-05-02 DIAGNOSIS — Z96653 Presence of artificial knee joint, bilateral: Secondary | ICD-10-CM | POA: Insufficient documentation

## 2017-05-02 DIAGNOSIS — D490 Neoplasm of unspecified behavior of digestive system: Secondary | ICD-10-CM

## 2017-05-02 DIAGNOSIS — D121 Benign neoplasm of appendix: Secondary | ICD-10-CM | POA: Insufficient documentation

## 2017-05-02 DIAGNOSIS — K660 Peritoneal adhesions (postprocedural) (postinfection): Secondary | ICD-10-CM | POA: Insufficient documentation

## 2017-05-02 DIAGNOSIS — Z79899 Other long term (current) drug therapy: Secondary | ICD-10-CM | POA: Insufficient documentation

## 2017-05-02 HISTORY — PX: LAPAROSCOPIC APPENDECTOMY: SHX408

## 2017-05-02 SURGERY — APPENDECTOMY, LAPAROSCOPIC
Anesthesia: General | Wound class: Clean Contaminated

## 2017-05-02 MED ORDER — DEXAMETHASONE SODIUM PHOSPHATE 10 MG/ML IJ SOLN
INTRAMUSCULAR | Status: DC | PRN
  Administered 2017-05-02: 10 mg via INTRAVENOUS

## 2017-05-02 MED ORDER — DEXAMETHASONE SODIUM PHOSPHATE 10 MG/ML IJ SOLN
INTRAMUSCULAR | Status: AC
  Filled 2017-05-02: qty 1

## 2017-05-02 MED ORDER — MIDAZOLAM HCL 2 MG/2ML IJ SOLN
INTRAMUSCULAR | Status: DC | PRN
  Administered 2017-05-02 (×2): 1 mg via INTRAVENOUS

## 2017-05-02 MED ORDER — GLYCOPYRROLATE 0.2 MG/ML IJ SOLN
INTRAMUSCULAR | Status: DC | PRN
  Administered 2017-05-02: 0.2 mg via INTRAVENOUS

## 2017-05-02 MED ORDER — SCOPOLAMINE 1 MG/3DAYS TD PT72: 1.0000 | MEDICATED_PATCH | Freq: Once | TRANSDERMAL | Status: DC

## 2017-05-02 MED ORDER — PROPOFOL 10 MG/ML IV BOLUS
INTRAVENOUS | Status: DC | PRN
  Administered 2017-05-02: 120 mg via INTRAVENOUS

## 2017-05-02 MED ORDER — HEPARIN SODIUM (PORCINE) 5000 UNIT/ML IJ SOLN
5000.0000 [IU] | Freq: Once | INTRAMUSCULAR | Status: AC
  Administered 2017-05-02: 5000 [IU] via SUBCUTANEOUS

## 2017-05-02 MED ORDER — BUPIVACAINE LIPOSOME 1.3 % IJ SUSP
INTRAMUSCULAR | Status: AC
  Filled 2017-05-02: qty 20

## 2017-05-02 MED ORDER — CHLORHEXIDINE GLUCONATE CLOTH 2 % EX PADS: 6.0000 | MEDICATED_PAD | Freq: Once | CUTANEOUS | Status: DC

## 2017-05-02 MED ORDER — ROCURONIUM BROMIDE 50 MG/5ML IV SOLN
INTRAVENOUS | Status: AC
  Filled 2017-05-02: qty 1

## 2017-05-02 MED ORDER — HYDROCODONE-ACETAMINOPHEN 5-325 MG PO TABS: 1.0000 | ORAL_TABLET | Freq: Four times a day (QID) | ORAL | 0 refills | Status: DC | PRN

## 2017-05-02 MED ORDER — BUPIVACAINE-EPINEPHRINE (PF) 0.25% -1:200000 IJ SOLN
INTRAMUSCULAR | Status: AC
  Filled 2017-05-02: qty 30

## 2017-05-02 MED ORDER — ONDANSETRON HCL 4 MG/2ML IJ SOLN
INTRAMUSCULAR | Status: DC | PRN
  Administered 2017-05-02: 4 mg via INTRAVENOUS

## 2017-05-02 MED ORDER — ROCURONIUM BROMIDE 100 MG/10ML IV SOLN
INTRAVENOUS | Status: DC | PRN
  Administered 2017-05-02: 10 mg via INTRAVENOUS
  Administered 2017-05-02: 30 mg via INTRAVENOUS
  Administered 2017-05-02: 10 mg via INTRAVENOUS

## 2017-05-02 MED ORDER — PROPOFOL 10 MG/ML IV BOLUS
INTRAVENOUS | Status: AC
  Filled 2017-05-02: qty 20

## 2017-05-02 MED ORDER — HEPARIN SODIUM (PORCINE) 5000 UNIT/ML IJ SOLN
INTRAMUSCULAR | Status: AC
  Filled 2017-05-02: qty 1

## 2017-05-02 MED ORDER — FENTANYL CITRATE (PF) 100 MCG/2ML IJ SOLN
INTRAMUSCULAR | Status: DC | PRN
  Administered 2017-05-02: 100 ug via INTRAVENOUS
  Administered 2017-05-02 (×3): 50 ug via INTRAVENOUS

## 2017-05-02 MED ORDER — SUCCINYLCHOLINE CHLORIDE 20 MG/ML IJ SOLN
INTRAMUSCULAR | Status: AC
  Filled 2017-05-02: qty 2

## 2017-05-02 MED ORDER — FENTANYL CITRATE (PF) 250 MCG/5ML IJ SOLN
INTRAMUSCULAR | Status: AC
  Filled 2017-05-02: qty 5

## 2017-05-02 MED ORDER — ACETAMINOPHEN 10 MG/ML IV SOLN
INTRAVENOUS | Status: DC | PRN
  Administered 2017-05-02: 1000 mg via INTRAVENOUS

## 2017-05-02 MED ORDER — FAMOTIDINE 20 MG PO TABS
20.0000 mg | ORAL_TABLET | Freq: Once | ORAL | Status: AC
  Administered 2017-05-02: 20 mg via ORAL

## 2017-05-02 MED ORDER — LIDOCAINE HCL (PF) 2 % IJ SOLN
INTRAMUSCULAR | Status: AC
  Filled 2017-05-02: qty 10

## 2017-05-02 MED ORDER — SUGAMMADEX SODIUM 200 MG/2ML IV SOLN
INTRAVENOUS | Status: AC
  Filled 2017-05-02: qty 2

## 2017-05-02 MED ORDER — ONDANSETRON HCL 4 MG/2ML IJ SOLN
INTRAMUSCULAR | Status: AC
  Filled 2017-05-02: qty 2

## 2017-05-02 MED ORDER — SODIUM CHLORIDE 0.9 % IV SOLN
1.0000 g | INTRAVENOUS | Status: AC
  Administered 2017-05-02: 1 g via INTRAVENOUS
  Filled 2017-05-02: qty 1

## 2017-05-02 MED ORDER — KETOROLAC TROMETHAMINE 30 MG/ML IJ SOLN
INTRAMUSCULAR | Status: DC | PRN
  Administered 2017-05-02: 30 mg via INTRAVENOUS

## 2017-05-02 MED ORDER — FENTANYL CITRATE (PF) 100 MCG/2ML IJ SOLN: 25.0000 ug | INTRAMUSCULAR | Status: DC | PRN

## 2017-05-02 MED ORDER — PHENYLEPHRINE HCL 10 MG/ML IJ SOLN
INTRAMUSCULAR | Status: DC | PRN
  Administered 2017-05-02 (×2): 40 ug via INTRAVENOUS

## 2017-05-02 MED ORDER — SODIUM CHLORIDE FLUSH 0.9 % IV SOLN
INTRAVENOUS | Status: AC
  Filled 2017-05-02: qty 10

## 2017-05-02 MED ORDER — ACETAMINOPHEN 10 MG/ML IV SOLN
INTRAVENOUS | Status: AC
  Filled 2017-05-02: qty 100

## 2017-05-02 MED ORDER — LACTATED RINGERS IV SOLN
INTRAVENOUS | Status: DC
  Administered 2017-05-02 (×2): via INTRAVENOUS

## 2017-05-02 MED ORDER — LIDOCAINE 2% (20 MG/ML) 5 ML SYRINGE
INTRAMUSCULAR | Status: DC | PRN
  Administered 2017-05-02: 100 mg via INTRAVENOUS

## 2017-05-02 MED ORDER — SUGAMMADEX SODIUM 200 MG/2ML IV SOLN
INTRAVENOUS | Status: DC | PRN
  Administered 2017-05-02: 106.2 mg via INTRAVENOUS

## 2017-05-02 MED ORDER — PROMETHAZINE HCL 25 MG/ML IJ SOLN: 6.2500 mg | INTRAMUSCULAR | Status: DC | PRN

## 2017-05-02 MED ORDER — MIDAZOLAM HCL 2 MG/2ML IJ SOLN
INTRAMUSCULAR | Status: AC
  Filled 2017-05-02: qty 2

## 2017-05-02 MED ORDER — FAMOTIDINE 20 MG PO TABS
ORAL_TABLET | ORAL | Status: AC
  Filled 2017-05-02: qty 1

## 2017-05-02 SURGICAL SUPPLY — 46 items
ADH SKN CLS APL DERMABOND .7 (GAUZE/BANDAGES/DRESSINGS) ×1
APPLICATOR COTTON TIP 6IN STRL (MISCELLANEOUS) ×4 IMPLANT
APPLIER CLIP 5 13 M/L LIGAMAX5 (MISCELLANEOUS)
APR CLP MED LRG 5 ANG JAW (MISCELLANEOUS)
BAG SPEC RTRVL LRG 6X4 10 (ENDOMECHANICALS)
BLADE CLIPPER SURG (BLADE) ×3 IMPLANT
CANISTER SUCT 1200ML W/VALVE (MISCELLANEOUS) ×3 IMPLANT
CHLORAPREP W/TINT 26ML (MISCELLANEOUS) ×3 IMPLANT
CLIP APPLIE 5 13 M/L LIGAMAX5 (MISCELLANEOUS) ×1 IMPLANT
CNTNR SPEC 2.5X3XGRAD LEK (MISCELLANEOUS) ×1
CONT SPEC 4OZ STER OR WHT (MISCELLANEOUS) ×2
CONT SPEC 4OZ STRL OR WHT (MISCELLANEOUS) ×1
CONTAINER SPEC 2.5X3XGRAD LEK (MISCELLANEOUS) IMPLANT
CUTTER FLEX LINEAR 45M (STAPLE) ×3 IMPLANT
DERMABOND ADVANCED (GAUZE/BANDAGES/DRESSINGS) ×2
DERMABOND ADVANCED .7 DNX12 (GAUZE/BANDAGES/DRESSINGS) ×1 IMPLANT
ELECT CAUTERY BLADE 6.4 (BLADE) ×3 IMPLANT
ELECT REM PT RETURN 9FT ADLT (ELECTROSURGICAL) ×3
ELECTRODE REM PT RTRN 9FT ADLT (ELECTROSURGICAL) ×1 IMPLANT
GLOVE BIO SURGEON STRL SZ7 (GLOVE) ×7 IMPLANT
GOWN STRL REUS W/ TWL LRG LVL3 (GOWN DISPOSABLE) ×2 IMPLANT
GOWN STRL REUS W/TWL LRG LVL3 (GOWN DISPOSABLE) ×12
IRRIGATION STRYKERFLOW (MISCELLANEOUS) ×1 IMPLANT
IRRIGATOR STRYKERFLOW (MISCELLANEOUS) ×3
IV NS 1000ML (IV SOLUTION) ×3
IV NS 1000ML BAXH (IV SOLUTION) ×1 IMPLANT
NEEDLE HYPO 22GX1.5 SAFETY (NEEDLE) ×3 IMPLANT
NS IRRIG 500ML POUR BTL (IV SOLUTION) ×3 IMPLANT
PACK LAP CHOLECYSTECTOMY (MISCELLANEOUS) ×3 IMPLANT
PENCIL ELECTRO HAND CTR (MISCELLANEOUS) ×3 IMPLANT
POUCH SPECIMEN RETRIEVAL 10MM (ENDOMECHANICALS) ×1 IMPLANT
RELOAD 45 VASCULAR/THIN (ENDOMECHANICALS) ×3 IMPLANT
RELOAD STAPLE 45 2.5 WHT GRN (ENDOMECHANICALS) ×1 IMPLANT
RELOAD STAPLE 45 3.5 BLU ETS (ENDOMECHANICALS) ×1 IMPLANT
RELOAD STAPLE TA45 3.5 REG BLU (ENDOMECHANICALS) ×6 IMPLANT
SCALPEL HARMONIC ACE (MISCELLANEOUS) ×3 IMPLANT
SCISSORS METZENBAUM CVD 33 (INSTRUMENTS) ×3 IMPLANT
SLEEVE ENDOPATH XCEL 5M (ENDOMECHANICALS) ×3 IMPLANT
SPONGE LAP 18X18 5 PK (GAUZE/BANDAGES/DRESSINGS) ×3 IMPLANT
SUT MNCRL AB 4-0 PS2 18 (SUTURE) ×3 IMPLANT
SUT VICRYL 0 AB UR-6 (SUTURE) ×6 IMPLANT
SYR 20CC LL (SYRINGE) ×3 IMPLANT
TRAY FOLEY W/METER SILVER 16FR (SET/KITS/TRAYS/PACK) ×3 IMPLANT
TROCAR XCEL BLUNT TIP 100MML (ENDOMECHANICALS) ×3 IMPLANT
TROCAR XCEL NON-BLD 5MMX100MML (ENDOMECHANICALS) ×6 IMPLANT
TUBING INSUF HEATED (TUBING) ×3 IMPLANT

## 2017-05-02 NOTE — Op Note (Signed)
laparascopic Cecectomy   Tina Johnson Date of operation:  05/02/2017  Indications: The patient presented with a history of  Mucocele of the appendix   Pre-operative Diagnosis: Mucocele of the appendix  Post-operative Diagnosis: Same  Surgeon: Caroleen Hamman, MD, FACS  Anesthesia: General with endotracheal tube  Findings: Mucocele of the appendix, no evidence of metastatic disease or peritoneal implants Dense adhesions from the omentum to the abdominal wall   Estimated Blood Loss: 5cc         Specimens: appendix, cecum         Complications:  none  Procedure Details  The patient was seen again in the preop area. The options of surgery versus observation were reviewed with the patient and/or family. The risks of bleeding, infection, recurrence of symptoms, negative laparoscopy, potential for an open procedure, bowel injury, abscess or infection, were all reviewed as well. The patient was taken to Operating Room, identified as Tina Johnson and the procedure verified as laparoscopic appendectomy. A Time Out was held and the above information confirmed.  The patient was placed in the supine position and general anesthesia was induced.  Antibiotic prophylaxis was administered and VT E prophylaxis was in place. A Foley catheter was placed by the nursing staff.   The abdomen was prepped and draped in a sterile fashion. A supraumbilical incision was made. A cutdown technique was used to enter the abdominal cavity. Two vicryl stitches were placed on the fascia and a Hasson trocar inserted. Pneumoperitoneum obtained. Two 5 mm ports were placed under direct visualization. Lysis of adhesions performed w scissors and harmonic scalpel. No evidence of bowel injury.   The appendix was identified and found to have a complex mass. The appendix was carefully dissected. The mesoappendix was divided with Harmonic scalpel. The base of the appendix was dissected out, we incised the white  line of Toldd and mobilize the cecum in a lateral to medial fashion.   We divided the cecum  with two standard load using Endo GIA stapler, we made sure we incorporated about 2 cms of cecum to obtain good margin of resection..The appendix  And cecum were placed in a Endo Catch bag and removed via the Hasson port. The right lower quadrant and pelvis was then irrigated with  normal saline which was aspirated. Inspection  failed to identify any additional bleeding and there were no signs of bowel injury.   Frozen section showed no evidence of carcinoma or malignant features  The right lower quadrant was inspected there was no sign of bleeding or bowel injury therefore pneumoperitoneum was released, all ports were removed.  The umbilical fascia was closed with 0 Vicryl interrupted sutures and the skin incisions were approximated with subcuticular 4-0 Monocryl. Dermabond was placed The patient tolerated the procedure well, there were no complications. The sponge lap and needle count were correct at the end of the procedure. The patient was taken to the recovery room in stable condition to be admitted for continued care.    Caroleen Hamman, MD FACS

## 2017-05-02 NOTE — Transfer of Care (Signed)
Immediate Anesthesia Transfer of Care Note  Patient: Tina Johnson  Procedure(s) Performed: LAPAROSCOPIC CECECTOMY (N/A )  Patient Location: PACU  Anesthesia Type:General  Level of Consciousness: awake, alert  and oriented  Airway & Oxygen Therapy: Patient Spontanous Breathing and Patient connected to nasal cannula oxygen  Post-op Assessment: Report given to RN and Post -op Vital signs reviewed and stable  Post vital signs: Reviewed and stable  Last Vitals:  Vitals:   05/02/17 1029 05/02/17 1336  BP: (!) 159/81   Pulse: (!) 59   Resp: 16   Temp: (!) 36.4 C (P) 36.8 C  SpO2: 100%     Last Pain:  Vitals:   05/02/17 1029  TempSrc: Oral         Complications: No apparent anesthesia complications

## 2017-05-02 NOTE — Anesthesia Postprocedure Evaluation (Signed)
Anesthesia Post Note  Patient: Engineer, civil (consulting)  Procedure(s) Performed: LAPAROSCOPIC CECECTOMY (N/A )  Patient location during evaluation: PACU Anesthesia Type: General Level of consciousness: awake and alert and oriented Pain management: pain level controlled Vital Signs Assessment: post-procedure vital signs reviewed and stable Respiratory status: spontaneous breathing Cardiovascular status: blood pressure returned to baseline Anesthetic complications: no     Last Vitals:  Vitals:   05/02/17 1336 05/02/17 1338  BP:  (!) 134/57  Pulse: 81 79  Resp: 10 (!) 22  Temp: 36.8 C   SpO2: 100% 100%    Last Pain:  Vitals:   05/02/17 1336  TempSrc:   PainSc: 2                  Eithan Beagle

## 2017-05-02 NOTE — Interval H&P Note (Signed)
History and Physical Interval Note:  05/02/2017 11:11 AM  Tina Johnson  has presented today for surgery, with the diagnosis of mucocele  The various methods of treatment have been discussed with the patient and family. After consideration of risks, benefits and other options for treatment, the patient has consented to  Procedure(s) with comments: LAPAROSCOPIC CECECTOMY (N/A) - Please open Lap appy tray.  Have gelport and Right hemicolectomy cart in the room.  LAPAROSCOPIC RIGHT HEMI COLECTOMY (N/A) as a surgical intervention .  The patient's history has been reviewed, patient examined, no change in status, stable for surgery.  I have reviewed the patient's chart and labs.  Questions were answered to the patient's satisfaction.     [place]

## 2017-05-02 NOTE — Anesthesia Procedure Notes (Signed)
Procedure Name: Intubation Date/Time: 05/02/2017 12:20 PM Performed by: Marsh Dolly, CRNA Pre-anesthesia Checklist: Patient identified, Patient being monitored, Timeout performed, Emergency Drugs available and Suction available Patient Re-evaluated:Patient Re-evaluated prior to induction Oxygen Delivery Method: Circle system utilized Preoxygenation: Pre-oxygenation with 100% oxygen Induction Type: IV induction Ventilation: Mask ventilation without difficulty Laryngoscope Size: 3 and Miller Grade View: Grade I Tube type: Oral Tube size: 6.5 mm Number of attempts: 1 Placement Confirmation: ETT inserted through vocal cords under direct vision,  positive ETCO2 and breath sounds checked- equal and bilateral Secured at: 21 cm Tube secured with: Tape Dental Injury: Teeth and Oropharynx as per pre-operative assessment

## 2017-05-02 NOTE — Anesthesia Preprocedure Evaluation (Signed)
Anesthesia Evaluation  Patient identified by MRN, date of birth, ID band Patient awake    Reviewed: Allergy & Precautions, H&P , NPO status , Patient's Chart, lab work & pertinent test results, reviewed documented beta blocker date and time   History of Anesthesia Complications (+) PONV and history of anesthetic complications  Airway Mallampati: II  TM Distance: >3 FB Neck ROM: full    Dental   Pulmonary neg pulmonary ROS,           Cardiovascular Exercise Tolerance: Good negative cardio ROS       Neuro/Psych  Headaches, neg Seizures PSYCHIATRIC DISORDERS    GI/Hepatic Neg liver ROS, GERD  ,  Endo/Other  negative endocrine ROS  Renal/GU negative Renal ROS  negative genitourinary   Musculoskeletal   Abdominal   Peds  Hematology negative hematology ROS (+)   Anesthesia Other Findings Past Medical History: No date: Anxiety     Comment:  controls with exercise  No date: Arthritis     Comment:  knees , shoulders No date: Indigestion     Comment:  no medicine at present 1999: PONV (postoperative nausea and vomiting)     Comment:  partial hysterectomy in [place]   Reproductive/Obstetrics negative OB ROS                             Anesthesia Physical Anesthesia Plan  ASA: II  Anesthesia Plan: General   Post-op Pain Management:    Induction: Intravenous  PONV Risk Score and Plan: 4 or greater and Ondansetron, Dexamethasone, Midazolam and Scopolamine patch - Pre-op  Airway Management Planned: Oral ETT  Additional Equipment:   Intra-op Plan:   Post-operative Plan: Extubation in OR  Informed Consent: I have reviewed the patients History and Physical, chart, labs and discussed the procedure including the risks, benefits and alternatives for the proposed anesthesia with the patient or authorized representative who has indicated his/her understanding and acceptance.   Dental  Advisory Given  Plan Discussed with: Anesthesiologist, CRNA and Surgeon  Anesthesia Plan Comments:         Anesthesia Quick Evaluation

## 2017-05-02 NOTE — Anesthesia Post-op Follow-up Note (Signed)
Anesthesia QCDR form completed.        

## 2017-05-03 ENCOUNTER — Encounter: Payer: Self-pay | Admitting: Surgery

## 2017-05-06 ENCOUNTER — Telehealth: Payer: Self-pay

## 2017-05-06 ENCOUNTER — Other Ambulatory Visit: Payer: Self-pay | Admitting: Pathology

## 2017-05-06 LAB — SURGICAL PATHOLOGY

## 2017-05-06 NOTE — Progress Notes (Unsigned)
n

## 2017-05-06 NOTE — Telephone Encounter (Signed)
Spoke with Dr.Baker regarding pathology result. 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   Results discussed with Dr.Woodham.  Staff message sent to Dr.Pabon to review and advise if sooner appointment is needed.

## 2017-05-08 ENCOUNTER — Telehealth: Payer: Self-pay | Admitting: Internal Medicine

## 2017-05-08 NOTE — Telephone Encounter (Signed)
I have reviewed the pathology report of the appendectomy done 05/02/2017.  Message sent to Dr. Henrene Pastor and Dr. Dahlia Byes on inquiring whether anything else needs to be done based on the results of mucinous neoplasm of the appendix.

## 2017-05-09 ENCOUNTER — Telehealth: Payer: Self-pay

## 2017-05-09 NOTE — Telephone Encounter (Signed)
Recall in epic for 1 year colon.

## 2017-05-09 NOTE — Telephone Encounter (Signed)
-----   Message from Celene Kras, [place] sent at 05/09/2017  9:09 AM EST ----- Regarding: RE: Pathology results Please call this spanish patient and let her know the below.  Thank you. ----- Message ----- From: Jules Husbands, MD Sent: 05/06/2017   7:11 PM To: Celene Kras, CMA Subject: RE: Pathology results                          I have reviewed the pathology. Please call the pt to tell her that we have removed the tumor of the appendix and no further surgery / treatment is needed. May see me as previously scheduled.Thx ----- Message ----- From: Celene Kras, CMA Sent: 05/06/2017  11:10 AM To: Jules Husbands, MD Subject: Pathology results                              I received call from Dr.Baker-Pathologist today.   Please see pathology results.  Patient is scheduled follow up with you 05/16/17 @ 2:45 pm

## 2017-05-09 NOTE — Telephone Encounter (Signed)
Tina Johnson could arrange for an oncology opinion.   From my standpoint, I will set her up for recall colonoscopy in 1 year [name] please make recall colonoscopy for one year). Thanks  [name]

## 2017-05-09 NOTE — Telephone Encounter (Signed)
Called patient per Dr. Dahlia Byes to let patient know that he had removed all of the tumor from her appendix and no further surgery is needed. I also reminded patient of her follow up appointment with Dr. Dahlia Byes. Patient did not have any questions or concerns.

## 2017-05-13 ENCOUNTER — Ambulatory Visit: Payer: Self-pay | Admitting: Internal Medicine

## 2017-05-16 ENCOUNTER — Ambulatory Visit (INDEPENDENT_AMBULATORY_CARE_PROVIDER_SITE_OTHER): Payer: Self-pay | Admitting: Surgery

## 2017-05-16 ENCOUNTER — Encounter: Payer: Self-pay | Admitting: Surgery

## 2017-05-16 VITALS — BP 147/84 | HR 53 | Temp 98.3°F | Ht <= 58 in | Wt 120.0 lb

## 2017-05-16 DIAGNOSIS — Z09 Encounter for follow-up examination after completed treatment for conditions other than malignant neoplasm: Secondary | ICD-10-CM

## 2017-05-16 NOTE — Patient Instructions (Addendum)
Por favor llamenos si tiene alguna pregunta.  La vamos a referir al [facility] oncologo para Tina Johnson segunda opinion.

## 2017-05-16 NOTE — Progress Notes (Signed)
S/p lap partial cecectomy for Mucocele. Path w low grade tumor. Margins are negative. No evidence of tumor spillage or peritoneal metastasis intraoperatively. She is doing well Taking PO. No fevers Path d/w pt in detail  PE NAD Abd: incisions c/d/i, no infection. No peritonitis  A/P  LOW-GRADE APPENDICEAL MUCINOUS NEOPLASM BASED ON CURRENT LITERATURE AND GIVEN HISTOLOGIC FINDINGS AND NEGATIVE MARGINS NO FURTHER SURGERY REQUIRED. D/W the pt about [facility] Surgical Oncology program.  She wishes to have a consultation w surgical oncology group at [facility].

## 2017-05-23 ENCOUNTER — Telehealth: Payer: Self-pay | Admitting: Surgery

## 2017-05-23 NOTE — Telephone Encounter (Signed)
I have spoke with Margreta Journey with [facility] Surgical Oncology [phone]). She stated that the provider reviews the notes to see if the patient is a candidate. They will also need to set up financial assistance for the patient as well. I asked Margreta Journey and she was not able to provide me with how long this process will take. She advised me to follow up on a weekly basis.   All clinic notes have been faxed and received. Fax: [phone].  Margreta Journey has faxed me the request for images. I have faxed this to [facility] Radiology to have these mailed.

## 2017-05-23 NOTE — Telephone Encounter (Signed)
Thank you :)

## 2017-05-31 ENCOUNTER — Encounter: Payer: Self-pay | Admitting: Internal Medicine

## 2017-05-31 ENCOUNTER — Other Ambulatory Visit: Payer: Self-pay

## 2017-05-31 ENCOUNTER — Ambulatory Visit: Payer: Self-pay | Attending: Internal Medicine | Admitting: Internal Medicine

## 2017-05-31 VITALS — BP 146/80 | HR 46 | Temp 97.5°F | Resp 16 | Wt 122.0 lb

## 2017-05-31 DIAGNOSIS — R030 Elevated blood-pressure reading, without diagnosis of hypertension: Secondary | ICD-10-CM

## 2017-05-31 DIAGNOSIS — E785 Hyperlipidemia, unspecified: Secondary | ICD-10-CM | POA: Insufficient documentation

## 2017-05-31 DIAGNOSIS — Z9889 Other specified postprocedural states: Secondary | ICD-10-CM | POA: Insufficient documentation

## 2017-05-31 DIAGNOSIS — R42 Dizziness and giddiness: Secondary | ICD-10-CM | POA: Insufficient documentation

## 2017-05-31 DIAGNOSIS — K219 Gastro-esophageal reflux disease without esophagitis: Secondary | ICD-10-CM | POA: Insufficient documentation

## 2017-05-31 DIAGNOSIS — Z79899 Other long term (current) drug therapy: Secondary | ICD-10-CM | POA: Insufficient documentation

## 2017-05-31 DIAGNOSIS — R001 Bradycardia, unspecified: Secondary | ICD-10-CM

## 2017-05-31 DIAGNOSIS — M1711 Unilateral primary osteoarthritis, right knee: Secondary | ICD-10-CM | POA: Insufficient documentation

## 2017-05-31 DIAGNOSIS — R51 Headache: Secondary | ICD-10-CM | POA: Insufficient documentation

## 2017-05-31 DIAGNOSIS — I10 Essential (primary) hypertension: Secondary | ICD-10-CM | POA: Insufficient documentation

## 2017-05-31 DIAGNOSIS — K388 Other specified diseases of appendix: Secondary | ICD-10-CM | POA: Insufficient documentation

## 2017-05-31 DIAGNOSIS — F329 Major depressive disorder, single episode, unspecified: Secondary | ICD-10-CM | POA: Insufficient documentation

## 2017-05-31 DIAGNOSIS — Z833 Family history of diabetes mellitus: Secondary | ICD-10-CM | POA: Insufficient documentation

## 2017-05-31 DIAGNOSIS — Z96653 Presence of artificial knee joint, bilateral: Secondary | ICD-10-CM | POA: Insufficient documentation

## 2017-05-31 HISTORY — DX: Bradycardia, unspecified: R00.1

## 2017-05-31 NOTE — Addendum Note (Signed)
Addended by: Octaviano Glow on: 05/31/2017 02:53 PM   Modules accepted: Orders

## 2017-05-31 NOTE — Progress Notes (Signed)
Patient ID: Arva Slaugh, female    DOB: [birth year]  MRN: [id number]  CC: Follow-up (4 month)   Subjective: Tina Johnson is a 52 y.o. female who presents for chronic ds managment Her concerns today include:  Pt with hx of HL, OA knee, HTN (benign, being observed off med). My NP student is with me today who is fluent in [place] so pt chose to communicate in [place] with [facility] interpreting  1.  Had c-scope by Dr. Henrene Pastor 02/2017.  This revealed mucocele in appendix.  Referred to Dr. Dahlia Byes, gen surgeon, who did cecectomy to remove what by pathology was a low-grade appendiceal mucinous neoplasm.  Negative surgical margins. Pt referred to surgical oncology at [facility] in [place].  She was called last wk by a lady called Christina bu did not fully understand the conversation.  She does have this pt's phone number   2.  HL: compliant with Pravachol.   3.  Bradycardia:  Noted today and intermittent on review of previous vitals -+ dizziness with position changes which has been chronic for her.  No tiredness or syncope Patient Active Problem List   Diagnosis Date Noted  . Neoplasm of appendix   . S/P TKR (total knee replacement), bilateral 10/07/2016  . 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 Family history of diabetes mellitus (DM) 01/23/2014  . Right knee DJD 01/23/2014  . Hyperlipidemia 01/23/2014  . HYPERLIPIDEMIA 08/14/2007  . DEPRESSION 08/14/2007  . GERD 08/14/2007  . HEADACHE, CHRONIC 08/14/2007     Current Outpatient Medications on File Prior to Visit  Medication Sig Dispense Refill  . Cholecalciferol (VITAMIN D3) 2000 units TABS Take 1 capsule by mouth every other day.    . conjugated estrogens (PREMARIN) vaginal cream Place 1 g vaginally 2 (two) times a week. Monday & Thursday    . GARLIC-CALCIUM PO Take 1 tablet by mouth daily.    . OMEGA-3 FATTY ACIDS PO Take 2 capsules by mouth 2 (two) times daily.    . pravastatin  (PRAVACHOL) 20 MG tablet Take 1 tablet (20 mg total) by mouth daily. 90 tablet 0   No current facility-administered medications on file prior to visit.     Allergies  Allergen Reactions  . Latex Rash    Social History   Socioeconomic History  . Marital status: Married    Spouse name: Not on file  . Number of children: Not on file  . Years of education: Not on file  . Highest education level: Not on file  Social Needs  . Financial resource strain: Not on file  . Food insecurity - worry: Not on file  . Food insecurity - inability: Not on file  . Transportation needs - medical: Not on file  . Transportation needs - non-medical: Not on file  Occupational History  . Not on file  Tobacco Use  . Smoking status: Never Smoker  . Smokeless tobacco: Never Used  Substance and Sexual Activity  . Alcohol use: No  . Drug use: No  . Sexual activity: Not on file  Other Topics Concern  . Not on file  Social History Narrative  . Not on file    Family History  Problem Relation Age of Onset  . Diabetes Sister   . Diabetes Brother   . Diabetes Mother   . Colon cancer Neg Hx   . Cancer Neg Hx     Past Surgical History:  Procedure Laterality Date  .  ABDOMINAL HYSTERECTOMY    . JOINT REPLACEMENT Bilateral 2016   [place]  . LAPAROSCOPIC APPENDECTOMY N/A 05/02/2017   Procedure: LAPAROSCOPIC CECECTOMY;  Surgeon: Jules Husbands, MD;  Location: [facility] ORS;  Service: General;  Laterality: N/A;  Please open Lap appy tray.  Have gelport and Right hemicolectomy cart in the room.   . TOTAL ABDOMINAL HYSTERECTOMY W/ BILATERAL SALPINGOOPHORECTOMY  2001   done at [facility]  . TOTAL KNEE ARTHROPLASTY Right 04/03/2015   Procedure: RIGHT TOTAL KNEE ARTHROPLASTY;  Surgeon: Leandrew Koyanagi, MD;  Location: [place];  Service: Orthopedics;  Laterality: Right;  . TOTAL KNEE ARTHROPLASTY Left 04/03/2015   Procedure: LEFT TOTAL KNEE ARTHROPLASTY;  Surgeon: Leandrew Koyanagi, MD;  Location: [place];  Service:  Orthopedics;  Laterality: Left;  Marland Kitchen VAGINAL DELIVERY     ?x2    ROS: Review of Systems As above PHYSICAL EXAM: BP (!) 149/77   Pulse (!) 46   Temp (!) 97.5 F (36.4 C) (Oral)   Resp 16   Wt 122 lb (55.3 kg)   SpO2 99%   BMI 29.42 kg/m   BP 146/80 Physical Exam  General appearance - alert, well appearing, and in no distress Mental status - alert, oriented to person, place, and time, normal mood, behavior, speech, dress, motor activity, and thought processes Chest - clear to auscultation, no wheezes, rales or rhonchi, symmetric air entry Heart - normal rate, regular rhythm, normal S1, S2, no murmurs, rubs, clicks or gallops  ASSESSMENT AND PLAN: 1. Mucocele of appendix S/p  cecectomy to remove what by pathology was a low-grade appendiceal mucinous neoplasm.  Pt does have a family member at home who speaks english.  She will have this person call Christina at [facility] to clarify if she has appt with surgical oncologist there. -Dr. Henrene Pastor plans to do repeat colonoscopy in 1 yr  2. Bradycardia EKG with sinus brady in the 40s with positional dizziness.  Will have her eval by cardiology and do TSH.  3. Elevated blood pressure reading -recommend low salt.  Nurse only BP check in 1 mth  Patient was given the opportunity to ask questions.  Patient verbalized understanding of the plan and was able to repeat key elements of the plan.   No orders of the defined types were placed in this encounter.    Requested Prescriptions    No prescriptions requested or ordered in this encounter    No Follow-up on file.  Karle Plumber, MD, FACP

## 2017-06-01 LAB — TSH: TSH: 3.35 u[IU]/mL (ref 0.450–4.500)

## 2017-06-03 NOTE — Telephone Encounter (Signed)
Okay thank you

## 2017-06-03 NOTE — Telephone Encounter (Signed)
I have contacted Christina with [facility] Surgical Oncology to check the status of the patient's referral. Margreta Journey stated that the patient had an appointment today with the Financial Assistance Department to determine if she is eligible. Once patient is approved they will then make an appointment for her to see a provider. I will follow back up with Christina @ [phone] next week.

## 2017-06-10 ENCOUNTER — Ambulatory Visit: Payer: Self-pay | Attending: Internal Medicine

## 2017-06-10 NOTE — Telephone Encounter (Signed)
I have spoke with [place]. She stated that the patient did show for her appointment for financial assistance. All documents have been received and is in the process of being reviewed. Once this is reviewed she will be called for a new patient visit with surgical oncology. I advised Margreta Journey that I will follow back up in a couple weeks.

## 2017-06-20 ENCOUNTER — Ambulatory Visit: Payer: Self-pay | Attending: Internal Medicine

## 2017-06-28 ENCOUNTER — Other Ambulatory Visit: Payer: Self-pay | Admitting: Internal Medicine

## 2017-06-28 ENCOUNTER — Ambulatory Visit: Payer: Self-pay | Attending: Internal Medicine | Admitting: *Deleted

## 2017-06-28 VITALS — BP 154/81 | HR 50

## 2017-06-28 DIAGNOSIS — R030 Elevated blood-pressure reading, without diagnosis of hypertension: Secondary | ICD-10-CM

## 2017-06-28 DIAGNOSIS — I10 Essential (primary) hypertension: Secondary | ICD-10-CM | POA: Insufficient documentation

## 2017-06-28 MED ORDER — AMLODIPINE BESYLATE 5 MG PO TABS: 5.0000 mg | ORAL_TABLET | Freq: Every day | ORAL | 3 refills | Status: DC

## 2017-06-28 MED FILL — AMLODIPINE BESYLATE 5 MG TA: 5 | 30 days supply | Qty: 30 | Fill #0

## 2017-06-28 NOTE — Progress Notes (Signed)
Pt arrived to [facility] for blood pressure check. Pt alert and oriented and arrives in good spirits. Her last OV 05/31/2017, pt blood pressure was  146/80, Pulse 46.   Per OV note 05/31/2017: recommend low salt. Nurse only BP check in 1 month.   Pt denies chest pain, SOB, HA, dizziness, or blurred vision.  Pt does not take any medications for BP.    Blood pressure reading:148/80 and 154/81 Pulse: 50 and 52  Plan-  Begin Norvasc 5mg   Recheck in 6 weeks with Dr. Wynetta Emery

## 2017-06-28 NOTE — Patient Instructions (Signed)
Plan-  Begin Norvasc 5mg   Recheck in 6 weeks with Dr. Wynetta Emery

## 2017-07-26 MED FILL — AMLODIPINE BESYLATE 5 MG TA: 5 | 30 days supply | Qty: 30 | Fill #1

## 2017-08-09 ENCOUNTER — Ambulatory Visit: Payer: No Typology Code available for payment source | Attending: Internal Medicine | Admitting: Internal Medicine

## 2017-08-09 ENCOUNTER — Encounter: Payer: Self-pay | Admitting: Internal Medicine

## 2017-08-09 VITALS — BP 128/60 | HR 46 | Temp 97.5°F | Resp 100 | Wt 122.0 lb

## 2017-08-09 DIAGNOSIS — R001 Bradycardia, unspecified: Secondary | ICD-10-CM | POA: Insufficient documentation

## 2017-08-09 DIAGNOSIS — R102 Pelvic and perineal pain: Secondary | ICD-10-CM | POA: Insufficient documentation

## 2017-08-09 DIAGNOSIS — E785 Hyperlipidemia, unspecified: Secondary | ICD-10-CM | POA: Insufficient documentation

## 2017-08-09 DIAGNOSIS — I10 Essential (primary) hypertension: Secondary | ICD-10-CM | POA: Insufficient documentation

## 2017-08-09 DIAGNOSIS — M1710 Unilateral primary osteoarthritis, unspecified knee: Secondary | ICD-10-CM | POA: Insufficient documentation

## 2017-08-09 DIAGNOSIS — Z79899 Other long term (current) drug therapy: Secondary | ICD-10-CM | POA: Insufficient documentation

## 2017-08-09 DIAGNOSIS — Z96653 Presence of artificial knee joint, bilateral: Secondary | ICD-10-CM | POA: Insufficient documentation

## 2017-08-09 NOTE — Progress Notes (Signed)
Pt states she is having pelvic pain  Pt states she has a pump by her vagina

## 2017-08-09 NOTE — Progress Notes (Signed)
Patient ID: Tina Johnson, female    DOB: 1965/10/08  MRN: [id number]  CC: Follow-up   Subjective: Tina Johnson is a 52 y.o. female who presents for chronic ds management. Interpreter with [employer], [place], is present and interprets. Her concerns today include:  Pt with hx of HL, OA knee, HTN  1.  Bradycardia:  Has appt with cardiology later this wk. She is worried about her heart. -BP remained elevated so we started her on Norvasc which she takes daily. No CP, SOB or dizziness.  Has wrist cuff but she does not think it is accurate  -admits that she has not cut back on salt as she should.   Not exercising as yet due to recent bowel surgery 05/02/2017  2.  HL:  Compliant with Pravachol  3.  C/O feeling a ball on pelvic bone near the vagina R side x 5 mths.  No vaginal dischg, rash. She has had hysterectomy with removal of ovaries in 2002 for severe infection in tubes.  Patient Active Problem List   Diagnosis Date Noted  . Bradycardia 05/31/2017  . Neoplasm of appendix   . S/P TKR (total knee replacement), bilateral 10/07/2016  . 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 Family history of diabetes mellitus (DM) 01/23/2014  . Right knee DJD 01/23/2014  . Hyperlipidemia 01/23/2014  . HYPERLIPIDEMIA 08/14/2007  . DEPRESSION 08/14/2007  . GERD 08/14/2007  . HEADACHE, CHRONIC 08/14/2007     Current Outpatient Medications on File Prior to Visit  Medication Sig Dispense Refill  . amLODipine (NORVASC) 5 MG tablet Take 1 tablet (5 mg total) by mouth daily. 90 tablet 3  . Cholecalciferol (VITAMIN D3) 2000 units TABS Take 1 capsule by mouth every other day.    . conjugated estrogens (PREMARIN) vaginal cream Place 1 g vaginally 2 (two) times a week. Monday & Thursday    . GARLIC-CALCIUM PO Take 1 tablet by mouth daily.    . OMEGA-3 FATTY ACIDS PO Take 2 capsules by mouth 2 (two) times daily.    . pravastatin (PRAVACHOL)  20 MG tablet Take 1 tablet (20 mg total) by mouth daily. 90 tablet 0   No current facility-administered medications on file prior to visit.     Allergies  Allergen Reactions  . Latex Rash    Social History   Socioeconomic History  . Marital status: Married    Spouse name: Not on file  . Number of children: Not on file  . Years of education: Not on file  . Highest education level: Not on file  Occupational History  . Not on file  Social Needs  . Financial resource strain: Not on file  . Food insecurity:    Worry: Not on file    Inability: Not on file  . Transportation needs:    Medical: Not on file    Non-medical: Not on file  Tobacco Use  . Smoking status: Never Smoker  . Smokeless tobacco: Never Used  Substance and Sexual Activity  . Alcohol use: No  . Drug use: No  . Sexual activity: Not on file  Lifestyle  . Physical activity:    Days per week: Not on file    Minutes per session: Not on file  . Stress: Not on file  Relationships  . Social connections:    Talks on phone: Not on file    Gets together: Not on file    Attends religious service: Not  on file    Active member of club or organization: Not on file    Attends meetings of clubs or organizations: Not on file    Relationship status: Not on file  . Intimate partner violence:    Fear of current or ex partner: Not on file    Emotionally abused: Not on file    Physically abused: Not on file    Forced sexual activity: Not on file  Other Topics Concern  . Not on file  Social History Narrative  . Not on file    Family History  Problem Relation Age of Onset  . Diabetes Sister   . Diabetes Brother   . Diabetes Mother   . Colon cancer Neg Hx   . Cancer Neg Hx     Past Surgical History:  Procedure Laterality Date  . ABDOMINAL HYSTERECTOMY    . JOINT REPLACEMENT Bilateral 2016   [place]  . LAPAROSCOPIC APPENDECTOMY N/A 05/02/2017   Procedure: LAPAROSCOPIC CECECTOMY;  Surgeon: Jules Husbands, MD;   Location: [facility] ORS;  Service: General;  Laterality: N/A;  Please open Lap appy tray.  Have gelport and Right hemicolectomy cart in the room.   . TOTAL ABDOMINAL HYSTERECTOMY W/ BILATERAL SALPINGOOPHORECTOMY  2001   done at [facility]  . TOTAL KNEE ARTHROPLASTY Right 04/03/2015   Procedure: RIGHT TOTAL KNEE ARTHROPLASTY;  Surgeon: Leandrew Koyanagi, MD;  Location: [place];  Service: Orthopedics;  Laterality: Right;  . TOTAL KNEE ARTHROPLASTY Left 04/03/2015   Procedure: LEFT TOTAL KNEE ARTHROPLASTY;  Surgeon: Leandrew Koyanagi, MD;  Location: [place];  Service: Orthopedics;  Laterality: Left;  Marland Kitchen VAGINAL DELIVERY     ?x2    ROS: Review of Systems Neg except as above  PHYSICAL EXAM: BP 128/60   Pulse (!) 46   Temp (!) 97.5 F (36.4 C) (Oral)   Resp (!) 100   Wt 122 lb (55.3 kg)   HC 16" (40.6 cm)   BMI 29.42 kg/m   Physical Exam  General appearance - alert, well appearing, and in no distress Mental status - alert, oriented to person, place, and time, normal mood, behavior, speech, dress, motor activity, and thought processes Chest - clear to auscultation, no wheezes, rales or rhonchi, symmetric air entry Heart - normal rate, regular rhythm, normal S1, S2, no murmurs, rubs, clicks or gallops Pelvic -no external vaginal lesions seen.  No inguinal lymphadenopathy.  Palpable area of concern is the tendon extending from the pubic bone on left side.  No lumps or abnormal mass appreciated Extremities - peripheral pulses normal, no pedal edema, no clubbing or cyanosis  ASSESSMENT AND PLAN: 1. Essential hypertension At goal.  Continue Norvasc.  Encourage DASH diet.  2. Vaginal pain Reassurance given  3. Bradycardia Keep appointment with cardiology later this month   Patient was given the opportunity to ask questions.  Patient verbalized understanding of the plan and was able to repeat key elements of the plan.   No orders of the defined types were placed in this encounter.    Requested  Prescriptions    No prescriptions requested or ordered in this encounter    Return in about 3 months (around 11/08/2017).  Tina Plumber, MD, FACP

## 2017-08-10 ENCOUNTER — Telehealth: Payer: Self-pay | Admitting: Surgery

## 2017-08-10 NOTE — Telephone Encounter (Signed)
Patient was referred to [facility] to have consultation for the mucocele appendix, however she was denied due to no insurance, even though she was approved for the [facility]. Dr Dahlia Byes would like to have her seen at [place] so that this can continued to be followed up. A referral has been sent and the patient is aware of this. The cancer center will contact the patient for an appointment. Patient advised to call our office if she does not hear from the [place] within a week.

## 2017-08-11 ENCOUNTER — Ambulatory Visit (INDEPENDENT_AMBULATORY_CARE_PROVIDER_SITE_OTHER): Payer: Self-pay | Admitting: Cardiovascular Disease

## 2017-08-11 ENCOUNTER — Encounter: Payer: Self-pay | Admitting: Cardiovascular Disease

## 2017-08-11 VITALS — BP 122/82 | HR 59 | Ht <= 58 in | Wt 122.4 lb

## 2017-08-11 DIAGNOSIS — I10 Essential (primary) hypertension: Secondary | ICD-10-CM

## 2017-08-11 DIAGNOSIS — R001 Bradycardia, unspecified: Secondary | ICD-10-CM

## 2017-08-11 NOTE — Telephone Encounter (Signed)
Patient was denied for the HIPEC prodecure at [facility]. Patient has decided that she would like to be referred to [facility] cancer center. I have spoke with Dr Dahlia Byes and he agreed with this. Referral has been sent and patient was contacted with an appointment.

## 2017-08-11 NOTE — Patient Instructions (Signed)
Medication Instructions:  Your physician recommends that you continue on your current medications as directed. Please refer to the Current Medication list given to you today.   Labwork: None Ordered   Testing/Procedures: None Ordered   Follow-Up: Your physician recommends that you schedule a follow-up appointment in: as needed with Dr. Nahser   If you need a refill on your cardiac medications before your next appointment, please call your pharmacy.   Thank you for choosing [facility]! Penny Frisbie, RN [phone]    

## 2017-08-11 NOTE — Progress Notes (Signed)
Cardiology Office Note:    Date:  08/11/2017   ID:  Tina Johnson, DOB 03/04/1966, MRN [id number]  PCP:  Ladell Pier, MD  Cardiologist:  No primary care provider on file.   Referring MD: Ladell Pier, MD   Chief Complaint  Patient presents with  . Bradycardia    History of Present Illness:    Tina Johnson is a 52 y.o. female with a hx of bradycardia hyperlipidemia. Seen with [name].   Was found by her primary MD to have a slow HR .  Has occasional episodes of lightheadness and fatigue.   Has CP under her left breast  Does not get any regular exercise  Had surgery on appendix 3 months ago so has not been exercising  No family hx of pacer . No syncope or presyncope   Does not know how long the HR has been slow   She has been very anxious about this slow HR   Past Medical History:  Diagnosis Date  . Abnormality of gait 04/26/2014  . Anxiety    controls with exercise   . Arthritis    knees , shoulders  . Bradycardia 05/31/2017  . DEPRESSION 08/14/2007   Qualifier: Diagnosis of  By: Bertram Gala    . Family history of diabetes mellitus (DM) 01/23/2014  . 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 GERD 08/14/2007   Qualifier: Diagnosis of  By: Bertram Gala    . HEADACHE, CHRONIC 08/14/2007   Qualifier: Diagnosis of  By: Bertram Gala    . HYPERLIPIDEMIA 08/14/2007   Qualifier: Diagnosis of  By: Bertram Gala    . Indigestion    no medicine at present  . 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 Neoplasm of appendix   . OSTEOARTHRITIS 08/14/2007   Qualifier: Diagnosis of  By: Bertram Gala    . Other bilateral secondary osteoarthritis of knee 04/03/2015  . PONV (postoperative nausea and vomiting) 1999   partial hysterectomy in [place]  . Right knee DJD 01/23/2014  . 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  ABDOMINAL HYSTERECTOMY    . JOINT REPLACEMENT Bilateral 2016   [place]  . LAPAROSCOPIC APPENDECTOMY N/A 05/02/2017   Procedure: LAPAROSCOPIC CECECTOMY;  Surgeon: Jules Husbands, MD;  Location: [facility];  Service: General;  Laterality: N/A;  Please open Lap appy tray.  Have gelport and Right hemicolectomy cart in the room.   . TOTAL ABDOMINAL HYSTERECTOMY W/ BILATERAL SALPINGOOPHORECTOMY  2001   done at [facility]  . TOTAL KNEE ARTHROPLASTY Right 04/03/2015   Procedure: RIGHT TOTAL KNEE ARTHROPLASTY;  Surgeon: Leandrew Koyanagi, MD;  Location: [place];  Service: Orthopedics;  Laterality: Right;  . TOTAL KNEE ARTHROPLASTY Left 04/03/2015   Procedure: LEFT TOTAL KNEE ARTHROPLASTY;  Surgeon: Leandrew Koyanagi, MD;  Location: [place];  Service: Orthopedics;  Laterality: Left;  Marland Kitchen VAGINAL DELIVERY     ?x2    Current Medications: Current Meds  Medication Sig  . amLODipine (NORVASC) 5 MG tablet Take 1 tablet (5 mg total) by mouth daily.  . Cholecalciferol (VITAMIN D3) 2000 units TABS Take 1 capsule by mouth every other day.  . conjugated estrogens (PREMARIN) vaginal cream Place 1 g vaginally 2 (two) times a week. Monday & Thursday  . GARLIC-CALCIUM PO Take 1 tablet by mouth daily.  . OMEGA-3 FATTY ACIDS  PO Take 2 capsules by mouth 2 (two) times daily.  . pravastatin (PRAVACHOL) 20 MG tablet Take 1 tablet (20 mg total) by mouth daily.     Allergies:   Latex   Social History   Socioeconomic History  . Marital status: Married    Spouse name: Not on file  . Number of children: Not on file  . Years of education: Not on file  . Highest education level: Not on file  Occupational History  . Not on file  Social Needs  . Financial resource strain: Not on file  . Food insecurity:    Worry: Not on file    Inability: Not on file  . Transportation needs:    Medical: Not on file    Non-medical: Not on file  Tobacco Use  . Smoking status: Never Smoker  . Smokeless tobacco: Never Used  Substance and  Sexual Activity  . Alcohol use: No  . Drug use: No  . Sexual activity: Not on file  Lifestyle  . Physical activity:    Days per week: Not on file    Minutes per session: Not on file  . Stress: Not on file  Relationships  . Social connections:    Talks on phone: Not on file    Gets together: Not on file    Attends religious service: Not on file    Active member of club or organization: Not on file    Attends meetings of clubs or organizations: Not on file    Relationship status: Not on file  Other Topics Concern  . Not on file  Social History Narrative  . Not on file     Family History: The patient's family history includes Diabetes in her brother, mother, and sister. There is no history of Colon cancer or Cancer.  ROS:   Please see the history of present illness.     All other systems reviewed and are negative.  EKGs/Labs/Other Studies Reviewed:    The following studies were reviewed today:   EKG:  2/12/ 19 :  Sinus bradycardia  At 47  30 second step test - HR increased to 70 .   Recent Labs: 03/26/2017: ALT 21; BUN 20; Creatinine, Ser 0.49; Hemoglobin 13.7; Platelets 281; Potassium 4.2; Sodium 138 05/31/2017: TSH 3.350  Recent Lipid Panel    Component Value Date/Time   CHOL 188 10/29/2016 1139   TRIG 74 10/29/2016 1139   HDL 60 10/29/2016 1139   CHOLHDL 3.1 10/29/2016 1139   CHOLHDL 3.8 01/21/2016 0921   VLDL 20 01/21/2016 0921   LDLCALC 113 (H) 10/29/2016 1139    Physical Exam:    VS:  BP 122/82   Pulse (!) 59   Ht 4\' 6"  (1.372 m)   Wt 122 lb 6.4 oz (55.5 kg)   SpO2 99%   BMI 29.51 kg/m     Wt Readings from Last 3 Encounters:  08/11/17 122 lb 6.4 oz (55.5 kg)  08/09/17 122 lb (55.3 kg)  05/31/17 122 lb (55.3 kg)     GEN:  Well nourished, well developed in no acute distress HEENT: Normal NECK: No JVD; No carotid bruits LYMPHATICS: No lymphadenopathy CARDIAC: RRR, no murmurs, rubs, gallops RESPIRATORY:  Clear to auscultation without rales,  wheezing or rhonchi  ABDOMEN: Soft, non-tender, non-distended MUSCULOSKELETAL:  No edema; No deformity  SKIN: Warm and dry NEUROLOGIC:  Alert and oriented x 3 PSYCHIATRIC:  Normal affect   ASSESSMENT:    No diagnosis found. PLAN:  In order of problems listed above:  1. 1.  Sinus bradycardia: Patient presents with asymptomatic benign sinus bradycardia.  Her EKG shows normal conduction between the atria and the ventricle.  Her heart rate increases normally with a step test.  At this point I do not think that she needs any additional testing.  I reassured her that her cardiac function appears to be normal.  I have asked her to call if she has any episodes of syncope or presyncope.  See her on an as-needed basis.   Medication Adjustments/Labs and Tests Ordered: Current medicines are reviewed at length with the patient today.  Concerns regarding medicines are outlined above.  No orders of the defined types were placed in this encounter.  No orders of the defined types were placed in this encounter.   There are no Patient Instructions on file for this visit.   Signed, Mertie Moores, MD  08/11/2017 2:27 PM    [place]

## 2017-08-15 ENCOUNTER — Inpatient Hospital Stay: Payer: No Typology Code available for payment source | Attending: Internal Medicine | Admitting: Internal Medicine

## 2017-08-15 ENCOUNTER — Encounter: Payer: Self-pay | Admitting: Internal Medicine

## 2017-08-15 ENCOUNTER — Other Ambulatory Visit: Payer: Self-pay

## 2017-08-15 VITALS — BP 130/76 | HR 59 | Temp 97.7°F | Resp 18 | Ht <= 58 in | Wt 123.6 lb

## 2017-08-15 DIAGNOSIS — M129 Arthropathy, unspecified: Secondary | ICD-10-CM | POA: Insufficient documentation

## 2017-08-15 DIAGNOSIS — Z90710 Acquired absence of both cervix and uterus: Secondary | ICD-10-CM

## 2017-08-15 DIAGNOSIS — C181 Malignant neoplasm of appendix: Secondary | ICD-10-CM

## 2017-08-15 DIAGNOSIS — R000 Tachycardia, unspecified: Secondary | ICD-10-CM | POA: Insufficient documentation

## 2017-08-15 DIAGNOSIS — E785 Hyperlipidemia, unspecified: Secondary | ICD-10-CM | POA: Insufficient documentation

## 2017-08-15 DIAGNOSIS — K219 Gastro-esophageal reflux disease without esophagitis: Secondary | ICD-10-CM | POA: Insufficient documentation

## 2017-08-15 DIAGNOSIS — F418 Other specified anxiety disorders: Secondary | ICD-10-CM | POA: Insufficient documentation

## 2017-08-15 DIAGNOSIS — D373 Neoplasm of uncertain behavior of appendix: Secondary | ICD-10-CM

## 2017-08-15 DIAGNOSIS — Z801 Family history of malignant neoplasm of trachea, bronchus and lung: Secondary | ICD-10-CM | POA: Insufficient documentation

## 2017-08-15 DIAGNOSIS — Z86010 Personal history of colonic polyps: Secondary | ICD-10-CM | POA: Insufficient documentation

## 2017-08-15 NOTE — Progress Notes (Signed)
Introduced Therapist, nutritional. Provided contact information for future needs. We will contact her Thursday after case conference with updated plan of care. Will need interpreter for phone call. Oncology Nurse Navigator Documentation  Navigator Location: [facility] (08/15/17 1600)   )Navigator Encounter Type: Initial MedOnc (08/15/17 1600)                     Patient Visit Type: MedOnc;Initial (08/15/17 1600) Treatment Phase: Pre-Tx/Tx Discussion (08/15/17 1600) Barriers/Navigation Needs: No barriers at this time;Coordination of Care (08/15/17 1600)                Acuity: Level 2 (08/15/17 1600)         Time Spent with Patient: 30 (08/15/17 1600)

## 2017-08-15 NOTE — Progress Notes (Signed)
Here for new pt evaluation.  Here w interpretor Bonnita Nasuti.

## 2017-08-15 NOTE — Progress Notes (Signed)
[facility] CONSULT NOTE  Patient Care Team: Ladell Pier, MD as PCP - General (Internal Medicine) Nahser, Wonda Cheng, MD as PCP - Cardiology (Cardiology) Clent Jacks, RN as Registered Nurse  CHIEF COMPLAINTS/PURPOSE OF CONSULTATION:  Appendiceal cancer  #  Oncology History   # April 2019- Low Grade appendiceal mucinous neoplasm [s/p lap appen & Cecectomy; Dr.Pabon]; G-1; T=2.9cm;  pT3p [invades through the muscularis propria into the subserosa or mesoappendix]Nx;Neg-Margins; No LVI/PNI;   # [place] dec 2018   # TAH& BSO [2001] ? 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  female has been referred to [place] for further evaluation recommendations for newly diagnosed low-grade mucinous neoplasm of the appendix.  Patient states that she had a screening colonoscopy in November 2018-that showed a small cecal polyp/and also sub-mucosal appendiceal mass;however biopsy/pathology was negative.  Patient however noted to have intermittent right lower quadrant abdominal pain-which led to a CT scan in December 2018 that showed thickening of the appendix/suspicion of mucocele.  Patient  had a laparoscopic appendectomy-that showed surprising findings of low-grade appendiceal mucinous neoplasm [pathology details as above]  Patient is recovering fairly well from surgery; however she complains of intermittent right lower and left lower quadrant abdominal pain.  Denies any diarrhea constipation.  No chest pain or shortness of breath cough.  No weight loss.  No abdominal distention.  No new tingling or numbness in extremities.  ROS: A complete 10 point review of system is done which is negative except mentioned above in history of present illness  MEDICAL HISTORY:  Past Medical History:  Diagnosis Date  .  Abnormality of gait 04/26/2014  . Anxiety    controls with exercise   . Arthritis    knees , shoulders  . Bradycardia 05/31/2017  . DEPRESSION 08/14/2007   Qualifier: Diagnosis of  By: Bertram Gala    . Family history of diabetes mellitus (DM) 01/23/2014  . 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 GERD 08/14/2007   Qualifier: Diagnosis of  By: Bertram Gala    . HEADACHE, CHRONIC 08/14/2007   Qualifier: Diagnosis of  By: Bertram Gala    . HYPERLIPIDEMIA 08/14/2007   Qualifier: Diagnosis of  By: Bertram Gala    . Indigestion    no medicine at present  . 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 Neoplasm of appendix   . OSTEOARTHRITIS 08/14/2007   Qualifier: Diagnosis of  By: Bertram Gala    . Other bilateral secondary osteoarthritis of knee 04/03/2015  . PONV (postoperative nausea and vomiting) 1999   partial hysterectomy in [place]  . Right knee DJD 01/23/2014  . S/P TKR (total knee replacement), bilateral 10/07/2016    SURGICAL HISTORY: Past Surgical History:  Procedure Laterality Date  . ABDOMINAL HYSTERECTOMY    . APPENDECTOMY    . JOINT REPLACEMENT Bilateral 2016   [place]  . LAPAROSCOPIC APPENDECTOMY N/A 05/02/2017   Procedure: LAPAROSCOPIC CECECTOMY;  Surgeon: Jules Husbands, MD;  Location: [facility];  Service: General;  Laterality: N/A;  Please open Lap appy tray.  Have gelport and Right hemicolectomy cart in the room.   . TOTAL ABDOMINAL HYSTERECTOMY W/ BILATERAL SALPINGOOPHORECTOMY  2001   done at [facility]  . TOTAL  KNEE ARTHROPLASTY Right 04/03/2015   Procedure: RIGHT TOTAL KNEE ARTHROPLASTY;  Surgeon: Leandrew Koyanagi, MD;  Location: [place] [facility];  Service: Orthopedics;  Laterality: Right;  . 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 Marital status: Married    Spouse name: Not on file  . Number of children: Not on file  . Years of education: Not on file  . Highest education level: Not on file  Occupational History  . Not on file  Social Needs  . Financial resource strain: Not on file  . Food insecurity:    Worry: Not on file    Inability: Not on file  . Transportation needs:    Medical: Not on file    Non-medical: Not on file  Tobacco Use  . Smoking status: Never Smoker  . Smokeless tobacco: Never Used  Substance and Sexual Activity  . Alcohol use: No  . Drug use: No  . Sexual activity: Not Currently  Lifestyle  . Physical activity:    Days per week: Not on file    Minutes per session: Not on file  . Stress: Not on file  Relationships  . Social connections:    Talks on phone: Not on file    Gets together: Not on file    Attends religious service: Not on file    Active member of club or organization: Not on file    Attends meetings of clubs or organizations: Not on file    Relationship status: Not on file  . Intimate partner violence:    Fear of current or ex partner: Not on file    Emotionally abused: Not on file    Physically abused: Not on file    Forced sexual activity: Not on file  Other Topics Concern  . Not on file  Social History Narrative  . Not on file    FAMILY HISTORY:  Family History  Problem Relation Age of Onset  . Diabetes Sister   . Diabetes Brother   . Diabetes Mother   . Diabetes Sister   . Diabetes Sister   . Diabetes Brother   . Diabetes Brother   . Colon cancer Neg Hx   . Cancer Neg Hx     ALLERGIES:  is allergic to latex.  MEDICATIONS:  Current Outpatient Medications  Medication Sig Dispense Refill  . amLODipine (NORVASC) 5 MG tablet Take 1 tablet (5 mg total) by mouth daily. 90 tablet 3  . conjugated estrogens (PREMARIN) vaginal cream Place 1 g vaginally 2 (two) times a  week. Monday & Thursday    . GARLIC-CALCIUM PO Take 1 tablet by mouth daily.    . OMEGA-3 FATTY ACIDS PO Take 2 capsules by mouth 2 (two) times daily.    . pravastatin (PRAVACHOL) 20 MG tablet Take 1 tablet (20 mg total) by mouth daily. 90 tablet 0  . Cholecalciferol (VITAMIN D3) 2000 units TABS Take 1 capsule by mouth every other day.     No current facility-administered medications for this visit.       Marland Kitchen  PHYSICAL EXAMINATION: ECOG PERFORMANCE STATUS: 0 - Asymptomatic  Vitals:   08/15/17 1520  BP: 130/76  Pulse: (!) 59  Resp: 18  Temp: 97.7 F (  36.5 C)   Filed Weights   08/15/17 1520  Weight: 123 lb 9.6 oz (56.1 kg)    GENERAL: Well-nourished well-developed; Alert, no distress and comfortable.   Accompanied by family. EYES: no pallor or icterus OROPHARYNX: no thrush or ulceration; good dentition  NECK: supple, no masses felt LYMPH:  no palpable lymphadenopathy in the cervical, axillary or inguinal regions LUNGS: clear to auscultation and  No wheeze or crackles HEART/CVS: regular rate & rhythm and no murmurs; No lower extremity edema ABDOMEN: abdomen soft, non-tender and normal bowel sounds Musculoskeletal:no cyanosis of digits and no clubbing  PSYCH: alert & oriented x 3 with fluent speech NEURO: no focal motor/sensory deficits SKIN:  no rashes or significant lesions  LABORATORY DATA:  I have reviewed the data as listed Lab Results  Component Value Date   WBC 8.7 03/26/2017   HGB 13.7 03/26/2017   HCT 40.4 03/26/2017   MCV 90.0 03/26/2017   PLT 281 03/26/2017   Recent Labs    10/29/16 1139 03/26/17 1409  NA 143 138  K 4.2 4.2  CL 104 103  CO2 25 26  GLUCOSE 93 93  BUN 21 20  CREATININE 0.51* 0.49  CALCIUM 9.6 9.7  GFRNONAA 111 >60  GFRAA 128 >60  PROT 7.4 7.6  ALBUMIN 4.3 4.0  AST 21 26  ALT 18 21  ALKPHOS 85 90  BILITOT 0.4 0.6    RADIOGRAPHIC STUDIES: I have personally reviewed the radiological images as listed and agreed with the  findings in the report. No results found.  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  Given the tumor size= 2.9 cm [> greater than 2.5]; and also PT 3 [invasion through muscularis propria into serosa] it is recommended that patient gets-completion right hemicolectomy.  Otherwise patient has low risk factors including-absence of LV I/PNI/grade 1/negative margins.  #Discussed that patient will likely not need any further adjuvant chemotherapy or radiation therapy.   #Patient was emotional after reviewing the pathology/and the next likely plan of care.  However, discussed with the patient that she will likely be cured-although pending final surgical pathology.  Reviewed with the patient that we will discuss the tumor conference/and give final recommendations.  Also discussed with Dr. Perrin Maltese.   #Flushing-likely secondary to postmenopausal /of antiestrogen therapy.   Thank you Dr.Pabone for allowing me to participate in the care of your pleasant patient. Please do not hesitate to contact me with questions or concerns in the interim.  # follow up TBD.   All questions were answered. The patient knows to call the clinic with any problems, questions or concerns.   Cammie Sickle, MD 08/15/2017 9:54 PM

## 2017-08-15 NOTE — Assessment & Plan Note (Addendum)
#  Low-grade mucinous appendiceal neoplasm-incidental finding status post appendectomy for possible mucocele based on CT scan.  Given the tumor size= 2.9 cm [> greater than 2.5]; and also PT 3 [invasion through muscularis propria into serosa]- completion right hemicolectomy/LN evaluation should be considered.   Otherwise patient has low risk factors including-absence of LV I/PNI/grade 1/negative margins. Will discuss at tumor conference.   #Discussed that patient will likely not need any further adjuvant chemotherapy or radiation therapy.   #Patient was emotional after reviewing the pathology/and the next likely plan of care.  However, discussed with the patient that she will likely be cured-although pending final surgical pathology.  Reviewed with the patient that we will discuss the tumor conference/and give final recommendations.  Also discussed with Dr. Perrin Maltese.   #Flushing-likely secondary to postmenopausal /of antiestrogen therapy.   Thank you Dr.Pabone for allowing me to participate in the care of your pleasant patient. Please do not hesitate to contact me with questions or concerns in the interim. Discussed with Dr.Pabone.   # 60 minutes face-to-face with the patient discussing the above plan of care; more than 50% of time spent on prognosis/ natural history; counseling and coordination.  # follow up TBD.

## 2017-08-18 ENCOUNTER — Telehealth: Payer: Self-pay

## 2017-08-18 NOTE — Telephone Encounter (Signed)
Unable to log into sharepoint at this time to get interpreter. Will call Ms Razo-Castro in the am regarding outcome of MDT. Oncology Nurse Navigator Documentation  Navigator Location: [facility] (08/18/17 1600)   )Navigator Encounter Type: Telephone (08/18/17 1600)                                                    Time Spent with Patient: 15 (08/18/17 1600)

## 2017-08-19 ENCOUNTER — Telehealth: Payer: Self-pay

## 2017-08-19 NOTE — Telephone Encounter (Signed)
Called and spoke with Ms. Tina Johnson with interpreter services. Updated that no consensus was made at MDT regarding further surgery. Dr. Rogue Bussing is going to reach out to some experts and we will call her next week with the plan. All questions answered to her satisfaction. Oncology Nurse Navigator Documentation  Navigator Location: [facility] (08/19/17 1100)   )Navigator Encounter Type: Telephone (08/19/17 1100) Telephone: Outgoing Call;Patient Update (08/19/17 1100)                                                  Time Spent with Patient: 30 (08/19/17 1100)

## 2017-08-29 ENCOUNTER — Telehealth: Payer: Self-pay

## 2017-08-29 ENCOUNTER — Telehealth: Payer: Self-pay | Admitting: Internal Medicine

## 2017-08-29 MED FILL — ?AMLODIPINE BESYLATE 5 MG T: 5 MG | 30 days supply | Qty: 30 | Fill #2

## 2017-08-29 NOTE — Telephone Encounter (Signed)
Ms. Tina Johnson was expecting call from [place] regarding plan of care last week. Any updates I can give her? Oncology Nurse Navigator Documentation  Navigator Location: [facility] (08/29/17 0800)   )Navigator Encounter Type: Other (08/29/17 0800)                                                    Time Spent with Patient: 15 (08/29/17 0800)

## 2017-08-30 ENCOUNTER — Encounter: Payer: Self-pay | Admitting: Internal Medicine

## 2017-08-30 ENCOUNTER — Ambulatory Visit (INDEPENDENT_AMBULATORY_CARE_PROVIDER_SITE_OTHER): Payer: Self-pay | Admitting: Internal Medicine

## 2017-08-30 VITALS — BP 142/74 | HR 74 | Ht <= 58 in | Wt 122.0 lb

## 2017-08-30 DIAGNOSIS — R140 Abdominal distension (gaseous): Secondary | ICD-10-CM

## 2017-08-30 DIAGNOSIS — D373 Neoplasm of uncertain behavior of appendix: Secondary | ICD-10-CM

## 2017-08-30 DIAGNOSIS — F411 Generalized anxiety disorder: Secondary | ICD-10-CM

## 2017-08-30 NOTE — Progress Notes (Signed)
HISTORY OF PRESENT ILLNESS:  Tina Johnson is a 52 y.o. female , non-English speaking, who self-referred today regarding a two-week history of abdominal bloating discomfort. She is accompanied by a professional interpreter. I initially saw the patient in 2008 for Hemoccult-positive stool. Colonoscopy and upper endoscopy at that time were normal. She had not been seen since until 03/17/2017 when she underwent routine screening colonoscopy directly. Examination revealed a diminutive cecal polyp which was non-adenomatous. If interest, there was a submucosal-appearing lesion at the appendiceal orifice. Multiple biopsies were obtained and returned showing benign low rectal mucosa with lymphoid aggregates. To further evaluate this endoscopic finding we arranged a CT scan of the abdomen and pelvis with contrast which was completed 03/26/2017. Examination revealed a 2.9 cm x 6.2 cm tubular fluid attenuation structure at the expected position of the appendix without other abnormality. She was felt possibly to have a mucocele. To rule out underlying malignancy she was referred to surgery and underwent laparoscopic cecectomy with Dr. Dahlia Byes at [facility] 05/02/2017. 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 The patient was referred to oncology on 08/15/2017. Based on the pathologic results completion right hemicolectomy was recommended and follow-up surgical evaluation is pending. Since the day of that office visit the patient has been concerned and upset. She reports problems with abdominal bloating discomfort. She is somewhat tearful today. She has tried to adjust her diet to avoid spicy foods which may be helping a little bit. She is moving her bowels regularly. She denies a prior history of similar problems with abdominal complaints. She is worried.  REVIEW OF SYSTEMS:  All non-GI ROS negative except for anxiety  Past Medical History:   Diagnosis Date  . Abnormality of gait 04/26/2014  . Anxiety    controls with exercise   . Arthritis    knees , shoulders  . Bradycardia 05/31/2017  . DEPRESSION 08/14/2007   Qualifier: Diagnosis of  By: Bertram Gala    . Family history of diabetes mellitus (DM) 01/23/2014  . 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 GERD 08/14/2007   Qualifier: Diagnosis of  By: Bertram Gala    . HEADACHE, CHRONIC 08/14/2007   Qualifier: Diagnosis of  By: Bertram Gala    . HYPERLIPIDEMIA 08/14/2007   Qualifier: Diagnosis of  By: Bertram Gala    . Indigestion    no medicine at present  . 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 Neoplasm of appendix   . OSTEOARTHRITIS 08/14/2007   Qualifier: Diagnosis of  By: Bertram Gala    . Other bilateral secondary osteoarthritis of knee 04/03/2015  . PONV (postoperative nausea and vomiting) 1999   partial hysterectomy in [place]  . Right knee DJD 01/23/2014  . 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 ABDOMINAL HYSTERECTOMY    . APPENDECTOMY    . JOINT REPLACEMENT Bilateral 2016   [place]  . LAPAROSCOPIC APPENDECTOMY N/A 05/02/2017   Procedure: LAPAROSCOPIC CECECTOMY;  Surgeon: Jules Husbands, MD;  Location: [facility];  Service: General;  Laterality: N/A;  Please open Lap appy tray.  Have gelport and Right hemicolectomy cart in the room.   . TOTAL ABDOMINAL HYSTERECTOMY W/ BILATERAL SALPINGOOPHORECTOMY  2001   done at [facility]  . TOTAL KNEE ARTHROPLASTY Right 04/03/2015   Procedure: RIGHT TOTAL KNEE ARTHROPLASTY;  Surgeon: Leandrew Koyanagi, MD;  Location: [place];  Service: Orthopedics;  Laterality: Right;  . TOTAL KNEE ARTHROPLASTY Left 04/03/2015   Procedure: LEFT TOTAL KNEE ARTHROPLASTY;  Surgeon: Leandrew Koyanagi, MD;  Location: [place];  Service: Orthopedics;  Laterality: Left;  Marland Kitchen VAGINAL DELIVERY     ?x2    Social History Bennett  Razo-Castro  reports that she has never smoked. She has never used smokeless tobacco. She reports that she does not drink alcohol or use drugs.  family history includes Diabetes in her brother, brother, brother, mother, sister, sister, and sister.  Allergies  Allergen Reactions  . Latex Rash       PHYSICAL EXAMINATION: Vital signs: BP (!) 142/74   Pulse 74   Ht 4' 6.5" (1.384 m)   Wt 122 lb (55.3 kg)   BMI 28.88 kg/m   Constitutional: generally well-appearing, no acute distress Psychiatric: alert and oriented x3, cooperative. Anxious Eyes: extraocular movements intact, anicteric, conjunctiva pink Mouth: oral pharynx moist, no lesions Neck: supple no lymphadenopathy Cardiovascular: heart regular rate and rhythm, no murmur Lungs: clear to auscultation bilaterally Abdomen: soft, nontender, nondistended, no obvious ascites, no peritoneal signs, normal bowel sounds, no organomegaly Rectal:omitted Extremities: no clubbing, cyanosis, or lower extremity edema bilaterally Skin: no lesions on visible extremities Neuro: No focal deficits. Cranial nerves intact  ASSESSMENT:  #1. 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 Recent recommendation for completion right hemicolectomy. Patient has been extremely anxious since this recommendation. She is worried about additional cancer and poor outcome #2. Abdominal complaints as manifested by bloating and distention. Almost certainly functional related to her recent anxieties #3. Situational anxiety  PLAN:  #1. Reassurance #2. Provided with probiotic to take 1 daily (align) for 1 month #3. Keep surgical follow-up #4. Advised patient that she could always seek additional opinion tertiary care center if this would help her with decision-making. Her primary care provider could arrange #5. Would plan on routine surveillance  colonoscopy in 1 year given the diagnosis of mucinous neoplasm. Recall made by CMA Leslie  25 minutes spent face-to-face with the patient. Greater than 50% a time use for counseling regarding her abdominal complaints and addressing her generalized worry

## 2017-08-30 NOTE — Patient Instructions (Signed)
Take one Align daily for 4 weeks

## 2017-09-09 ENCOUNTER — Encounter: Payer: Self-pay | Admitting: Surgery

## 2017-09-09 ENCOUNTER — Other Ambulatory Visit
Admission: RE | Admit: 2017-09-09 | Discharge: 2017-09-09 | Disposition: A | Discharge status: Home / Self Care | Payer: No Typology Code available for payment source | Source: Ambulatory Visit | Attending: Surgery | Admitting: Surgery

## 2017-09-09 ENCOUNTER — Ambulatory Visit (INDEPENDENT_AMBULATORY_CARE_PROVIDER_SITE_OTHER): Payer: Self-pay | Admitting: Surgery

## 2017-09-09 VITALS — BP 152/85 | HR 51 | Temp 97.8°F | Ht <= 58 in | Wt 120.8 lb

## 2017-09-09 DIAGNOSIS — M19011 Primary osteoarthritis, right shoulder: Secondary | ICD-10-CM | POA: Insufficient documentation

## 2017-09-09 DIAGNOSIS — K219 Gastro-esophageal reflux disease without esophagitis: Secondary | ICD-10-CM | POA: Insufficient documentation

## 2017-09-09 DIAGNOSIS — F329 Major depressive disorder, single episode, unspecified: Secondary | ICD-10-CM | POA: Insufficient documentation

## 2017-09-09 DIAGNOSIS — K388 Other specified diseases of appendix: Secondary | ICD-10-CM

## 2017-09-09 DIAGNOSIS — E785 Hyperlipidemia, unspecified: Secondary | ICD-10-CM | POA: Insufficient documentation

## 2017-09-09 DIAGNOSIS — M170 Bilateral primary osteoarthritis of knee: Secondary | ICD-10-CM | POA: Insufficient documentation

## 2017-09-09 DIAGNOSIS — M19012 Primary osteoarthritis, left shoulder: Secondary | ICD-10-CM | POA: Insufficient documentation

## 2017-09-09 LAB — COMPREHENSIVE METABOLIC PANEL
ALBUMIN: 4.2 g/dL (ref 3.5–5.0)
ALT: 22 U/L (ref 14–54)
ANION GAP: 7 (ref 5–15)
AST: 25 U/L (ref 15–41)
Alkaline Phosphatase: 76 U/L (ref 38–126)
BILIRUBIN TOTAL: 0.8 mg/dL (ref 0.3–1.2)
BUN: 9 mg/dL (ref 6–20)
CO2: 28 mmol/L (ref 22–32)
Calcium: 9.3 mg/dL (ref 8.9–10.3)
Chloride: 104 mmol/L (ref 101–111)
Creatinine, Ser: 0.63 mg/dL (ref 0.44–1.00)
GFR calc Af Amer: >60 mL/min (ref >60)
GFR calc non Af Amer: >60 mL/min (ref >60)
GLUCOSE: 104 mg/dL — AB (ref 65–99)
POTASSIUM: 3.8 mmol/L (ref 3.5–5.1)
Sodium: 139 mmol/L (ref 135–145)
TOTAL PROTEIN: 7.5 g/dL (ref 6.5–8.1)

## 2017-09-09 LAB — CBC WITH DIFFERENTIAL/PLATELET
BASOS PCT: 1 %
Basophils Absolute: 0 10*3/uL (ref 0–0.1)
EOS PCT: 1 %
Eosinophils Absolute: 0.1 10*3/uL (ref 0–0.7)
HEMATOCRIT: 41.2 % (ref 35.0–47.0)
Hemoglobin: 14.1 g/dL (ref 12.0–16.0)
Lymphocytes Relative: 30 %
Lymphs Abs: 1.9 10*3/uL (ref 1.0–3.6)
MCH: 30.9 pg (ref 26.0–34.0)
MCHC: 34.1 g/dL (ref 32.0–36.0)
MCV: 90.4 fL (ref 80.0–100.0)
MONO ABS: 0.5 10*3/uL (ref 0.2–0.9)
MONOS PCT: 8 %
NEUTROS ABS: 3.8 10*3/uL (ref 1.4–6.5)
Neutrophils Relative %: 60 %
Platelets: 269 10*3/uL (ref 150–440)
RBC: 4.56 MIL/uL (ref 3.80–5.20)
RDW: 14 % (ref 11.5–14.5)
WBC: 6.2 10*3/uL (ref 3.6–11.0)

## 2017-09-09 NOTE — Progress Notes (Signed)
Outpatient Surgical Follow Up  09/09/2017  Tina Johnson is an 52 y.o. female.   Chief Complaint  Patient presents with  . 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 She has been discussed at tumor board and discussed with GI oncology at [place] as well as surgical oncology from both places.  I have personally reviewed the literature regarding the mucinous neoplasms and there is significant controversy regarding the need for completion right hemicolectomy.  There is certainly factors that includes the size and invasion through the muscularis that will definitely increase the risk of recurrence but on the other hand there is not definitive prove that a right hemicolectomy will negate those risks. I have discussed with the patient extensively about the literature and about the different opinions and recommendations. She is pretty anxious and wishes to be as aggressive as possible.  With that being said given the patient concerns and also the characteristics of the size and invasion into the serosa I do think that is very reasonable to offer her a completion right hemicolectomy. She does not have any symptoms of metastatic disease and has some abdominal mild discomfort.      Past Medical History:  Diagnosis Date  . Abnormality of gait 04/26/2014  . Anxiety    controls with exercise   . Arthritis    knees , shoulders  . Bradycardia 05/31/2017  . DEPRESSION 08/14/2007   Qualifier: Diagnosis of  By: Bertram Gala    . Family history of diabetes mellitus (DM) 01/23/2014  . 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 GERD 08/14/2007   Qualifier: Diagnosis of  By: Bertram Gala    . HEADACHE, CHRONIC 08/14/2007   Qualifier: Diagnosis of  By: Bertram Gala    . HYPERLIPIDEMIA 08/14/2007   Qualifier: Diagnosis of  By: Bertram Gala    . Indigestion    no medicine at present  . 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 Neoplasm of appendix   . OSTEOARTHRITIS 08/14/2007   Qualifier: Diagnosis of  By: Bertram Gala    . Other bilateral secondary osteoarthritis of knee 04/03/2015  . PONV (postoperative nausea and vomiting) 1999   partial hysterectomy in [place]  . Right knee DJD 01/23/2014  . 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 ABDOMINAL HYSTERECTOMY    . APPENDECTOMY    . JOINT REPLACEMENT Bilateral 2016   [place]  . LAPAROSCOPIC APPENDECTOMY N/A 05/02/2017   Procedure: LAPAROSCOPIC CECECTOMY;  Surgeon: Jules Husbands, MD;  Location: [facility];  Service: General;  Laterality: N/A;  Please open Lap appy tray.  Have gelport and Right hemicolectomy cart in the room.   . TOTAL ABDOMINAL HYSTERECTOMY W/ BILATERAL SALPINGOOPHORECTOMY  2001   done at [facility]  . TOTAL KNEE ARTHROPLASTY Right 04/03/2015   Procedure: RIGHT TOTAL KNEE ARTHROPLASTY;  Surgeon: Leandrew Koyanagi, MD;  Location: [place];  Service: Orthopedics;  Laterality: Right;  . TOTAL KNEE ARTHROPLASTY Left 04/03/2015   Procedure: LEFT TOTAL KNEE ARTHROPLASTY;  Surgeon: Leandrew Koyanagi, MD;  Location: [place];  Service: Orthopedics;  Laterality: Left;  Marland Kitchen VAGINAL DELIVERY     ?x2  Family History  Problem Relation Age of Onset  . Diabetes Sister   . Diabetes Brother   . Diabetes Mother   . Diabetes Sister   . Diabetes Sister   . Diabetes Brother   . Diabetes Brother   . Colon cancer Neg Hx   . Cancer Neg Hx     Social History:  reports that she has never smoked. She has never used smokeless tobacco. She reports that she does not drink alcohol or use drugs.  Allergies:  Allergies  Allergen Reactions  . Latex Rash    Medications reviewed.    ROS Full ROS performed and is  otherwise negative other than what is stated in HPI   BP (!) 152/85   Pulse (!) 51   Temp 97.8 F (36.6 C) (Oral)   Ht 4\' 5"  (1.346 m)   Wt 54.8 kg (120 lb 12.8 oz)   BMI 30.24 kg/m   Physical Exam  Constitutional: She is oriented to person, place, and time. She appears well-developed and well-nourished. No distress.  HENT:  Head: Normocephalic and atraumatic.  Eyes: Conjunctivae and EOM are normal. No scleral icterus.  Neck: Normal range of motion. Neck supple. No JVD present. No tracheal deviation present. No thyromegaly present.  Cardiovascular: Normal rate and regular rhythm.  Pulmonary/Chest: Effort normal and breath sounds normal. No stridor. No respiratory distress. She has no rales. She exhibits no tenderness.  Abdominal: Soft. Bowel sounds are normal. She exhibits no distension and no mass. There is no tenderness. There is no rebound and no guarding.  Neurological: She is alert and oriented to person, place, and time. She displays normal reflexes. No cranial nerve deficit or sensory deficit. She exhibits normal muscle tone. Coordination normal.  Skin: Skin is warm and dry. Capillary refill takes less than 2 seconds. She is not diaphoretic.  Psychiatric: She has a normal mood and affect. Her behavior is normal. Judgment and thought content normal.  Nursing note and vitals reviewed.      Assessment/Plan:  1. 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  After a lengthy conversation with the patient and succussion at tumor board and multidisciplinary conference we do think that is reasonable to proceed with a right hemicolectomy.  Patient is in full understanding of the procedure.  Risk benefit and possible complications including but not limited to: Bleeding, infection, anastomotic leak, chance of recurrence. I spent more than 40 minutes in this encounter with greater than  50% spent in coordination and counseling of care  Caroleen Hamman, MD [place] Surgeon

## 2017-09-09 NOTE — H&P (View-Only) (Signed)
Outpatient Surgical Follow Up  09/09/2017  Tina Johnson is an 52 y.o. female.   Chief Complaint  Patient presents with  . 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 She has been discussed at tumor board and discussed with GI oncology at [place] as well as surgical oncology from both places.  I have personally reviewed the literature regarding the mucinous neoplasms and there is significant controversy regarding the need for completion right hemicolectomy.  There is certainly factors that includes the size and invasion through the muscularis that will definitely increase the risk of recurrence but on the other hand there is not definitive prove that a right hemicolectomy will negate those risks. I have discussed with the patient extensively about the literature and about the different opinions and recommendations. She is pretty anxious and wishes to be as aggressive as possible.  With that being said given the patient concerns and also the characteristics of the size and invasion into the serosa I do think that is very reasonable to offer her a completion right hemicolectomy. She does not have any symptoms of metastatic disease and has some abdominal mild discomfort.      Past Medical History:  Diagnosis Date  . Abnormality of gait 04/26/2014  . Anxiety    controls with exercise   . Arthritis    knees , shoulders  . Bradycardia 05/31/2017  . DEPRESSION 08/14/2007   Qualifier: Diagnosis of  By: Bertram Gala    . Family history of diabetes mellitus (DM) 01/23/2014  . 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 GERD 08/14/2007   Qualifier: Diagnosis of  By: Bertram Gala    . HEADACHE, CHRONIC 08/14/2007   Qualifier: Diagnosis of  By: Bertram Gala    . HYPERLIPIDEMIA 08/14/2007   Qualifier: Diagnosis of  By: Bertram Gala    . Indigestion    no medicine at present  . 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 Neoplasm of appendix   . OSTEOARTHRITIS 08/14/2007   Qualifier: Diagnosis of  By: Bertram Gala    . Other bilateral secondary osteoarthritis of knee 04/03/2015  . PONV (postoperative nausea and vomiting) 1999   partial hysterectomy in [place]  . Right knee DJD 01/23/2014  . 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 ABDOMINAL HYSTERECTOMY    . APPENDECTOMY    . JOINT REPLACEMENT Bilateral 2016   [place]  . LAPAROSCOPIC APPENDECTOMY N/A 05/02/2017   Procedure: LAPAROSCOPIC CECECTOMY;  Surgeon: Jules Husbands, MD;  Location: [facility];  Service: General;  Laterality: N/A;  Please open Lap appy tray.  Have gelport and Right hemicolectomy cart in the room.   . TOTAL ABDOMINAL HYSTERECTOMY W/ BILATERAL SALPINGOOPHORECTOMY  2001   done at [facility]  . TOTAL KNEE ARTHROPLASTY Right 04/03/2015   Procedure: RIGHT TOTAL KNEE ARTHROPLASTY;  Surgeon: Leandrew Koyanagi, MD;  Location: [place];  Service: Orthopedics;  Laterality: Right;  . TOTAL KNEE ARTHROPLASTY Left 04/03/2015   Procedure: LEFT TOTAL KNEE ARTHROPLASTY;  Surgeon: Leandrew Koyanagi, MD;  Location: [place];  Service: Orthopedics;  Laterality: Left;  Marland Kitchen VAGINAL DELIVERY     ?x2  Family History  Problem Relation Age of Onset  . Diabetes Sister   . Diabetes Brother   . Diabetes Mother   . Diabetes Sister   . Diabetes Sister   . Diabetes Brother   . Diabetes Brother   . Colon cancer Neg Hx   . Cancer Neg Hx     Social History:  reports that she has never smoked. She has never used smokeless tobacco. She reports that she does not drink alcohol or use drugs.  Allergies:  Allergies  Allergen Reactions  . Latex Rash    Medications reviewed.    ROS Full ROS performed and is  otherwise negative other than what is stated in HPI   BP (!) 152/85   Pulse (!) 51   Temp 97.8 F (36.6 C) (Oral)   Ht 4\' 5"  (1.346 m)   Wt 54.8 kg (120 lb 12.8 oz)   BMI 30.24 kg/m   Physical Exam  Constitutional: She is oriented to person, place, and time. She appears well-developed and well-nourished. No distress.  HENT:  Head: Normocephalic and atraumatic.  Eyes: Conjunctivae and EOM are normal. No scleral icterus.  Neck: Normal range of motion. Neck supple. No JVD present. No tracheal deviation present. No thyromegaly present.  Cardiovascular: Normal rate and regular rhythm.  Pulmonary/Chest: Effort normal and breath sounds normal. No stridor. No respiratory distress. She has no rales. She exhibits no tenderness.  Abdominal: Soft. Bowel sounds are normal. She exhibits no distension and no mass. There is no tenderness. There is no rebound and no guarding.  Neurological: She is alert and oriented to person, place, and time. She displays normal reflexes. No cranial nerve deficit or sensory deficit. She exhibits normal muscle tone. Coordination normal.  Skin: Skin is warm and dry. Capillary refill takes less than 2 seconds. She is not diaphoretic.  Psychiatric: She has a normal mood and affect. Her behavior is normal. Judgment and thought content normal.  Nursing note and vitals reviewed.      Assessment/Plan:  1. 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  After a lengthy conversation with the patient and succussion at tumor board and multidisciplinary conference we do think that is reasonable to proceed with a right hemicolectomy.  Patient is in full understanding of the procedure.  Risk benefit and possible complications including but not limited to: Bleeding, infection, anastomotic leak, chance of recurrence. I spent more than 40 minutes in this encounter with greater than  50% spent in coordination and counseling of care  Caroleen Hamman, MD [place] Surgeon

## 2017-09-09 NOTE — Patient Instructions (Addendum)
Please stop by the lab today for blood work.  The lab is located in the medical mall of the hospital.   We will schedule your surgery and call you next week for an exact date.   Please see your blue pre-care sheet for surgery information.

## 2017-09-13 ENCOUNTER — Encounter: Payer: Self-pay | Admitting: Surgery

## 2017-09-13 NOTE — Addendum Note (Signed)
Addended by: Caroleen Hamman F on: 09/13/2017 06:47 PM   Modules accepted: Orders, SmartSet

## 2017-09-16 ENCOUNTER — Telehealth: Payer: Self-pay | Admitting: Surgery

## 2017-09-16 NOTE — Telephone Encounter (Signed)
Pt advised of pre op date/time and sx date. Sx: 09/26/17 with Dr Marlan Palau right hemicolectomy. Pre op: 09/19/17 @ 10:45am--office interview.   Patient made aware to call [phone], between 1-3:00pm the day before surgery, to find out what time to arrive.

## 2017-09-19 ENCOUNTER — Other Ambulatory Visit: Payer: Self-pay

## 2017-09-19 ENCOUNTER — Encounter
Admission: RE | Admit: 2017-09-19 | Discharge: 2017-09-19 | Disposition: A | Discharge status: Home / Self Care | Payer: No Typology Code available for payment source | Source: Ambulatory Visit | Attending: Surgery | Admitting: Surgery

## 2017-09-19 DIAGNOSIS — Z01812 Encounter for preprocedural laboratory examination: Secondary | ICD-10-CM | POA: Insufficient documentation

## 2017-09-19 LAB — TYPE AND SCREEN
ABO/RH(D): O POS
Antibody Screen: NEGATIVE

## 2017-09-19 NOTE — Patient Instructions (Signed)
Your procedure is scheduled on: Mon. 09/26/17 Su procedimiento est programado para: Report to Day Surgery. Presntese a: To find out your arrival time please call [phone] between 1PM - 3PM on Friday 09/23/17. Para saber su hora de llegada por favor llame al [phone] entre la 1PM - 3PM el da:  Remember: Instructions that are not followed completely may result in serious medical risk, up to and including death, or upon the discretion of your surgeon and anesthesiologist your surgery may need to be rescheduled.  Recuerde: Las instrucciones que no se siguen completamente Heritage manager en un riesgo de salud grave, incluyendo hasta la [place] o a discrecin de su cirujano y Environmental health practitioner, su ciruga se puede posponer.   __X_ 1.Do not eat food after midnight the night before your procedure. No gum chewing or hard candies. You may drink clear liquids up to 2 hours   before you are scheduled to arrive for your surgery- DO not drink clear liquids within 2 hours of the start of your surgery.     Clear Liquids include:    water, apple juice without pulp, clear carbohydrate drink such as Clearfast of [place], Black Coffee or Tea (Do not add anything to coffee or tea).      No coma nada despus de la medianoche de la noche anterior a su  procedimiento. No coma chicles ni caramelos duros. Puede tomar   lquidos claros hasta 2 horas antes de su hora programada de llegada al hospital para su procedimiento. No tome lquidos claros durante el transcurso de las 2 horas de su llegada programada al hospital para su procedimiento, ya que esto puede llevar a que su procedimiento se retrase o tenga que volver a Health and safety inspector.  Los lquidos claros incluyen:         - Agua o jugo de [place] sin pulpa         - Bebidas claras con carbohidratos como ClearFast o Gatorade         - Caf negro o t claro (sin leche, sin cremas, no agregue nada al caf ni al  t)  No tome nada que no est en esta lista.  Los  pacientes con diabetes tipo 1 y tipo 2 solo deben Agricultural engineer.  Llame a la clnica de PreCare o a la unidad de Same Day Surgery si tiene alguna pregunta sobre estas instrucciones.              ___ 2.Do Not Smoke or use e-cigarettes For 24 Hours Prior to Your Surgery.  Do not use any chewable tobacco products for at least 6 hours prior to surgery.    No fume ni use cigarrillos electrnicos durante las 24 horas previas a su [place].  No use ningn producto de tabaco masticable durante  al menos 6 horas antes de la ciruga.     ___ 3. No alcohol for 24 hours before or after surgery.    No tome alcohol durante las 24 horas antes ni despus de la [place].   ____4. Bring all medications with you on the day of surgery if instructed.    Lleve todos los medicamentos con usted el da de su ciruga si se le   ha indicado as.   __x__ 5. Notify your doctor if there is any change in your medical condition (cold,  fever, infections).    Informe a su mdico si hay algn cambio en su condicin mdica (resfriado, fiebre, infecciones).   Do not wear jewelry, make-up, hairpins, clips  or nail polish.  No use joyas, maquillajes, pinzas/ganchos para el cabello ni esmalte de uas.  Do not wear lotions, powders, or perfumes. You may wear deodorant.  No use lociones, polvos o perfumes.  Puede usar desodorante.    Do not shave 48 hours prior to surgery. Men may shave face and neck.  No se afeite 48 horas antes de la [place].  Los hombres pueden [employer] cara  y el cuello.   Do not bring valuables to the hospital.   No lleve objetos [facility] is not responsible for any belongings or valuables.  [place] no se hace responsable de ningn tipo de pertenencias uobjetos de Geographical information systems officer.               Contacts, dentures or bridgework may not be worn into surgery.  Los lentes de [place], las dentaduras postizas o puentes no se pueden usar en la [place].  Leave your suitcase in the car. After  surgery it may be brought to your room.  Deje su maleta en el auto.  Despus de la ciruga podr traerla a su habitacin.  For patients admitted to the hospital, discharge time is determined by your treatment team.  Para los pacientes que sean ingresados al hospital, el tiempo en el cual se le dar de alta es determinado por su equipo de [place].   Patients discharged the day of surgery will not be allowed to drive home. A los pacientes que se les da de alta el mismo da de la ciruga no se les permitir conducir a Holiday representative.   Please read over the following fact sheets that you were given: Por favor [place] informacin que le dieron:     __x__ Take these medicines the morning of surgery with A SIP OF WATER:          [employer] estas medicinas la maana de la ciruga con [place]:  1. none  2.   3.   4.       5.  6.  ____ Fleet Enema (as directed)          Enema de Fleet (segn lo indicado)    _x___ Use CHG Soap as directed          Utilice el jabn de CHG segn lo indicado  ____ Use inhalers on the day of surgery          Use los inhaladores el da de la ciruga  ____ Stop metformin 2 days prior to surgery          Deje de tomar el metformin 2 das antes de la ciruga    ____ Take 1/2 of usual insulin dose the night before surgery and none on the morning of surgery           Tome la mitad de la dosis habitual de insulina la noche antes de la [place] y no tome nada en la maana de la             ciruga  ____ Stop Coumadin/Plavix/aspirin on           Deje de tomar el Coumadin/Plavix/aspirina el da:  ____ Stop Anti-inflammatories on           Deje de tomar antiinflamatorios el da:   __x__ Stop supplements until after surgery            Deje de tomar suplementos hasta despus de la ciruga  OMEGA-3  FATTY ACIDS PO, Tea  ____ Bring C-Pap to the hospital          [place] al hospital

## 2017-09-21 ENCOUNTER — Telehealth: Payer: Self-pay

## 2017-09-21 ENCOUNTER — Ambulatory Visit: Payer: Self-pay

## 2017-09-21 MED ORDER — POLYETHYLENE GLYCOL 3350 17 GM/SCOOP PO POWD: 1.0000 | Freq: Once | ORAL | 0 refills | Status: AC

## 2017-09-21 MED ORDER — BISACODYL 5 MG PO TBEC: 5.0000 mg | DELAYED_RELEASE_TABLET | Freq: Once | ORAL | 0 refills | Status: AC

## 2017-09-21 MED ORDER — ERYTHROMYCIN BASE 500 MG PO TABS: 500.0000 mg | ORAL_TABLET | Freq: Three times a day (TID) | ORAL | 0 refills | Status: DC

## 2017-09-21 MED ORDER — NEOMYCIN SULFATE 500 MG PO TABS: 1000.0000 mg | ORAL_TABLET | Freq: Three times a day (TID) | ORAL | 0 refills | Status: DC

## 2017-09-21 NOTE — Telephone Encounter (Signed)
I explained how all of her medications were needed to be taken and patient understood. I told patient to call me back in case she had any questions. Patient agreed.

## 2017-09-21 NOTE — Telephone Encounter (Signed)
Bowel prep medications called to [facility] at this time. Patient notifed by Herb Grays.

## 2017-09-23 ENCOUNTER — Encounter: Payer: Self-pay | Admitting: Anesthesiology

## 2017-09-25 MED ORDER — SODIUM CHLORIDE 0.9 % IV SOLN
1.0000 g | INTRAVENOUS | Status: AC
  Administered 2017-09-26: 1 g via INTRAVENOUS
  Filled 2017-09-25: qty 1

## 2017-09-26 ENCOUNTER — Inpatient Hospital Stay: Payer: Self-pay | Admitting: Anesthesiology

## 2017-09-26 ENCOUNTER — Encounter: Payer: Self-pay | Admitting: *Deleted

## 2017-09-26 ENCOUNTER — Inpatient Hospital Stay
Admission: RE | Admit: 2017-09-26 | Discharge: 2017-09-30 | DRG: 331 | Disposition: A | Discharge status: Home / Self Care | Payer: Self-pay | Source: Ambulatory Visit | Attending: Surgery | Admitting: Surgery

## 2017-09-26 ENCOUNTER — Encounter
Admission: RE | Disposition: A | Discharge status: Home / Self Care | Payer: Self-pay | Source: Ambulatory Visit | Attending: Surgery

## 2017-09-26 ENCOUNTER — Other Ambulatory Visit: Payer: Self-pay

## 2017-09-26 DIAGNOSIS — K219 Gastro-esophageal reflux disease without esophagitis: Secondary | ICD-10-CM | POA: Diagnosis present

## 2017-09-26 DIAGNOSIS — D373 Neoplasm of uncertain behavior of appendix: Secondary | ICD-10-CM

## 2017-09-26 DIAGNOSIS — M19012 Primary osteoarthritis, left shoulder: Secondary | ICD-10-CM | POA: Diagnosis present

## 2017-09-26 DIAGNOSIS — E785 Hyperlipidemia, unspecified: Secondary | ICD-10-CM | POA: Diagnosis present

## 2017-09-26 DIAGNOSIS — D490 Neoplasm of unspecified behavior of digestive system: Principal | ICD-10-CM | POA: Diagnosis present

## 2017-09-26 DIAGNOSIS — K660 Peritoneal adhesions (postprocedural) (postinfection): Secondary | ICD-10-CM | POA: Diagnosis present

## 2017-09-26 DIAGNOSIS — M21169 Varus deformity, not elsewhere classified, unspecified knee: Secondary | ICD-10-CM | POA: Diagnosis present

## 2017-09-26 DIAGNOSIS — Z96653 Presence of artificial knee joint, bilateral: Secondary | ICD-10-CM | POA: Diagnosis present

## 2017-09-26 DIAGNOSIS — Z833 Family history of diabetes mellitus: Secondary | ICD-10-CM

## 2017-09-26 DIAGNOSIS — M19011 Primary osteoarthritis, right shoulder: Secondary | ICD-10-CM | POA: Diagnosis present

## 2017-09-26 DIAGNOSIS — Z90711 Acquired absence of uterus with remaining cervical stump: Secondary | ICD-10-CM

## 2017-09-26 DIAGNOSIS — Z91040 Latex allergy status: Secondary | ICD-10-CM

## 2017-09-26 DIAGNOSIS — Z9049 Acquired absence of other specified parts of digestive tract: Secondary | ICD-10-CM

## 2017-09-26 HISTORY — PX: LAPAROSCOPIC RIGHT HEMI COLECTOMY: SHX5926

## 2017-09-26 LAB — ABO/RH: ABO/RH(D): O POS

## 2017-09-26 LAB — CBC
HCT: 43.2 % (ref 35.0–47.0)
Hemoglobin: 14.6 g/dL (ref 12.0–16.0)
MCH: 30.4 pg (ref 26.0–34.0)
MCHC: 33.8 g/dL (ref 32.0–36.0)
MCV: 89.9 fL (ref 80.0–100.0)
PLATELETS: 275 10*3/uL (ref 150–440)
RBC: 4.81 MIL/uL (ref 3.80–5.20)
RDW: 13.9 % (ref 11.5–14.5)
WBC: 13.5 10*3/uL — ABNORMAL HIGH (ref 3.6–11.0)

## 2017-09-26 LAB — CREATININE, SERUM
Creatinine, Ser: 0.55 mg/dL (ref 0.44–1.00)
GFR calc Af Amer: >60 mL/min (ref >60)
GFR calc non Af Amer: >60 mL/min (ref >60)

## 2017-09-26 SURGERY — LAPAROSCOPIC RIGHT HEMI COLECTOMY
Anesthesia: General | Laterality: Right | Wound class: Clean Contaminated

## 2017-09-26 MED ORDER — ENOXAPARIN SODIUM 40 MG/0.4ML ~~LOC~~ SOLN
40.0000 mg | SUBCUTANEOUS | Status: DC
  Administered 2017-09-27 – 2017-09-30 (×4): 40 mg via SUBCUTANEOUS
  Filled 2017-09-26 (×4): qty 0.4

## 2017-09-26 MED ORDER — ROCURONIUM BROMIDE 50 MG/5ML IV SOLN
INTRAVENOUS | Status: AC
Start: 2017-09-26 — End: ?
  Filled 2017-09-26: qty 1

## 2017-09-26 MED ORDER — MIDAZOLAM HCL 2 MG/2ML IJ SOLN
INTRAMUSCULAR | Status: DC | PRN
  Administered 2017-09-26: 2 mg via INTRAVENOUS

## 2017-09-26 MED ORDER — MIDAZOLAM HCL 2 MG/2ML IJ SOLN
INTRAMUSCULAR | Status: AC
  Filled 2017-09-26: qty 2

## 2017-09-26 MED ORDER — ROCURONIUM BROMIDE 50 MG/5ML IV SOLN
INTRAVENOUS | Status: AC
  Filled 2017-09-26: qty 1

## 2017-09-26 MED ORDER — BUPIVACAINE-EPINEPHRINE (PF) 0.5% -1:200000 IJ SOLN
INTRAMUSCULAR | Status: DC | PRN
  Administered 2017-09-26: 30 mL via PERINEURAL

## 2017-09-26 MED ORDER — HEPARIN SODIUM (PORCINE) 5000 UNIT/ML IJ SOLN
5000.0000 [IU] | Freq: Once | INTRAMUSCULAR | Status: AC
  Administered 2017-09-26: 5000 [IU] via SUBCUTANEOUS

## 2017-09-26 MED ORDER — LIDOCAINE HCL (PF) 2 % IJ SOLN
INTRAMUSCULAR | Status: AC
  Filled 2017-09-26: qty 10

## 2017-09-26 MED ORDER — HEPARIN SODIUM (PORCINE) 5000 UNIT/ML IJ SOLN
INTRAMUSCULAR | Status: AC
  Filled 2017-09-26: qty 1

## 2017-09-26 MED ORDER — CELECOXIB 200 MG PO CAPS
200.0000 mg | ORAL_CAPSULE | ORAL | Status: AC
  Administered 2017-09-26: 200 mg via ORAL

## 2017-09-26 MED ORDER — CHLORHEXIDINE GLUCONATE CLOTH 2 % EX PADS: 6.0000 | MEDICATED_PAD | Freq: Once | CUTANEOUS | Status: DC

## 2017-09-26 MED ORDER — PHENYLEPHRINE HCL 10 MG/ML IJ SOLN
INTRAMUSCULAR | Status: DC | PRN
  Administered 2017-09-26: 100 ug via INTRAVENOUS

## 2017-09-26 MED ORDER — LIDOCAINE HCL (CARDIAC) PF 100 MG/5ML IV SOSY
PREFILLED_SYRINGE | INTRAVENOUS | Status: DC | PRN
  Administered 2017-09-26: 60 mg via INTRAVENOUS

## 2017-09-26 MED ORDER — GABAPENTIN 300 MG PO CAPS
300.0000 mg | ORAL_CAPSULE | Freq: Three times a day (TID) | ORAL | Status: AC
  Administered 2017-09-26: 300 mg via ORAL
  Filled 2017-09-26: qty 1

## 2017-09-26 MED ORDER — BUPIVACAINE LIPOSOME 1.3 % IJ SUSP
INTRAMUSCULAR | Status: AC
  Filled 2017-09-26: qty 20

## 2017-09-26 MED ORDER — SODIUM CHLORIDE 0.9 % IV SOLN
INTRAVENOUS | Status: DC | PRN
  Administered 2017-09-26: 70 mL

## 2017-09-26 MED ORDER — GABAPENTIN 300 MG PO CAPS
ORAL_CAPSULE | ORAL | Status: AC
  Filled 2017-09-26: qty 1

## 2017-09-26 MED ORDER — HYDRALAZINE HCL 20 MG/ML IJ SOLN: 10.0000 mg | INTRAMUSCULAR | Status: DC | PRN

## 2017-09-26 MED ORDER — ARTIFICIAL TEARS OPHTHALMIC OINT
TOPICAL_OINTMENT | OPHTHALMIC | Status: AC
  Filled 2017-09-26: qty 10.5

## 2017-09-26 MED ORDER — FENTANYL CITRATE (PF) 100 MCG/2ML IJ SOLN
INTRAMUSCULAR | Status: AC
  Filled 2017-09-26: qty 2

## 2017-09-26 MED ORDER — ONDANSETRON HCL 4 MG/2ML IJ SOLN
INTRAMUSCULAR | Status: DC | PRN
  Administered 2017-09-26: 4 mg via INTRAVENOUS

## 2017-09-26 MED ORDER — FENTANYL CITRATE (PF) 100 MCG/2ML IJ SOLN
25.0000 ug | INTRAMUSCULAR | Status: DC | PRN
  Administered 2017-09-26 (×2): 25 ug via INTRAVENOUS

## 2017-09-26 MED ORDER — EPHEDRINE SULFATE 50 MG/ML IJ SOLN
INTRAMUSCULAR | Status: DC | PRN
  Administered 2017-09-26: 5 mg via INTRAVENOUS
  Administered 2017-09-26: 10 mg via INTRAVENOUS

## 2017-09-26 MED ORDER — GLYCOPYRROLATE 0.2 MG/ML IJ SOLN
INTRAMUSCULAR | Status: AC
  Filled 2017-09-26: qty 1

## 2017-09-26 MED ORDER — ONDANSETRON HCL 4 MG/2ML IJ SOLN: 4.0000 mg | Freq: Once | INTRAMUSCULAR | Status: DC | PRN

## 2017-09-26 MED ORDER — BUPIVACAINE-EPINEPHRINE (PF) 0.5% -1:200000 IJ SOLN
INTRAMUSCULAR | Status: AC
  Filled 2017-09-26: qty 30

## 2017-09-26 MED ORDER — PANTOPRAZOLE SODIUM 40 MG PO TBEC
40.0000 mg | DELAYED_RELEASE_TABLET | Freq: Every day | ORAL | Status: DC
  Administered 2017-09-26 – 2017-09-30 (×5): 40 mg via ORAL
  Filled 2017-09-26 (×5): qty 1

## 2017-09-26 MED ORDER — GABAPENTIN 300 MG PO CAPS
300.0000 mg | ORAL_CAPSULE | ORAL | Status: AC
  Administered 2017-09-26: 300 mg via ORAL

## 2017-09-26 MED ORDER — PROPOFOL 10 MG/ML IV BOLUS
INTRAVENOUS | Status: AC
Start: 2017-09-26 — End: ?
  Filled 2017-09-26: qty 20

## 2017-09-26 MED ORDER — PROPOFOL 10 MG/ML IV BOLUS
INTRAVENOUS | Status: DC | PRN
  Administered 2017-09-26: 100 mg via INTRAVENOUS

## 2017-09-26 MED ORDER — ROCURONIUM BROMIDE 100 MG/10ML IV SOLN
INTRAVENOUS | Status: DC | PRN
  Administered 2017-09-26: 15 mg via INTRAVENOUS
  Administered 2017-09-26: 35 mg via INTRAVENOUS

## 2017-09-26 MED ORDER — LACTATED RINGERS IV SOLN
INTRAVENOUS | Status: DC
  Administered 2017-09-26 – 2017-09-27 (×2): via INTRAVENOUS

## 2017-09-26 MED ORDER — FAMOTIDINE 20 MG PO TABS
20.0000 mg | ORAL_TABLET | Freq: Once | ORAL | Status: AC
  Administered 2017-09-26: 20 mg via ORAL

## 2017-09-26 MED ORDER — FENTANYL CITRATE (PF) 100 MCG/2ML IJ SOLN
INTRAMUSCULAR | Status: DC | PRN
  Administered 2017-09-26 (×2): 50 ug via INTRAVENOUS
  Administered 2017-09-26: 25 ug via INTRAVENOUS
  Administered 2017-09-26: 50 ug via INTRAVENOUS
  Administered 2017-09-26: 25 ug via INTRAVENOUS

## 2017-09-26 MED ORDER — CHLORHEXIDINE GLUCONATE CLOTH 2 % EX PADS
6.0000 | MEDICATED_PAD | Freq: Once | CUTANEOUS | Status: AC
  Administered 2017-09-26: 2 via TOPICAL

## 2017-09-26 MED ORDER — GLYCOPYRROLATE 0.2 MG/ML IJ SOLN
INTRAMUSCULAR | Status: DC | PRN
  Administered 2017-09-26: 0.1 mg via INTRAVENOUS

## 2017-09-26 MED ORDER — KETOROLAC TROMETHAMINE 30 MG/ML IJ SOLN
30.0000 mg | Freq: Four times a day (QID) | INTRAMUSCULAR | Status: DC
  Administered 2017-09-26 – 2017-09-28 (×9): 30 mg via INTRAVENOUS
  Filled 2017-09-26 (×9): qty 1

## 2017-09-26 MED ORDER — EPHEDRINE SULFATE 50 MG/ML IJ SOLN
INTRAMUSCULAR | Status: AC
  Filled 2017-09-26: qty 1

## 2017-09-26 MED ORDER — PHENYLEPHRINE HCL 10 MG/ML IJ SOLN
INTRAMUSCULAR | Status: AC
  Filled 2017-09-26: qty 1

## 2017-09-26 MED ORDER — CELECOXIB 200 MG PO CAPS
ORAL_CAPSULE | ORAL | Status: AC
  Filled 2017-09-26: qty 1

## 2017-09-26 MED ORDER — ACETAMINOPHEN 500 MG PO TABS
1000.0000 mg | ORAL_TABLET | Freq: Four times a day (QID) | ORAL | Status: DC
  Administered 2017-09-26 – 2017-09-30 (×17): 1000 mg via ORAL
  Filled 2017-09-26 (×17): qty 2

## 2017-09-26 MED ORDER — SODIUM CHLORIDE FLUSH 0.9 % IV SOLN
INTRAVENOUS | Status: AC
  Filled 2017-09-26: qty 40

## 2017-09-26 MED ORDER — ACETAMINOPHEN 500 MG PO TABS
ORAL_TABLET | ORAL | Status: AC
  Filled 2017-09-26: qty 2

## 2017-09-26 MED ORDER — BUPIVACAINE LIPOSOME 1.3 % IJ SUSP
20.0000 mL | Freq: Once | INTRAMUSCULAR | Status: DC
  Filled 2017-09-26: qty 20

## 2017-09-26 MED ORDER — FAMOTIDINE 20 MG PO TABS
ORAL_TABLET | ORAL | Status: AC
  Filled 2017-09-26: qty 1

## 2017-09-26 MED ORDER — SUCCINYLCHOLINE CHLORIDE 20 MG/ML IJ SOLN
INTRAMUSCULAR | Status: AC
  Filled 2017-09-26: qty 1

## 2017-09-26 MED ORDER — SUGAMMADEX SODIUM 200 MG/2ML IV SOLN
INTRAVENOUS | Status: DC | PRN
  Administered 2017-09-26: 125 mg via INTRAVENOUS

## 2017-09-26 MED ORDER — FENTANYL CITRATE (PF) 100 MCG/2ML IJ SOLN
INTRAMUSCULAR | Status: AC
Start: 2017-09-26 — End: 2017-09-26
  Administered 2017-09-26: 25 ug via INTRAVENOUS
  Filled 2017-09-26: qty 2

## 2017-09-26 MED ORDER — ONDANSETRON 4 MG PO TBDP: 4.0000 mg | ORAL_TABLET | Freq: Four times a day (QID) | ORAL | Status: DC | PRN

## 2017-09-26 MED ORDER — OXYCODONE HCL 5 MG PO TABS
5.0000 mg | ORAL_TABLET | ORAL | Status: DC | PRN
  Administered 2017-09-26 – 2017-09-28 (×2): 10 mg via ORAL
  Filled 2017-09-26 (×2): qty 2

## 2017-09-26 MED ORDER — ONDANSETRON HCL 4 MG/2ML IJ SOLN
INTRAMUSCULAR | Status: AC
  Filled 2017-09-26: qty 2

## 2017-09-26 MED ORDER — LACTATED RINGERS IV SOLN
INTRAVENOUS | Status: DC
  Administered 2017-09-26: 07:00:00 via INTRAVENOUS

## 2017-09-26 MED ORDER — ACETAMINOPHEN 500 MG PO TABS
1000.0000 mg | ORAL_TABLET | ORAL | Status: AC
  Administered 2017-09-26: 1000 mg via ORAL

## 2017-09-26 MED ORDER — DEXAMETHASONE SODIUM PHOSPHATE 10 MG/ML IJ SOLN
INTRAMUSCULAR | Status: AC
  Filled 2017-09-26: qty 1

## 2017-09-26 MED ORDER — MORPHINE SULFATE (PF) 2 MG/ML IV SOLN: 2.0000 mg | INTRAVENOUS | Status: DC | PRN

## 2017-09-26 MED ORDER — ONDANSETRON HCL 4 MG/2ML IJ SOLN: 4.0000 mg | Freq: Four times a day (QID) | INTRAMUSCULAR | Status: DC | PRN

## 2017-09-26 MED ORDER — DEXAMETHASONE SODIUM PHOSPHATE 10 MG/ML IJ SOLN
INTRAMUSCULAR | Status: DC | PRN
  Administered 2017-09-26: 5 mg via INTRAVENOUS

## 2017-09-26 SURGICAL SUPPLY — 52 items
ADH SKN CLS APL DERMABOND .7 (GAUZE/BANDAGES/DRESSINGS) ×2
BLADE SURG SZ10 CARB STEEL (BLADE) ×3 IMPLANT
CANISTER SUCT 1200ML W/VALVE (MISCELLANEOUS) ×3 IMPLANT
DECANTER SPIKE VIAL GLASS SM (MISCELLANEOUS) ×3 IMPLANT
DERMABOND ADVANCED (GAUZE/BANDAGES/DRESSINGS) ×4
DERMABOND ADVANCED .7 DNX12 (GAUZE/BANDAGES/DRESSINGS) ×2 IMPLANT
DRAPE INCISE IOBAN 66X45 STRL (DRAPES) ×3 IMPLANT
ELECT CAUTERY BLADE 6.4 (BLADE) ×3 IMPLANT
ELECT REM PT RETURN 9FT ADLT (ELECTROSURGICAL) ×3
ELECTRODE REM PT RTRN 9FT ADLT (ELECTROSURGICAL) ×1 IMPLANT
GELPORT LAPAROSCOPIC (MISCELLANEOUS) ×3 IMPLANT
GLOVE BIO SURGEON STRL SZ7 (GLOVE) ×6 IMPLANT
GOWN STRL REUS W/ TWL LRG LVL3 (GOWN DISPOSABLE) ×4 IMPLANT
GOWN STRL REUS W/TWL LRG LVL3 (GOWN DISPOSABLE) ×12
HANDLE SUCTION POOLE (INSTRUMENTS) ×1 IMPLANT
HANDLE YANKAUER SUCT BULB TIP (MISCELLANEOUS) ×3 IMPLANT
HOLDER FOLEY CATH W/STRAP (MISCELLANEOUS) ×3 IMPLANT
NEEDLE HYPO 22GX1.5 SAFETY (NEEDLE) ×3 IMPLANT
NS IRRIG 1000ML POUR BTL (IV SOLUTION) ×3 IMPLANT
PACK COLON CLEAN CLOSURE (MISCELLANEOUS) ×3 IMPLANT
PACK LAP CHOLECYSTECTOMY (MISCELLANEOUS) ×3 IMPLANT
PENCIL ELECTRO HAND CTR (MISCELLANEOUS) ×3 IMPLANT
RELOAD PROXIMATE 75MM BLUE (ENDOMECHANICALS) ×6 IMPLANT
RELOAD STAPLE 60 2.6 WHT THN (STAPLE) ×2 IMPLANT
RELOAD STAPLE 60 3.6 BLU REG (STAPLE) ×2 IMPLANT
RELOAD STAPLE 75 3.8 BLU REG (ENDOMECHANICALS) IMPLANT
RELOAD STAPLER BLUE 60MM (STAPLE) ×2 IMPLANT
RELOAD STAPLER WHITE 60MM (STAPLE) ×2 IMPLANT
RETRACTOR WOUND ALXS 18CM MED (MISCELLANEOUS) ×1 IMPLANT
RTRCTR WOUND ALEXIS O 18CM MED (MISCELLANEOUS) ×3
SHEARS HARMONIC ACE PLUS 36CM (ENDOMECHANICALS) ×3 IMPLANT
SLEEVE ENDOPATH XCEL 5M (ENDOMECHANICALS) IMPLANT
SPONGE LAP 18X18 RF (DISPOSABLE) ×6 IMPLANT
STAPLE ECHEON FLEX 60 POW ENDO (STAPLE) ×3 IMPLANT
STAPLER PROXIMATE 75MM BLUE (STAPLE) ×2 IMPLANT
STAPLER RELOAD BLUE 60MM (STAPLE) ×6
STAPLER RELOAD WHITE 60MM (STAPLE) ×6
SUCTION POOLE HANDLE (INSTRUMENTS) ×3
SUT MNCRL 4-0 (SUTURE) ×6
SUT MNCRL 4-0 27XMFL (SUTURE) ×2
SUT PDS AB 0 CT1 27 (SUTURE) ×6 IMPLANT
SUT SILK 2 0 (SUTURE) ×3
SUT SILK 2 0 SH CR/8 (SUTURE) ×3 IMPLANT
SUT SILK 2-0 30XBRD TIE 12 (SUTURE) ×1 IMPLANT
SUT VICRYL 0 AB UR-6 (SUTURE) ×6 IMPLANT
SUTURE MNCRL 4-0 27XMF (SUTURE) ×2 IMPLANT
SYR 30ML LL (SYRINGE) ×3 IMPLANT
TRAY FOLEY MTR SLVR 16FR STAT (SET/KITS/TRAYS/PACK) ×3 IMPLANT
TROCAR XCEL 12X100 BLDLESS (ENDOMECHANICALS) ×3 IMPLANT
TROCAR XCEL BLUNT TIP 100MML (ENDOMECHANICALS) ×3 IMPLANT
TROCAR XCEL NON-BLD 5MMX100MML (ENDOMECHANICALS) ×3 IMPLANT
TUBING INSUF HEATED (TUBING) ×6 IMPLANT

## 2017-09-26 NOTE — Op Note (Signed)
PROCEDURES: 1. 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 Genevive Bi  Anesthesia: General endotracheal anesthesia  ASA Class: 2   Surgeon: Caroleen Hamman , MD FACS  Anesthesia: Gen. with endotracheal tu  Findings: No evidence of peritoneal metastasis. No gross evidence of remaining disease  Estimated Blood Loss: 10cc         Drains: none         Specimens: Right colon          Complications: none               Condition: stable  Procedure Details  The patient was seen again in the Holding Room. The benefits, complications, treatment options, and expected outcomes were discussed with the patient. The risks of bleeding, infection, recurrence of symptoms, failure to resolve symptoms,  bowel injury, any of which could require further surgery were reviewed with the patient.   The patient was taken to Operating Room, identified as Tina Johnson, Tina (consulting) and the procedure verified.  A Time Out was held and the above information confirmed.  Prior to the induction of general anesthesia, antibiotic prophylaxis was administered. VTE prophylaxis was in place. General endotracheal anesthesia was then administered and tolerated well. After the induction, the abdomen was prepped with Chloraprep and draped in the sterile fashion. The patient was positioned in the supine position.  7 cm incision was created as a midline mini laparotomy. The abdominal cavity was entered under direct visualization and the GelPort device was placed. A 5 mm port were placed under direct visualization and pneumoperitoneum was obtained. There were dense adhesions from the omentum to the abdominal wall that where lysed in the standard fashion with the Harmonic scalpel. We were able to take down some adhesions from the transverse colon to the greater omentum.  We took down the hepatic flexure  using the harmonic scalpel and incised the The white line of [place] to mobilize the right colon.  We preserved the ureter at all times, were also were able to see the duodenum.  1 we had mobilized the right colon with the deflated pneumoperitoneum and remove the GelPort.  Identified an area of the terminal ileum 10 cm from the ileocecal valve and created a window with the cautery device.  The small bowel was divided with a standard GIA 75 stapler.  Attention was turned to the transverse colon were also a defect was created to the right of the middle colic artery.  Mesenteric defect was created on 25 stapler was fired to divide the transverse colon.  Mesentery of the right colon was scored with the electrocautery and most of it was divided with the harmonic scalpel except the right colic artery that was identified clamped and suture-ligated in the standard fashion.  Specimen was sent for for permanent pathology.  A side-to-side functional end-to-end stapled anastomosis was created with a 75 blue load.  There was no evidence of intraoperative leak and there was good perfusion to the tension-free anastomosis. The enteric defect was closed with a 2-0 Vicryl in a continuous fashion.  All the laparoscopic ports were removed and a second look showed no evidence of any bleeding or any other injuries. We changed gloves and place a new tray to close theabdomen with a 0 PDS suture in a running fashion and the skin was closed with 4-0 Monocryl. Liposomal Marcaine was injected on all incision sites under direct  visualization. Dermabond was used to coat all the skin incisions. Needle and laparotomy count were correct and there were no immediate occasions  Caroleen Hamman, MD, FACS

## 2017-09-26 NOTE — Anesthesia Postprocedure Evaluation (Signed)
Anesthesia Post Note  Patient: Engineer, civil (consulting)  Procedure(s) Performed: LAPAROSCOPIC RIGHT HEMI COLECTOMY (Right )  Patient location during evaluation: PACU Anesthesia Type: General Level of consciousness: awake and alert Pain management: pain level controlled Vital Signs Assessment: post-procedure vital signs reviewed and stable Respiratory status: spontaneous breathing, nonlabored ventilation, respiratory function stable and patient connected to nasal cannula oxygen Cardiovascular status: blood pressure returned to baseline and stable Postop Assessment: no apparent nausea or vomiting Anesthetic complications: no     Last Vitals:  Vitals:   09/26/17 1209 09/26/17 1414  BP: (!) 102/56 (!) 97/50  Pulse: 64 66  Resp: 18 18  Temp: 36.4 C   SpO2: 98% 98%    Last Pain:  Vitals:   09/26/17 1237  TempSrc:   PainSc: Asleep                 Elena Davia S

## 2017-09-26 NOTE — Anesthesia Preprocedure Evaluation (Signed)
Anesthesia Evaluation  Patient identified by MRN, date of birth, ID band Patient awake    Reviewed: Allergy & Precautions, NPO status , Patient's Chart, lab work & pertinent test results, reviewed documented beta blocker date and time   History of Anesthesia Complications (+) PONV and history of anesthetic complications  Airway Mallampati: III  TM Distance: >3 FB     Dental  (+) Chipped   Pulmonary           Cardiovascular      Neuro/Psych  Headaches, PSYCHIATRIC DISORDERS Anxiety Depression    GI/Hepatic GERD  Controlled,  Endo/Other    Renal/GU      Musculoskeletal  (+) Arthritis ,   Abdominal   Peds  Hematology   Anesthesia Other Findings   Reproductive/Obstetrics                             Anesthesia Physical Anesthesia Plan  ASA: III  Anesthesia Plan: General   Post-op Pain Management:    Induction: Intravenous  PONV Risk Score and Plan:   Airway Management Planned: Oral ETT  Additional Equipment:   Intra-op Plan:   Post-operative Plan:   Informed Consent: I have reviewed the patients History and Physical, chart, labs and discussed the procedure including the risks, benefits and alternatives for the proposed anesthesia with the patient or authorized representative who has indicated his/her understanding and acceptance.     Plan Discussed with: CRNA  Anesthesia Plan Comments:         Anesthesia Quick Evaluation

## 2017-09-26 NOTE — Transfer of Care (Signed)
Immediate Anesthesia Transfer of Care Note  Patient: Tina Johnson  Procedure(s) Performed: LAPAROSCOPIC RIGHT HEMI COLECTOMY (Right )  Patient Location: PACU  Anesthesia Type:General  Level of Consciousness: sedated  Airway & Oxygen Therapy: Patient Spontanous Breathing and Patient connected to face mask oxygen  Post-op Assessment: Report given to RN and Post -op Vital signs reviewed and stable  Post vital signs: Reviewed and stable  Last Vitals:  Vitals Value Taken Time  BP 134/68 09/26/2017  9:51 AM  Temp 36.7 C 09/26/2017  9:51 AM  Pulse 69 09/26/2017  9:52 AM  Resp 23 09/26/2017  9:52 AM  SpO2 98 % 09/26/2017  9:52 AM  Vitals shown include unvalidated device data.  Last Pain:  Vitals:   09/26/17 0627  TempSrc: Tympanic  PainSc: 0-No pain         Complications: No apparent anesthesia complications

## 2017-09-26 NOTE — Anesthesia Procedure Notes (Signed)
Procedure Name: Intubation Date/Time: 09/26/2017 7:34 AM Performed by: Hedda Slade, CRNA Pre-anesthesia Checklist: Patient identified, Patient being monitored, Timeout performed, Emergency Drugs available and Suction available Patient Re-evaluated:Patient Re-evaluated prior to induction Oxygen Delivery Method: Circle system utilized Preoxygenation: Pre-oxygenation with 100% oxygen Induction Type: IV induction Ventilation: Mask ventilation without difficulty Laryngoscope Size: Mac and 3 Grade View: Grade I Tube type: Oral Tube size: 7.0 mm Number of attempts: 1 Airway Equipment and Method: Stylet Placement Confirmation: ETT inserted through vocal cords under direct vision,  positive ETCO2 and breath sounds checked- equal and bilateral Secured at: 20 cm Tube secured with: Tape Dental Injury: Teeth and Oropharynx as per pre-operative assessment

## 2017-09-26 NOTE — Anesthesia Post-op Follow-up Note (Signed)
Anesthesia QCDR form completed.        

## 2017-09-26 NOTE — Interval H&P Note (Signed)
History and Physical Interval Note:  09/26/2017 7:14 AM  Tina Johnson  has presented today for surgery, with the diagnosis of MUCOCELE OF APPENDIX  The various methods of treatment have been discussed with the patient and family. After consideration of risks, benefits and other options for treatment, the patient has consented to  Procedure(s): LAPAROSCOPIC RIGHT HEMI COLECTOMY (Right) as a surgical intervention .  The patient's history has been reviewed, patient examined, no change in status, stable for surgery.  I have reviewed the patient's chart and labs.  Questions were answered to the patient's satisfaction.     [place]

## 2017-09-26 NOTE — OR Nursing (Signed)
Patient's preferred pharmacy, [facility] in [place], called to find out what oral pain medication caused shortness of breath.  Left message to return call with name of medication.

## 2017-09-27 ENCOUNTER — Encounter: Payer: Self-pay | Admitting: Surgery

## 2017-09-27 LAB — BASIC METABOLIC PANEL
Anion gap: 9 (ref 5–15)
BUN: 9 mg/dL (ref 6–20)
CALCIUM: 8.5 mg/dL — AB (ref 8.9–10.3)
CO2: 22 mmol/L (ref 22–32)
CREATININE: 0.46 mg/dL (ref 0.44–1.00)
Chloride: 106 mmol/L (ref 101–111)
GFR calc non Af Amer: >60 mL/min (ref >60)
Glucose, Bld: 107 mg/dL — ABNORMAL HIGH (ref 65–99)
Potassium: 3.9 mmol/L (ref 3.5–5.1)
SODIUM: 137 mmol/L (ref 135–145)

## 2017-09-27 LAB — HIV ANTIBODY (ROUTINE TESTING W REFLEX): HIV Screen 4th Generation wRfx: NONREACTIVE

## 2017-09-27 LAB — CBC
HCT: 36 % (ref 35.0–47.0)
Hemoglobin: 12.3 g/dL (ref 12.0–16.0)
MCH: 30.7 pg (ref 26.0–34.0)
MCHC: 34.3 g/dL (ref 32.0–36.0)
MCV: 89.4 fL (ref 80.0–100.0)
PLATELETS: 267 10*3/uL (ref 150–440)
RBC: 4.02 MIL/uL (ref 3.80–5.20)
RDW: 13.6 % (ref 11.5–14.5)
WBC: 11.8 10*3/uL — AB (ref 3.6–11.0)

## 2017-09-27 NOTE — Progress Notes (Signed)
POD # 1 lap Right hemicolectomy Doing very well Some flatus Ambulating AVSS Taking Clears  PE NAD Abd: soft, mild incisional tenderness, no peritonitis, no infection  A/P Doing very well Full liquids DC 24-48hrs Path pending

## 2017-09-28 LAB — CBC WITH DIFFERENTIAL/PLATELET
BASOS ABS: 0 10*3/uL (ref 0–0.1)
BASOS PCT: 1 %
Eosinophils Absolute: 0.2 10*3/uL (ref 0–0.7)
Eosinophils Relative: 2 %
HEMATOCRIT: 34.6 % — AB (ref 35.0–47.0)
Hemoglobin: 11.9 g/dL — ABNORMAL LOW (ref 12.0–16.0)
Lymphocytes Relative: 19 %
Lymphs Abs: 1.6 10*3/uL (ref 1.0–3.6)
MCH: 31 pg (ref 26.0–34.0)
MCHC: 34.5 g/dL (ref 32.0–36.0)
MCV: 90.1 fL (ref 80.0–100.0)
MONO ABS: 0.3 10*3/uL (ref 0.2–0.9)
MONOS PCT: 4 %
NEUTROS ABS: 6.1 10*3/uL (ref 1.4–6.5)
Neutrophils Relative %: 74 %
Platelets: 253 10*3/uL (ref 150–440)
RBC: 3.84 MIL/uL (ref 3.80–5.20)
RDW: 13.9 % (ref 11.5–14.5)
WBC: 8.3 10*3/uL (ref 3.6–11.0)

## 2017-09-28 NOTE — Progress Notes (Signed)
SURGICAL PROGRESS NOTE    Tina Johnson  ZOX:[id number] DOB: 07-10-1965 DOA: 09/26/2017  History: The patient is a 52 year-old female who is POD #2 from elective laparoscopic right hemicolectomy for mucinous neoplasm of the appendix.  Subjective: The patient states that she feels well overall. She has had several dark bloody BMs yesterday and today. She is passing flatus. She denies SOB or dizziness. She denies nausea and vomiting. She is tolerating a regular diet. She denies fever and chills. She denies chest pain and SOB.  Objective: Vitals:   09/27/17 1215 09/27/17 2022 09/28/17 0522 09/28/17 1309  BP: (!) 115/58 (!) 124/58 (!) 125/54 128/68  Pulse: (!) 46 (!) 44 (!) 45 (!) 56  Resp: 17 18 16 16   Temp: 98 F (36.7 C) 98.3 F (36.8 C) 97.8 F (36.6 C) 98 F (36.7 C)  TempSrc: Oral Oral Oral Oral  SpO2: 99% 98% 99% 98%  Weight:      Height:       Examination:  General exam: Appears calm and comfortable  Respiratory system: Respiratory effort normal. Cardiovascular system: Regular rhythm. Bradycardia. No pedal edema. Gastrointestinal system: Abdomen is nondistended, soft and mildly tender at incisions. Normal bowel sounds heard. Incisions are clean, dry, and intact without evidence of infection. Central nervous system: Alert and oriented. No focal neurological deficits. Extremities: No calf swelling or tenderness. Skin: No rashes, lesions or ulcers. Psychiatry: Judgement and insight appear normal. Mood & affect appropriate.    Data Reviewed: I have personally reviewed following labs   CBC: Recent Labs  Lab 09/26/17 1026 09/27/17 0526 09/28/17 1127  WBC 13.5* 11.8* 8.3  NEUTROABS  --   --  6.1  HGB 14.6 12.3 11.9*  HCT 43.2 36.0 34.6*  MCV 89.9 89.4 90.1  PLT 275 267 811   Basic Metabolic Panel: Recent Labs  Lab 09/26/17 1026 09/27/17 0526  NA  --  137  K  --  3.9  CL  --  106  CO2  --  22  GLUCOSE  --  107*  BUN  --  9  CREATININE 0.55 0.46   CALCIUM  --  8.5*    Scheduled Meds: . acetaminophen  1,000 mg Oral Q6H  . enoxaparin (LOVENOX) injection  40 mg Subcutaneous Q24H  . ketorolac  30 mg Intravenous Q6H  . pantoprazole  40 mg Oral Daily   Continuous Infusions: . lactated ringers 40 mL/hr at 09/27/17 1736     LOS: 2 days    Assessment/Plan: The patient is a 52 year-old female with a history of asymptomatic bradycardia who is POD #2 from elective laparoscopic right hemicolectomy for mucinous neoplasm of the appendix.   - Dark bloody stool today and yesterday likely represents evacuation of intraoperative intraluminal bleeding. Hemoglobin today following bloody stool is 11.9 from 12.3 yesterday. Patient is not having symptomatic anemia. Will continue to trend hemoglobin. Will hold Toradol. - Continue regular diet - Continue DVT prophylaxis with Lovenox - Encourage ambulation - Path pending - Likely DC in 24-48 hours   [place], DO

## 2017-09-28 NOTE — Care Management (Signed)
RNCM confirmed that patient is active with [facility] and [facility].  Her next appointment is 11/14/17 at 0830

## 2017-09-29 LAB — SURGICAL PATHOLOGY

## 2017-09-29 LAB — CBC WITH DIFFERENTIAL/PLATELET
Basophils Absolute: 0 10*3/uL (ref 0–0.1)
Basophils Relative: 1 %
EOS PCT: 4 %
Eosinophils Absolute: 0.4 10*3/uL (ref 0–0.7)
HEMATOCRIT: 31.2 % — AB (ref 35.0–47.0)
Hemoglobin: 10.7 g/dL — ABNORMAL LOW (ref 12.0–16.0)
LYMPHS ABS: 2.4 10*3/uL (ref 1.0–3.6)
LYMPHS PCT: 29 %
MCH: 31.3 pg (ref 26.0–34.0)
MCHC: 34.4 g/dL (ref 32.0–36.0)
MCV: 91 fL (ref 80.0–100.0)
MONO ABS: 0.5 10*3/uL (ref 0.2–0.9)
MONOS PCT: 6 %
Neutro Abs: 5.2 10*3/uL (ref 1.4–6.5)
Neutrophils Relative %: 60 %
PLATELETS: 226 10*3/uL (ref 150–440)
RBC: 3.42 MIL/uL — ABNORMAL LOW (ref 3.80–5.20)
RDW: 13.9 % (ref 11.5–14.5)
WBC: 8.5 10*3/uL (ref 3.6–11.0)

## 2017-09-29 NOTE — Progress Notes (Signed)
3 Days Post-Op  Subjective: Patient status post right hemicolectomy.  She is currently on a regular diet on postop day 3.  She has not yet passed gas but has had a bowel movement and that may have had some dark color to it.  Objective: Vital signs in last 24 hours: Temp:  [97.4 F (36.3 C)-98.2 F (36.8 C)] 97.4 F (36.3 C) (06/13 0557) Pulse Rate:  [44-56] 44 (06/13 0557) Resp:  [16-18] 16 (06/13 0557) BP: (99-128)/(58-68) 117/66 (06/13 0557) SpO2:  [98 %-99 %] 99 % (06/13 0557) Last BM Date: 09/28/17  Intake/Output from previous day: 06/12 0701 - 06/13 0700 In: -  Out: 2 [Urine:1; Stool:1] Intake/Output this shift: No intake/output data recorded.  Physical exam:  Vital signs stable and reviewed abdomen is soft distended slightly tympanitic wounds are clean with Dermabond.  No erythema no drainage calves are nontender  Lab Results: CBC  Recent Labs    09/28/17 1127 09/29/17 0356  WBC 8.3 8.5  HGB 11.9* 10.7*  HCT 34.6* 31.2*  PLT 253 226   BMET Recent Labs    09/26/17 1026 09/27/17 0526  NA  --  137  K  --  3.9  CL  --  106  CO2  --  22  GLUCOSE  --  107*  BUN  --  9  CREATININE 0.55 0.46  CALCIUM  --  8.5*   PT/INR No results for input(s): LABPROT, INR in the last 72 hours. ABG No results for input(s): PHART, HCO3 in the last 72 hours.  Invalid input(s): PCO2, PO2  Studies/Results: No results found.  Anti-infectives: Anti-infectives (From admission, onward)   Start     Dose/Rate Route Frequency Ordered Stop   09/26/17 0600  ertapenem (INVANZ) 1 g in sodium chloride 0.9 % 100 mL IVPB     1 g 200 mL/hr over 30 Minutes Intravenous On call to O.R. 09/25/17 2227 09/26/17 0753      Assessment/Plan: s/p Procedure(s): LAPAROSCOPIC RIGHT HEMI COLECTOMY   H&H dropped to 10.7 from 11.9.  Some dark color to her bowel movement last night or yesterday.  She is tolerating a regular diet on postop day 3.  Would continue to observe and recheck her  hemoglobin tomorrow.  Florene Glen, MD, FACS  09/29/2017

## 2017-09-30 LAB — CBC WITH DIFFERENTIAL/PLATELET
BASOS ABS: 0 10*3/uL (ref 0–0.1)
Basophils Relative: 1 %
EOS PCT: 6 %
Eosinophils Absolute: 0.4 10*3/uL (ref 0–0.7)
HCT: 31.5 % — ABNORMAL LOW (ref 35.0–47.0)
Hemoglobin: 10.9 g/dL — ABNORMAL LOW (ref 12.0–16.0)
LYMPHS PCT: 32 %
Lymphs Abs: 2.1 10*3/uL (ref 1.0–3.6)
MCH: 31.6 pg (ref 26.0–34.0)
MCHC: 34.7 g/dL (ref 32.0–36.0)
MCV: 91.1 fL (ref 80.0–100.0)
Monocytes Absolute: 0.4 10*3/uL (ref 0.2–0.9)
Monocytes Relative: 6 %
NEUTROS ABS: 3.6 10*3/uL (ref 1.4–6.5)
Neutrophils Relative %: 55 %
PLATELETS: 223 10*3/uL (ref 150–440)
RBC: 3.46 MIL/uL — AB (ref 3.80–5.20)
RDW: 13.7 % (ref 11.5–14.5)
WBC: 6.5 10*3/uL (ref 3.6–11.0)

## 2017-09-30 MED ORDER — OXYCODONE HCL 5 MG PO TABS: 5.0000 mg | ORAL_TABLET | ORAL | 0 refills | Status: DC | PRN

## 2017-09-30 NOTE — Progress Notes (Signed)
Tina Johnson  A and O x 4. VSS. Pt tolerating diet well. No complaints of pain or nausea. IV removed intact, prescriptions given. Pt voiced understanding of discharge instructions with no further questions. Pt discharged via wheelchair with axillary.    Allergies as of 09/30/2017      Reactions   Norco [hydrocodone-acetaminophen] Shortness Of Breath   Latex Rash      Medication List    TAKE these medications   ALIGN 4 MG Caps Take 4 mg by mouth daily.   amLODipine 5 MG tablet Commonly known as:  NORVASC Take 1 tablet (5 mg total) by mouth daily.   CALCIUM PO Take 1 tablet by mouth 2 (two) times daily.   conjugated estrogens vaginal cream Commonly known as:  PREMARIN Place 1 g vaginally 2 (two) times a week. Monday & Thursday   erythromycin base 500 MG tablet Commonly known as:  E-MYCIN Take 1 tablet (500 mg total) by mouth 3 (three) times daily.   multivitamin with minerals Tabs tablet Take 1 tablet by mouth 2 (two) times daily.   neomycin 500 MG tablet Commonly known as:  MYCIFRADIN Take 2 tablets (1,000 mg total) by mouth 3 (three) times daily.   OMEGA-3 FATTY ACIDS PO Take 1 capsule by mouth 2 (two) times daily.   OVER THE COUNTER MEDICATION   oxyCODONE 5 MG immediate release tablet Commonly known as:  Oxy IR/ROXICODONE Take 1-2 tablets (5-10 mg total) by mouth every 4 (four) hours as needed for severe pain.   pravastatin 20 MG tablet Commonly known as:  PRAVACHOL Take 1 tablet (20 mg total) by mouth daily.   Vitamin D3 2000 units Tabs Take 2,000 Units by mouth every other day.       Vitals:   09/30/17 0544 09/30/17 1440  BP: (!) 127/52 119/76  Pulse: (!) 41 (!) 52  Resp: 16 14  Temp: 98.3 F (36.8 C) 98.5 F (36.9 C)  SpO2: 99% 100%    Francesco Sor

## 2017-09-30 NOTE — Progress Notes (Signed)
Per MD okay for RN to place DC order.

## 2017-10-03 ENCOUNTER — Telehealth: Payer: Self-pay

## 2017-10-03 NOTE — Telephone Encounter (Signed)
Flagged on EMMI report for not reading discharge papers, not knowing who to call about changes in condition, and not having a follow up scheduled.  Called and spoke with patient with assistance of [employer] [phone], Interpreter: Gershon Mussel, [place]: [phone]).  Patient reports she is doing fine and has been taking her pain medication.  Relayed follow up appointments (June 28th at 9:30am with Dr. Dahlia Byes at [facility] in [place] and July 29th at 8:30am with Dr. Wynetta Emery at [facility] in [place]).  Encouraged her to call Dr. Corlis Leak office if she had any changes in her condition.  Patient had a question as to when she could shower with her port.  Encouraged her to call Dr. Corlis Leak office for more instructions.  No other questions or concerns regarding her discharge.  I thanked her for her time and informed her that she would receive one more automated call checking on her in the nest few days.

## 2017-10-14 ENCOUNTER — Encounter: Payer: Self-pay | Admitting: Surgery

## 2017-10-14 ENCOUNTER — Ambulatory Visit (INDEPENDENT_AMBULATORY_CARE_PROVIDER_SITE_OTHER): Payer: Self-pay | Admitting: Surgery

## 2017-10-14 DIAGNOSIS — Z09 Encounter for follow-up examination after completed treatment for conditions other than malignant neoplasm: Secondary | ICD-10-CM

## 2017-10-14 NOTE — Patient Instructions (Signed)

## 2017-10-14 NOTE — Progress Notes (Signed)
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 Oncology recommended Colectomy and  Pt was very anxious about the possibility of recurrence and for her peace of mind we performed a completion right colectomy Path shows no residual tumor D/W pt in detail She is doing great.  Taking PO, Having BM and no pain   PE NAD Abd: soft, incisions c/d/i, no infection  A/P Doing well Oncology f/u RTC prn

## 2017-10-15 NOTE — Discharge Summary (Signed)
Physician Discharge Summary  Patient ID: Tina Johnson MRN: [id number] DOB/AGE: [birth year] 54 y.o.  Admit date: 09/26/2017 Discharge date: 09/30/2017  Admission Diagnoses:  Discharge Diagnoses:  Active Problems:   Low grade mucinous neoplasm of appendix   S/P right colectomy   Discharged Condition: good  Hospital Course: 52 y.o. female s/p cecectomy for mucocele presented to [facility] for completion Right hemicolectomy. Informed consent was obtained and documented, and patient underwent uneventful laparoscope-assisted Right hemicolectomy ([place], 09/26/2017).  Post-operatively, patient's pain and bowel function improved and advancement of patient's diet and ambulation were well-tolerated. Over the first few days of patient's post-surgical course, her hemoglobin trended lower, but then stabilized. The remainder of patient's hospital course was essentially unremarkable, and discharge planning was initiated accordingly with patient safely able to be discharged home with appropriate discharge instructions, pain control, and outpatient surgical follow-up after all of her questions were answered to her expressed satisfaction.  Consults: None  Treatments: surgery: Laparoscope-assisted completion Right hemi-colectomy ([place], 09/26/2017)  Discharge Exam: Blood pressure 119/76, pulse (!) 52, temperature 98.5 F (36.9 C), temperature source Oral, resp. rate 14, height 4\' 6"  (1.372 m), weight 122 lb (55.3 kg), SpO2 100 %. General appearance: alert, cooperative and no distress GI: abdomen soft and non-distended with mild peri-incisional tenderness to palpation, incisions well-approximated without peri-incisional erythema or drainage  Disposition:    Allergies as of 09/30/2017      Reactions   Norco [hydrocodone-acetaminophen] Shortness Of Breath   Latex Rash      Medication List    TAKE these medications   ALIGN 4 MG Caps Take 4 mg by mouth daily.   amLODipine 5 MG tablet Commonly known  as:  NORVASC Take 1 tablet (5 mg total) by mouth daily.   CALCIUM PO Take 1 tablet by mouth 2 (two) times daily.   conjugated estrogens vaginal cream Commonly known as:  PREMARIN Place 1 g vaginally 2 (two) times a week. Monday & Thursday   erythromycin base 500 MG tablet Commonly known as:  E-MYCIN Take 1 tablet (500 mg total) by mouth 3 (three) times daily.   multivitamin with minerals Tabs tablet Take 1 tablet by mouth 2 (two) times daily.   neomycin 500 MG tablet Commonly known as:  MYCIFRADIN Take 2 tablets (1,000 mg total) by mouth 3 (three) times daily.   OMEGA-3 FATTY ACIDS PO Take 1 capsule by mouth 2 (two) times daily.   OVER THE COUNTER MEDICATION   oxyCODONE 5 MG immediate release tablet Commonly known as:  Oxy IR/ROXICODONE Take 1-2 tablets (5-10 mg total) by mouth every 4 (four) hours as needed for severe pain.   pravastatin 20 MG tablet Commonly known as:  PRAVACHOL Take 1 tablet (20 mg total) by mouth daily.   Vitamin D3 2000 units Tabs Take 2,000 Units by mouth every other day.      Follow-up Information    Ladell Pier, MD Follow up on 11/14/2017.   Specialty:  Internal Medicine Why:  Appointment time 8:30 Contact information: [place] [address] [phone]        Jules Husbands, MD. Go on 10/14/2017.   Specialty:  General Surgery Why:  @9 :30a Contact information: [place] [address]  [address] [phone]           Signed: Vickie Epley 10/15/2017, 1:45 PM

## 2017-11-01 MED FILL — ?AMLODIPINE BESYLATE 5 MG T: 5 MG | 30 days supply | Qty: 30 | Fill #3

## 2017-11-14 ENCOUNTER — Encounter: Payer: Self-pay | Admitting: Internal Medicine

## 2017-11-14 ENCOUNTER — Ambulatory Visit: Payer: Self-pay | Attending: Internal Medicine | Admitting: Internal Medicine

## 2017-11-14 VITALS — BP 132/77 | HR 53 | Temp 97.8°F | Resp 16 | Wt 122.4 lb

## 2017-11-14 DIAGNOSIS — K219 Gastro-esophageal reflux disease without esophagitis: Secondary | ICD-10-CM | POA: Insufficient documentation

## 2017-11-14 DIAGNOSIS — E785 Hyperlipidemia, unspecified: Secondary | ICD-10-CM

## 2017-11-14 DIAGNOSIS — D649 Anemia, unspecified: Secondary | ICD-10-CM

## 2017-11-14 DIAGNOSIS — Z833 Family history of diabetes mellitus: Secondary | ICD-10-CM | POA: Insufficient documentation

## 2017-11-14 DIAGNOSIS — I10 Essential (primary) hypertension: Secondary | ICD-10-CM

## 2017-11-14 DIAGNOSIS — Z96651 Presence of right artificial knee joint: Secondary | ICD-10-CM | POA: Insufficient documentation

## 2017-11-14 DIAGNOSIS — R001 Bradycardia, unspecified: Secondary | ICD-10-CM

## 2017-11-14 DIAGNOSIS — Z79899 Other long term (current) drug therapy: Secondary | ICD-10-CM | POA: Insufficient documentation

## 2017-11-14 DIAGNOSIS — Z9049 Acquired absence of other specified parts of digestive tract: Secondary | ICD-10-CM

## 2017-11-14 DIAGNOSIS — F329 Major depressive disorder, single episode, unspecified: Secondary | ICD-10-CM | POA: Insufficient documentation

## 2017-11-14 DIAGNOSIS — M1710 Unilateral primary osteoarthritis, unspecified knee: Secondary | ICD-10-CM | POA: Insufficient documentation

## 2017-11-14 DIAGNOSIS — E559 Vitamin D deficiency, unspecified: Secondary | ICD-10-CM

## 2017-11-14 NOTE — Progress Notes (Signed)
Patient ID: Tina Johnson, female    DOB: 1965/12/10  MRN: [id number]  CC: Follow-up (3 month)   Subjective: Tina Johnson is a 52 y.o. female who presents for chronic ds management. Her concerns today include:  Pt with hx of HL, OA knee, HTN, low-grade appendiceal mucinous neoplasm s/p RT colectomy  Since last visit with me patient was seen by medical oncology who recommended right colectomy based on the size of the appendiceal mucinous neoplasm that was removed earlier this year. Had RT colectomy 09/26/2017. Mild post-op anemia.  No longer on pain med.  Tolerating solids and liquids.  Bradycardia:  Saw cardiology, Dr. Cathie Johnson.  Deemed to have benign sinus brady.  No additional testing recommended.   HTN:  Did not take the Norvasc for 1 mth but just restarted it.  Not exercise since abdominal surgery btu plans to start again.    HL:  Compliant with Pravastatin  Low Ca+ on recent labs.  Report takes Vit D 2000 units QOD  HM:  Due for MMG -was scheduled for Thur of this wk but she called to reschedule  Patient Active Problem List   Diagnosis Date Noted  . S/P right colectomy 09/26/2017  . Low grade mucinous neoplasm of appendix   . Primary malignant neoplasm of appendix ([place]) 08/15/2017  . Bradycardia 05/31/2017  . S/P TKR (total knee replacement), bilateral 10/07/2016  . 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 Family history of diabetes mellitus (DM) 01/23/2014  . Right knee DJD 01/23/2014  . Hyperlipidemia 01/23/2014  . HYPERLIPIDEMIA 08/14/2007  . DEPRESSION 08/14/2007  . GERD 08/14/2007  . HEADACHE, CHRONIC 08/14/2007     Current Outpatient Medications on File Prior to Visit  Medication Sig Dispense Refill  . amLODipine (NORVASC) 5 MG tablet Take 1 tablet (5 mg total) by mouth daily. 90 tablet 3  . CALCIUM PO Take 1 tablet by mouth 2 (two) times daily.     . Cholecalciferol (VITAMIN D3) 2000 units TABS Take  2,000 Units by mouth every other day.     . conjugated estrogens (PREMARIN) vaginal cream Place 1 g vaginally 2 (two) times a week. Monday & Thursday    . EQ GENTLE LAXATIVE 5 MG EC tablet TAKE 1 TABLET BY MOUTH ONCE FOR 1 DOSE  0  . erythromycin base (E-MYCIN) 500 MG tablet Take 1 tablet (500 mg total) by mouth 3 (three) times daily. 6 tablet 0  . Multiple Vitamin (MULTIVITAMIN WITH MINERALS) TABS tablet Take 1 tablet by mouth 2 (two) times daily.    Marland Kitchen neomycin (MYCIFRADIN) 500 MG tablet Take 2 tablets (1,000 mg total) by mouth 3 (three) times daily. 6 tablet 0  . OMEGA-3 FATTY ACIDS PO Take 1 capsule by mouth 2 (two) times daily.     Marland Kitchen OVER THE COUNTER MEDICATION     . polyethylene glycol powder (GLYCOLAX/MIRALAX) powder TAKE 238 GRAMS BY MOUTH ONCE FOR 1 DOSE  0  . pravastatin (PRAVACHOL) 20 MG tablet Take 1 tablet (20 mg total) by mouth daily. 90 tablet 0  . Probiotic Product (ALIGN) 4 MG CAPS Take 4 mg by mouth daily.     No current facility-administered medications on file prior to visit.     Allergies  Allergen Reactions  . Norco [Hydrocodone-Acetaminophen] Shortness Of Breath  . Latex Rash    Social History   Socioeconomic History  . Marital status: Married    Spouse name: Not on file  .  Number of children: Not on file  . Years of education: Not on file  . Highest education level: Not on file  Occupational History  . Not on file  Social Needs  . Financial resource strain: Not on file  . Food insecurity:    Worry: Not on file    Inability: Not on file  . Transportation needs:    Medical: Not on file    Non-medical: Not on file  Tobacco Use  . Smoking status: Never Smoker  . Smokeless tobacco: Never Used  Substance and Sexual Activity  . Alcohol use: No  . Drug use: No  . Sexual activity: Not Currently  Lifestyle  . Physical activity:    Days per week: Not on file    Minutes per session: Not on file  . Stress: Not on file  Relationships  . Social connections:     Talks on phone: Not on file    Gets together: Not on file    Attends religious service: Not on file    Active member of club or organization: Not on file    Attends meetings of clubs or organizations: Not on file    Relationship status: Not on file  . Intimate partner violence:    Fear of current or ex partner: Not on file    Emotionally abused: Not on file    Physically abused: Not on file    Forced sexual activity: Not on file  Other Topics Concern  . Not on file  Social History Narrative  . Not on file    Family History  Problem Relation Age of Onset  . Diabetes Sister   . Diabetes Brother   . Diabetes Mother   . Diabetes Sister   . Diabetes Sister   . Diabetes Brother   . Diabetes Brother   . Colon cancer Neg Hx   . Cancer Neg Hx     Past Surgical History:  Procedure Laterality Date  . ABDOMINAL HYSTERECTOMY    . APPENDECTOMY    . JOINT REPLACEMENT Bilateral 2016   [place]  . LAPAROSCOPIC APPENDECTOMY N/A 05/02/2017   Procedure: LAPAROSCOPIC CECECTOMY;  Surgeon: Jules Husbands, MD;  Location: [facility];  Service: General;  Laterality: N/A;  Please open Lap appy tray.  Have gelport and Right hemicolectomy cart in the room.   Marland Kitchen LAPAROSCOPIC RIGHT HEMI COLECTOMY Right 09/26/2017   Procedure: LAPAROSCOPIC RIGHT HEMI COLECTOMY;  Surgeon: Jules Husbands, MD;  Location: [facility];  Service: General;  Laterality: Right;  . TOTAL ABDOMINAL HYSTERECTOMY W/ BILATERAL SALPINGOOPHORECTOMY  2001   [facility] at [facility]  . TOTAL KNEE ARTHROPLASTY Right 04/03/2015   Procedure: RIGHT TOTAL KNEE ARTHROPLASTY;  Surgeon: Leandrew Koyanagi, MD;  Location: [place];  Service: Orthopedics;  Laterality: Right;  . TOTAL KNEE ARTHROPLASTY Left 04/03/2015   Procedure: LEFT TOTAL KNEE ARTHROPLASTY;  Surgeon: Leandrew Koyanagi, MD;  Location: [place];  Service: Orthopedics;  Laterality: Left;  Marland Kitchen VAGINAL DELIVERY     ?x2    ROS: Review of Systems Nails:  Thinks she has fungus of toe nails.  Told  this by person a nail salon  PHYSICAL EXAM: BP 132/77   Pulse (!) 53   Temp 97.8 F (36.6 C) (Oral)   Resp 16   Wt 122 lb 6.4 oz (55.5 kg)   SpO2 100%   BMI 29.51 kg/m   Wt Readings from Last 3 Encounters:  11/14/17 122 lb 6.4 oz (55.5 kg)  09/26/17 122 lb (55.3 kg)  09/19/17 122 lb 7 oz (55.5 kg)    Physical Exam  General appearance - alert, well appearing, and in no distress Mental status - normal mood, behavior, speech, dress, motor activity, and thought processes Neck - supple, no significant adenopathy Chest - clear to auscultation, no wheezes, rales or rhonchi, symmetric air entry Heart - normal rate, regular rhythm, normal S1, S2, no murmurs, rubs, clicks or gallops Extremities - peripheral pulses normal, no pedal edema, no clubbing or cyanosis Toe Nail:  Nails slightly over grown.  No discoloration  Lab Results  Component Value Date   WBC 6.5 09/30/2017   HGB 10.9 (L) 09/30/2017   HCT 31.5 (L) 09/30/2017   MCV 91.1 09/30/2017   PLT 223 09/30/2017     Chemistry      Component Value Date/Time   NA 137 09/27/2017 0526   NA 143 10/29/2016 1139   K 3.9 09/27/2017 0526   CL 106 09/27/2017 0526   CO2 22 09/27/2017 0526   BUN 9 09/27/2017 0526   BUN 21 10/29/2016 1139   CREATININE 0.46 09/27/2017 0526   CREATININE 0.53 01/21/2016 0921      Component Value Date/Time   CALCIUM 8.5 (L) 09/27/2017 0526   ALKPHOS 76 09/09/2017 1015   AST 25 09/09/2017 1015   ALT 22 09/09/2017 1015   BILITOT 0.8 09/09/2017 1015   BILITOT 0.4 10/29/2016 1139      ASSESSMENT AND PLAN: 1. Essential hypertension At goal.  Continue amlodipine  2. Bradycardia Asymptomatic.  Evaluated by cardiology.  No further work-up needed.  3. Hyperlipidemia, unspecified hyperlipidemia type Tolerating Pravachol.  4. S/P right colectomy 5. Anemia, unspecified type -We will recheck hemoglobin and hematocrit. - Hemoglobin - Hematocrit  6. Vitamin D deficiency Check vitamin D level -  VITAMIN D 25 Hydroxy (Vit-D Deficiency, Fractures)  7.  Toenails do not appear to have fungal infection  HM: Advised patient to keep appointment for mammogram.  Patient was given the opportunity to ask questions.  Patient verbalized understanding of the plan and was able to repeat key elements of the plan.  [employer] interpreter used during this encounter. #[id number]   Orders Placed This Encounter  Procedures  . Hematocrit  . VITAMIN D 25 Hydroxy (Vit-D Deficiency, Fractures)  . Hemoglobin     Requested Prescriptions    No prescriptions requested or ordered in this encounter    Return in about 4 months (around 03/17/2018).  Karle Plumber, MD, FACP

## 2017-11-15 LAB — HEMATOCRIT: Hematocrit: 40.3 % (ref 34.0–46.6)

## 2017-11-15 LAB — VITAMIN D 25 HYDROXY (VIT D DEFICIENCY, FRACTURES): Vit D, 25-Hydroxy: 33.2 ng/mL (ref 30.0–100.0)

## 2017-11-15 LAB — HEMOGLOBIN: Hemoglobin: 13.5 g/dL (ref 11.1–15.9)

## 2017-11-16 ENCOUNTER — Telehealth: Payer: Self-pay

## 2017-11-16 NOTE — Telephone Encounter (Signed)
[place] interpreters [name] [phone]  contacted pt to go over lab results pt didn't answer left a detailed vm informing pt of results and if she has any questions or concerns to give me a call    If pt calls back please give results: vitamin D level is good. She can continue to take the vitamin D supplement every other day. Let her know that she is no longer anemic.

## 2017-12-14 MED FILL — ?AMLODIPINE BESYLATE 5 MG T: 5 MG | 30 days supply | Qty: 30 | Fill #4

## 2017-12-21 NOTE — Telephone Encounter (Signed)
x

## 2018-03-20 ENCOUNTER — Ambulatory Visit: Payer: Self-pay | Admitting: Internal Medicine

## 2018-03-20 ENCOUNTER — Ambulatory Visit: Payer: Self-pay

## 2018-03-21 ENCOUNTER — Encounter: Payer: Self-pay | Admitting: Internal Medicine

## 2018-03-21 ENCOUNTER — Ambulatory Visit: Payer: Self-pay | Attending: Internal Medicine | Admitting: Internal Medicine

## 2018-03-21 VITALS — BP 137/79 | HR 58 | Temp 98.1°F | Resp 16 | Wt 120.6 lb

## 2018-03-21 DIAGNOSIS — Z96653 Presence of artificial knee joint, bilateral: Secondary | ICD-10-CM | POA: Insufficient documentation

## 2018-03-21 DIAGNOSIS — Z91040 Latex allergy status: Secondary | ICD-10-CM | POA: Insufficient documentation

## 2018-03-21 DIAGNOSIS — I10 Essential (primary) hypertension: Secondary | ICD-10-CM

## 2018-03-21 DIAGNOSIS — M1710 Unilateral primary osteoarthritis, unspecified knee: Secondary | ICD-10-CM | POA: Insufficient documentation

## 2018-03-21 DIAGNOSIS — C181 Malignant neoplasm of appendix: Secondary | ICD-10-CM | POA: Insufficient documentation

## 2018-03-21 DIAGNOSIS — E785 Hyperlipidemia, unspecified: Secondary | ICD-10-CM

## 2018-03-21 DIAGNOSIS — Z79899 Other long term (current) drug therapy: Secondary | ICD-10-CM | POA: Insufficient documentation

## 2018-03-21 DIAGNOSIS — Z885 Allergy status to narcotic agent status: Secondary | ICD-10-CM | POA: Insufficient documentation

## 2018-03-21 DIAGNOSIS — Z833 Family history of diabetes mellitus: Secondary | ICD-10-CM | POA: Insufficient documentation

## 2018-03-21 DIAGNOSIS — Z9049 Acquired absence of other specified parts of digestive tract: Secondary | ICD-10-CM | POA: Insufficient documentation

## 2018-03-21 NOTE — Progress Notes (Signed)
Patient ID: Tina Johnson, female    DOB: [birth year]  MRN: [id number]  CC: Hypertension   Subjective: Tina Johnson is a 52 y.o. female who presents for chronic ds management Her concerns today include:  Pt with hx of HL, OA knee, HTN, low-grade appendiceal mucinous neoplasm s/p RT colectomy, benign sinus bradycardia  HTN: Left on Norvasc on last visit.  "I stopped taking the medication once I started to exercise."   Not checking BP, states her device is not very accurate.  Limits salt in foods. Exercising 4 x a wk - 40 mins cardio.  Reports that she is eating  healthy well-balanced meals. No CP/SOB/LE edema/HA/dizziness  HL:  She stopped the Pravachol "after I decided to start exercising."   HM:  Reportedly had MMG in July.  It was nl.  Patient Active Problem List   Diagnosis Date Noted  . S/P right colectomy 09/26/2017  . Low grade mucinous neoplasm of appendix   . Primary malignant neoplasm of appendix ([place]) 08/15/2017  . Bradycardia 05/31/2017  . S/P TKR (total knee replacement), bilateral 10/07/2016  . 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 Family history of diabetes mellitus (DM) 01/23/2014  . Right knee DJD 01/23/2014  . Hyperlipidemia 01/23/2014  . HYPERLIPIDEMIA 08/14/2007  . DEPRESSION 08/14/2007  . GERD 08/14/2007  . HEADACHE, CHRONIC 08/14/2007     Current Outpatient Medications on File Prior to Visit  Medication Sig Dispense Refill  . amLODipine (NORVASC) 5 MG tablet Take 1 tablet (5 mg total) by mouth daily. 90 tablet 3  . CALCIUM PO Take 1 tablet by mouth 2 (two) times daily.     . Cholecalciferol (VITAMIN D3) 2000 units TABS Take 2,000 Units by mouth every other day.     . conjugated estrogens (PREMARIN) vaginal cream Place 1 g vaginally 2 (two) times a week. Monday & Thursday    . EQ GENTLE LAXATIVE 5 MG EC tablet TAKE 1 TABLET BY MOUTH ONCE FOR 1 DOSE  0  . erythromycin base (E-MYCIN) 500 MG  tablet Take 1 tablet (500 mg total) by mouth 3 (three) times daily. 6 tablet 0  . Multiple Vitamin (MULTIVITAMIN WITH MINERALS) TABS tablet Take 1 tablet by mouth 2 (two) times daily.    Marland Kitchen neomycin (MYCIFRADIN) 500 MG tablet Take 2 tablets (1,000 mg total) by mouth 3 (three) times daily. 6 tablet 0  . OMEGA-3 FATTY ACIDS PO Take 1 capsule by mouth 2 (two) times daily.     Marland Kitchen OVER THE COUNTER MEDICATION     . polyethylene glycol powder (GLYCOLAX/MIRALAX) powder TAKE 238 GRAMS BY MOUTH ONCE FOR 1 DOSE  0  . Probiotic Product (ALIGN) 4 MG CAPS Take 4 mg by mouth daily.     No current facility-administered medications on file prior to visit.     Allergies  Allergen Reactions  . Norco [Hydrocodone-Acetaminophen] Shortness Of Breath  . Latex Rash    Social History   Socioeconomic History  . Marital status: Married    Spouse name: Not on file  . Number of children: Not on file  . Years of education: Not on file  . Highest education level: Not on file  Occupational History  . Not on file  Social Needs  . Financial resource strain: Not on file  . Food insecurity:    Worry: Not on file    Inability: Not on file  . Transportation needs:    Medical: Not  on file    Non-medical: Not on file  Tobacco Use  . Smoking status: Never Smoker  . Smokeless tobacco: Never Used  Substance and Sexual Activity  . Alcohol use: No  . Drug use: No  . Sexual activity: Not Currently  Lifestyle  . Physical activity:    Days per week: Not on file    Minutes per session: Not on file  . Stress: Not on file  Relationships  . Social connections:    Talks on phone: Not on file    Gets together: Not on file    Attends religious service: Not on file    Active member of club or organization: Not on file    Attends meetings of clubs or organizations: Not on file    Relationship status: Not on file  . Intimate partner violence:    Fear of current or ex partner: Not on file    Emotionally abused: Not on  file    Physically abused: Not on file    Forced sexual activity: Not on file  Other Topics Concern  . Not on file  Social History Narrative  . Not on file    Family History  Problem Relation Age of Onset  . Diabetes Sister   . Diabetes Brother   . Diabetes Mother   . Diabetes Sister   . Diabetes Sister   . Diabetes Brother   . Diabetes Brother   . Colon cancer Neg Hx   . Cancer Neg Hx     Past Surgical History:  Procedure Laterality Date  . ABDOMINAL HYSTERECTOMY    . APPENDECTOMY    . JOINT REPLACEMENT Bilateral 2016   [place]  . LAPAROSCOPIC APPENDECTOMY N/A 05/02/2017   Procedure: LAPAROSCOPIC CECECTOMY;  Surgeon: Jules Husbands, MD;  Location: [facility];  Service: General;  Laterality: N/A;  Please open Lap appy tray.  Have gelport and Right hemicolectomy cart in the room.   Marland Kitchen LAPAROSCOPIC RIGHT HEMI COLECTOMY Right 09/26/2017   Procedure: LAPAROSCOPIC RIGHT HEMI COLECTOMY;  Surgeon: Jules Husbands, MD;  Location: [facility];  Service: General;  Laterality: Right;  . TOTAL ABDOMINAL HYSTERECTOMY W/ BILATERAL SALPINGOOPHORECTOMY  2001   done at [facility]  . TOTAL KNEE ARTHROPLASTY Right 04/03/2015   Procedure: RIGHT TOTAL KNEE ARTHROPLASTY;  Surgeon: Leandrew Koyanagi, MD;  Location: [place];  Service: Orthopedics;  Laterality: Right;  . TOTAL KNEE ARTHROPLASTY Left 04/03/2015   Procedure: LEFT TOTAL KNEE ARTHROPLASTY;  Surgeon: Leandrew Koyanagi, MD;  Location: [place];  Service: Orthopedics;  Laterality: Left;  Marland Kitchen VAGINAL DELIVERY     ?x2    ROS: Review of Systems Neg except as above. PHYSICAL EXAM: BP 137/79   Pulse (!) 58   Temp 98.1 F (36.7 C) (Oral)   Resp 16   Wt 120 lb 9.6 oz (54.7 kg)   SpO2 100%   BMI 29.08 kg/m   Wt Readings from Last 3 Encounters:  03/21/18 120 lb 9.6 oz (54.7 kg)  11/14/17 122 lb 6.4 oz (55.5 kg)  09/26/17 122 lb (55.3 kg)   BP 140/78 BL UEs  Physical Exam General appearance - alert, well appearing, and in no distress Mental  status - normal mood, behavior, speech, dress, motor activity, and thought processes Neck - supple, no significant adenopathy Chest - clear to auscultation, no wheezes, rales or rhonchi, symmetric air entry Heart - normal rate, regular rhythm, normal S1, S2, no murmurs, rubs, clicks or gallops Extremities -  peripheral pulses normal, no pedal edema, no clubbing or cyanosis   Results for orders placed or performed in visit on 11/14/17  Hematocrit  Result Value Ref Range   Hematocrit 40.3 34.0 - 46.6 %  VITAMIN D 25 Hydroxy (Vit-D Deficiency, Fractures)  Result Value Ref Range   Vit D, 25-Hydroxy 33.2 30.0 - 100.0 ng/mL  Hemoglobin  Result Value Ref Range   Hemoglobin 13.5 11.1 - 15.9 g/dL    ASSESSMENT AND PLAN: 1. Essential hypertension Not at goal of 130/80 or lower.  I recommend taking the Norvasc.  Advise to check BP at least 2 x/wk at gym. Continue healthy eating habits and regular exercise  2. Hyperlipidemia, unspecified hyperlipidemia type Pt will hold off on restaritng Pravachol.  Will check Lipid profile  Patient was given the opportunity to ask questions.  Patient verbalized understanding of the plan and was able to repeat key elements of the plan.  [employer] interpreter used during this encounter. #[id number]   Orders Placed This Encounter  Procedures  . Lipid panel     Requested Prescriptions    No prescriptions requested or ordered in this encounter    Return in about 4 months (around 07/21/2018).  Karle Plumber, MD, FACP

## 2018-03-22 ENCOUNTER — Other Ambulatory Visit: Payer: Self-pay | Admitting: Internal Medicine

## 2018-03-22 LAB — LIPID PANEL
Chol/HDL Ratio: 4 ratio (ref 0.0–4.4)
Cholesterol, Total: 221 mg/dL — ABNORMAL HIGH (ref 100–199)
HDL: 55 mg/dL (ref >39)
LDL Calculated: 131 mg/dL — ABNORMAL HIGH (ref 0–99)
Triglycerides: 173 mg/dL — ABNORMAL HIGH (ref 0–149)
VLDL Cholesterol Cal: 35 mg/dL (ref 5–40)

## 2018-03-22 MED ORDER — PRAVASTATIN SODIUM 20 MG PO TABS: 20.0000 mg | ORAL_TABLET | Freq: Every day | ORAL | 3 refills | Status: DC

## 2018-03-24 ENCOUNTER — Telehealth: Payer: Self-pay | Admitting: Internal Medicine

## 2018-03-24 ENCOUNTER — Telehealth: Payer: Self-pay

## 2018-03-24 NOTE — Telephone Encounter (Signed)
[facility] interpreters [place]  Id# [phone] contacted pt to go over lab results pt is aware and doesn't have any questions or concerns

## 2018-03-24 NOTE — Telephone Encounter (Signed)
Patients husband stopped by per patient to drop off a letter that was received in the mail from [employer] with the results of her recent breast imaging study. Letter will be put in PCP box.

## 2018-03-31 ENCOUNTER — Ambulatory Visit: Payer: Self-pay | Attending: Internal Medicine

## 2018-04-03 ENCOUNTER — Ambulatory Visit: Payer: Self-pay

## 2018-04-21 MED FILL — ?AMLODIPINE BESYLATE 5 MG T: 5 MG | 30 days supply | Qty: 30 | Fill #5

## 2018-04-27 ENCOUNTER — Ambulatory Visit: Payer: Self-pay | Attending: Internal Medicine | Admitting: Internal Medicine

## 2018-04-27 ENCOUNTER — Telehealth: Payer: Self-pay | Admitting: Internal Medicine

## 2018-04-27 ENCOUNTER — Encounter: Payer: Self-pay | Admitting: Internal Medicine

## 2018-04-27 VITALS — BP 146/85 | HR 63 | Temp 98.0°F | Resp 16 | Wt 116.8 lb

## 2018-04-27 DIAGNOSIS — E782 Mixed hyperlipidemia: Secondary | ICD-10-CM

## 2018-04-27 DIAGNOSIS — I10 Essential (primary) hypertension: Secondary | ICD-10-CM

## 2018-04-27 MED FILL — ?PRAVASTATIN SODIUM 20MG TA: 20 | 30 days supply | Qty: 30 | Fill #0

## 2018-04-27 NOTE — Patient Instructions (Signed)
Try taking 1/2 tablet of the Pravachol.

## 2018-04-27 NOTE — Progress Notes (Signed)
Patient ID: Tina Johnson, female    DOB: [birth year]  MRN: [id number]  CC: Hyperlipidemia   Subjective: Tina Johnson is a 53 y.o. female who presents for f/u.  Husband is with her. 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  On last visit, the plan was to f/u in 3-4 mths.   I went over recent blood test that revealed elevated cholesterol.  I recommended restarting Pravachol but she took it for 2 wks then stopped it because it caused back pain.  She is also trying to improve on eating habits.   HTN:  Out of Norvasc for 1 wk.  BP this a.m was 130/85.  She limits salt in the foods.   Patient Active Problem List   Diagnosis Date Noted  . S/P right colectomy 09/26/2017  . Low grade mucinous neoplasm of appendix   . 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 Bradycardia 05/31/2017  . S/P TKR (total knee replacement), bilateral 10/07/2016  . 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 Family history of diabetes mellitus (DM) 01/23/2014  . Right knee DJD 01/23/2014  . Hyperlipidemia 01/23/2014  . HYPERLIPIDEMIA 08/14/2007  . DEPRESSION 08/14/2007  . GERD 08/14/2007  . HEADACHE, CHRONIC 08/14/2007     Current Outpatient Medications on File Prior to Visit  Medication Sig Dispense Refill  . amLODipine (NORVASC) 5 MG tablet Take 1 tablet (5 mg total) by mouth daily. 90 tablet 3  . CALCIUM PO Take 1 tablet by mouth 2 (two) times daily.     . Cholecalciferol (VITAMIN D3) 2000 units TABS Take 2,000 Units by mouth every other day.     . conjugated estrogens (PREMARIN) vaginal cream Place 1 g vaginally 2 (two) times a week. Monday & Thursday    . EQ GENTLE LAXATIVE 5 MG EC tablet TAKE 1 TABLET BY MOUTH ONCE FOR 1 DOSE  0  . erythromycin base (E-MYCIN) 500 MG tablet Take 1  tablet (500 mg total) by mouth 3 (three) times daily. (Patient not taking: Reported on 04/27/2018) 6 tablet 0  . Multiple Vitamin (MULTIVITAMIN WITH MINERALS) TABS tablet Take 1 tablet by mouth 2 (two) times daily.    Marland Kitchen neomycin (MYCIFRADIN) 500 MG tablet Take 2 tablets (1,000 mg total) by mouth 3 (three) times daily. 6 tablet 0  . OMEGA-3 FATTY ACIDS PO Take 1 capsule by mouth 2 (two) times daily.     Marland Kitchen OVER THE COUNTER MEDICATION     . polyethylene glycol powder (GLYCOLAX/MIRALAX) powder TAKE 238 GRAMS BY MOUTH ONCE FOR 1 DOSE  0  . pravastatin (PRAVACHOL) 20 MG tablet Take 1 tablet (20 mg total) by mouth daily. 90 tablet 3  . Probiotic Product (ALIGN) 4 MG CAPS Take 4 mg by mouth daily.     No current facility-administered medications on file prior to visit.     Allergies  Allergen Reactions  . Norco [Hydrocodone-Acetaminophen] Shortness Of Breath  . Latex Rash    Social History   Socioeconomic History  . Marital status: Married    Spouse name: Not on file  . Number of children: Not on file  . Years of education: Not on file  . Highest education level: Not on file  Occupational History  . Not on file  Social Needs  .  Financial resource strain: Not on file  . Food insecurity:    Worry: Not on file    Inability: Not on file  . Transportation needs:    Medical: Not on file    Non-medical: Not on file  Tobacco Use  . Smoking status: Never Smoker  . Smokeless tobacco: Never Used  Substance and Sexual Activity  . Alcohol use: No  . Drug use: No  . Sexual activity: Not Currently  Lifestyle  . Physical activity:    Days per week: Not on file    Minutes per session: Not on file  . Stress: Not on file  Relationships  . Social connections:    Talks on phone: Not on file    Gets together: Not on file    Attends religious service: Not on file    Active member of club or organization: Not on file    Attends meetings of clubs or organizations: Not on file    Relationship  status: Not on file  . Intimate partner violence:    Fear of current or ex partner: Not on file    Emotionally abused: Not on file    Physically abused: Not on file    Forced sexual activity: Not on file  Other Topics Concern  . Not on file  Social History Narrative  . Not on file    Family History  Problem Relation Age of Onset  . Diabetes Sister   . Diabetes Brother   . Diabetes Mother   . Diabetes Sister   . Diabetes Sister   . Diabetes Brother   . Diabetes Brother   . Colon cancer Neg Hx   . Cancer Neg Hx     Past Surgical History:  Procedure Laterality Date  . ABDOMINAL HYSTERECTOMY    . APPENDECTOMY    . JOINT REPLACEMENT Bilateral 2016   [place]  . LAPAROSCOPIC APPENDECTOMY N/A 05/02/2017   Procedure: LAPAROSCOPIC CECECTOMY;  Surgeon: Jules Husbands, MD;  Location: [facility];  Service: General;  Laterality: N/A;  Please open Lap appy tray.  Have gelport and Right hemicolectomy cart in the room.   Marland Kitchen LAPAROSCOPIC RIGHT HEMI COLECTOMY Right 09/26/2017   Procedure: LAPAROSCOPIC RIGHT HEMI COLECTOMY;  Surgeon: Jules Husbands, MD;  Location: [facility];  Service: General;  Laterality: Right;  . TOTAL ABDOMINAL HYSTERECTOMY W/ BILATERAL SALPINGOOPHORECTOMY  2001   done at [facility]  . TOTAL KNEE ARTHROPLASTY Right 04/03/2015   Procedure: RIGHT TOTAL KNEE ARTHROPLASTY;  Surgeon: Leandrew Koyanagi, MD;  Location: [place];  Service: Orthopedics;  Laterality: Right;  . TOTAL KNEE ARTHROPLASTY Left 04/03/2015   Procedure: LEFT TOTAL KNEE ARTHROPLASTY;  Surgeon: Leandrew Koyanagi, MD;  Location: [place];  Service: Orthopedics;  Laterality: Left;  Marland Kitchen VAGINAL DELIVERY     ?x2    ROS: Review of Systems Neg except as above  PHYSICAL EXAM: BP (!) 146/85   Pulse 63   Temp 98 F (36.7 C) (Oral)   Resp 16   Wt 116 lb 12.8 oz (53 kg)   SpO2 99%   BMI 28.16 kg/m   Physical Exam  General appearance - alert, well appearing, and in no distress Mental status - normal mood, behavior,  speech, dress, motor activity, and thought processes  Results for orders placed or performed in visit on 03/21/18  Lipid panel  Result Value Ref Range   Cholesterol, Total 221 (H) 100 - 199 mg/dL   Triglycerides 173 (H) 0 -  149 mg/dL   HDL 55 >39 mg/dL   VLDL Cholesterol Cal 35 5 - 40 mg/dL   LDL Calculated 131 (H) 0 - 99 mg/dL   Chol/HDL Ratio 4.0 0.0 - 4.4 ratio     ASSESSMENT AND PLAN:  1. Essential hypertension Not at goal.  Patient plans to refill amlodipine continue taking as prescribed.  2. Mixed hyperlipidemia I recommend trying to cut Pravachol dose in half which means she will take 10 mg daily to see if she tolerates a lower dose better.    Patient was given the opportunity to ask questions.  Patient verbalized understanding of the plan and was able to repeat key elements of the plan.  [employer] interpreter used during this encounter.  No orders of the defined types were placed in this encounter.    Requested Prescriptions    No prescriptions requested or ordered in this encounter    Return in about 4 months (around 08/26/2018).  Karle Plumber, MD, FACP

## 2018-06-16 ENCOUNTER — Encounter (HOSPITAL_COMMUNITY): Payer: Self-pay

## 2018-06-16 ENCOUNTER — Ambulatory Visit (HOSPITAL_COMMUNITY)
Admission: EM | Admit: 2018-06-16 | Discharge: 2018-06-16 | Disposition: A | Discharge status: Home / Self Care | Payer: Self-pay | Attending: Emergency Medicine | Admitting: Emergency Medicine

## 2018-06-16 DIAGNOSIS — J111 Influenza due to unidentified influenza virus with other respiratory manifestations: Secondary | ICD-10-CM

## 2018-06-16 DIAGNOSIS — R69 Illness, unspecified: Secondary | ICD-10-CM

## 2018-06-16 MED ORDER — OSELTAMIVIR PHOSPHATE 75 MG PO CAPS: 75.0000 mg | ORAL_CAPSULE | Freq: Two times a day (BID) | ORAL | 0 refills | Status: DC

## 2018-06-16 NOTE — Discharge Instructions (Addendum)
Get plenty of rest and push fluids Tamiflu prescribed.  Take as directed and to completion You may use OTC zyrtec and/or flonase as needed for runny nose, and sore throat Use OTC medications like ibuprofen or tylenol as needed fever or pain Follow up with PCP if symptoms persist Return or go to ER if you have any new or worsening symptoms fever, chills, nausea, vomiting, chest pain, cough, shortness of breath, wheezing, abdominal pain, changes in bowel or bladder habits, etc...  Descanse y empuje los lquidos Tamiflu prescrito.  Tome como se indica y a la finalizacin Usted puede Risk manager OTC zyrtec y/o flonase segn sea necesario para moqueo nasal, y Social research officer, government de [place] Use medicamentos de venta libre como ibuprofeno o tylenol segn sea necesario fiebre o dolor Seguimiento con PCP si los sntomas persisten Regrese o vaya a Urgencias si tiene sntomas nuevos o que empeoran fiebre, escalofros, nuseas, vmitos, dolor en el pecho, tos, dificultad para respirar, sibilancias, dolor abdominal, cambios en los hbitos intestinales o vesicales, etc..Marland Kitchen

## 2018-06-16 NOTE — ED Triage Notes (Signed)
Pt presents with flu like symptoms; nausea, general body aches, chills, fatigue, nasal drainage, congestion, and sore throat X 4 days.

## 2018-06-16 NOTE — ED Provider Notes (Signed)
[place]   [id number] 06/16/18 Arrival Time: 1301   CC: URI symptoms   SUBJECTIVE: History from: Interpreter Florentina Jenny #[id number]  Tina Johnson is a 53 y.o. female who presents with abrupt onset of body aches, subjective fever, dry mouth, runny nose, HA, and sore throat x 2-3 days.  Admits to sick exposure to husband with similar symptoms.  Has tried OTC cold medications without relief.  Denies previous symptoms in the past.   Denies sinus pain, SOB, wheezing, chest pain, nausea, changes in bowel or bladder habits.    Received flu shot this year: yes.  ROS: As per HPI.  Past Medical History:  Diagnosis Date  . Abnormality of gait 04/26/2014  . Anxiety    controls with exercise   . Arthritis    knees , shoulders  . Bradycardia 05/31/2017  . DEPRESSION 08/14/2007   Qualifier: Diagnosis of  By: Bertram Gala    . Family history of diabetes mellitus (DM) 01/23/2014  . 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 GERD 08/14/2007   Qualifier: Diagnosis of  By: Bertram Gala    . HEADACHE, CHRONIC 08/14/2007   Qualifier: Diagnosis of  By: Bertram Gala    . HYPERLIPIDEMIA 08/14/2007   Qualifier: Diagnosis of  By: Bertram Gala    . Indigestion    no medicine at present  . 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 Neoplasm of appendix   . OSTEOARTHRITIS 08/14/2007   Qualifier: Diagnosis of  By: Bertram Gala    . Other bilateral secondary osteoarthritis of knee 04/03/2015  . PONV (postoperative nausea and vomiting) 1999   partial hysterectomy in [place]  . Right knee DJD 01/23/2014  . S/P TKR (total knee replacement), bilateral 10/07/2016   Past Surgical History:  Procedure Laterality Date  . ABDOMINAL HYSTERECTOMY    . APPENDECTOMY    . JOINT REPLACEMENT Bilateral 2016   [place]  . LAPAROSCOPIC APPENDECTOMY N/A 05/02/2017   Procedure: LAPAROSCOPIC CECECTOMY;  Surgeon: Jules Husbands, MD;  Location: [facility];   Service: General;  Laterality: N/A;  Please open Lap appy tray.  Have gelport and Right hemicolectomy cart in the room.   Marland Kitchen LAPAROSCOPIC RIGHT HEMI COLECTOMY Right 09/26/2017   Procedure: LAPAROSCOPIC RIGHT HEMI COLECTOMY;  Surgeon: Jules Husbands, MD;  Location: [facility];  Service: General;  Laterality: Right;  . TOTAL ABDOMINAL HYSTERECTOMY W/ BILATERAL SALPINGOOPHORECTOMY  2001   done at [facility]  . TOTAL KNEE ARTHROPLASTY Right 04/03/2015   Procedure: RIGHT TOTAL KNEE ARTHROPLASTY;  Surgeon: Leandrew Koyanagi, MD;  Location: [place];  Service: Orthopedics;  Laterality: Right;  . TOTAL KNEE ARTHROPLASTY Left 04/03/2015   Procedure: LEFT TOTAL KNEE ARTHROPLASTY;  Surgeon: Leandrew Koyanagi, MD;  Location: [place];  Service: Orthopedics;  Laterality: Left;  Marland Kitchen VAGINAL DELIVERY     ?x2   Allergies  Allergen Reactions  . Norco [Hydrocodone-Acetaminophen] Shortness Of Breath  . Latex Rash   No current facility-administered medications on file prior to encounter.    Current Outpatient Medications on File Prior to Encounter  Medication Sig Dispense Refill  . amLODipine (NORVASC) 5 MG tablet Take 1 tablet (5 mg total) by mouth daily. 90 tablet 3  . CALCIUM PO Take 1 tablet by mouth 2 (two) times daily.     . Cholecalciferol (VITAMIN D3) 2000 units TABS Take 2,000 Units by mouth every other day.     . conjugated  estrogens (PREMARIN) vaginal cream Place 1 g vaginally 2 (two) times a week. Monday & Thursday    . EQ GENTLE LAXATIVE 5 MG EC tablet TAKE 1 TABLET BY MOUTH ONCE FOR 1 DOSE  0  . erythromycin base (E-MYCIN) 500 MG tablet Take 1 tablet (500 mg total) by mouth 3 (three) times daily. (Patient not taking: Reported on 04/27/2018) 6 tablet 0  . Multiple Vitamin (MULTIVITAMIN WITH MINERALS) TABS tablet Take 1 tablet by mouth 2 (two) times daily.    Marland Kitchen neomycin (MYCIFRADIN) 500 MG tablet Take 2 tablets (1,000 mg total) by mouth 3 (three) times daily. 6 tablet 0  . OMEGA-3 FATTY ACIDS PO Take 1 capsule by  mouth 2 (two) times daily.     Marland Kitchen OVER THE COUNTER MEDICATION     . polyethylene glycol powder (GLYCOLAX/MIRALAX) powder TAKE 238 GRAMS BY MOUTH ONCE FOR 1 DOSE  0  . pravastatin (PRAVACHOL) 20 MG tablet Take 1 tablet (20 mg total) by mouth daily. 90 tablet 3  . Probiotic Product (ALIGN) 4 MG CAPS Take 4 mg by mouth daily.     Social History   Socioeconomic History  . Marital status: Married    Spouse name: Not on file  . Number of children: Not on file  . Years of education: Not on file  . Highest education level: Not on file  Occupational History  . Not on file  Social Needs  . Financial resource strain: Not on file  . Food insecurity:    Worry: Not on file    Inability: Not on file  . Transportation needs:    Medical: Not on file    Non-medical: Not on file  Tobacco Use  . Smoking status: Never Smoker  . Smokeless tobacco: Never Used  Substance and Sexual Activity  . Alcohol use: No  . Drug use: No  . Sexual activity: Not Currently  Lifestyle  . Physical activity:    Days per week: Not on file    Minutes per session: Not on file  . Stress: Not on file  Relationships  . Social connections:    Talks on phone: Not on file    Gets together: Not on file    Attends religious service: Not on file    Active member of club or organization: Not on file    Attends meetings of clubs or organizations: Not on file    Relationship status: Not on file  . Intimate partner violence:    Fear of current or ex partner: Not on file    Emotionally abused: Not on file    Physically abused: Not on file    Forced sexual activity: Not on file  Other Topics Concern  . Not on file  Social History Narrative  . Not on file   Family History  Problem Relation Age of Onset  . Diabetes Sister   . Diabetes Brother   . Diabetes Mother   . Diabetes Sister   . Diabetes Sister   . Diabetes Brother   . Diabetes Brother   . Colon cancer Neg Hx   . Cancer Neg Hx     OBJECTIVE:  Vitals:    06/16/18 1334  BP: 130/72  Pulse: (!) 59  Resp: 18  Temp: 98.1 F (36.7 C)  TempSrc: Oral  SpO2: 100%     General appearance: alert; appears fatigued, but nontoxic; speaking in full sentences and tolerating own secretions HEENT: NCAT; Ears: EACs clear, TMs pearly gray; Eyes: PERRL.  EOM grossly intact. Nose: nares patent without rhinorrhea, Throat: oropharynx clear, tonsils non erythematous or enlarged, uvula midline  Neck: supple without LAD Lungs: unlabored respirations, symmetrical air entry; cough: absent; no respiratory distress; CTAB Heart: regular rate and rhythm.  Radial pulses 2+ symmetrical bilaterally Abdomen: soft, non-distended, normal active bowel sounds, NTTP, no guarding Skin: warm and dry Psychological: alert and cooperative; normal mood and affect  ASSESSMENT & PLAN:  1. Influenza-like illness     Meds ordered this encounter  Medications  . oseltamivir (TAMIFLU) 75 MG capsule    Sig: Take 1 capsule (75 mg total) by mouth every 12 (twelve) hours.    Dispense:  10 capsule    Refill:  0    Order Specific Question:   Supervising Provider    Answer:   Raylene Everts [[id number]]    Get plenty of rest and push fluids Tamiflu prescribed.  Take as directed and to completion You may use OTC zyrtec and/or flonase as needed for runny nose, and sore throat Use OTC medications like ibuprofen or tylenol as needed fever or pain Follow up with PCP if symptoms persist Return or go to ER if you have any new or worsening symptoms fever, chills, nausea, vomiting, chest pain, cough, shortness of breath, wheezing, abdominal pain, changes in bowel or bladder habits, etc...  Reviewed expectations re: course of current medical issues. Questions answered. Outlined signs and symptoms indicating need for more acute intervention. Patient verbalized understanding. After Visit Summary given.         Lestine Box, PA-C 06/16/18 1424

## 2018-06-30 ENCOUNTER — Other Ambulatory Visit: Payer: Self-pay | Admitting: Internal Medicine

## 2018-06-30 MED FILL — AMLODIPINE BESYLATE 5 MG TA: 5 | 90 days supply | Qty: 90 | Fill #0

## 2018-06-30 NOTE — Telephone Encounter (Signed)
Pt came in stated she had no more amLODipine (NORVASC) 5 MG tablet  Would like enough supply to last until next appt to [facility] rx

## 2018-07-05 MED FILL — ?PRAVASTATIN SODIUM 20MG TA: 20 | 30 days supply | Qty: 30 | Fill #1

## 2018-07-09 IMAGING — CT CT HEAD W/O CM
4 series · 17 of 47 positions shown, 19 images · non-contrast
Comparison: 09/23/2006 CT head

CLINICAL DATA: 50 y/o  F; 4 months of dizziness.

EXAM:
CT HEAD WITHOUT CONTRAST
TECHNIQUE: Contiguous axial images were obtained from the base of the skull
through the vertex without intravenous contrast.

[Series 3: head without · axial · non-contrast · 0.39mm/px · z∈[-140,-30]mm · 7 of 30 slices shown, 9 images]
[im 4/30  brain]
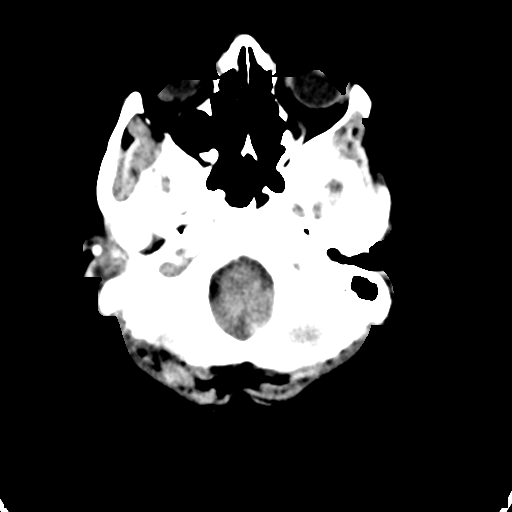
[im 4/30  bone]
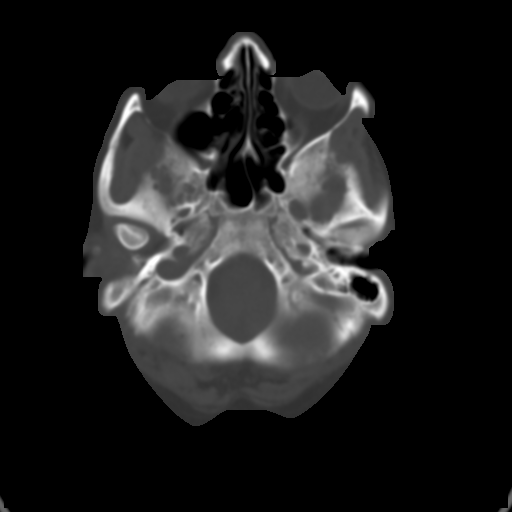
[im 8/30  brain]
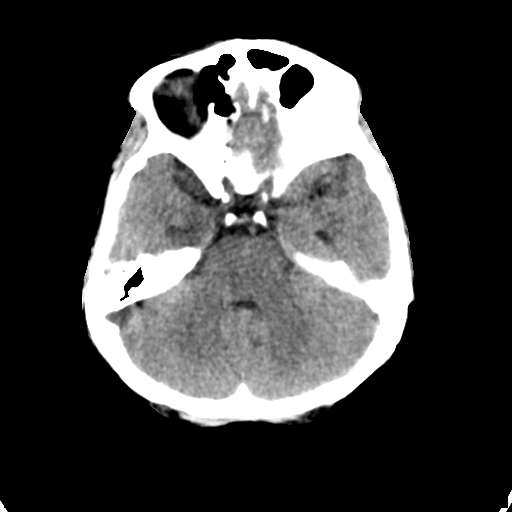
[im 11/30  brain]
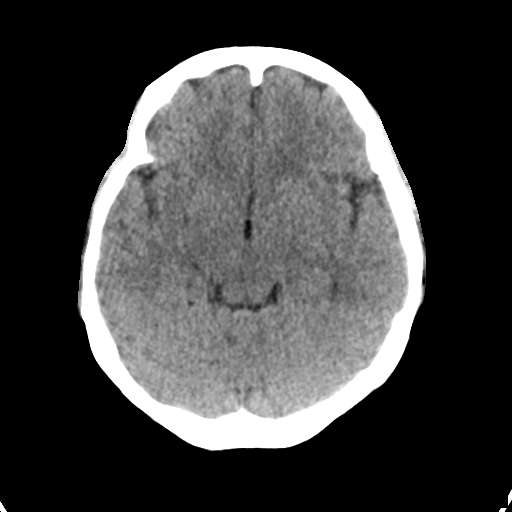
[im 15/30  brain]
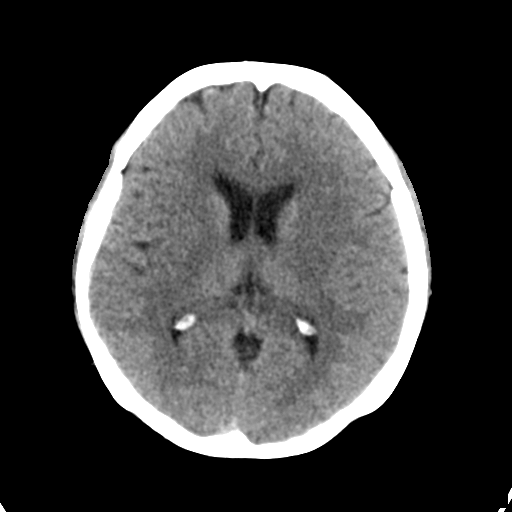
[im 19/30  brain]
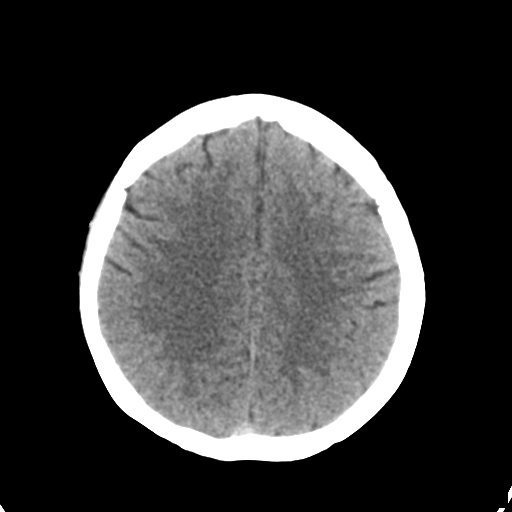
[im 19/30  bone]
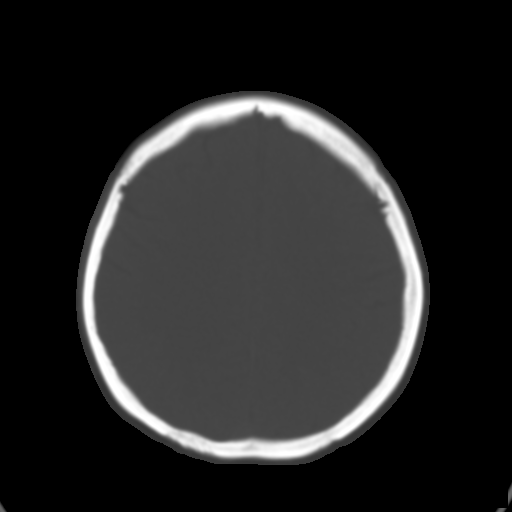
[im 22/30  brain]
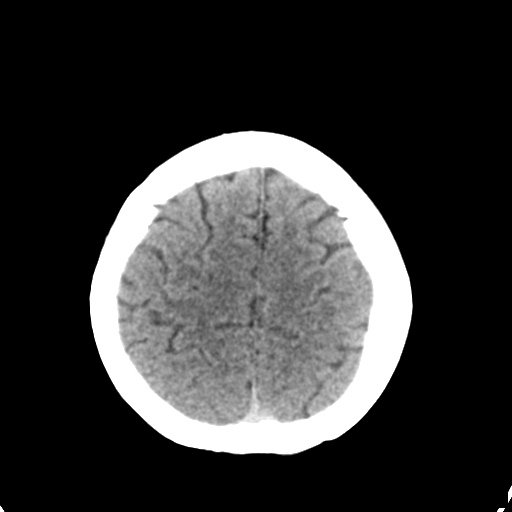
[im 26/30  brain]
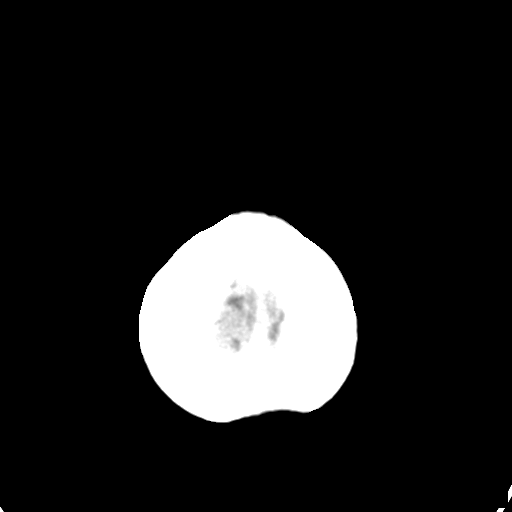

[Series 4: head bone · axial · 0.39mm/px · z∈[-141,-91]mm · 4 of 74 slices shown]
[im 8/74  bone]
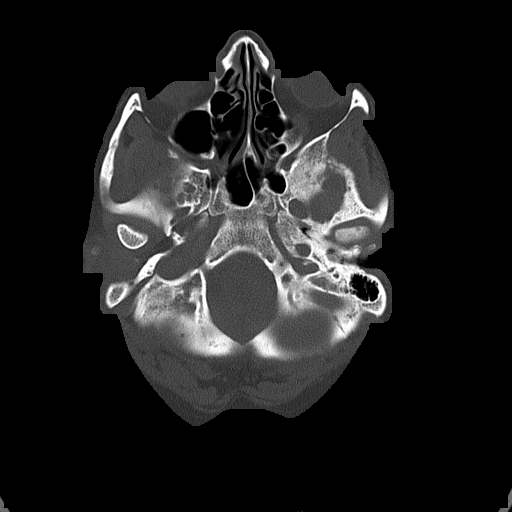
[im 15/74  bone]
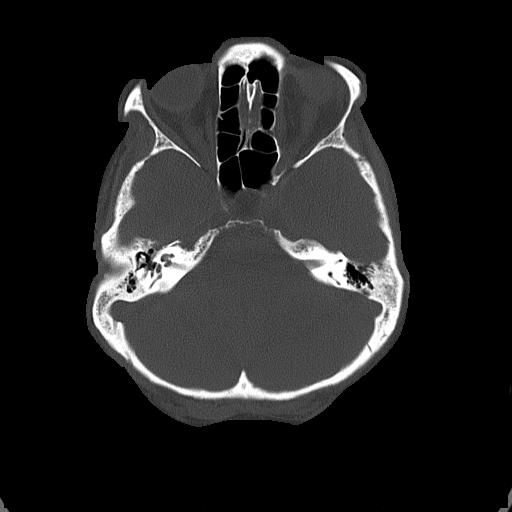
[im 22/74  bone]
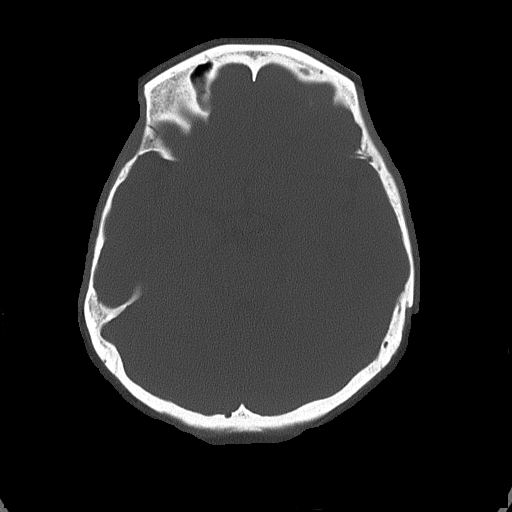
[im 33/74  bone]
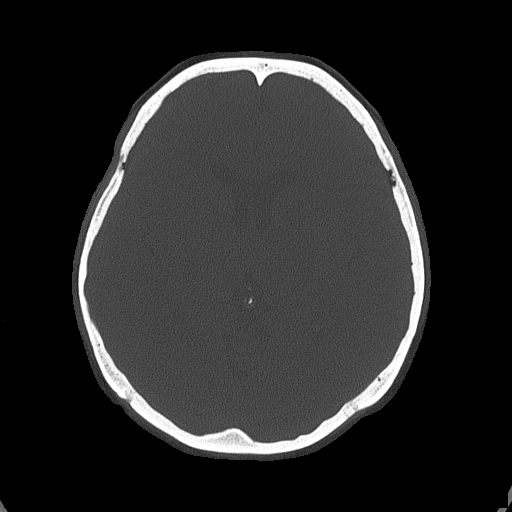

[Series 5: head without cor · coronal · non-contrast · 0.29mm/px · 3 of 64 slices shown]
[im 22/64  brain]
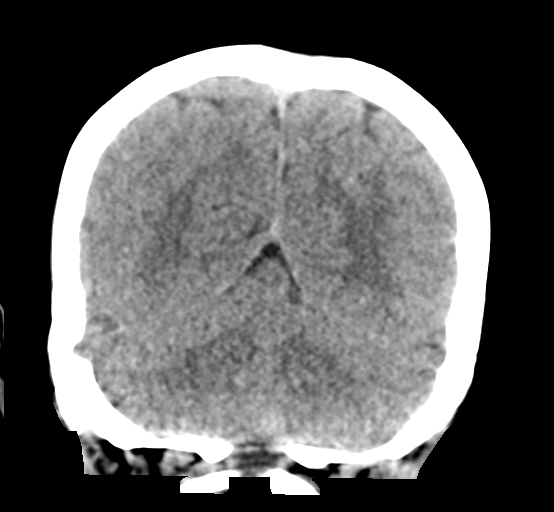
[im 29/64  brain]
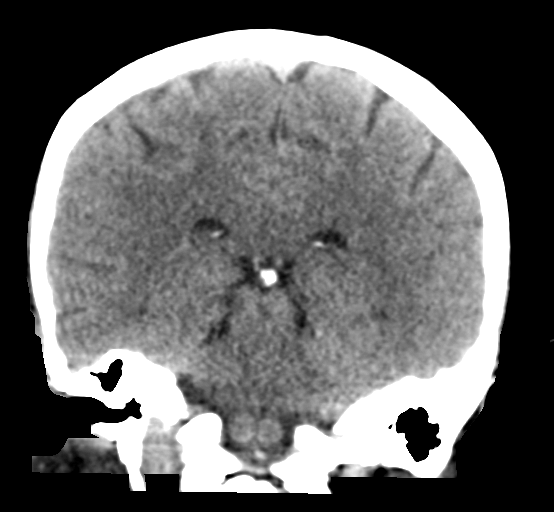
[im 36/64  brain]
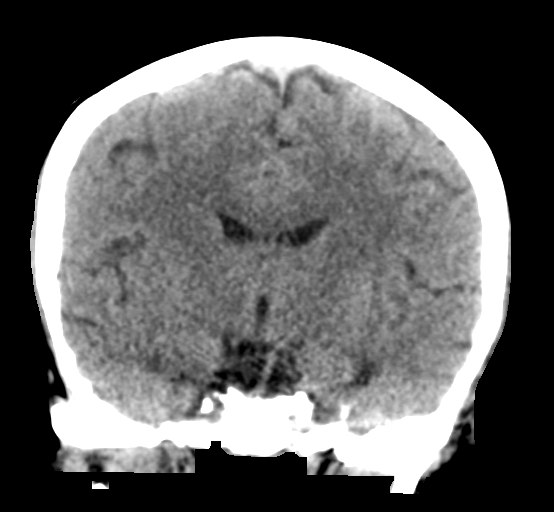

[Series 6: head without sag · sagittal · non-contrast · 0.29mm/px · 3 of 52 slices shown]
[im 18/52  brain]
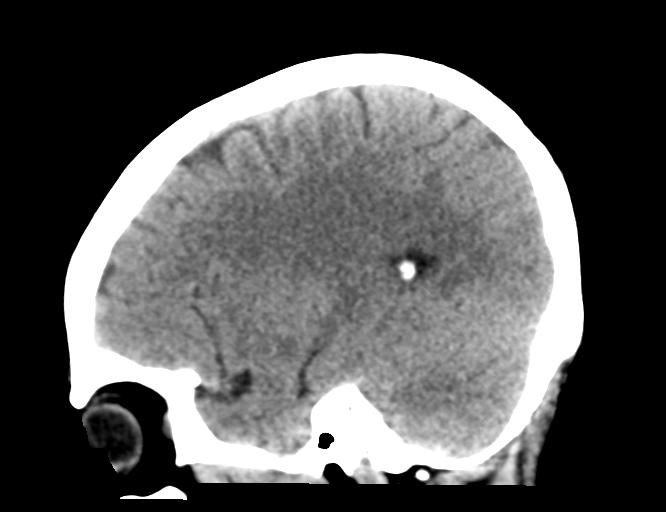
[im 26/52  brain]
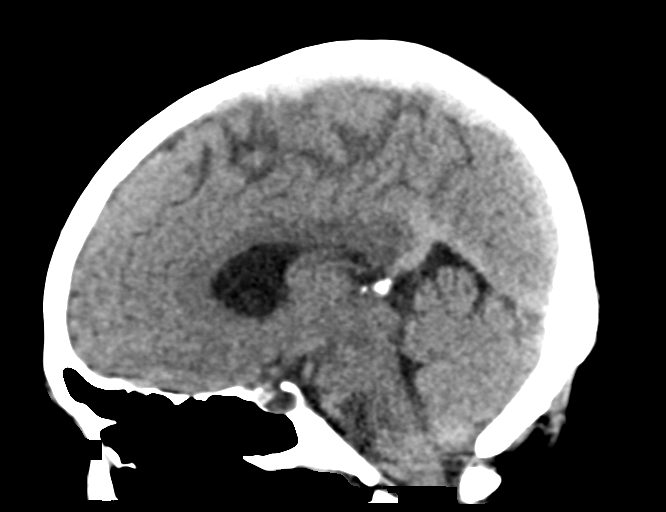
[im 35/52  brain]
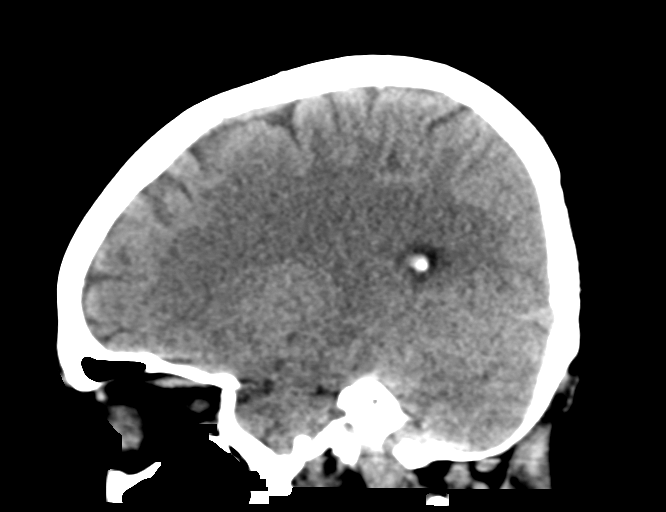

[17 of 47 positions shown; findings below may reference images not displayed]

FINDINGS: Brain: No evidence of acute infarction, hemorrhage, hydrocephalus,
extra-axial collection or mass lesion/mass effect. Stable incidental
partially empty sella turcica.

Vascular: No hyperdense vessel or unexpected calcification.

Skull: Normal. Negative for fracture or focal lesion.

Sinuses/Orbits: No acute finding.

Other: None.
IMPRESSION: No acute intracranial abnormality. Stable unremarkable CT of the
head.

By: Ruta Perez M.D.

## 2018-08-15 ENCOUNTER — Other Ambulatory Visit: Payer: Self-pay

## 2018-08-16 ENCOUNTER — Inpatient Hospital Stay: Payer: Self-pay | Attending: Internal Medicine | Admitting: Internal Medicine

## 2018-08-16 ENCOUNTER — Other Ambulatory Visit: Payer: Self-pay

## 2018-08-16 DIAGNOSIS — C181 Malignant neoplasm of appendix: Secondary | ICD-10-CM

## 2018-08-16 NOTE — Progress Notes (Signed)
telehealth visit today interpreted by [place] interpreter

## 2018-08-16 NOTE — Assessment & Plan Note (Addendum)
#   Low-grade mucinous appendiceal neoplasm-status post completion right hemicolectomy-no evidence of residual neoplasm noted.  I reviewed the pathology in detail.  # Patient will likely be cured. I would not recommend any further surveillance with regards to her appendiceal neoplasm.   # defer to Dr. Kathrine Cords re: surveillaince colonoscopies for history of polyps.  Phone number given to the patient.  # DISPOSITION: NO follow up needed.

## 2018-08-16 NOTE — Progress Notes (Signed)
I connected with Tina Johnson on 08/16/18 at 10:30 AM EDT by video enabled telemedicine visit and verified that I am speaking with the correct person using two identifiers.  I discussed the limitations, risks, security and privacy concerns of performing an evaluation and management service by telemedicine and the availability of in-person appointments. I also discussed with the patient that there may be a patient responsible charge related to this service. The patient expressed understanding and agreed to proceed.    Other persons participating in the visit and their role in the encounter: Interpreter Patient's location: Home Provider's location: Office  Oncology History   # April 2019- Low Grade appendiceal mucinous neoplasm [s/p lap appen & Cecectomy; Dr.Pabon]; G-1; T=2.9cm;  pT3p [invades through the muscularis propria into the subserosa or mesoappendix]Nx;Neg-Margins; No LVI/PNI; stage I. ------------------------------ ------  DIAGNOSIS:  A. COLON, RIGHT; RIGHT COLECTOMY:  - NEGATIVE FOR NEOPLASM AND MALIGNANCY.  - CECUM WITH INTACT STAPLE LINE AND SEROSAL CHANGES CONSISTENT WITH  PRIOR SURGICAL PROCEDURE.  - COLON WITH REACTIVE LYMPHOID HYPERPLASIA.  - TWENTY LYMPH NODES NEGATIVE FOR MALIGNANCY (0/20).  - UNREMARKABLE PROXIMAL SMALL INTESTINAL AND DISTAL COLONIC MARGINS  # [place] dec 2018   # TAH& BSO [2001] ? etopic/infection     Primary malignant neoplasm of appendix [facility])     Chief Complaint: Low-grade appendiceal neoplasm   History of present illness:Tina Johnson 53 y.o.  female with history of low-grade appendiceal neoplasm.   Patient had right hemicolectomy.  Patient history of intermittent pain at the site of surgery.  It resolved after 4 months.  She denies any blood in stools or black or stools.  Denies any nausea vomiting.  No chest pain or shortness of breath cough.  Observation/objective:  Assessment and plan: Primary malignant neoplasm of  appendix ([place]) # Low-grade mucinous appendiceal neoplasm-status post completion right hemicolectomy-no evidence of residual neoplasm noted.  I reviewed the pathology in detail.  # Patient will likely be cured. I would not recommend any further surveillance with regards to her appendiceal neoplasm.   # defer to Dr. Kathrine Cords re: surveillaince colonoscopies for history of polyps.  Phone number given to the patient.  # DISPOSITION: NO follow up needed.       Follow-up instructions:  I discussed the assessment and treatment plan with the patient.  The patient was provided an opportunity to ask questions and all were answered.  The patient agreed with the plan and demonstrated understanding of instructions.  The patient was advised to call back or seek an in person evaluation if the symptoms worsen or if the condition fails to improve as anticipated.    Dr. Charlaine Dalton Lily at [facility] 08/16/2018 12:26 PM

## 2018-08-21 ENCOUNTER — Ambulatory Visit: Payer: Self-pay | Attending: Internal Medicine | Admitting: Internal Medicine

## 2018-08-21 ENCOUNTER — Other Ambulatory Visit: Payer: Self-pay

## 2018-08-21 DIAGNOSIS — E782 Mixed hyperlipidemia: Secondary | ICD-10-CM

## 2018-08-21 DIAGNOSIS — Z7189 Other specified counseling: Secondary | ICD-10-CM

## 2018-08-21 DIAGNOSIS — I10 Essential (primary) hypertension: Secondary | ICD-10-CM

## 2018-08-21 DIAGNOSIS — D373 Neoplasm of uncertain behavior of appendix: Secondary | ICD-10-CM

## 2018-08-21 NOTE — Progress Notes (Signed)
Virtual Visit via Telephone Note Due to current restrictions/limitations of in-office visits due to the COVID-19 pandemic, this scheduled clinical appointment was converted to a telehealth visit  I connected with Tina Johnson on 08/21/18 at 9:42 a.m EDT by telephone from my office and verified that I am speaking with the correct person using two identifiers.  Pt is at home. Tina Johnson interpreters use: [phone].  No other participants beside pt, myself and interpreter   I discussed the limitations, risks, security and privacy concerns of performing an evaluation and management service by telephone and the availability of in person appointments. I also discussed with the patient that there may be a patient responsible charge related to this service. The patient expressed understanding and agreed to proceed.   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 []  Yes    [x]  No Chest Pain?: []  Yes    [x]  No Leg swelling?: []  Yes    [x]  No Headaches?: []  Yes    [x]  No Dizziness? []  Yes    [x]  No Comments:  Not exercising that much because all the gyms are close and it has been cold outside.  Now that weather warming, she plans to get out and walk  HL:  Tolerating lower dose of Pravachol   Hx low-grade appendiceal mucinous neoplasm: Pt had question about when she would be due again for c-scope.  Saw oncologist, Dr. Rogue Johnson 08/16/2018.  No further surveillance needed for the low grade neoplasm.  Next c-scope per Dr. Henrene Johnson is 10 yrs from last c-scope that was in 2018.  This means she will be due in 2028  Stressed  about current COVID19 pandemic.  Sometimes she feels anxious.  She tries to avoid going out.  Goes only to grocery store  Observations/Objective: No direct observation done  Assessment and Plan: 1. Essential hypertension Reported blood pressure at goal.  She will continue amlodipine.  2. Mixed hyperlipidemia Continue Pravachol 20 mg half a tab daily.  3. Low grade mucinous neoplasm of appendix No further surveillance needed per oncology. She will be due for repeat colonoscopy in 2028  4. Educated About Covid-19 Virus Infection Educated patient on ways to avoid getting COVID-19 infection.  Told to avoid touching face as much as possible without washing hands first, good and frequent handwashing, exercising social distancing and optionally wearing a mask while in public   Follow Up Instructions: F/u in 3-4 mths   I discussed the assessment and treatment plan with the patient. The patient was provided an opportunity to ask questions and all were answered. The patient agreed with the plan and demonstrated an understanding of the instructions.   The patient was advised to call back or seek an in-person evaluation if the symptoms worsen or if the condition fails to improve as anticipated.  I provided 17 minutes of non-face-to-face time during this encounter.   Karle Plumber, MD

## 2018-08-21 NOTE — Progress Notes (Signed)
Pt has questions regarding colonoscopy.

## 2018-09-20 ENCOUNTER — Encounter: Payer: Self-pay | Admitting: Internal Medicine

## 2018-09-28 ENCOUNTER — Telehealth: Payer: Self-pay | Admitting: Internal Medicine

## 2018-09-28 NOTE — Telephone Encounter (Signed)
Pt had colonoscopy in 2018 with Dr. Henrene Pastor.  Result letter stated that pt has a 10-year recall, but pt has a recall for 2020.  Please review.

## 2018-09-29 NOTE — Telephone Encounter (Signed)
I called patient to try to help her. She had spanish as the language listed so when she did not answer I left her a detailed message in spanish to call back and ask for me.

## 2018-09-29 NOTE — Telephone Encounter (Signed)
Thank you!  She was, in fact, a one year recall after procedure last year.

## 2018-10-25 NOTE — Telephone Encounter (Signed)
Office note dated 08/30/2017 states a 1 year colonoscopy recall because of appendiceal mucinous neoplasm.  Per note dated 08/21/18 patient saw oncologist on 08/16/2018 and the note states she does not need another colonoscopy until 2028.  Please advise.

## 2018-10-25 NOTE — Telephone Encounter (Signed)
Needs follow-up as I recommended.  Thanks

## 2018-10-25 NOTE — Telephone Encounter (Signed)
Pt called regarding message that we left in her phone. I told her that she was due for a repeat colon but she stated that the surgeon who did her laparoscopy told her that she did not need a colon in 5 or 10 years. Pt would like for Dr. Henrene Pastor to speak with surgeon to agree on a recall date for her next colonoscopy.

## 2018-10-26 NOTE — Telephone Encounter (Signed)
Tina Johnson would you please call this patient for me?  As you can see I sent this to Dr. Henrene Pastor and he still wants her to have the follow up colonoscopy he recommended last year.  So she probably needs a visit too.  Thank you !!!!

## 2018-10-26 NOTE — Telephone Encounter (Signed)
Pt scheduled for ov on 8/3 with Dr. Henrene Pastor.

## 2018-11-20 ENCOUNTER — Encounter: Payer: Self-pay | Admitting: Internal Medicine

## 2018-11-20 ENCOUNTER — Ambulatory Visit (INDEPENDENT_AMBULATORY_CARE_PROVIDER_SITE_OTHER): Payer: Self-pay | Admitting: Internal Medicine

## 2018-11-20 VITALS — BP 120/74 | HR 68 | Temp 98.3°F | Ht <= 58 in | Wt 122.1 lb

## 2018-11-20 DIAGNOSIS — R140 Abdominal distension (gaseous): Secondary | ICD-10-CM

## 2018-11-20 DIAGNOSIS — Z85038 Personal history of other malignant neoplasm of large intestine: Secondary | ICD-10-CM

## 2018-11-20 DIAGNOSIS — Z08 Encounter for follow-up examination after completed treatment for malignant neoplasm: Secondary | ICD-10-CM

## 2018-11-20 NOTE — Patient Instructions (Signed)

## 2018-11-20 NOTE — Progress Notes (Signed)
HISTORY OF PRESENT ILLNESS:  Tina Johnson is a non-English-speaking Hispanic 53 y.o. female with a history of mucinous neoplasm of the appendix diagnosed incidentally on routine colonoscopy November 2018.  She is status post laparoscopic cecal resection January 2019.  Recommendation for completion right hemicolectomy performed June 2019.  She is accompanied today by a professional interpreter.  She is due for routine follow-up surveillance colonoscopy.  She continues with intermittent complaints of abdominal bloating and distention which she feels is exacerbated by certain meals and anxiety.  She has had significant anxiety over coronavirus.  No nausea or vomiting.  No GI bleeding.  Review of outside blood work from July 2019 finds hemoglobin 13.5.  No relevant interval radiology.  REVIEW OF SYSTEMS:  All non-GI ROS negative unless otherwise stated in the HPI except for arthritis, anxiety  Past Medical History:  Diagnosis Date  . Abnormality of gait 04/26/2014  . Anxiety    controls with exercise   . Arthritis    knees , shoulders  . Bradycardia 05/31/2017  . DEPRESSION 08/14/2007   Qualifier: Diagnosis of  By: Bertram Gala    . Family history of diabetes mellitus (DM) 01/23/2014  . 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 GERD 08/14/2007   Qualifier: Diagnosis of  By: Bertram Gala    . HEADACHE, CHRONIC 08/14/2007   Qualifier: Diagnosis of  By: Bertram Gala    . HYPERLIPIDEMIA 08/14/2007   Qualifier: Diagnosis of  By: Bertram Gala    . Indigestion    no medicine at present  . 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 Neoplasm of appendix   . OSTEOARTHRITIS 08/14/2007   Qualifier: Diagnosis of  By: Bertram Gala    . Other bilateral secondary osteoarthritis of knee 04/03/2015  . PONV (postoperative nausea and vomiting) 1999   partial hysterectomy in [place]  . Right knee DJD 01/23/2014  . S/P TKR (total knee replacement),  bilateral 10/07/2016    Past Surgical History:  Procedure Laterality Date  . ABDOMINAL HYSTERECTOMY    . APPENDECTOMY    . JOINT REPLACEMENT Bilateral 2016   [place]  . LAPAROSCOPIC APPENDECTOMY N/A 05/02/2017   Procedure: LAPAROSCOPIC CECECTOMY;  Surgeon: Jules Husbands, MD;  Location: [facility];  Service: General;  Laterality: N/A;  Please open Lap appy tray.  Have gelport and Right hemicolectomy cart in the room.   Marland Kitchen LAPAROSCOPIC RIGHT HEMI COLECTOMY Right 09/26/2017   Procedure: LAPAROSCOPIC RIGHT HEMI COLECTOMY;  Surgeon: Jules Husbands, MD;  Location: [facility];  Service: General;  Laterality: Right;  . TOTAL ABDOMINAL HYSTERECTOMY W/ BILATERAL SALPINGOOPHORECTOMY  2001   done at [facility]  . TOTAL KNEE ARTHROPLASTY Right 04/03/2015   Procedure: RIGHT TOTAL KNEE ARTHROPLASTY;  Surgeon: Leandrew Koyanagi, MD;  Location: [facility];  Service: Orthopedics;  Laterality: Right;  . TOTAL KNEE ARTHROPLASTY Left 04/03/2015   Procedure: LEFT TOTAL KNEE ARTHROPLASTY;  Surgeon: Leandrew Koyanagi, MD;  Location: [facility];  Service: Orthopedics;  Laterality: Left;  Marland Kitchen VAGINAL DELIVERY     ?x2    Social History Tina Johnson  reports that she has never smoked. She has never used smokeless tobacco. She reports that she does not drink alcohol or use drugs.  family history includes Diabetes in her brother, brother, brother, mother, sister, sister, and sister.  Allergies  Allergen Reactions  . Norco [Hydrocodone-Acetaminophen] Shortness Of Breath  . Latex Rash  PHYSICAL EXAMINATION: Vital signs: BP 120/74 (BP Location: Left Arm, Patient Position: Sitting, Cuff Size: Normal)   Pulse 68   Temp 98.3 F (36.8 C)   Ht 4\' 6"  (1.372 m) Comment: height measured without shoes  Wt 122 lb 2 oz (55.4 kg)   BMI 29.45 kg/m   Constitutional: Pleasant, generally well-appearing, no acute distress Psychiatric: alert and oriented x3, cooperative.  Anxious Eyes: extraocular movements intact,  anicteric, conjunctiva pink Mouth: oral pharynx moist, no lesions Neck: supple no lymphadenopathy Cardiovascular: heart regular rate and rhythm, no murmur Lungs: clear to auscultation bilaterally Abdomen: soft, nontender, nondistended, no obvious ascites, no peritoneal signs, normal bowel sounds, no organomegaly Rectal: Deferred until colonoscopy Extremities: no clubbing, cyanosis, or lower extremity edema bilaterally Skin: no lesions on visible extremities Neuro: No focal deficits.  Cranial nerves intact  ASSESSMENT:  1.  Mucinous neoplasm of the appendix diagnosed incidentally on routine colonoscopy November 2018 status post laparoscopic cecal resection January 2019 followed by completion right hemicolectomy June 2019.  Due for surveillance 2.  Chronic abdominal bloating and distention.  Ongoing   PLAN:  1.  Colonoscopy.The nature of the procedure, as well as the risks, benefits, and alternatives were carefully and thoroughly reviewed with the patient. Ample time for discussion and questions allowed. The patient understood, was satisfied, and agreed to proceed. 2.  Anti-gas diet This was in person visit with face-to-face time 25 minutes greater than 50% of which was used for counseling regarding her history of mucinous neoplasm appendix, the need for surveillance colonoscopy, and diet.  Recommendations regarding chronic abdominal bloating with distention

## 2018-11-23 ENCOUNTER — Ambulatory Visit: Payer: Self-pay | Attending: Internal Medicine | Admitting: Internal Medicine

## 2018-11-23 ENCOUNTER — Other Ambulatory Visit: Payer: Self-pay

## 2018-11-23 DIAGNOSIS — E782 Mixed hyperlipidemia: Secondary | ICD-10-CM

## 2018-11-23 DIAGNOSIS — Z9189 Other specified personal risk factors, not elsewhere classified: Secondary | ICD-10-CM

## 2018-11-23 DIAGNOSIS — I10 Essential (primary) hypertension: Secondary | ICD-10-CM

## 2018-11-23 DIAGNOSIS — C181 Malignant neoplasm of appendix: Secondary | ICD-10-CM

## 2018-11-23 MED ORDER — AMLODIPINE BESYLATE 5 MG PO TABS: 5.0000 mg | ORAL_TABLET | Freq: Every day | ORAL | 2 refills | Status: DC

## 2018-11-23 MED ORDER — PRAVASTATIN SODIUM 20 MG PO TABS: 10.0000 mg | ORAL_TABLET | Freq: Every day | ORAL | 6 refills | Status: DC

## 2018-11-23 NOTE — Progress Notes (Signed)
Virtual Visit via Telephone Note Due to current restrictions/limitations of in-office visits due to the COVID-19 pandemic, this scheduled clinical appointment was converted to a telehealth visit I connected with Tina Johnson on 11/23/18 at 4:42 p.m by telephone and verified that I am speaking with the correct person using two identifiers.   I am in my office.  The patient is at home.  Only the patient, Tina Johnson [phone] from [employer]) and myself participated in this encounter.  I discussed the limitations, risks, security and privacy concerns of performing an evaluation and management service by telephone and the availability of in person appointments. I also discussed with the patient that there may be a patient responsible charge related to this service. The patient expressed understanding and agreed to proceed.   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  Last evaluated 08/2018.  Purpose of today's visit is chronic disease management.  No major issues since we last spoke in May.  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 Henrene Pastor next mth.  However I note that patient did see an oncologist  Dr. Rogue Bussing 08/16/2018 and he recommended no further surveillance needed for the low grade neoplasm but left it up to Dr. Henrene Pastor to decide whether further surveillance is needed.  HTN:  BP checked 3 days ago at appt with GI Dr. Henrene Pastor.  Reading was 120/74.  Reports she also recently saw dentist and BP was good then to.   Compliant with Norvasc and salt restriction.  Does well with eating habits and exercise regularly No CP/SOB/LE edema  HL:  Does well with eating habits  Request dental referral for routine care   Current Outpatient Medications on File Prior to Visit  Medication Sig Dispense Refill  . amLODipine (NORVASC) 5 MG  tablet Take 1 tablet (5 mg total) by mouth daily. 90 tablet 0  . CALCIUM PO Take 1 tablet by mouth 2 (two) times daily.     . Cholecalciferol (VITAMIN D3) 2000 units TABS Take 2,000 Units by mouth daily.     Marland Kitchen conjugated estrogens (PREMARIN) vaginal cream Place 1 g vaginally 2 (two) times a week. Monday & Thursday    . Multiple Vitamin (MULTIVITAMIN WITH MINERALS) TABS tablet Take 1 tablet by mouth 2 (two) times daily.    . OMEGA-3 FATTY ACIDS PO Take 1 capsule by mouth 2 (two) times daily.     . pravastatin (PRAVACHOL) 20 MG tablet Take 1 tablet (20 mg total) by mouth daily. (Patient taking differently: Take 10 mg by mouth daily. ) 90 tablet 3   No current facility-administered medications on file prior to visit.     Observations/Objective: No direct observation done  Assessment and Plan: 1. Essential hypertension At goal.  Continue amlodipine and low-salt diet.  Encouraged her to continue regular exercise - amLODipine (NORVASC) 5 MG tablet; Take 1 tablet (5 mg total) by mouth daily.  Dispense: 90 tablet; Refill: 2 - CBC; Future - Comprehensive metabolic panel; Future  2. Primary malignant neoplasm of appendix [facility]) Patient for surveillance colonoscopy per Dr. Henrene Pastor  3. Mixed hyperlipidemia - pravastatin (PRAVACHOL) 20 MG tablet; Take 0.5 tablets (10 mg total) by mouth daily.  Dispense: 30 tablet; Refill: 6  4. Need for dental care - Ambulatory referral to Dentistry   Follow Up Instructions: 4 mths   I discussed the assessment and treatment plan with the patient.  The patient was provided an opportunity to ask questions and all were answered. The patient agreed with the plan and demonstrated an understanding of the instructions.   The patient was advised to call back or seek an in-person evaluation if the symptoms worsen or if the condition fails to improve as anticipated.  I provided 12 minutes of non-face-to-face time during this encounter.   Karle Plumber, MD

## 2018-11-29 ENCOUNTER — Telehealth: Payer: Self-pay | Admitting: Internal Medicine

## 2018-11-29 ENCOUNTER — Ambulatory Visit: Payer: Self-pay

## 2018-11-29 ENCOUNTER — Other Ambulatory Visit: Payer: Self-pay

## 2018-11-29 ENCOUNTER — Ambulatory Visit: Payer: Self-pay | Attending: Internal Medicine

## 2018-11-29 DIAGNOSIS — I10 Essential (primary) hypertension: Secondary | ICD-10-CM

## 2018-11-29 NOTE — Telephone Encounter (Signed)
Called Pt to remind her that her appt is @2pm  with tele visit for financial, lVM

## 2018-11-30 LAB — CBC
Hematocrit: 41.9 % (ref 34.0–46.6)
Hemoglobin: 14 g/dL (ref 11.1–15.9)
MCH: 30.9 pg (ref 26.6–33.0)
MCHC: 33.4 g/dL (ref 31.5–35.7)
MCV: 93 fL (ref 79–97)
Platelets: 242 10*3/uL (ref 150–450)
RBC: 4.53 x10E6/uL (ref 3.77–5.28)
RDW: 13.7 % (ref 11.7–15.4)
WBC: 7.7 10*3/uL (ref 3.4–10.8)

## 2018-11-30 LAB — COMPREHENSIVE METABOLIC PANEL
ALT: 21 IU/L (ref 0–32)
AST: 27 IU/L (ref 0–40)
Albumin/Globulin Ratio: 1.7 (ref 1.2–2.2)
Albumin: 4.3 g/dL (ref 3.8–4.9)
Alkaline Phosphatase: 89 IU/L (ref 39–117)
BUN/Creatinine Ratio: 44 — ABNORMAL HIGH (ref 9–23)
BUN: 26 mg/dL — ABNORMAL HIGH (ref 6–24)
Bilirubin Total: 0.4 mg/dL (ref 0.0–1.2)
CO2: 24 mmol/L (ref 20–29)
Calcium: 9.5 mg/dL (ref 8.7–10.2)
Chloride: 99 mmol/L (ref 96–106)
Creatinine, Ser: 0.59 mg/dL (ref 0.57–1.00)
GFR calc Af Amer: 120 mL/min/{1.73_m2} (ref >59)
GFR calc non Af Amer: 104 mL/min/{1.73_m2} (ref >59)
Globulin, Total: 2.6 g/dL (ref 1.5–4.5)
Glucose: 102 mg/dL — ABNORMAL HIGH (ref 65–99)
Potassium: 4.1 mmol/L (ref 3.5–5.2)
Sodium: 138 mmol/L (ref 134–144)
Total Protein: 6.9 g/dL (ref 6.0–8.5)

## 2018-12-05 ENCOUNTER — Telehealth: Payer: Self-pay

## 2018-12-05 NOTE — Telephone Encounter (Signed)
[employer] interpre[employer] chrisitan  Id# [phone]  contacted pt to go over lab results pt is aware of results and doesn't have any questions or concerns

## 2018-12-20 ENCOUNTER — Telehealth: Payer: Self-pay | Admitting: *Deleted

## 2018-12-20 ENCOUNTER — Telehealth: Payer: Self-pay

## 2018-12-20 NOTE — Telephone Encounter (Signed)
Covid-19 screening questions   Do you now or have you had a fever in the last 14 days? NO   Do you have any respiratory symptoms of shortness of breath or cough now or in the last 14 days? NO  Do you have any family members or close contacts with diagnosed or suspected Covid-19 in the past 14 days? NO  Have you been tested for Covid-19 and found to be positive? NO        

## 2018-12-20 NOTE — Telephone Encounter (Signed)
Opened in error

## 2018-12-21 ENCOUNTER — Encounter: Payer: Self-pay | Admitting: Internal Medicine

## 2018-12-21 ENCOUNTER — Other Ambulatory Visit: Payer: Self-pay

## 2018-12-21 ENCOUNTER — Ambulatory Visit (AMBULATORY_SURGERY_CENTER): Payer: Self-pay | Admitting: Internal Medicine

## 2018-12-21 VITALS — BP 110/54 | HR 50 | Temp 97.9°F | Resp 16 | Ht <= 58 in | Wt 121.0 lb

## 2018-12-21 DIAGNOSIS — C181 Malignant neoplasm of appendix: Secondary | ICD-10-CM

## 2018-12-21 MED ORDER — SODIUM CHLORIDE 0.9 % IV SOLN: 500.0000 mL | Freq: Once | INTRAVENOUS | Status: DC

## 2018-12-21 NOTE — Patient Instructions (Addendum)
Handout given para las hemorroides.  USTED TUVO UN PROCEDIMIENTO ENDOSCPICO HOY EN EL [facility]:   Lea el informe del procedimiento que se le entreg para cualquier pregunta especfica sobre lo que se Primary school teacher.  Si el informe del examen no responde a sus preguntas, por favor llame a su gastroenterlogo para aclararlo.  Si usted solicit que no se le den [employer] de lo que se Estate manager/land agent en su procedimiento al [employer] va a cuidar, entonces el informe del procedimiento se ha incluido en un sobre sellado para que usted lo revise despus cuando le sea ms conveniente.   LO QUE PUEDE ESPERAR: Algunas sensaciones de hinchazn en el abdomen.  Puede tener ms gases de lo normal.  El caminar puede ayudarle a eliminar el aire que se le puso en el tracto gastrointestinal durante el procedimiento y reducir la hinchazn.  Si le hicieron una endoscopia inferior (como una colonoscopia o una sigmoidoscopia flexible), podra notar manchas de sangre en las heces fecales o en el papel higinico.  Si se someti a una preparacin intestinal para su procedimiento, es posible que no tenga una evacuacin intestinal normal durante [employer].   Tenga en cuenta:  Es posible que note un poco de irritacin y congestin en la nariz o algn drenaje.  Esto es debido al oxgeno [employer] durante su procedimiento.  No hay que preocuparse y esto debe desaparecer ms o Scientist, research (medical).   SNTOMAS PARA REPORTAR INMEDIATAMENTE:  Despus de una endoscopia inferior (colonoscopia o sigmoidoscopia flexible):  Cantidades excesivas de sangre en las heces fecales  Sensibilidad significativa o empeoramiento de los dolores abdominales   Hinchazn aguda del abdomen que antes no tena   Fiebre de 100F o ms   Para asuntos urgentes o de Freight forwarder, puede comunicarse con un gastroenterlogo a cualquier hora llamando al [phone].  DIETA:  Recomendamos una comida pequea al principio, pero luego  puede continuar con su dieta normal.  Tome muchos lquidos, Teacher, adult education las bebidas alcohlicas durante 24 horas.    ACTIVIDAD:  Debe planear tomarse las cosas con calma por el resto del da y no debe CONDUCIR ni usar maquinaria pesada Programmer, applications (debido a los medicamentos de sedacin utilizados durante el examen).     SEGUIMIENTO: Nuestro personal llamar al nmero que aparece en su historial al siguiente da hbil de su procedimiento para ver cmo se siente y para responder cualquier pregunta o inquietud que pueda tener con respecto a la informacin que se le dio despus del procedimiento. Si no podemos contactarle, le dejaremos un mensaje.  Sin embargo, si se siente bien y no tiene Paediatric nurse, no es necesario que nos devuelva la llamada.  Asumiremos que ha regresado a sus actividades diarias normales sin incidentes. Si se le tomaron algunas biopsias, le contactaremos por telfono o por carta en las prximas 3 semanas.  Si no ha sabido [employer] biopsias en el transcurso de 3 semanas, por favor llmenos al [phone].   FIRMAS/CONFIDENCIALIDAD: Usted y/o el acompaante que le cuide han firmado documentos que se ingresarn en su historial mdico electrnico.  Estas firmas atestiguan el hecho de que la informacin anterior

## 2018-12-21 NOTE — Progress Notes (Signed)
Pt's states no medical or surgical changes since previsit or office visit.  Interpreter was [employer], Temp JB, IV AW.

## 2018-12-21 NOTE — Progress Notes (Signed)
Report to PACU, RN, vss, BBS= Clear.  

## 2018-12-21 NOTE — Op Note (Signed)
[facility] Patient Name: Tina Johnson Procedure Date: 12/21/2018 8:45 AM MRN: [id number] Endoscopist: Docia Chuck. Henrene Pastor , MD Age: 53 Referring MD:  Date of Birth: 07-Oct-1965 Gender: Female Account #: [id number] Procedure:                Colonoscopy Indications:              High risk colon cancer surveillance: Personal                            history of colon cancer (appendiceal                            carcinoma?"mucinous neoplasm) on colonoscopy                            November 2018. Subsequent cecal resection January                            2019 with eventual completion right hemicolectomy                            June 2019. Now for follow-up Medicines:                Monitored Anesthesia Care Procedure:                Pre-Anesthesia Assessment:                           - Prior to the procedure, a History and Physical                            was performed, and patient medications and                            allergies were reviewed. The patient's tolerance of                            previous anesthesia was also reviewed. The risks                            and benefits of the procedure and the sedation                            options and risks were discussed with the patient.                            All questions were answered, and informed consent                            was obtained. Prior Anticoagulants: The patient has                            taken no previous anticoagulant or antiplatelet  agents. ASA Grade Assessment: I - A normal, healthy                            patient. After reviewing the risks and benefits,                            the patient was deemed in satisfactory condition to                            undergo the procedure.                           After obtaining informed consent, the colonoscope                            was passed under direct vision. Throughout the                   procedure, the patient's blood pressure, pulse, and                            oxygen saturations were monitored continuously. The                            Colonoscope was introduced through the anus and                            advanced to the the ileocolonic anastomosis. The                            terminal ileum and the rectum were photographed.                            The quality of the bowel preparation was excellent.                            The colonoscopy was performed without difficulty.                            The patient tolerated the procedure well. The bowel                            preparation used was SUPREP via split dose                            instruction. Scope In: 8:58:54 AM Scope Out: 9:11:29 AM Scope Withdrawal Time: 0 hours 8 minutes 32 seconds  Total Procedure Duration: 0 hours 12 minutes 35 seconds  Findings:                 The entire examined colon appeared normal on direct                            and retroflexion views. Internal hemorrhoids  present. Complications:            No immediate complications. Estimated blood loss:                            None. Estimated Blood Loss:     Estimated blood loss: none. Impression:               - The entire examined colon is normal on direct and                            retroflexion views.                           - No specimens collected. Recommendation:           - Repeat colonoscopy in 5 years for surveillance.                           - Patient has a contact number available for                            emergencies. The signs and symptoms of potential                            delayed complications were discussed with the                            patient. Return to normal activities tomorrow.                            Written discharge instructions were provided to the                            patient.                           - Resume  previous diet.                           - Continue present medications. Docia Chuck. Henrene Pastor, MD 12/21/2018 9:18:09 AM This report has been signed electronically.

## 2018-12-21 NOTE — Progress Notes (Signed)
Upon arrival interpreter Enis Gash called back to be with pt. For explanation of recovery procedure and to speak w/ dr Henrene Pastor and with RN for instructions.

## 2018-12-26 ENCOUNTER — Telehealth: Payer: Self-pay

## 2018-12-26 NOTE — Telephone Encounter (Signed)
  Follow up Call-  Call back number 12/21/2018 03/17/2017  Post procedure Call Back phone  # [phone], may speak with son, patient spanish speaking only  Permission to leave phone message Yes Yes  Some recent data might be hidden     Patient questions:  Do you have a fever, pain , or abdominal swelling? No. Pain Score  0 *  Have you tolerated food without any problems? Yes.    Have you been able to return to your normal activities? Yes.    Do you have any questions about your discharge instructions: Diet   No. Medications  No. Follow up visit  No.  Do you have questions or concerns about your Care? No.  Actions: * If pain score is 4 or above: No action needed, pain <4.  1. Have you developed a fever since your procedure? no  2.   Have you had an respiratory symptoms (SOB or cough) since your procedure? no  3.   Have you tested positive for COVID 19 since your procedure no  4.   Have you had any family members/close contacts diagnosed with the COVID 19 since your procedure?  no   If yes to any of these questions please route to Joylene John, RN and Alphonsa Gin, Therapist, sports.

## 2019-01-01 ENCOUNTER — Other Ambulatory Visit: Payer: Self-pay

## 2019-01-01 DIAGNOSIS — Z20822 Contact with and (suspected) exposure to covid-19: Secondary | ICD-10-CM

## 2019-01-02 LAB — NOVEL CORONAVIRUS, NAA: SARS-CoV-2, NAA: NOT DETECTED

## 2019-03-29 ENCOUNTER — Other Ambulatory Visit: Payer: Self-pay

## 2019-03-29 DIAGNOSIS — Z20822 Contact with and (suspected) exposure to covid-19: Secondary | ICD-10-CM

## 2019-04-01 LAB — NOVEL CORONAVIRUS, NAA: SARS-CoV-2, NAA: DETECTED — AB

## 2019-04-02 ENCOUNTER — Telehealth: Payer: Self-pay | Admitting: Nurse Practitioner

## 2019-04-02 NOTE — Telephone Encounter (Signed)
Called to Discuss with patient about Covid symptoms and the use of bamlanivimab, a monoclonal antibody infusion for those with mild to moderate Covid symptoms and at a high risk of hospitalization.    Patient Active Problem List   Diagnosis Date Noted  . S/P right colectomy 09/26/2017  . Low grade mucinous neoplasm of appendix   . 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 Bradycardia 05/31/2017  . S/P TKR (total knee replacement), bilateral 10/07/2016  . 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 Family history of diabetes mellitus (DM) 01/23/2014  . Right knee DJD 01/23/2014  . Hyperlipidemia 01/23/2014  . HYPERLIPIDEMIA 08/14/2007  . DEPRESSION 08/14/2007  . GERD 08/14/2007  . HEADACHE, CHRONIC 08/14/2007      Pt is qualified for this infusion at the [facility] infusion center due to co-morbid conditions and/or a member of an at-risk group.     Patient states that she is no longer having symptoms.   Symptoms tier reviewed as well as criteria for ending isolation. Preventative practices reviewed. Patient verbalized understanding.

## 2019-04-27 ENCOUNTER — Telehealth: Payer: Self-pay

## 2019-04-27 NOTE — Telephone Encounter (Signed)
Called patient to let her know message below. I will contact the dental office Monday since they are closed today to let them know.   Dental Office [phone]

## 2019-04-27 NOTE — Telephone Encounter (Signed)
Patient called and has appt to get her teeth cleaned. Would like to know if she needs premeds?

## 2019-04-27 NOTE — Telephone Encounter (Signed)
It's been over 2 years so she doesn't need it unless major dental work needs to be done.

## 2019-04-30 NOTE — Telephone Encounter (Signed)
Called Dental office and they need an order stating this. Needs to be faxed to [phone].

## 2019-05-01 NOTE — Telephone Encounter (Signed)
faxed

## 2020-01-21 ENCOUNTER — Ambulatory Visit: Payer: Self-pay

## 2020-01-21 ENCOUNTER — Ambulatory Visit (INDEPENDENT_AMBULATORY_CARE_PROVIDER_SITE_OTHER): Payer: Self-pay | Admitting: Physician Assistant

## 2020-01-21 ENCOUNTER — Encounter: Payer: Self-pay | Admitting: Physician Assistant

## 2020-01-21 DIAGNOSIS — M25562 Pain in left knee: Secondary | ICD-10-CM

## 2020-01-21 MED ORDER — LIDOCAINE HCL 1 % IJ SOLN
3.0000 mL | INTRAMUSCULAR | Status: AC | PRN
  Administered 2020-01-21: 3 mL

## 2020-01-21 NOTE — Progress Notes (Signed)
Office Visit Note   Patient: Tina Johnson           Date of Birth: 05-02-1965           MRN: [id number] Visit Date: 01/21/2020              Requested by: Ladell Pier, MD [address] [place],  [place] [address] PCP: Ladell Pier, MD   Assessment & Plan: Visit Diagnoses:  1. Acute pain of left knee     Plan: Knee is wrapped with an ace bandage which she will use on until this evening.  She is weightbearing as tolerated on the left hand.  She will follow-up with Dr. Erlinda Hong as planned on October 12.  Questions were encouraged and answered at length today an interpreter was used to speak with the patient.  Follow-Up Instructions: Return Dr. Erlinda Hong 10/12.   Orders:  Orders Placed This Encounter  Procedures  . Large Joint Inj  . XR Knee 1-2 Views Left   No orders of the defined types were placed in this encounter.     Procedures: Large Joint Inj: L knee on 01/21/2020 4:49 PM Indications: pain and joint swelling Details: 22 G 1.5 in needle, anterolateral approach  Arthrogram: No  Medications: 3 mL lidocaine 1 % Aspirate: 37 mL bloody Outcome: tolerated well, no immediate complications Procedure, treatment alternatives, risks and benefits explained, specific risks discussed. Consent was given by the patient. Immediately prior to procedure a time out was called to verify the correct patient, procedure, equipment, support staff and site/side marked as required. Patient was prepped and draped in the usual sterile fashion.       Clinical Data: No additional findings.   Subjective: Chief Complaint  Patient presents with  . Left Knee - Pain    HPI  Orit is 54 year old female patient of Dr. Phoebe Sharps who comes in today with an acute injury of the left knee.  She reports that this past Saturday she tripped over down spout came down on her left knee.  She has a history of left total knee arthroplasty April 03, 2015.  She was scheduled to see Dr. Sherrian Divers on  01/29/2020 for follow-up evaluation of the left knee.  Some pain mostly anterior aspect of the knee notes some swelling in the knee.  She has significantly more pain with standing.  She denies any loss of consciousness, dizziness at the time of the injury.  She did come down on her left elbow and hit her face just to the right of her nose.  Review of Systems See HPI  Objective: Vital Signs: There were no vitals taken for this visit.  Physical Exam Constitutional:      Appearance: She is not ill-appearing or diaphoretic.  Pulmonary:     Effort: Pulmonary effort is normal.  Neurological:     Mental Status: She is oriented to person, place, and time.  Psychiatric:        Mood and Affect: Mood normal.     Ortho Exam Left knee: Positive effusion.  No instability valgus varus stressing.  Slight abrasion over the anterior aspect knee.  She is maximally tender over the patellar tibial tendon.  She is shaky but is able to bring the leg out to full extension.  She is able to actively flex to 90 degrees.  Calf supple nontender. Specialty Comments:  No specialty comments available.  Imaging: XR Knee 1-2 Views Left  Result Date: 01/21/2020 Left knee 2 views: No  acute fractures.  Arthroplasty components.  Well-seated.  No bony abnormalities.  Knee is well located.    PMFS History: Patient Active Problem List   Diagnosis Date Noted  . S/P right colectomy 09/26/2017  . Low grade mucinous neoplasm of appendix   . 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 Bradycardia 05/31/2017  . S/P TKR (total knee replacement), bilateral 10/07/2016  . 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 Family history of diabetes mellitus (DM) 01/23/2014  . Right knee DJD 01/23/2014  . Hyperlipidemia 01/23/2014  . HYPERLIPIDEMIA 08/14/2007  . DEPRESSION 08/14/2007  . GERD 08/14/2007  . HEADACHE, CHRONIC 08/14/2007   Past Medical History:  Diagnosis Date    . Abnormality of gait 04/26/2014  . Anxiety    controls with exercise   . Arthritis    knees , shoulders  . Bradycardia 05/31/2017  . DEPRESSION 08/14/2007   Qualifier: Diagnosis of  By: Bertram Gala    . Family history of diabetes mellitus (DM) 01/23/2014  . 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 GERD 08/14/2007   Qualifier: Diagnosis of  By: Bertram Gala    . HEADACHE, CHRONIC 08/14/2007   Qualifier: Diagnosis of  By: Bertram Gala    . HYPERLIPIDEMIA 08/14/2007   Qualifier: Diagnosis of  By: Bertram Gala    . Indigestion    no medicine at present  . 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 Neoplasm of appendix   . OSTEOARTHRITIS 08/14/2007   Qualifier: Diagnosis of  By: Bertram Gala    . Other bilateral secondary osteoarthritis of knee 04/03/2015  . PONV (postoperative nausea and vomiting) 1999   partial hysterectomy in [place]  . Right knee DJD 01/23/2014  . S/P TKR (total knee replacement), bilateral 10/07/2016    Family History  Problem Relation Age of Onset  . Diabetes Sister   . Diabetes Brother   . Diabetes Mother   . Diabetes Sister   . Diabetes Sister   . Diabetes Brother   . Diabetes Brother   . Colon cancer Neg Hx   . Cancer Neg Hx   . Rectal cancer Neg Hx     Past Surgical History:  Procedure Laterality Date  . ABDOMINAL HYSTERECTOMY    . APPENDECTOMY    . JOINT REPLACEMENT Bilateral 2016     . LAPAROSCOPIC APPENDECTOMY N/A 05/02/2017   Procedure: LAPAROSCOPIC CECECTOMY;  Surgeon: Jules Husbands, MD;  Location: [facility];  Service: General;  Laterality: N/A;  Please open Lap appy tray.  Have gelport and Right hemicolectomy cart in the room.   Marland Kitchen LAPAROSCOPIC RIGHT HEMI COLECTOMY Right 09/26/2017   Procedure: LAPAROSCOPIC RIGHT HEMI COLECTOMY;  Surgeon: Jules Husbands, MD;  Location: [facility];  Service: General;  Laterality: Right;  . TOTAL ABDOMINAL HYSTERECTOMY W/ BILATERAL SALPINGOOPHORECTOMY   2001   done at [facility]  . TOTAL KNEE ARTHROPLASTY Right 04/03/2015   Procedure: RIGHT TOTAL KNEE ARTHROPLASTY;  Surgeon: Leandrew Koyanagi, MD;  Location: [place];  Service: Orthopedics;  Laterality: Right;  . TOTAL KNEE ARTHROPLASTY Left 04/03/2015   Procedure: LEFT TOTAL KNEE ARTHROPLASTY;  Surgeon: Leandrew Koyanagi, MD;  Location: [place];  Service: Orthopedics;  Laterality: Left;  Marland Kitchen VAGINAL DELIVERY     ?x2   Social History   Occupational History  . Not on file  Tobacco Use  . Smoking status:  Never Smoker  . Smokeless tobacco: Never Used  Vaping Use  . Vaping Use: Never used  Substance and Sexual Activity  . Alcohol use: No  . Drug use: No  . Sexual activity: Not Currently

## 2020-01-29 ENCOUNTER — Ambulatory Visit: Payer: Self-pay

## 2020-01-29 ENCOUNTER — Encounter: Payer: Self-pay | Admitting: Orthopaedic Surgery

## 2020-01-29 ENCOUNTER — Ambulatory Visit (INDEPENDENT_AMBULATORY_CARE_PROVIDER_SITE_OTHER): Payer: Self-pay | Admitting: Orthopaedic Surgery

## 2020-01-29 VITALS — Ht <= 58 in | Wt 121.0 lb

## 2020-01-29 DIAGNOSIS — Z96653 Presence of artificial knee joint, bilateral: Secondary | ICD-10-CM

## 2020-01-29 NOTE — Progress Notes (Signed)
Office Visit Note   Patient: Tina Johnson           Date of Birth: [birth year]           MRN: [id number] Visit Date: 01/29/2020              Requested by: Ladell Pier, MD [address] [place],  [place] [address] PCP: Patient, No Pcp Per   Assessment & Plan: Visit Diagnoses:  1. History of bilateral knee arthroplasty     Plan: I reviewed the x-rays today and looked at the x-rays from a week ago and I do feel that she has a nondisplaced patella fracture.  The patella component appears to be stable and there has not been any interval displacement or worsening of the patella fracture.  We will continue to treat this nonoperatively.  Activity restrictions and limitations were reviewed today through our language interpreter.  I will see her back in 4 weeks with repeat two-view x-rays of the left knee.  In terms of her right knee she has done quite well from the knee replacement and can follow-up as needed.  Follow-Up Instructions: Return in about 4 weeks (around 02/26/2020).   Orders:  Orders Placed This Encounter  Procedures  . XR Knee 1-2 Views Left  . XR Knee 1-2 Views Right   No orders of the defined types were placed in this encounter.     Procedures: No procedures performed   Clinical Data: No additional findings.   Subjective: Chief Complaint  Patient presents with  . Right Knee - Follow-up    Bilateral TKA 04/03/2015  . Left Knee - Follow-up    Patient is a very pleasant 54 year old female who is 5 years status post bilateral total knee replacements.  She was recent evaluated by Benita Stabile for an acute injury to the left knee that occurred about 2 weeks ago.  He aspirated bloody effusion.  She states that overall she has been doing better.   Review of Systems   Objective: Vital Signs: Ht 4\' 6"  (1.372 m)   Wt 121 lb (54.9 kg)   BMI 29.17 kg/m   Physical Exam  Ortho Exam Right knee shows a fully healed surgical scar.  Unremarkable  exam. Left knee shows mild bruising and swelling.  Range of motion is 90 degrees is painless.  Slight tenderness palpation over the patella. Specialty Comments:  No specialty comments available.  Imaging: XR Knee 1-2 Views Left  Result Date: 01/29/2020 Nondisplaced patella fracture  XR Knee 1-2 Views Right  Result Date: 01/29/2020 Stable total knee replacement in good alignment     PMFS History: Patient Active Problem List   Diagnosis Date Noted  . S/P right colectomy 09/26/2017  . Low grade mucinous neoplasm of appendix   . 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 Bradycardia 05/31/2017  . S/P TKR (total knee replacement), bilateral 10/07/2016  . 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 Family history of diabetes mellitus (DM) 01/23/2014  . Right knee DJD 01/23/2014  . Hyperlipidemia 01/23/2014  . HYPERLIPIDEMIA 08/14/2007  . DEPRESSION 08/14/2007  . GERD 08/14/2007  . HEADACHE, CHRONIC 08/14/2007   Past Medical History:  Diagnosis Date  . Abnormality of gait 04/26/2014  . Anxiety    controls with exercise   . Arthritis    knees , shoulders  . Bradycardia 05/31/2017  . DEPRESSION 08/14/2007   Qualifier: Diagnosis of  By: Sharrie Rothman  Tech, [place]    . Family history of diabetes mellitus (DM) 01/23/2014  . 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 GERD 08/14/2007   Qualifier: Diagnosis of  By: Bertram Gala    . HEADACHE, CHRONIC 08/14/2007   Qualifier: Diagnosis of  By: Bertram Gala    . HYPERLIPIDEMIA 08/14/2007   Qualifier: Diagnosis of  By: Bertram Gala    . Indigestion    no medicine at present  . 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 Neoplasm of appendix   . OSTEOARTHRITIS 08/14/2007   Qualifier: Diagnosis of  By: Bertram Gala    . Other bilateral secondary osteoarthritis of knee 04/03/2015  . PONV (postoperative nausea and vomiting) 1999   partial  hysterectomy in [place]  . Right knee DJD 01/23/2014  . S/P TKR (total knee replacement), bilateral 10/07/2016    Family History  Problem Relation Age of Onset  . Diabetes Sister   . Diabetes Brother   . Diabetes Mother   . Diabetes Sister   . Diabetes Sister   . Diabetes Brother   . Diabetes Brother   . Colon cancer Neg Hx   . Cancer Neg Hx   . Rectal cancer Neg Hx     Past Surgical History:  Procedure Laterality Date  . ABDOMINAL HYSTERECTOMY    . APPENDECTOMY    . JOINT REPLACEMENT Bilateral 2016   [place]  . LAPAROSCOPIC APPENDECTOMY N/A 05/02/2017   Procedure: LAPAROSCOPIC CECECTOMY;  Surgeon: Jules Husbands, MD;  Location: [facility];  Service: General;  Laterality: N/A;  Please open Lap appy tray.  Have gelport and Right hemicolectomy cart in the room.   Marland Kitchen LAPAROSCOPIC RIGHT HEMI COLECTOMY Right 09/26/2017   Procedure: LAPAROSCOPIC RIGHT HEMI COLECTOMY;  Surgeon: Jules Husbands, MD;  Location: [facility];  Service: General;  Laterality: Right;  . TOTAL ABDOMINAL HYSTERECTOMY W/ BILATERAL SALPINGOOPHORECTOMY  2001   done at [facility]  . TOTAL KNEE ARTHROPLASTY Right 04/03/2015   Procedure: RIGHT TOTAL KNEE ARTHROPLASTY;  Surgeon: Leandrew Koyanagi, MD;  Location: [place];  Service: Orthopedics;  Laterality: Right;  . TOTAL KNEE ARTHROPLASTY Left 04/03/2015   Procedure: LEFT TOTAL KNEE ARTHROPLASTY;  Surgeon: Leandrew Koyanagi, MD;  Location: [place];  Service: Orthopedics;  Laterality: Left;  Marland Kitchen VAGINAL DELIVERY     ?x2   Social History   Occupational History  . Not on file  Tobacco Use  . Smoking status: Never Smoker  . Smokeless tobacco: Never Used  Vaping Use  . Vaping Use: Never used  Substance and Sexual Activity  . Alcohol use: No  . Drug use: No  . Sexual activity: Not Currently

## 2020-02-25 ENCOUNTER — Other Ambulatory Visit: Payer: Self-pay

## 2020-02-25 ENCOUNTER — Encounter: Payer: Self-pay | Admitting: Internal Medicine

## 2020-02-25 ENCOUNTER — Ambulatory Visit: Payer: Self-pay | Attending: Internal Medicine | Admitting: Internal Medicine

## 2020-02-25 VITALS — BP 150/85 | HR 60 | Temp 98.2°F | Resp 16 | Ht <= 58 in | Wt 123.0 lb

## 2020-02-25 DIAGNOSIS — E663 Overweight: Secondary | ICD-10-CM

## 2020-02-25 DIAGNOSIS — I10 Essential (primary) hypertension: Secondary | ICD-10-CM

## 2020-02-25 DIAGNOSIS — M79674 Pain in right toe(s): Secondary | ICD-10-CM

## 2020-02-25 DIAGNOSIS — E785 Hyperlipidemia, unspecified: Secondary | ICD-10-CM

## 2020-02-25 DIAGNOSIS — K0889 Other specified disorders of teeth and supporting structures: Secondary | ICD-10-CM

## 2020-02-25 NOTE — Progress Notes (Signed)
Dental concerns- would like a referral to dentist.   Great toe, Right foot pain

## 2020-02-25 NOTE — Progress Notes (Signed)
Patient ID: Tina Johnson, female    DOB: 06-27-1965  MRN: [id number]  CC: Establish Care   Subjective: Tina Johnson is a 54 y.o. female who presents for f/u visit.  Eduardo from [facility] is with her. 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    HM:  Completed COVID vaccine series. Had MMG nl  01/08/2020 through [facility].  Has copy of letter sent to her today.  Seeing ortho for LT knee.  Reports has hairline  fx  C/o pain in jt of RT big toe x 4 mths.  Bothersome when she exercises.  Some swelling when she exercises.  Requesting dental referral.   her front 4 teeth in the lower jaw wired together.  Sometimes sore when she eats.  Also has a difficult time cleaning the teeth because of the metal wire. Needs to apply for [employer] discount.   HTN:  Out of Norvasc for over 6 mths.  Denies any chest pain, shortness of breath, lower extremity edema, or dizziness.  Not able to do aerobic exercise recently because of issues with her knee.  However she has been doing some weightlifting.  HL: Out of Pravachol x6 months. Patient Active Problem List   Diagnosis Date Noted  . S/P right colectomy 09/26/2017  . Low grade mucinous neoplasm of appendix   . 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 Bradycardia 05/31/2017  . S/P TKR (total knee replacement), bilateral 10/07/2016  . 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 Family history of diabetes mellitus (DM) 01/23/2014  . Right knee DJD 01/23/2014  . Hyperlipidemia 01/23/2014  . HYPERLIPIDEMIA 08/14/2007  . DEPRESSION 08/14/2007  . GERD 08/14/2007  . HEADACHE, CHRONIC 08/14/2007     Current Outpatient Medications on File Prior to Visit  Medication Sig Dispense Refill  . CALCIUM PO Take 1 tablet by mouth 2 (two) times daily.     . Cholecalciferol  (VITAMIN D3) 2000 units TABS Take 2,000 Units by mouth daily.     Marland Kitchen conjugated estrogens (PREMARIN) vaginal cream Place 1 g vaginally 2 (two) times a week. Monday & Thursday    . Multiple Vitamin (MULTIVITAMIN WITH MINERALS) TABS tablet Take 1 tablet by mouth 2 (two) times daily.    . OMEGA-3 FATTY ACIDS PO Take 1 capsule by mouth 2 (two) times daily.     Marland Kitchen amLODipine (NORVASC) 5 MG tablet Take 1 tablet (5 mg total) by mouth daily. (Patient not taking: Reported on 02/25/2020) 90 tablet 2  . pravastatin (PRAVACHOL) 20 MG tablet Take 0.5 tablets (10 mg total) by mouth daily. (Patient not taking: Reported on 02/25/2020) 30 tablet 6   No current facility-administered medications on file prior to visit.    Allergies  Allergen Reactions  . Norco [Hydrocodone-Acetaminophen] Shortness Of Breath  . Latex Rash    Social History   Socioeconomic History  . Marital status: Married    Spouse name: Not on file  . Number of children: Not on file  . Years of education: Not on file  . Highest education level: Not on file  Occupational History  . Not on file  Tobacco Use  . Smoking status: Never Smoker  . Smokeless tobacco: Never Used  Vaping Use  . Vaping Use: Never used  Substance and Sexual Activity  . Alcohol use: No  . Drug use:  No  . Sexual activity: Not Currently  Other Topics Concern  . Not on file  Social History Narrative  . Not on file   Social Determinants of Health   Financial Resource Strain:   . Difficulty of Paying Living Expenses: Not on file  Food Insecurity:   . Worried About Charity fundraiser in the Last Year: Not on file  . Ran Out of Food in the Last Year: Not on file  Transportation Needs:   . Lack of Transportation (Medical): Not on file  . Lack of Transportation (Non-Medical): Not on file  Physical Activity:   . Days of Exercise per Week: Not on file  . Minutes of Exercise per Session: Not on file  Stress:   . Feeling of Stress : Not on file  Social  Connections:   . Frequency of Communication with Friends and Family: Not on file  . Frequency of Social Gatherings with Friends and Family: Not on file  . Attends Religious Services: Not on file  . Active Member of Clubs or Organizations: Not on file  . Attends Archivist Meetings: Not on file  . Marital Status: Not on file  Intimate Partner Violence:   . Fear of Current or Ex-Partner: Not on file  . Emotionally Abused: Not on file  . Physically Abused: Not on file  . Sexually Abused: Not on file    Family History  Problem Relation Age of Onset  . Diabetes Sister   . Diabetes Brother   . Diabetes Mother   . Diabetes Sister   . Diabetes Sister   . Diabetes Brother   . Diabetes Brother   . Colon cancer Neg Hx   . Cancer Neg Hx   . Rectal cancer Neg Hx     Past Surgical History:  Procedure Laterality Date  . ABDOMINAL HYSTERECTOMY    . APPENDECTOMY    . JOINT REPLACEMENT Bilateral 2016   [place]  . LAPAROSCOPIC APPENDECTOMY N/A 05/02/2017   Procedure: LAPAROSCOPIC CECECTOMY;  Surgeon: Jules Husbands, MD;  Location: [facility];  Service: General;  Laterality: N/A;  Please open Lap appy tray.  Have gelport and Right hemicolectomy cart in the room.   Marland Kitchen LAPAROSCOPIC RIGHT HEMI COLECTOMY Right 09/26/2017   Procedure: LAPAROSCOPIC RIGHT HEMI COLECTOMY;  Surgeon: Jules Husbands, MD;  Location: [facility];  Service: General;  Laterality: Right;  . TOTAL ABDOMINAL HYSTERECTOMY W/ BILATERAL SALPINGOOPHORECTOMY  2001   done at [facility]  . TOTAL KNEE ARTHROPLASTY Right 04/03/2015   Procedure: RIGHT TOTAL KNEE ARTHROPLASTY;  Surgeon: Leandrew Koyanagi, MD;  Location: [place];  Service: Orthopedics;  Laterality: Right;  . TOTAL KNEE ARTHROPLASTY Left 04/03/2015   Procedure: LEFT TOTAL KNEE ARTHROPLASTY;  Surgeon: Leandrew Koyanagi, MD;  Location: [place];  Service: Orthopedics;  Laterality: Left;  Marland Kitchen VAGINAL DELIVERY     ?x2    ROS: Review of Systems Negative except as stated  above  PHYSICAL EXAM: BP (!) 150/85   Pulse 60   Temp 98.2 F (36.8 C) (Oral)   Resp 16   Ht 4' 7.5" (1.41 m)   Wt 123 lb (55.8 kg)   SpO2 97%   BMI 28.08 kg/m   Wt Readings from Last 3 Encounters:  02/25/20 123 lb (55.8 kg)  01/29/20 121 lb (54.9 kg)  12/21/18 121 lb (54.9 kg)    Physical Exam  General appearance - alert, well appearing, and in no distress Mental status -  normal mood, behavior, speech, dress, motor activity, and thought processes Mouth -patient noted to have a wire holding the front 4 teeth in the lower jaw together.  Some receding of the gumline. Neck - supple, no significant adenopathy Chest - clear to auscultation, no wheezes, rales or rhonchi, symmetric air entry Heart - normal rate, regular rhythm, normal S1, S2, no murmurs, rubs, clicks or gallops Musculoskeletal -right big toe: No edema or erythema.  Mild discomfort at the joint. Extremities - peripheral pulses normal, no pedal edema, no clubbing or cyanosis Skin -noted to have calluses on the plantar surface of both big toes CMP Latest Ref Rng & Units 11/29/2018 09/27/2017 09/26/2017  Glucose 65 - 99 mg/dL 102(H) 107(H) -  BUN 6 - 24 mg/dL 26(H) 9 -  Creatinine 0.57 - 1.00 mg/dL 0.59 0.46 0.55  Sodium 134 - 144 mmol/L 138 137 -  Potassium 3.5 - 5.2 mmol/L 4.1 3.9 -  Chloride 96 - 106 mmol/L 99 106 -  CO2 20 - 29 mmol/L 24 22 -  Calcium 8.7 - 10.2 mg/dL 9.5 8.5(L) -  Total Protein 6.0 - 8.5 g/dL 6.9 - -  Total Bilirubin 0.0 - 1.2 mg/dL 0.4 - -  Alkaline Phos 39 - 117 IU/L 89 - -  AST 0 - 40 IU/L 27 - -  ALT 0 - 32 IU/L 21 - -   Lipid Panel     Component Value Date/Time   CHOL 221 (H) 03/21/2018 1612   TRIG 173 (H) 03/21/2018 1612   HDL 55 03/21/2018 1612   CHOLHDL 4.0 03/21/2018 1612   CHOLHDL 3.8 01/21/2016 0921   VLDL 20 01/21/2016 0921   LDLCALC 131 (H) 03/21/2018 1612    CBC    Component Value Date/Time   WBC 7.7 11/29/2018 1408   WBC 6.5 09/30/2017 0457   RBC 4.53 11/29/2018  1408   RBC 3.46 (L) 09/30/2017 0457   HGB 14.0 11/29/2018 1408   HCT 41.9 11/29/2018 1408   PLT 242 11/29/2018 1408   MCV 93 11/29/2018 1408   MCH 30.9 11/29/2018 1408   MCH 31.6 09/30/2017 0457   MCHC 33.4 11/29/2018 1408   MCHC 34.7 09/30/2017 0457   RDW 13.7 11/29/2018 1408   LYMPHSABS 2.1 09/30/2017 0457   MONOABS 0.4 09/30/2017 0457   EOSABS 0.4 09/30/2017 0457   BASOSABS 0.0 09/30/2017 0457    ASSESSMENT AND PLAN:  1. Essential hypertension Not at goal.  Restart amlodipine.  Continue DASH diet. - CBC - Comprehensive metabolic panel - Lipid panel  2. Hyperlipidemia, unspecified hyperlipidemia type Refill Pravachol.  3. Overweight Discussed and encourage healthy eating habits.  She has been doing a good job with this.  She plans to resume aerobic exercise once the issue with her knee is straightened out.  4. Pain of toe of right foot I suspect she probably has a little bit of arthritis in this toe.  I also suggested referral to podiatry when she has the orange card to get the callus on both big toes shaved - DG Toe Great Right; Future  5. Pain, dental Will refer to the dentist when she has the orange card.  I recommend using a floss with threading to floss her teeth   Patient was given the opportunity to ask questions.  Patient verbalized understanding of the plan and was able to repeat key elements of the plan.   Orders Placed This Encounter  Procedures  . DG Toe Great Right  . CBC  . Comprehensive  metabolic panel  . Lipid panel     Requested Prescriptions    No prescriptions requested or ordered in this encounter    Return in about 4 months (around 06/24/2020).  Karle Plumber, MD, FACP

## 2020-02-26 ENCOUNTER — Ambulatory Visit (INDEPENDENT_AMBULATORY_CARE_PROVIDER_SITE_OTHER): Payer: Self-pay

## 2020-02-26 ENCOUNTER — Encounter: Payer: Self-pay | Admitting: Orthopaedic Surgery

## 2020-02-26 ENCOUNTER — Ambulatory Visit (INDEPENDENT_AMBULATORY_CARE_PROVIDER_SITE_OTHER): Payer: Self-pay | Admitting: Orthopaedic Surgery

## 2020-02-26 VITALS — Ht <= 58 in | Wt 123.0 lb

## 2020-02-26 DIAGNOSIS — M79674 Pain in right toe(s): Secondary | ICD-10-CM

## 2020-02-26 DIAGNOSIS — M25562 Pain in left knee: Secondary | ICD-10-CM

## 2020-02-26 LAB — CBC
Hematocrit: 44.3 % (ref 34.0–46.6)
Hemoglobin: 14.6 g/dL (ref 11.1–15.9)
MCH: 30.4 pg (ref 26.6–33.0)
MCHC: 33 g/dL (ref 31.5–35.7)
MCV: 92 fL (ref 79–97)
Platelets: 227 10*3/uL (ref 150–450)
RBC: 4.81 x10E6/uL (ref 3.77–5.28)
RDW: 13.1 % (ref 11.7–15.4)
WBC: 9 10*3/uL (ref 3.4–10.8)

## 2020-02-26 LAB — COMPREHENSIVE METABOLIC PANEL
ALT: 17 IU/L (ref 0–32)
AST: 21 IU/L (ref 0–40)
Albumin/Globulin Ratio: 1.6 (ref 1.2–2.2)
Albumin: 4.5 g/dL (ref 3.8–4.9)
Alkaline Phosphatase: 87 IU/L (ref 44–121)
BUN/Creatinine Ratio: 39 — ABNORMAL HIGH (ref 9–23)
BUN: 21 mg/dL (ref 6–24)
Bilirubin Total: 0.5 mg/dL (ref 0.0–1.2)
CO2: 27 mmol/L (ref 20–29)
Calcium: 9.7 mg/dL (ref 8.7–10.2)
Chloride: 99 mmol/L (ref 96–106)
Creatinine, Ser: 0.54 mg/dL — ABNORMAL LOW (ref 0.57–1.00)
GFR calc Af Amer: 122 mL/min/{1.73_m2} (ref >59)
GFR calc non Af Amer: 106 mL/min/{1.73_m2} (ref >59)
Globulin, Total: 2.9 g/dL (ref 1.5–4.5)
Glucose: 85 mg/dL (ref 65–99)
Potassium: 4.2 mmol/L (ref 3.5–5.2)
Sodium: 137 mmol/L (ref 134–144)
Total Protein: 7.4 g/dL (ref 6.0–8.5)

## 2020-02-26 LAB — LIPID PANEL
Chol/HDL Ratio: 4 ratio (ref 0.0–4.4)
Cholesterol, Total: 205 mg/dL — ABNORMAL HIGH (ref 100–199)
HDL: 51 mg/dL (ref >39)
LDL Chol Calc (NIH): 125 mg/dL — ABNORMAL HIGH (ref 0–99)
Triglycerides: 161 mg/dL — ABNORMAL HIGH (ref 0–149)
VLDL Cholesterol Cal: 29 mg/dL (ref 5–40)

## 2020-02-26 NOTE — Progress Notes (Signed)
Office Visit Note   Patient: Tina Johnson           Date of Birth: [birth year]           MRN: [id number] Visit Date: 02/26/2020              Requested by: No referring provider defined for this encounter. PCP: Ladell Pier, MD   Assessment & Plan: Visit Diagnoses:  1. Acute pain of left knee   2. Toe pain, right     Plan: Impression is approximately 4 weeks out left nondisplaced patella component fracture and right great toe callus.  In regards to the patella, she will limit any flexion of the knee past 90 degrees.  She will let pain be her guide in regards to flexion to 90.  She will follow up with [place] in 4 weeks time for repeat evaluation and 2 view x-rays of the left knee.  In regards to her right great toe callus, she will follow up with Dr. Sharol Given for this to be shaved.  Follow-Up Instructions: Return in about 4 weeks (around 03/25/2020).   Orders:  Orders Placed This Encounter  Procedures  . XR Knee 1-2 Views Left  . XR Toe Great Right   No orders of the defined types were placed in this encounter.     Procedures: No procedures performed   Clinical Data: No additional findings.   Subjective: Chief Complaint  Patient presents with  . Right Knee - Follow-up  . Left Knee - Follow-up    S/p nondisplaced patella fracture    HPI patient is a pleasant 54 year old Spanish-speaking female who is here today with interpreter.  She is little over 4 weeks out left knee fracture of the patella component from previous total knee replacement.  She is doing significantly better.  She has minimal to no pain.  The other issue she brings up today is right great toe pain.  She notes this for the past 4 months or so which really only occurs when she is putting pressure down on the toe.  She notes a small sensation to the bottom of the toe.  Review of Systems as detailed in HPI.  All others reviewed and are negative.   Objective: Vital Signs: Ht 4' 7.5" (1.41 m)   Wt  123 lb (55.8 kg)   BMI 28.08 kg/m   Physical Exam well-developed well-nourished female no acute distress.  Alert and oriented x3.  Ortho Exam left knee exam shows a trace effusion.  Range of motion 0 to 110 degrees.  She has mild tenderness to the distal pole of the patella.  Right great toe exam shows moderate tenderness with evidence of a callus.  No signs of infection or cellulitis.  Specialty Comments:  No specialty comments available.  Imaging: No results found.   PMFS History: Patient Active Problem List   Diagnosis Date Noted  . Essential hypertension 02/25/2020  . Overweight 02/25/2020  . S/P right colectomy 09/26/2017  . Low grade mucinous neoplasm of appendix   . 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 Bradycardia 05/31/2017  . S/P TKR (total knee replacement), bilateral 10/07/2016  . 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 Family history of diabetes mellitus (DM) 01/23/2014  . Right knee DJD 01/23/2014  . Hyperlipidemia 01/23/2014  . HYPERLIPIDEMIA 08/14/2007  . DEPRESSION 08/14/2007  . GERD 08/14/2007  . HEADACHE, CHRONIC 08/14/2007   Past Medical History:  Diagnosis Date  . Abnormality of gait 04/26/2014  . Anxiety    controls with exercise   . Arthritis    knees , shoulders  . Bradycardia 05/31/2017  . DEPRESSION 08/14/2007   Qualifier: Diagnosis of  By: Bertram Gala    . Family history of diabetes mellitus (DM) 01/23/2014  . 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 GERD 08/14/2007   Qualifier: Diagnosis of  By: Bertram Gala    . HEADACHE, CHRONIC 08/14/2007   Qualifier: Diagnosis of  By: Bertram Gala    . HYPERLIPIDEMIA 08/14/2007   Qualifier: Diagnosis of  By: Bertram Gala    . Indigestion    no medicine at present  . 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 Neoplasm of appendix   . OSTEOARTHRITIS 08/14/2007   Qualifier: Diagnosis of  By: Bertram Gala    . Other bilateral secondary osteoarthritis of knee 04/03/2015  . PONV (postoperative nausea and vomiting) 1999   partial hysterectomy in [place]  . Right knee DJD 01/23/2014  . S/P TKR (total knee replacement), bilateral 10/07/2016    Family History  Problem Relation Age of Onset  . Diabetes Sister   . Diabetes Brother   . Diabetes Mother   . Diabetes Sister   . Diabetes Sister   . Diabetes Brother   . Diabetes Brother   . Colon cancer Neg Hx   . Cancer Neg Hx   . Rectal cancer Neg Hx     Past Surgical History:  Procedure Laterality Date  . ABDOMINAL HYSTERECTOMY    . APPENDECTOMY    . JOINT REPLACEMENT Bilateral 2016   [place]  . LAPAROSCOPIC APPENDECTOMY N/A 05/02/2017   Procedure: LAPAROSCOPIC CECECTOMY;  Surgeon: Jules Husbands, MD;  Location: [facility];  Service: General;  Laterality: N/A;  Please open Lap appy tray.  Have gelport and Right hemicolectomy cart in the room.   Marland Kitchen LAPAROSCOPIC RIGHT HEMI COLECTOMY Right 09/26/2017   Procedure: LAPAROSCOPIC RIGHT HEMI COLECTOMY;  Surgeon: Jules Husbands, MD;  Location: [facility];  Service: General;  Laterality: Right;  . TOTAL ABDOMINAL HYSTERECTOMY W/ BILATERAL SALPINGOOPHORECTOMY  2001   done at [facility]  . TOTAL KNEE ARTHROPLASTY Right 04/03/2015   Procedure: RIGHT TOTAL KNEE ARTHROPLASTY;  Surgeon: Leandrew Koyanagi, MD;  Location: [place];  Service: Orthopedics;  Laterality: Right;  . TOTAL KNEE ARTHROPLASTY Left 04/03/2015   Procedure: LEFT TOTAL KNEE ARTHROPLASTY;  Surgeon: Leandrew Koyanagi, MD;  Location: [place];  Service: Orthopedics;  Laterality: Left;  Marland Kitchen VAGINAL DELIVERY     ?x2   Social History   Occupational History  . Not on file  Tobacco Use  . Smoking status: Never Smoker  . Smokeless tobacco: Never Used  Vaping Use  . Vaping Use: Never used  Substance and Sexual Activity  . Alcohol use: No  . Drug use: No  . Sexual activity: Not Currently

## 2020-02-27 ENCOUNTER — Other Ambulatory Visit: Payer: Self-pay | Admitting: Internal Medicine

## 2020-02-27 ENCOUNTER — Telehealth: Payer: Self-pay

## 2020-02-27 DIAGNOSIS — I10 Essential (primary) hypertension: Secondary | ICD-10-CM

## 2020-02-27 DIAGNOSIS — E782 Mixed hyperlipidemia: Secondary | ICD-10-CM

## 2020-02-27 MED ORDER — PRAVASTATIN SODIUM 20 MG PO TABS: 10.0000 mg | ORAL_TABLET | Freq: Every day | ORAL | 6 refills | Status: DC

## 2020-02-27 MED ORDER — AMLODIPINE BESYLATE 5 MG PO TABS: 5.0000 mg | ORAL_TABLET | Freq: Every day | ORAL | 2 refills | Status: DC

## 2020-02-27 NOTE — Telephone Encounter (Signed)
Contacted pt to go over lab results lvm  

## 2020-02-27 NOTE — Telephone Encounter (Signed)
Requested medication (s) are due for refill today: yes  Requested medication (s) are on the active medication list: yes  Last refill:  11/23/2018 #90 2 refills Norvasc and 11/23/2018 #30 6 refills pravachol   Future visit scheduled: yes   Notes to clinic:  expired medications. Patient just seen 02/25/20. Please renew     Requested Prescriptions  Pending Prescriptions Disp Refills   pravastatin (PRAVACHOL) 20 MG tablet 30 tablet 6    Sig: Take 0.5 tablets (10 mg total) by mouth daily.      Cardiovascular:  Antilipid - Statins Failed - 02/27/2020  4:48 PM      Failed - Total Cholesterol in normal range and within 360 days    Cholesterol, Total  Date Value Ref Range Status  02/25/2020 205 (H) 100 - 199 mg/dL Final          Failed - LDL in normal range and within 360 days    LDL Chol Calc (NIH)  Date Value Ref Range Status  02/25/2020 125 (H) 0 - 99 mg/dL Final          Failed - Triglycerides in normal range and within 360 days    Triglycerides  Date Value Ref Range Status  02/25/2020 161 (H) 0 - 149 mg/dL Final          Passed - HDL in normal range and within 360 days    HDL  Date Value Ref Range Status  02/25/2020 51 >39 mg/dL Final          Passed - Patient is not pregnant      Passed - Valid encounter within last 12 months    Recent Outpatient Visits           2 days ago Essential hypertension   [place], Deborah B, MD   1 year ago Essential hypertension   [place], Deborah B, MD   1 year ago Essential hypertension   [place], Deborah B, MD   1 year ago Essential hypertension   [place], Deborah B, MD   1 year ago Essential hypertension   [place], Deborah B, MD       Future Appointments             Tomorrow Newt Minion, MD [facility]   In 3 weeks Leandrew Koyanagi, MD [facility]   In 3 months Ladell Pier, MD [facility]              amLODipine (NORVASC) 5 MG tablet 90 tablet 2    Sig: Take 1 tablet (5 mg total) by mouth daily.      Cardiovascular:  Calcium Channel Blockers Failed - 02/27/2020  4:48 PM      Failed - Last BP in normal range    BP Readings from Last 1 Encounters:  02/25/20 (!) 150/85          Passed - Valid encounter within last 6 months    Recent Outpatient Visits           2 days ago Essential hypertension   [place] Ladell Pier, MD   1 year ago Essential hypertension   [place] Ladell Pier, MD   1 year ago Essential  hypertension   [place] Ladell Pier, MD   1 year ago Essential hypertension   [place], MD   1 year ago Essential hypertension   [place], MD       Future Appointments             Tomorrow Newt Minion, MD [facility]   In 3 weeks Leandrew Koyanagi, MD [facility]   In 3 months Ladell Pier, MD [place]

## 2020-02-27 NOTE — Telephone Encounter (Signed)
Medication Refill - Medication: amLODipine (NORVASC) 5 MG tablet ,pravastatin (PRAVACHOL) 20 MG tablet (Patient was seen a few days ago and was advised her medication was sent to pharmacy however pharmacy does not have medication. Patient would like a callback once medication has been sent over.)   Has the patient contacted their pharmacy? yes (Agent: If no, request that the patient contact the pharmacy for the refill.) (Agent: If yes, when and what did the pharmacy advise?)Contact PCP  Preferred Pharmacy (with phone number or street name):  [place] [place] [address] Phone:  [phone]  Fax:  [phone]       Agent: Please be advised that RX refills may take up to 3 business days. We ask that you follow-up with your pharmacy.

## 2020-02-28 ENCOUNTER — Encounter: Payer: Self-pay | Admitting: Orthopedic Surgery

## 2020-02-28 ENCOUNTER — Ambulatory Visit (INDEPENDENT_AMBULATORY_CARE_PROVIDER_SITE_OTHER): Payer: Self-pay | Admitting: Orthopedic Surgery

## 2020-02-28 ENCOUNTER — Ambulatory Visit: Payer: Self-pay | Admitting: Orthopedic Surgery

## 2020-02-28 VITALS — Ht <= 58 in | Wt 123.0 lb

## 2020-02-28 DIAGNOSIS — M2022 Hallux rigidus, left foot: Secondary | ICD-10-CM

## 2020-02-28 DIAGNOSIS — M2021 Hallux rigidus, right foot: Secondary | ICD-10-CM

## 2020-02-28 MED FILL — AMLODIPINE BESYLATE 5 MG TA: 5 | 30 days supply | Qty: 30 | Fill #0

## 2020-02-28 MED FILL — PRAVASTATIN SODIUM 20 MG TA: 20 | 30 days supply | Qty: 15 | Fill #0

## 2020-02-28 NOTE — Progress Notes (Signed)
Office Visit Note   Patient: Tina Johnson           Date of Birth: 1965/05/06           MRN: [id number] Visit Date: 02/28/2020              Requested by: Ladell Pier, MD [address] [place],  [place] [address] PCP: Ladell Pier, MD  Chief Complaint  Patient presents with  . Right Foot - Pain    Callus GT bilat foot   . Left Foot - Pain      HPI: Patient is a 54 year old woman who presents complaining of callus on the plantar aspect of both great toes worse on the right than the left.  Patient was seen with a interpreter present.  Patient states she has pain beneath the callus pain with weightbearing.  Assessment & Plan: Visit Diagnoses:  1. Hallux rigidus, bilateral     Plan: Recommended a stiff soled walking sneaker to unload pressure from the great toe recommended avoiding flexible shoes recommended not wearing sandals.  Follow-Up Instructions: Return if symptoms worsen or fail to improve.   Ortho Exam  Patient is alert, oriented, no adenopathy, well-dressed, normal affect, normal respiratory effort. Examination patient has a good dorsalis pedis and posterior tibial pulse she has dorsiflexion of the ankle about 20 degrees past neutral.  She has significant hallux rigidus worse on the right than the left with dorsiflexion only about 20 degrees.  She has a callus beneath the great toe worse on the right than the left.  After informed consent the callus was pared she tolerated this well there is no open ulcer no wound no signs of infection.  Imaging: No results found. No images are attached to the encounter.  Labs: Lab Results  Component Value Date   HGBA1C 5.0 01/21/2016   HGBA1C 5.3 01/29/2014   ESRSEDRATE 7 04/03/2015   CRP <0.5 04/03/2015     Lab Results  Component Value Date   ALBUMIN 4.5 02/25/2020   ALBUMIN 4.3 11/29/2018   ALBUMIN 4.2 09/09/2017    No results found for: MG Lab Results  Component Value Date   VD25OH 33.2  11/14/2017   VD25OH 40 01/21/2016   VD25OH 56 01/29/2014    No results found for: PREALBUMIN CBC EXTENDED Latest Ref Rng & Units 02/25/2020 11/29/2018 11/14/2017  WBC 3.4 - 10.8 x10E3/uL 9.0 7.7 -  RBC 3.77 - 5.28 x10E6/uL 4.81 4.53 -  HGB 11.1 - 15.9 g/dL 14.6 14.0 13.5  HCT 34.0 - 46.6 % 44.3 41.9 40.3  PLT 150 - 450 x10E3/uL 227 242 -  NEUTROABS 1.4 - 6.5 K/uL - - -  LYMPHSABS 1.0 - 3.6 K/uL - - -     Body mass index is 28.59 kg/m.  Orders:  No orders of the defined types were placed in this encounter.  No orders of the defined types were placed in this encounter.    Procedures: No procedures performed  Clinical Data: No additional findings.  ROS:  All other systems negative, except as noted in the HPI. Review of Systems  Objective: Vital Signs: Ht 4\' 7"  (1.397 m)   Wt 123 lb (55.8 kg)   BMI 28.59 kg/m   Specialty Comments:  No specialty comments available.  PMFS History: Patient Active Problem List   Diagnosis Date Noted  . Essential hypertension 02/25/2020  . Overweight 02/25/2020  . S/P right colectomy 09/26/2017  . Low grade mucinous neoplasm of appendix   .  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 Bradycardia 05/31/2017  . S/P TKR (total knee replacement), bilateral 10/07/2016  . 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 Family history of diabetes mellitus (DM) 01/23/2014  . Right knee DJD 01/23/2014  . Hyperlipidemia 01/23/2014  . HYPERLIPIDEMIA 08/14/2007  . DEPRESSION 08/14/2007  . GERD 08/14/2007  . HEADACHE, CHRONIC 08/14/2007   Past Medical History:  Diagnosis Date  . Abnormality of gait 04/26/2014  . Anxiety    controls with exercise   . Arthritis    knees , shoulders  . Bradycardia 05/31/2017  . DEPRESSION 08/14/2007   Qualifier: Diagnosis of  By: Bertram Gala    . Family history of diabetes mellitus (DM) 01/23/2014  . Genu varum of both lower extremities 03/07/2014    . GERD 08/14/2007   Qualifier: Diagnosis of  By: Bertram Gala    . HEADACHE, CHRONIC 08/14/2007   Qualifier: Diagnosis of  By: Bertram Gala    . HYPERLIPIDEMIA 08/14/2007   Qualifier: Diagnosis of  By: Bertram Gala    . Indigestion    no medicine at present  . 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 Neoplasm of appendix   . OSTEOARTHRITIS 08/14/2007   Qualifier: Diagnosis of  By: Bertram Gala    . Other bilateral secondary osteoarthritis of knee 04/03/2015  . PONV (postoperative nausea and vomiting) 1999   partial hysterectomy in [place]  . Right knee DJD 01/23/2014  . S/P TKR (total knee replacement), bilateral 10/07/2016    Family History  Problem Relation Age of Onset  . Diabetes Sister   . Diabetes Brother   . Diabetes Mother   . Diabetes Sister   . Diabetes Sister   . Diabetes Brother   . Diabetes Brother   . Colon cancer Neg Hx   . Cancer Neg Hx   . Rectal cancer Neg Hx     Past Surgical History:  Procedure Laterality Date  . ABDOMINAL HYSTERECTOMY    . APPENDECTOMY    . JOINT REPLACEMENT Bilateral 2016   [place]  . LAPAROSCOPIC APPENDECTOMY N/A 05/02/2017   Procedure: LAPAROSCOPIC CECECTOMY;  Surgeon: Jules Husbands, MD;  Location: [facility];  Service: General;  Laterality: N/A;  Please open Lap appy tray.  Have gelport and Right hemicolectomy cart in the room.   Marland Kitchen LAPAROSCOPIC RIGHT HEMI COLECTOMY Right 09/26/2017   Procedure: LAPAROSCOPIC RIGHT HEMI COLECTOMY;  Surgeon: Jules Husbands, MD;  Location: [facility];  Service: General;  Laterality: Right;  . TOTAL ABDOMINAL HYSTERECTOMY W/ BILATERAL SALPINGOOPHORECTOMY  2001   done at [facility]  . TOTAL KNEE ARTHROPLASTY Right 04/03/2015   Procedure: RIGHT TOTAL KNEE ARTHROPLASTY;  Surgeon: Leandrew Koyanagi, MD;  Location: [place];  Service: Orthopedics;  Laterality: Right;  . TOTAL KNEE ARTHROPLASTY Left 04/03/2015   Procedure: LEFT TOTAL KNEE ARTHROPLASTY;  Surgeon: Leandrew Koyanagi, MD;   Location: [place];  Service: Orthopedics;  Laterality: Left;  Marland Kitchen VAGINAL DELIVERY     ?x2   Social History   Occupational History  . Not on file  Tobacco Use  . Smoking status: Never Smoker  . Smokeless tobacco: Never Used  Vaping Use  . Vaping Use: Never used  Substance and Sexual Activity  . Alcohol use: No  . Drug use: No  . Sexual activity: Not Currently

## 2020-02-29 ENCOUNTER — Other Ambulatory Visit: Payer: Self-pay | Admitting: Internal Medicine

## 2020-02-29 DIAGNOSIS — I10 Essential (primary) hypertension: Secondary | ICD-10-CM

## 2020-02-29 DIAGNOSIS — E782 Mixed hyperlipidemia: Secondary | ICD-10-CM

## 2020-02-29 MED ORDER — AMLODIPINE BESYLATE 5 MG PO TABS: 5.0000 mg | ORAL_TABLET | Freq: Every day | ORAL | 2 refills | Status: DC

## 2020-02-29 MED ORDER — PRAVASTATIN SODIUM 20 MG PO TABS: 10.0000 mg | ORAL_TABLET | Freq: Every day | ORAL | 6 refills | Status: DC

## 2020-02-29 NOTE — Telephone Encounter (Signed)
Medication: pravastatin (PRAVACHOL) 20 MG tablet [[id number]], amLODipine (NORVASC) 5 MG tablet [[id number]]- this medication was sent to the wrong pharmacy. Can the medication be sent to the pharmacy below?  Has the patient contacted their pharmacy? YES (Agent: If no, request that the patient contact the pharmacy for the refill.) (Agent: If yes, when and what did the pharmacy advise?)  Preferred Pharmacy (with phone number or street name): [facility]  [place], [place]. [address] [phone]    Agent: Please be advised that RX refills may take up to 3 business days. We ask that you follow-up with your pharmacy.

## 2020-03-01 ENCOUNTER — Ambulatory Visit: Payer: Self-pay | Attending: Internal Medicine

## 2020-03-01 DIAGNOSIS — Z23 Encounter for immunization: Secondary | ICD-10-CM

## 2020-03-01 NOTE — Progress Notes (Signed)
   Covid-19 Vaccination Clinic  Name:  Tina Johnson    MRN: [id number] DOB: [birth year]  03/01/2020  Ms. Tina Johnson was observed post Covid-19 immunization for 15 minutes without incident. She was provided with Vaccine Information Sheet and instruction to access the V-Safe system.   Ms. Tina Johnson was instructed to call 911 with any severe reactions post vaccine: Marland Kitchen Difficulty breathing  . Swelling of face and throat  . A fast heartbeat  . A bad rash all over body  . Dizziness and weakness   Immunizations Administered    Name Date Dose VIS Date Route   Pfizer COVID-19 Vaccine 03/01/2020  2:31 PM 0.3 mL 02/06/2020 Intramuscular   Manufacturer: [employer]   Lot: [id number]   [place]: [address]-7

## 2020-03-03 ENCOUNTER — Ambulatory Visit: Payer: Self-pay | Attending: Internal Medicine

## 2020-03-03 ENCOUNTER — Other Ambulatory Visit: Payer: Self-pay

## 2020-03-25 ENCOUNTER — Other Ambulatory Visit: Payer: Self-pay

## 2020-03-25 ENCOUNTER — Encounter: Payer: Self-pay | Admitting: Orthopaedic Surgery

## 2020-03-25 ENCOUNTER — Ambulatory Visit (INDEPENDENT_AMBULATORY_CARE_PROVIDER_SITE_OTHER): Payer: Self-pay | Admitting: Orthopaedic Surgery

## 2020-03-25 ENCOUNTER — Ambulatory Visit (INDEPENDENT_AMBULATORY_CARE_PROVIDER_SITE_OTHER): Payer: Self-pay

## 2020-03-25 DIAGNOSIS — M25562 Pain in left knee: Secondary | ICD-10-CM

## 2020-03-25 NOTE — Progress Notes (Signed)
Office Visit Note   Patient: Tina Johnson           Date of Birth: 10-13-1965           MRN: [id number] Visit Date: 03/25/2020              Requested by: Ladell Pier, MD [address] [place],  [place] [address] PCP: Ladell Pier, MD   Assessment & Plan: Visit Diagnoses:  1. Acute pain of left knee     Plan: Impression is 8 weeks out left knee nondisplaced vertical patella fracture.  Patient is continuing to do well.  She does have a moderate amount of quadriceps weakness and I think it is in her best interest to start physical therapy.  Have made an internal referral for this.  She will follow up with [place] in 4 weeks time for repeat evaluation and 2 view x-rays of left knee.  Call with concerns or questions.  Follow-Up Instructions: Return in about 4 weeks (around 04/22/2020).   Orders:  Orders Placed This Encounter  Procedures  . XR Knee 1-2 Views Left  . Ambulatory referral to Physical Therapy   No orders of the defined types were placed in this encounter.     Procedures: No procedures performed   Clinical Data: No additional findings.   Subjective: Chief Complaint  Patient presents with  . Right Knee - Pain  . Left Knee - Pain    HPI patient is a pleasant 54 year old Spanish-speaking female who comes in today with an interpreter.  She is about 8 weeks out left vertical nondisplaced patella fracture.  She has been doing well.  She has no pain.     Objective: Vital Signs: There were no vitals taken for this visit.    Ortho Exam left knee exam shows a trace effusion.  No tenderness to the fracture site.  Range of motion 0 to 110 degrees.  She does have moderate quadriceps atrophy and weakness.  She is neurovascular intact distally.  Specialty Comments:  No specialty comments available.  Imaging: XR Knee 1-2 Views Left  Result Date: 03/25/2020 X-rays reveal bony consolidation of the fracture site.    PMFS History: Patient  Active Problem List   Diagnosis Date Noted  . Essential hypertension 02/25/2020  . Overweight 02/25/2020  . S/P right colectomy 09/26/2017  . Low grade mucinous neoplasm of appendix   . 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 Bradycardia 05/31/2017  . S/P TKR (total knee replacement), bilateral 10/07/2016  . 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 Family history of diabetes mellitus (DM) 01/23/2014  . Right knee DJD 01/23/2014  . Hyperlipidemia 01/23/2014  . HYPERLIPIDEMIA 08/14/2007  . DEPRESSION 08/14/2007  . GERD 08/14/2007  . HEADACHE, CHRONIC 08/14/2007   Past Medical History:  Diagnosis Date  . Abnormality of gait 04/26/2014  . Anxiety    controls with exercise   . Arthritis    knees , shoulders  . Bradycardia 05/31/2017  . DEPRESSION 08/14/2007   Qualifier: Diagnosis of  By: Bertram Gala    . Family history of diabetes mellitus (DM) 01/23/2014  . 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 GERD 08/14/2007   Qualifier: Diagnosis of  By: Bertram Gala    . HEADACHE, CHRONIC 08/14/2007   Qualifier: Diagnosis of  By: Bertram Gala    . HYPERLIPIDEMIA 08/14/2007   Qualifier:  Diagnosis of  By: Bertram Gala    . Indigestion    no medicine at present  . 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 Neoplasm of appendix   . OSTEOARTHRITIS 08/14/2007   Qualifier: Diagnosis of  By: Bertram Gala    . Other bilateral secondary osteoarthritis of knee 04/03/2015  . PONV (postoperative nausea and vomiting) 1999   partial hysterectomy in [place]  . Right knee DJD 01/23/2014  . S/P TKR (total knee replacement), bilateral 10/07/2016    Family History  Problem Relation Age of Onset  . Diabetes Sister   . Diabetes Brother   . Diabetes Mother   . Diabetes Sister   . Diabetes Sister   . Diabetes Brother   . Diabetes Brother   . Colon cancer Neg Hx   . Cancer Neg Hx   . Rectal  cancer Neg Hx     Past Surgical History:  Procedure Laterality Date  . ABDOMINAL HYSTERECTOMY    . APPENDECTOMY    . JOINT REPLACEMENT Bilateral 2016   [place]  . LAPAROSCOPIC APPENDECTOMY N/A 05/02/2017   Procedure: LAPAROSCOPIC CECECTOMY;  Surgeon: Jules Husbands, MD;  Location: [facility];  Service: General;  Laterality: N/A;  Please open Lap appy tray.  Have gelport and Right hemicolectomy cart in the room.   Marland Kitchen LAPAROSCOPIC RIGHT HEMI COLECTOMY Right 09/26/2017   Procedure: LAPAROSCOPIC RIGHT HEMI COLECTOMY;  Surgeon: Jules Husbands, MD;  Location: [facility];  Service: General;  Laterality: Right;  . TOTAL ABDOMINAL HYSTERECTOMY W/ BILATERAL SALPINGOOPHORECTOMY  2001   done at [facility]  . TOTAL KNEE ARTHROPLASTY Right 04/03/2015   Procedure: RIGHT TOTAL KNEE ARTHROPLASTY;  Surgeon: Leandrew Koyanagi, MD;  Location: [place];  Service: Orthopedics;  Laterality: Right;  . TOTAL KNEE ARTHROPLASTY Left 04/03/2015   Procedure: LEFT TOTAL KNEE ARTHROPLASTY;  Surgeon: Leandrew Koyanagi, MD;  Location: [place];  Service: Orthopedics;  Laterality: Left;  Marland Kitchen VAGINAL DELIVERY     ?x2   Social History   Occupational History  . Not on file  Tobacco Use  . Smoking status: Never Smoker  . Smokeless tobacco: Never Used  Vaping Use  . Vaping Use: Never used  Substance and Sexual Activity  . Alcohol use: No  . Drug use: No  . Sexual activity: Not Currently

## 2020-04-15 ENCOUNTER — Encounter: Payer: Self-pay | Admitting: Physical Therapy

## 2020-04-15 ENCOUNTER — Ambulatory Visit: Payer: Self-pay | Attending: Physician Assistant | Admitting: Physical Therapy

## 2020-04-15 ENCOUNTER — Other Ambulatory Visit: Payer: Self-pay

## 2020-04-15 DIAGNOSIS — M25562 Pain in left knee: Secondary | ICD-10-CM | POA: Insufficient documentation

## 2020-04-15 DIAGNOSIS — M6281 Muscle weakness (generalized): Secondary | ICD-10-CM | POA: Insufficient documentation

## 2020-04-15 NOTE — Patient Instructions (Signed)
Access Code: [email or web address]: [email or web address]: 12/28/2021Prepared by: Victorino Dike PaaExercises  Set de Cudriceps Supino - 1 x daily - 7 x weekly - 2 sets - 10 reps - 5 hold  Elevaci&oacute;n activa de piernas rectas con serie de cu&aacute;driceps - 1 x daily - 7 x weekly - 2 sets - 10 reps - 5 hold  Extensin de Caderas [place] - 1 x daily - 7 x weekly - 2 sets - 10 reps - 5 hold  Abduccin de Caderas de [place] - 1 x daily - 7 x weekly - 2 sets - 10 reps - 5 hold

## 2020-04-15 NOTE — Therapy (Signed)
[place], [place], [address] Phone: [phone]   Fax:  [phone]  Physical Therapy Evaluation  Patient Details  Name: Tina Johnson MRN: [id number] Date of Birth: Mar 03, 1966 Referring Provider (PT): Dr. Erlinda Hong, Dwana Melena , [place]   Encounter Date: 04/15/2020   PT End of Session - 04/15/20 1121    Visit Number 1    Number of Visits 8    Date for PT Re-Evaluation 06/10/20    Authorization Type CAFA    Authorization Time Period 5/22    PT Start Time 0915    PT Stop Time 1000    PT Time Calculation (min) 45 min    Activity Tolerance Patient tolerated treatment well    Behavior During Therapy [facility] for tasks assessed/performed           Past Medical History:  Diagnosis Date  . Abnormality of gait 04/26/2014  . Anxiety    controls with exercise   . Arthritis    knees , shoulders  . Bradycardia 05/31/2017  . DEPRESSION 08/14/2007   Qualifier: Diagnosis of  By: Bertram Gala    . Family history of diabetes mellitus (DM) 01/23/2014  . 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 GERD 08/14/2007   Qualifier: Diagnosis of  By: Bertram Gala    . HEADACHE, CHRONIC 08/14/2007   Qualifier: Diagnosis of  By: Bertram Gala    . HYPERLIPIDEMIA 08/14/2007   Qualifier: Diagnosis of  By: Bertram Gala    . Indigestion    no medicine at present  . 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 Neoplasm of appendix   . OSTEOARTHRITIS 08/14/2007   Qualifier: Diagnosis of  By: Bertram Gala    . Other bilateral secondary osteoarthritis of knee 04/03/2015  . PONV (postoperative nausea and vomiting) 1999   partial hysterectomy in [place]  . Right knee DJD 01/23/2014  . 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 ABDOMINAL HYSTERECTOMY    . APPENDECTOMY    . JOINT REPLACEMENT Bilateral 2016   [place]  . LAPAROSCOPIC APPENDECTOMY N/A 05/02/2017   Procedure: LAPAROSCOPIC CECECTOMY;  Surgeon: Jules Husbands, MD;  Location: [facility];  Service: General;  Laterality: N/A;  Please open Lap appy tray.  Have gelport and Right hemicolectomy cart in the room.   Marland Kitchen LAPAROSCOPIC RIGHT HEMI COLECTOMY Right 09/26/2017   Procedure: LAPAROSCOPIC RIGHT HEMI COLECTOMY;  Surgeon: Jules Husbands, MD;  Location: [facility];  Service: General;  Laterality: Right;  . TOTAL ABDOMINAL HYSTERECTOMY W/ BILATERAL SALPINGOOPHORECTOMY  2001   done at [facility]  . TOTAL KNEE ARTHROPLASTY Right 04/03/2015   Procedure: RIGHT TOTAL KNEE ARTHROPLASTY;  Surgeon: Leandrew Koyanagi, MD;  Location: [place];  Service: Orthopedics;  Laterality: Right;  . TOTAL KNEE ARTHROPLASTY Left 04/03/2015   Procedure: LEFT TOTAL KNEE ARTHROPLASTY;  Surgeon: Leandrew Koyanagi, MD;  Location: [place];  Service: Orthopedics;  Laterality: Left;  Marland Kitchen VAGINAL DELIVERY     ?x2    There were no vitals filed for this visit.    Subjective Assessment - 04/15/20 0921    Subjective Patient fell about 3 months ago, fractured her patella.  She began to have numbness in L thigh intermittent. She did not need surgery.  She has pain when she wakes up and her knee is bent.  The  only thing she has trouble with is doing stairs.  She has machines for exercise at home and would like to know hat she can do at home.    Patient is accompained by: Interpreter    Pertinent History 2017 bilateral TKA    Limitations Other (comment)   stairs   Diagnostic tests left knee nondisplaced vertical patella fracture.    Patient Stated Goals Exercise    Currently in Pain? No/denies    Pain Score --   not rated   Pain Location Knee    Pain Orientation Left;Anterior;Posterior    Pain Descriptors / Indicators Aching    Pain Type Acute pain    Pain Onset More than a month ago    Pain Frequency Several days a week    Aggravating Factors  bending knee, kneeling , stiff in AM    Pain  Relieving Factors light motion    Effect of Pain on Daily Activities eager to exercise    Multiple Pain Sites No              [facility] PT Assessment - 04/15/20 0001      Assessment   Medical Diagnosis L knee pain, acute, non-displaced patellar fracture    Referring Provider (PT) Dr. Erlinda Hong, Dwana Melena , [place]    Onset Date/Surgical Date 01/21/20    Next MD Visit unknown    Prior Therapy Yes 2017      Precautions   Precautions None      Restrictions   Weight Bearing Restrictions No      Balance Screen   Has the patient fallen in the past 6 months Yes    How many times? 1, Oct. for injury    Has the patient had a decrease in activity level because of a fear of falling?  No    Is the patient reluctant to leave their home because of a fear of falling?  No      Home Ecologist residence    Living Arrangements Alone;Children    Type of [place] to enter    Entrance Stairs-Number of Steps 1    Home Layout One level      Prior Function   Level of Independence Independent    Vocation Unemployed    Vocation Requirements job involved stairs    Leisure exercise, clean my house, meals      Cognition   Overall Cognitive Status Within Functional Limits for tasks assessed      Observation/Other Assessments   Focus on Therapeutic Outcomes (FOTO)  NT      Sensation   Light Touch Impaired by gross assessment    Additional Comments occ. has numbness in thigh      Squat   Comments decreased Weightbearing on LLE      Step Up   Comments poor knee control Lt LE      Step Down   Comments unable to do without UE assist      Other:   Other/ Comments SLS      Posture/Postural Control   Posture Comments walks with toe in bilaterally      AROM   Right Knee Extension 0    Right Knee Flexion 105    Left Knee Extension 0    Left Knee Flexion 101   pain     Strength   Right Hip Flexion 4-/5    Right Hip Extension 3+/5  Right  Hip ABduction 4-/5    Left Hip Flexion 3+/5    Left Hip Extension 3+/5    Left Hip ABduction 4-/5    Right Knee Flexion 4/5    Right Knee Extension 4+/5    Left Knee Flexion 4-/5    Left Knee Extension 3+/5      Palpation   Patella mobility pain inferior patella, normal mobility    Palpation comment atrophy Lt quad      Transfers   Five time sit to stand comments  15 sec            Objective measurements completed on examination: See above findings.       PT Education - 04/15/20 1120    Education Details PT/POC, HEP, exercise machines, avoid stair stepper    Person(s) Educated Patient    Methods Explanation;Handout    Comprehension Verbalized understanding;Returned demonstration            PT Short Term Goals - 04/15/20 1122      PT SHORT TERM GOAL #1   Title independent with HEP for knees and hips (L)    Time 4    Period Weeks    Status New    Target Date 05/13/20      PT SHORT TERM GOAL #2   Title Pt will bend knee to 100 deg and have no increased patellar pain    Time 4    Period Weeks    Status New    Target Date 05/13/20      PT SHORT TERM GOAL #3   Title Pt will increase symmetry of squats to demo less compensation, fear    Time 4    Period Weeks    Status New    Target Date 05/13/20             PT Long Term Goals - 04/15/20 1125      PT LONG TERM GOAL #1   Title Pt will be I with HEP for knee, hip.    Time 8    Period Weeks    Status New    Target Date 06/10/20      PT LONG TERM GOAL #2   Title Patient will be able to exercise at [facility] with no increase in knee pain    Time 8    Period Weeks    Status New    Target Date 06/10/20      PT LONG TERM GOAL #3   Title Pt will increase L (and R)  quad strength to 4+/5 or more to equalize functional strength in LEs    Time 8    Period Weeks    Status New    Target Date 06/10/20      PT LONG TERM GOAL #4   Title Pt will demonstrate 10 reps SLR on LLE without extensor lag  for improved strength    Time 8    Period Weeks    Status New    Target Date 06/10/20                  Plan - 04/15/20 1128    Clinical Impression Statement Patient presents with Lt knee pain, weakness from a patellar fracture that occurred with a fall in Oct 2021. Her pain is quite minimal and she self limits activities as needed.  She has min hip weakness but significant L quad atrophy which impact her stability with gait and exercise.  She has premorbid  knee stiffness but functional.  I question how strong her quads were prior ot this fall.  Will see if she can gain some strength, she should be able to restore a more functional LE movement pattern.    Personal Factors and Comorbidities Comorbidity 1    Comorbidities bilat. TKA    Examination-Activity Limitations Stairs;Locomotion Level;Squat    Examination-Participation Restrictions Community Activity    Stability/Clinical Decision Making Stable/Uncomplicated    Clinical Decision Making Low    Rehab Potential Excellent    PT Frequency 1x / week    PT Duration 8 weeks    PT Treatment/Interventions ADLs/Self Care Home Management;Therapeutic exercise;Balance training;Functional mobility training;Therapeutic activities;Cryotherapy;Passive range of motion;Moist Heat;Patient/family education;Manual techniques;Gait training;Taping    PT Next Visit Plan check HEP, quad, hip ext/abd strength. Bike vs elliptical    PT Home Exercise Plan 3 way hip, quad set. [id number]    Consulted and Agree with Plan of Care Patient           Patient will benefit from skilled therapeutic intervention in order to improve the following deficits and impairments:  Impaired flexibility,Postural dysfunction,Pain,Decreased strength,Decreased range of motion,Increased edema,Difficulty walking,Decreased balance,Decreased mobility,Decreased endurance  Visit Diagnosis: Muscle weakness (generalized)  Acute pain of left knee     Problem List Patient Active  Problem List   Diagnosis Date Noted  . Essential hypertension 02/25/2020  . Overweight 02/25/2020  . S/P right colectomy 09/26/2017  . Low grade mucinous neoplasm of appendix   . 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 Bradycardia 05/31/2017  . S/P TKR (total knee replacement), bilateral 10/07/2016  . 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 Family history of diabetes mellitus (DM) 01/23/2014  . Right knee DJD 01/23/2014  . Hyperlipidemia 01/23/2014  . HYPERLIPIDEMIA 08/14/2007  . DEPRESSION 08/14/2007  . GERD 08/14/2007  . HEADACHE, CHRONIC 08/14/2007    Kambrey Hagger 04/15/2020, 5:00 PM  [facility] [address] [place], [place], [address] Phone: [phone]   Fax:  [phone]  Name: Tina Johnson MRN: [id number] Date of Birth: 04/09/1966   Raeford Razor, PT 04/15/20 5:00 PM Phone: [phone] Fax: [phone]

## 2020-04-19 ENCOUNTER — Emergency Department (HOSPITAL_COMMUNITY): Admission: EM | Admit: 2020-04-19 | Discharge: 2020-04-19 | Discharge status: Absent without leave | Payer: Self-pay

## 2020-04-19 ENCOUNTER — Emergency Department (HOSPITAL_COMMUNITY)
Admission: EM | Admit: 2020-04-19 | Discharge: 2020-04-19 | Disposition: A | Discharge status: Home / Self Care | Payer: Self-pay | Attending: Emergency Medicine | Admitting: Emergency Medicine

## 2020-04-19 ENCOUNTER — Encounter (HOSPITAL_COMMUNITY): Payer: Self-pay | Admitting: Emergency Medicine

## 2020-04-19 ENCOUNTER — Emergency Department (HOSPITAL_COMMUNITY): Payer: Self-pay

## 2020-04-19 ENCOUNTER — Other Ambulatory Visit: Payer: Self-pay

## 2020-04-19 DIAGNOSIS — R002 Palpitations: Secondary | ICD-10-CM | POA: Insufficient documentation

## 2020-04-19 DIAGNOSIS — Z91040 Latex allergy status: Secondary | ICD-10-CM | POA: Insufficient documentation

## 2020-04-19 DIAGNOSIS — Z79899 Other long term (current) drug therapy: Secondary | ICD-10-CM | POA: Insufficient documentation

## 2020-04-19 DIAGNOSIS — R198 Other specified symptoms and signs involving the digestive system and abdomen: Secondary | ICD-10-CM

## 2020-04-19 DIAGNOSIS — Z96652 Presence of left artificial knee joint: Secondary | ICD-10-CM | POA: Insufficient documentation

## 2020-04-19 DIAGNOSIS — R0989 Other specified symptoms and signs involving the circulatory and respiratory systems: Secondary | ICD-10-CM | POA: Insufficient documentation

## 2020-04-19 DIAGNOSIS — I10 Essential (primary) hypertension: Secondary | ICD-10-CM | POA: Insufficient documentation

## 2020-04-19 LAB — I-STAT BETA HCG BLOOD, ED (MC, WL, AP ONLY): I-stat hCG, quantitative: <5 m[IU]/mL (ref <5)

## 2020-04-19 LAB — TROPONIN I (HIGH SENSITIVITY)
Troponin I (High Sensitivity): 4 ng/L (ref <18)
Troponin I (High Sensitivity): 7 ng/L (ref <18)

## 2020-04-19 LAB — CBC
HCT: 42 % (ref 36.0–46.0)
Hemoglobin: 14.1 g/dL (ref 12.0–15.0)
MCH: 31.2 pg (ref 26.0–34.0)
MCHC: 33.6 g/dL (ref 30.0–36.0)
MCV: 92.9 fL (ref 80.0–100.0)
Platelets: 242 10*3/uL (ref 150–400)
RBC: 4.52 MIL/uL (ref 3.87–5.11)
RDW: 13.5 % (ref 11.5–15.5)
WBC: 7.5 10*3/uL (ref 4.0–10.5)
nRBC: 0 % (ref 0.0–0.2)

## 2020-04-19 LAB — BASIC METABOLIC PANEL
Anion gap: 10 (ref 5–15)
BUN: 19 mg/dL (ref 6–20)
CO2: 27 mmol/L (ref 22–32)
Calcium: 9.3 mg/dL (ref 8.9–10.3)
Chloride: 106 mmol/L (ref 98–111)
Creatinine, Ser: 0.56 mg/dL (ref 0.44–1.00)
GFR, Estimated: >60 mL/min (ref >60)
Glucose, Bld: 144 mg/dL — ABNORMAL HIGH (ref 70–99)
Potassium: 3.6 mmol/L (ref 3.5–5.1)
Sodium: 143 mmol/L (ref 135–145)

## 2020-04-19 NOTE — ED Triage Notes (Signed)
PT arrives via EMS from home with chest palpitations that woke her from a sleep. VSS in transport. No cardiac hx. Pt reported out of blood pressure meds for the last week.

## 2020-04-19 NOTE — ED Provider Notes (Signed)
[facility] EMERGENCY DEPARTMENT Provider Note   CSN: [id number] Arrival date & time: 04/19/20  1962     History Chief Complaint  Patient presents with  . Palpitations    Jeremy Mclamb is a 55 y.o. female.  The history is provided by the patient and medical records. A language interpreter was used.  Palpitations  Josalin Carneiro is a 55 y.o. female who presents to the Emergency Department complaining of palpitations.  She presents to the ED complaining of palpitations that began last night. She woke up from sleep and felt like a spot in the front of her neck had a very fast pulse. Symptoms lasted for about five minutes. She did eat [place] food last night. She has had two prior similar episodes, but she did not have [place] food before those episodes occurred. No difficulty breathing or swallowing. Denies any fevers, chest pain, shortness of breath, nausea, vomiting, abdominal pain. She is asymptomatic at the time of ED assessment.   Hx/o HTN, HPL    Past Medical History:  Diagnosis Date  . Abnormality of gait 04/26/2014  . Anxiety    controls with exercise   . Arthritis    knees , shoulders  . Bradycardia 05/31/2017  . DEPRESSION 08/14/2007   Qualifier: Diagnosis of  By: Davonna Belling    . Family history of diabetes mellitus (DM) 01/23/2014  . 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 GERD 08/14/2007   Qualifier: Diagnosis of  By: Davonna Belling    . HEADACHE, CHRONIC 08/14/2007   Qualifier: Diagnosis of  By: Davonna Belling    . HYPERLIPIDEMIA 08/14/2007   Qualifier: Diagnosis of  By: Davonna Belling    . Indigestion    no medicine at present  . 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 Neoplasm of appendix   . OSTEOARTHRITIS 08/14/2007   Qualifier: Diagnosis of  By: Davonna Belling    . Other bilateral secondary osteoarthritis of knee 04/03/2015  . PONV (postoperative nausea and vomiting) 1999    partial hysterectomy in [place]  . Right knee DJD 01/23/2014  . S/P TKR (total knee replacement), bilateral 10/07/2016    Patient Active Problem List   Diagnosis Date Noted  . Essential hypertension 02/25/2020  . Overweight 02/25/2020  . S/P right colectomy 09/26/2017  . Low grade mucinous neoplasm of appendix   . 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  . Bradycardia 05/31/2017  . S/P TKR (total knee replacement), bilateral 10/07/2016  . 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 Family history of diabetes mellitus (DM) 01/23/2014  . Right knee DJD 01/23/2014  . Hyperlipidemia 01/23/2014  . HYPERLIPIDEMIA 08/14/2007  . DEPRESSION 08/14/2007  . GERD 08/14/2007  . HEADACHE, CHRONIC 08/14/2007    Past Surgical History:  Procedure Laterality Date  . ABDOMINAL HYSTERECTOMY    . APPENDECTOMY    . JOINT REPLACEMENT Bilateral 2016   [place]  . LAPAROSCOPIC APPENDECTOMY N/A 05/02/2017   Procedure: LAPAROSCOPIC CECECTOMY;  Surgeon: Leafy Ro, MD;  Location: [facility] OR[facility];  Service: General;  Laterality: N/A;  Please open Lap appy tray.  Have gelport and Right hemicolectomy cart in the room.   Marland Kitchen LAPAROSCOPIC RIGHT HEMI COLECTOMY Right 09/26/2017   Procedure: LAPAROSCOPIC RIGHT HEMI COLECTOMY;  Surgeon: Leafy Ro, MD;  Location: [facility];  Service: General;  Laterality: Right;  . TOTAL ABDOMINAL HYSTERECTOMY W/ BILATERAL SALPINGOOPHORECTOMY  2001   done at [facility]  . TOTAL KNEE ARTHROPLASTY Right 04/03/2015   Procedure: RIGHT TOTAL KNEE ARTHROPLASTY;  Surgeon: Tarry Kos, MD;  [facility];  Service: Orthopedics;  Laterality: Right;  . TOTAL KNEE ARTHROPLASTY Left 04/03/2015   Procedure: LEFT TOTAL KNEE ARTHROPLASTY;  Surgeon: Tarry Kos, MD;  [facility];  Service: Orthopedics;  Laterality: Left;  Marland Kitchen VAGINAL DELIVERY     ?x2     OB History   No obstetric history on file.     Family History  Problem  Relation Age of Onset  . Diabetes Sister   . Diabetes Brother   . Diabetes Mother   . Diabetes Sister   . Diabetes Sister   . Diabetes Brother   . Diabetes Brother   . Colon cancer Neg Hx   . Cancer Neg Hx   . Rectal cancer Neg Hx     Social History   Tobacco Use  . Smoking status: Never Smoker  . Smokeless tobacco: Never Used  Vaping Use  . Vaping Use: Never used  Substance Use Topics  . Alcohol use: No  . Drug use: No    Home Medications Prior to Admission medications   Medication Sig Start Date End Date Taking? Authorizing Provider  amLODipine (NORVASC) 5 MG tablet Take 1 tablet (5 mg total) by mouth daily. 02/29/20   Marcine Matar, MD  CALCIUM PO Take 1 tablet by mouth 2 (two) times daily.     [provider]  Cholecalciferol (VITAMIN D3) 2000 units TABS Take 2,000 Units by mouth daily.     [provider]  conjugated estrogens (PREMARIN) vaginal cream Place 1 g vaginally 2 (two) times a week. Monday & Thursday    [provider]  Multiple Vitamin (MULTIVITAMIN WITH MINERALS) TABS tablet Take 1 tablet by mouth 2 (two) times daily.    [provider]  OMEGA-3 FATTY ACIDS PO Take 1 capsule by mouth 2 (two) times daily.     [provider]  pravastatin (PRAVACHOL) 20 MG tablet Take 0.5 tablets (10 mg total) by mouth daily. Patient not taking: Reported on 04/15/2020 02/29/20   Marcine Matar, MD    Allergies    Norco [hydrocodone-acetaminophen] and Latex  Review of Systems   Review of Systems  Cardiovascular: Positive for palpitations.  All other systems reviewed and are negative.   Physical Exam Updated Vital Signs BP 134/76 (BP Location: Right Arm)   Pulse (!) 51   Temp 98 F (36.7 C) (Oral)   Resp 19   SpO2 99%   Physical Exam Vitals and nursing note reviewed.  Constitutional:      Appearance: She is well-developed and well-nourished.  HENT:     Head: Normocephalic and atraumatic.  Cardiovascular:      Rate and Rhythm: Normal rate and regular rhythm.     Heart sounds: No murmur heard.   Pulmonary:     Effort: Pulmonary effort is normal. No respiratory distress.     Breath sounds: Normal breath sounds.  Abdominal:     Palpations: Abdomen is soft.     Tenderness: There is no abdominal tenderness. There is no guarding or rebound.  Musculoskeletal:        General: No swelling, tenderness or edema.  Skin:    General: Skin is warm and dry.  Neurological:     Mental Status: She is alert and oriented to person, place, and time.  Psychiatric:  Mood and Affect: Mood and affect normal.        Behavior: Behavior normal.     ED Results / Procedures / Treatments   Labs (all labs ordered are listed, but only abnormal results are displayed) Labs Reviewed  BASIC METABOLIC PANEL - Abnormal; Notable for the following components:      Result Value   Glucose, Bld 144 (*)    All other components within normal limits  CBC  I-STAT BETA HCG BLOOD, ED ([facility], [facility], [facility] ONLY)  TROPONIN I (HIGH SENSITIVITY)  TROPONIN I (HIGH SENSITIVITY)    EKG None  ED ECG REPORT   Date: 04/19/2020  Rate: 52  Rhythm: sinus bradycardia  QRS Axis: normal  Intervals: normal  ST/T Wave abnormalities: nonspecific ST changes  Conduction Disutrbances:none  Narrative Interpretation:   Old EKG Reviewed: none available  I have personally reviewed the EKG tracing and agree with the computerized printout as noted.  Radiology DG Chest 2 View  Result Date: 04/19/2020 CLINICAL DATA:  Chest pain EXAM: CHEST - 2 VIEW COMPARISON:  None. FINDINGS: The heart size and mediastinal contours are within normal limits. Both lungs are clear. The visualized skeletal structures are unremarkable. IMPRESSION: No active cardiopulmonary disease. Electronically Signed   By: Prudencio Pair M.D.   On: 04/19/2020 02:59    Procedures Procedures (including critical care time)  Medications Ordered in ED Medications - No data to  display  ED Course  I have reviewed the triage vital signs and the nursing notes.  Pertinent labs & imaging results that were available during my care of the patient were reviewed by me and considered in my medical decision making (see chart for details).    MDM Rules/Calculators/A&P                         patient here for evaluation of a rapid heart rate sensation in the front of her neck, asymptomatic on ED assessment. EKG with sinus bradycardia. Labs without acute abnormality discussed with patient unclear source of her symptoms. Recommend that she follow up with her PCP for further evaluation, may benefit from Holter monitor. She may also be experiencing a global sensation in her throat, possible anxiety. Discussed she may benefit from outpatient counseling. She does have a lot of anxiety.  Presentation is not consistent with PE, angioedema, ACS, CHF, pneumonia.  Final Clinical Impression(s) / ED Diagnoses Final diagnoses:  Palpitations  Globus sensation    Rx / DC Orders ED Discharge Orders    None       Quintella Reichert, MD 04/19/20 [phone]

## 2020-04-22 ENCOUNTER — Ambulatory Visit (INDEPENDENT_AMBULATORY_CARE_PROVIDER_SITE_OTHER): Payer: Self-pay | Admitting: Orthopaedic Surgery

## 2020-04-22 ENCOUNTER — Other Ambulatory Visit: Payer: Self-pay

## 2020-04-22 ENCOUNTER — Encounter: Payer: Self-pay | Admitting: Orthopaedic Surgery

## 2020-04-22 ENCOUNTER — Ambulatory Visit (INDEPENDENT_AMBULATORY_CARE_PROVIDER_SITE_OTHER): Payer: Self-pay

## 2020-04-22 DIAGNOSIS — M25562 Pain in left knee: Secondary | ICD-10-CM

## 2020-04-22 NOTE — Progress Notes (Signed)
Office Visit Note   Patient: Tina Johnson           Date of Birth: 10-03-1965           MRN: [id number] Visit Date: 04/22/2020              Requested by: Ladell Pier, MD [address] [place],  [place] [address] PCP: Ladell Pier, MD   Assessment & Plan: Visit Diagnoses:  1. Acute pain of left knee     Plan: From the fracture standpoint this has healed but she does not need physical therapy to strengthen her quadriceps.  She does not need to follow-up with [place] unless she has a history of.  Questions encouraged and answered.  Follow-up as needed.  Follow-Up Instructions: Return if symptoms worsen or fail to improve.   Orders:  Orders Placed This Encounter  Procedures  . XR Knee 1-2 Views Left   No orders of the defined types were placed in this encounter.     Procedures: No procedures performed   Clinical Data: No additional findings.   Subjective: Chief Complaint  Patient presents with  . Left Knee - Pain, Follow-up, Fracture    Patient returns today for follow-up of patella fracture.  She is doing well and has no pain.  She recently started physical therapy.   Review of Systems   Objective: Vital Signs: There were no vitals taken for this visit.  Physical Exam  Ortho Exam Left knee shows no tenderness palpation or swelling.  Good range of motion.  She still lacks some quadricep strength. Specialty Comments:  No specialty comments available.  Imaging: XR Knee 1-2 Views Left  Result Date: 04/22/2020 Healed patella fracture.  Knee replacement is stable.    PMFS History: Patient Active Problem List   Diagnosis Date Noted  . Essential hypertension 02/25/2020  . Overweight 02/25/2020  . S/P right colectomy 09/26/2017  . Low grade mucinous neoplasm of appendix   . 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 Bradycardia 05/31/2017  . S/P TKR (total knee replacement), bilateral 10/07/2016  . Left knee DJD  03/07/2014  . 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 Family history of diabetes mellitus (DM) 01/23/2014  . Right knee DJD 01/23/2014  . Hyperlipidemia 01/23/2014  . HYPERLIPIDEMIA 08/14/2007  . DEPRESSION 08/14/2007  . GERD 08/14/2007  . HEADACHE, CHRONIC 08/14/2007   Past Medical History:  Diagnosis Date  . Abnormality of gait 04/26/2014  . Anxiety    controls with exercise   . Arthritis    knees , shoulders  . Bradycardia 05/31/2017  . DEPRESSION 08/14/2007   Qualifier: Diagnosis of  By: Bertram Gala    . Family history of diabetes mellitus (DM) 01/23/2014  . 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 GERD 08/14/2007   Qualifier: Diagnosis of  By: Bertram Gala    . HEADACHE, CHRONIC 08/14/2007   Qualifier: Diagnosis of  By: Bertram Gala    . HYPERLIPIDEMIA 08/14/2007   Qualifier: Diagnosis of  By: Bertram Gala    . Indigestion    no medicine at present  . 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 Neoplasm of appendix   . OSTEOARTHRITIS 08/14/2007   Qualifier: Diagnosis of  By: Bertram Gala    . Other bilateral secondary osteoarthritis of knee 04/03/2015  . PONV (postoperative nausea and vomiting) 1999   partial hysterectomy in [place]  .  Right knee DJD 01/23/2014  . S/P TKR (total knee replacement), bilateral 10/07/2016    Family History  Problem Relation Age of Onset  . Diabetes Sister   . Diabetes Brother   . Diabetes Mother   . Diabetes Sister   . Diabetes Sister   . Diabetes Brother   . Diabetes Brother   . Colon cancer Neg Hx   . Cancer Neg Hx   . Rectal cancer Neg Hx     Past Surgical History:  Procedure Laterality Date  . ABDOMINAL HYSTERECTOMY    . APPENDECTOMY    . JOINT REPLACEMENT Bilateral 2016   [place]  . LAPAROSCOPIC APPENDECTOMY N/A 05/02/2017   Procedure: LAPAROSCOPIC CECECTOMY;  Surgeon: Leafy Ro, MD;  Location: [facility] OR[facility];  Service: General;  Laterality: N/A;  Please open Lap  appy tray.  Have gelport and Right hemicolectomy cart in the room.   Marland Kitchen LAPAROSCOPIC RIGHT HEMI COLECTOMY Right 09/26/2017   Procedure: LAPAROSCOPIC RIGHT HEMI COLECTOMY;  Surgeon: Leafy Ro, MD;  Location: [facility];  Service: General;  Laterality: Right;  . TOTAL ABDOMINAL HYSTERECTOMY W/ BILATERAL SALPINGOOPHORECTOMY  2001   done at [facility]  . TOTAL KNEE ARTHROPLASTY Right 04/03/2015   Procedure: RIGHT TOTAL KNEE ARTHROPLASTY;  Surgeon: Tarry Kos, MD;  [facility];  Service: Orthopedics;  Laterality: Right;  . TOTAL KNEE ARTHROPLASTY Left 04/03/2015   Procedure: LEFT TOTAL KNEE ARTHROPLASTY;  Surgeon: Tarry Kos, MD;  Location[facility];  Service: Orthopedics;  Laterality: Left;  Marland Kitchen VAGINAL DELIVERY     ?x2   Social History   Occupational History  . Not on file  Tobacco Use  . Smoking status: Never Smoker  . Smokeless tobacco: Never Used  Vaping Use  . Vaping Use: Never used  Substance and Sexual Activity  . Alcohol use: No  . Drug use: No  . Sexual activity: Not Currently

## 2020-05-02 ENCOUNTER — Encounter: Payer: Self-pay | Admitting: Physical Therapy

## 2020-05-02 ENCOUNTER — Other Ambulatory Visit: Payer: Self-pay

## 2020-05-02 ENCOUNTER — Ambulatory Visit: Payer: 59 | Attending: Physician Assistant | Admitting: Physical Therapy

## 2020-05-02 DIAGNOSIS — M25562 Pain in left knee: Secondary | ICD-10-CM | POA: Diagnosis present

## 2020-05-02 DIAGNOSIS — M6281 Muscle weakness (generalized): Secondary | ICD-10-CM | POA: Insufficient documentation

## 2020-05-02 NOTE — Therapy (Signed)
[place], [place], [address] Phone: [phone]   Fax:  [phone]  Physical Therapy Treatment  Patient Details  Name: Tina Johnson MRN: [id number] Date of Birth: 17-Mar-1966 Referring Provider (PT): Dr. Erlinda Hong, Dwana Melena , [place]   Encounter Date: 05/02/2020   PT End of Session - 05/02/20 0853    Visit Number 2    Number of Visits 8    Date for PT Re-Evaluation 06/10/20    Authorization Type CAFA    PT Start Time 0845    PT Stop Time 0923    PT Time Calculation (min) 38 min           Past Medical History:  Diagnosis Date  . Abnormality of gait 04/26/2014  . Anxiety    controls with exercise   . Arthritis    knees , shoulders  . Bradycardia 05/31/2017  . DEPRESSION 08/14/2007   Qualifier: Diagnosis of  By: Bertram Gala    . Family history of diabetes mellitus (DM) 01/23/2014  . 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 GERD 08/14/2007   Qualifier: Diagnosis of  By: Bertram Gala    . HEADACHE, CHRONIC 08/14/2007   Qualifier: Diagnosis of  By: Bertram Gala    . HYPERLIPIDEMIA 08/14/2007   Qualifier: Diagnosis of  By: Bertram Gala    . Indigestion    no medicine at present  . 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 Neoplasm of appendix   . OSTEOARTHRITIS 08/14/2007   Qualifier: Diagnosis of  By: Bertram Gala    . Other bilateral secondary osteoarthritis of knee 04/03/2015  . PONV (postoperative nausea and vomiting) 1999   partial hysterectomy in [place]  . Right knee DJD 01/23/2014  . 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 ABDOMINAL HYSTERECTOMY    . APPENDECTOMY    . JOINT REPLACEMENT Bilateral 2016   [place]  . LAPAROSCOPIC APPENDECTOMY N/A 05/02/2017   Procedure: LAPAROSCOPIC CECECTOMY;  Surgeon: Jules Husbands, MD;  Location: [facility];  Service: General;   Laterality: N/A;  Please open Lap appy tray.  Have gelport and Right hemicolectomy cart in the room.   Marland Kitchen LAPAROSCOPIC RIGHT HEMI COLECTOMY Right 09/26/2017   Procedure: LAPAROSCOPIC RIGHT HEMI COLECTOMY;  Surgeon: Jules Husbands, MD;  Location: [facility];  Service: General;  Laterality: Right;  . TOTAL ABDOMINAL HYSTERECTOMY W/ BILATERAL SALPINGOOPHORECTOMY  2001   done at [facility]  . TOTAL KNEE ARTHROPLASTY Right 04/03/2015   Procedure: RIGHT TOTAL KNEE ARTHROPLASTY;  Surgeon: Leandrew Koyanagi, MD;  Location: [place];  Service: Orthopedics;  Laterality: Right;  . TOTAL KNEE ARTHROPLASTY Left 04/03/2015   Procedure: LEFT TOTAL KNEE ARTHROPLASTY;  Surgeon: Leandrew Koyanagi, MD;  Location: [place];  Service: Orthopedics;  Laterality: Left;  Marland Kitchen VAGINAL DELIVERY     ?x2    There were no vitals filed for this visit.   Subjective Assessment - 05/02/20 0852    Subjective No pain. The first week was hard and I was in pain but now I exercise for 45 minutes a day without Pain.    Currently in Pain? No/denies                             [facility] Adult PT Treatment/Exercise - 05/02/20 0001  Exercises   Exercises Knee/Hip      Knee/Hip Exercises: Stretches   Active Hamstring Stretch 3 reps;30 seconds    Active Hamstring Stretch Limitations EOM    Hip Flexor Stretch 3 reps;30 seconds    Hip Flexor Stretch Limitations modified off edge of mat using 4 inch step      Knee/Hip Exercises: Aerobic   Recumbent Bike L3 x 5 minutes      Knee/Hip Exercises: Standing   Forward Step Up Step Height: 4";10 reps      Knee/Hip Exercises: Seated   Long Arc Quad Limitations x 20    Sit to [employer] 10 reps;2 sets   standing on 4 inch step in front on mat     Knee/Hip Exercises: Supine   Quad Sets 10 reps    Short Arc [employer] 20 reps    Bridges 10 reps    Bridges Limitations 2 sets    Straight Leg Raises 10 reps      Knee/Hip Exercises: Sidelying   Hip ABduction 20 reps      Knee/Hip  Exercises: Prone   Hamstring Curl 20 reps    Hip Extension 20 reps                    PT Short Term Goals - 04/15/20 1122      PT SHORT TERM GOAL #1   Title independent with HEP for knees and hips (L)    Time 4    Period Weeks    Status New    Target Date 05/13/20      PT SHORT TERM GOAL #2   Title Pt will bend knee to 100 deg and have no increased patellar pain    Time 4    Period Weeks    Status New    Target Date 05/13/20      PT SHORT TERM GOAL #3   Title Pt will increase symmetry of squats to demo less compensation, fear    Time 4    Period Weeks    Status New    Target Date 05/13/20             PT Long Term Goals - 04/15/20 1125      PT LONG TERM GOAL #1   Title Pt will be I with HEP for knee, hip.    Time 8    Period Weeks    Status New    Target Date 06/10/20      PT LONG TERM GOAL #2   Title Patient will be able to exercise at [facility] with no increase in knee pain    Time 8    Period Weeks    Status New    Target Date 06/10/20      PT LONG TERM GOAL #3   Title Pt will increase L (and R)  quad strength to 4+/5 or more to equalize functional strength in LEs    Time 8    Period Weeks    Status New    Target Date 06/10/20      PT LONG TERM GOAL #4   Title Pt will demonstrate 10 reps SLR on LLE without extensor lag for improved strength    Time 8    Period Weeks    Status New    Target Date 06/10/20                 Plan - 05/02/20 [id number]    Clinical Impression Statement  Pt reports pain is much improved and she is compliant with HEP. Reviewed HEP and progressed with quad strength and knee stretches. She demonstrates poor motor control with LAQ and SAQ.  Began sit-stands with good tolerance,utilized step to decrease the seat height. Pt requested updated HEP.    PT Next Visit Plan check HEP, quad, hip ext/abd strength. Bike vs elliptical    PT Home Exercise Plan 3 way hip, quad set. [id number], added LAQ, sit to stand, quad and  hamstring stretch           Patient will benefit from skilled therapeutic intervention in order to improve the following deficits and impairments:  Impaired flexibility,Postural dysfunction,Pain,Decreased strength,Decreased range of motion,Increased edema,Difficulty walking,Decreased balance,Decreased mobility,Decreased endurance  Visit Diagnosis: Muscle weakness (generalized)  Acute pain of left knee     Problem List Patient Active Problem List   Diagnosis Date Noted  . Essential hypertension 02/25/2020  . Overweight 02/25/2020  . S/P right colectomy 09/26/2017  . Low grade mucinous neoplasm of appendix   . 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 Bradycardia 05/31/2017  . S/P TKR (total knee replacement), bilateral 10/07/2016  . 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 Family history of diabetes mellitus (DM) 01/23/2014  . Right knee DJD 01/23/2014  . Hyperlipidemia 01/23/2014  . HYPERLIPIDEMIA 08/14/2007  . DEPRESSION 08/14/2007  . GERD 08/14/2007  . HEADACHE, CHRONIC 08/14/2007    Dorene Ar, PTA 05/02/2020, 9:40 AM  [place], [place], [address] Phone: [phone]   Fax:  [phone]  Name: Tina Johnson MRN: [id number] Date of Birth: 08-Apr-1966

## 2020-05-06 ENCOUNTER — Other Ambulatory Visit: Payer: Self-pay

## 2020-05-06 ENCOUNTER — Ambulatory Visit: Payer: 59

## 2020-05-06 DIAGNOSIS — M25562 Pain in left knee: Secondary | ICD-10-CM

## 2020-05-06 DIAGNOSIS — M6281 Muscle weakness (generalized): Secondary | ICD-10-CM | POA: Diagnosis not present

## 2020-05-06 NOTE — Therapy (Addendum)
[place], [place], [address] Phone: [phone]   Fax:  [phone]  Physical Therapy Treatment  Patient Details  Name: Tina Johnson MRN: [id number] Date of Birth: Aug 17, 1965 Referring Provider (PT): Dr. Erlinda Hong, Dwana Melena , [place]   Encounter Date: 05/06/2020   PT End of Session - 05/06/20 1051    Visit Number 3    Number of Visits 8    Date for PT Re-Evaluation 06/10/20    Authorization Type CAFA    PT Start Time 1051    PT Stop Time 1135    PT Time Calculation (min) 44 min    Activity Tolerance Patient tolerated treatment well    Behavior During Therapy [facility] for tasks assessed/performed           Past Medical History:  Diagnosis Date  . Abnormality of gait 04/26/2014  . Anxiety    controls with exercise   . Arthritis    knees , shoulders  . Bradycardia 05/31/2017  . DEPRESSION 08/14/2007   Qualifier: Diagnosis of  By: Bertram Gala    . Family history of diabetes mellitus (DM) 01/23/2014  . 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 GERD 08/14/2007   Qualifier: Diagnosis of  By: Bertram Gala    . HEADACHE, CHRONIC 08/14/2007   Qualifier: Diagnosis of  By: Bertram Gala    . HYPERLIPIDEMIA 08/14/2007   Qualifier: Diagnosis of  By: Bertram Gala    . Indigestion    no medicine at present  . 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 Neoplasm of appendix   . OSTEOARTHRITIS 08/14/2007   Qualifier: Diagnosis of  By: Bertram Gala    . Other bilateral secondary osteoarthritis of knee 04/03/2015  . PONV (postoperative nausea and vomiting) 1999   partial hysterectomy in [place]  . Right knee DJD 01/23/2014  . 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 ABDOMINAL HYSTERECTOMY    . APPENDECTOMY    . JOINT REPLACEMENT Bilateral 2016   [place]  . LAPAROSCOPIC APPENDECTOMY N/A  05/02/2017   Procedure: LAPAROSCOPIC CECECTOMY;  Surgeon: Jules Husbands, MD;  Location: [facility];  Service: General;  Laterality: N/A;  Please open Lap appy tray.  Have gelport and Right hemicolectomy cart in the room.   Marland Kitchen LAPAROSCOPIC RIGHT HEMI COLECTOMY Right 09/26/2017   Procedure: LAPAROSCOPIC RIGHT HEMI COLECTOMY;  Surgeon: Jules Husbands, MD;  Location: [facility];  Service: General;  Laterality: Right;  . TOTAL ABDOMINAL HYSTERECTOMY W/ BILATERAL SALPINGOOPHORECTOMY  2001   done at [facility]  . TOTAL KNEE ARTHROPLASTY Right 04/03/2015   Procedure: RIGHT TOTAL KNEE ARTHROPLASTY;  Surgeon: Leandrew Koyanagi, MD;  Location: [place];  Service: Orthopedics;  Laterality: Right;  . TOTAL KNEE ARTHROPLASTY Left 04/03/2015   Procedure: LEFT TOTAL KNEE ARTHROPLASTY;  Surgeon: Leandrew Koyanagi, MD;  Location: [place];  Service: Orthopedics;  Laterality: Left;  Marland Kitchen VAGINAL DELIVERY     ?x2    There were no vitals filed for this visit.   Subjective Assessment - 05/06/20 1051    Subjective Pt denies pain upon arrival. She states she has been compliant with HEP and regular exercises. She reports having pain/stiffness the day it was snowing but is not sure if that is more because of the cold.    Patient is accompained by: --  Pertinent History 2017 bilateral TKA    Limitations Other (comment)   stairs   Diagnostic tests left knee nondisplaced vertical patella fracture.    Patient Stated Goals Exercise    Currently in Pain? No/denies    Pain Score 0-No pain    Pain Onset More than a month ago              [facility] PT Assessment - 05/06/20 0001      Assessment   Medical Diagnosis L knee pain, acute, non-displaced patellar fracture    Referring Provider (PT) Dr. Erlinda Hong, Dwana [place] , [place]    Onset Date/Surgical Date 01/21/20                         [facility] Adult PT Treatment/Exercise - 05/06/20 0001      Self-Care   Self-Care Other Self-Care Comments    Other Self-Care Comments   Reviewed and updated HEP to include standing hip ABD and marches. Discussed use of stationary bike at home at resistance within tolerance. Equal weightbearing through each LE, especially during sit<>stand due to pt shifting weight to RLE.      Knee/Hip Exercises: Stretches   Active Hamstring Stretch Both;30 seconds    Active Hamstring Stretch Limitations seated at EOM      Knee/Hip Exercises: Aerobic   Recumbent Bike L3 x 5 minutes      Knee/Hip Exercises: Standing   Hip Abduction Stengthening;Both;2 sets;10 reps;Knee straight    Forward Step Up Left;2 sets;15 reps;Step Height: 4"    Forward Step Up Limitations with R knee drive      Knee/Hip Exercises: Seated   Long Arc Quad Strengthening;Left;2 sets;15 reps;Weights    Long Arc Quad Weight 2 lbs.    Sit to Sand 2 sets;10 reps;without UE support   standing on 4" step in front of low mat. Cues for equal weightbearing due to pt having decreased WBing on LLE during STS.     Knee/Hip Exercises: Supine   Bridges with Diona Foley Squeeze Strengthening;Both;2 sets;10 reps   cues for glute activation                 PT Education - 05/06/20 1214    Education Details Reviewed and updated HEP to include standing hip ABD and marches. Discussed use of stationary bike at home at resistance within tolerance. Equal weightbearing through each LE, especially during sit<>stand due to pt shifting weight to RLE.    Person(s) Educated Patient    Methods Explanation;Handout    Comprehension Verbalized understanding;Returned demonstration            PT Short Term Goals - 05/06/20 1215      PT SHORT TERM GOAL #1   Title independent with HEP for knees and hips (L)    Time 4    Period Weeks    Status Achieved    Target Date 05/13/20      PT SHORT TERM GOAL #2   Title Pt will bend knee to 100 deg and have no increased patellar pain    Time 4    Period Weeks    Status On-going    Target Date 05/13/20      PT SHORT TERM GOAL #3   Title Pt will  increase symmetry of squats to demo less compensation, fear    Time 4    Period Weeks    Status On-going    Target Date 05/13/20  PT Long Term Goals - 04/15/20 1125      PT LONG TERM GOAL #1   Title Pt will be I with HEP for knee, hip.    Time 8    Period Weeks    Status New    Target Date 06/10/20      PT LONG TERM GOAL #2   Title Patient will be able to exercise at [facility] with no increase in knee pain    Time 8    Period Weeks    Status New    Target Date 06/10/20      PT LONG TERM GOAL #3   Title Pt will increase L (and R)  quad strength to 4+/5 or more to equalize functional strength in LEs    Time 8    Period Weeks    Status New    Target Date 06/10/20      PT LONG TERM GOAL #4   Title Pt will demonstrate 10 reps SLR on LLE without extensor lag for improved strength    Time 8    Period Weeks    Status New    Target Date 06/10/20                 Plan - 05/06/20 1207    Clinical Impression Statement Patient tolerated treatment session well with no adverse effects or increased pain. She was able to perform increased repetitions of exercises today and progressed to LAQ with 2# ankle weight and focus on eccentric control.    PT Treatment/Interventions ADLs/Self Care Home Management;Therapeutic exercise;Balance training;Functional mobility training;Therapeutic activities;Cryotherapy;Passive range of motion;Moist Heat;Patient/family education;Manual techniques;Gait training;Taping    PT Next Visit Plan Review and update HEP PRN. Progress L quad/hip strength. Balance training and gait training on unlevel surface (stepping on foam pads) as tolerated. Bike vs elliptical as indicated    PT Home Exercise Plan [id number] - 3 way hip, quad set. added LAQ, sit to stand, quad and hamstring stretch, standing marches, standing hip ABD    Consulted and Agree with Plan of Care Patient           Patient will benefit from skilled therapeutic intervention  in order to improve the following deficits and impairments:  Impaired flexibility,Postural dysfunction,Pain,Decreased strength,Decreased range of motion,Increased edema,Difficulty walking,Decreased balance,Decreased mobility,Decreased endurance  Visit Diagnosis: Muscle weakness (generalized)  Acute pain of left knee     Problem List Patient Active Problem List   Diagnosis Date Noted  . Essential hypertension 02/25/2020  . Overweight 02/25/2020  . S/P right colectomy 09/26/2017  . Low grade mucinous neoplasm of appendix   . 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 Bradycardia 05/31/2017  . S/P TKR (total knee replacement), bilateral 10/07/2016  . 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 Family history of diabetes mellitus (DM) 01/23/2014  . Right knee DJD 01/23/2014  . Hyperlipidemia 01/23/2014  . HYPERLIPIDEMIA 08/14/2007  . DEPRESSION 08/14/2007  . GERD 08/14/2007  . HEADACHE, CHRONIC 08/14/2007      Haydee Monica, PT, DPT 05/06/20 1:19 PM  [facility] [address] [place], [place], [address] Phone: [phone]   Fax:  [phone]  Name: Tina Johnson MRN: [id number] Date of Birth: [birth year]

## 2020-05-13 ENCOUNTER — Ambulatory Visit: Payer: 59 | Admitting: Physical Therapy

## 2020-05-13 ENCOUNTER — Other Ambulatory Visit: Payer: Self-pay

## 2020-05-13 DIAGNOSIS — M25562 Pain in left knee: Secondary | ICD-10-CM

## 2020-05-13 DIAGNOSIS — M6281 Muscle weakness (generalized): Secondary | ICD-10-CM

## 2020-05-13 NOTE — Therapy (Signed)
[place], [place], [address] Phone: [phone]   Fax:  [phone]  Physical Therapy Treatment  Patient Details  Name: Tina Johnson MRN: [id number] Date of Birth: [birth year] Referring Provider (PT): Dr. Erlinda Hong, Dwana Melena , [place]   Encounter Date: 05/13/2020   PT End of Session - 05/13/20 1106    Visit Number 4    Number of Visits 8    Date for PT Re-Evaluation 06/10/20    Authorization Type CAFA    Authorization Time Period 5/22    PT Start Time 1055    PT Stop Time 1138    PT Time Calculation (min) 43 min    Activity Tolerance Patient tolerated treatment well    Behavior During Therapy [facility] for tasks assessed/performed           Past Medical History:  Diagnosis Date  . Abnormality of gait 04/26/2014  . Anxiety    controls with exercise   . Arthritis    knees , shoulders  . Bradycardia 05/31/2017  . DEPRESSION 08/14/2007   Qualifier: Diagnosis of  By: Bertram Gala    . Family history of diabetes mellitus (DM) 01/23/2014  . 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 GERD 08/14/2007   Qualifier: Diagnosis of  By: Bertram Gala    . HEADACHE, CHRONIC 08/14/2007   Qualifier: Diagnosis of  By: Bertram Gala    . HYPERLIPIDEMIA 08/14/2007   Qualifier: Diagnosis of  By: Bertram Gala    . Indigestion    no medicine at present  . 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 Neoplasm of appendix   . OSTEOARTHRITIS 08/14/2007   Qualifier: Diagnosis of  By: Bertram Gala    . Other bilateral secondary osteoarthritis of knee 04/03/2015  . PONV (postoperative nausea and vomiting) 1999   partial hysterectomy in [place]  . Right knee DJD 01/23/2014  . 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 ABDOMINAL HYSTERECTOMY    . APPENDECTOMY    . JOINT REPLACEMENT Bilateral 2016   [place]   . LAPAROSCOPIC APPENDECTOMY N/A 05/02/2017   Procedure: LAPAROSCOPIC CECECTOMY;  Surgeon: Jules Husbands, MD;  Location: [facility];  Service: General;  Laterality: N/A;  Please open Lap appy tray.  Have gelport and Right hemicolectomy cart in the room.   Marland Kitchen LAPAROSCOPIC RIGHT HEMI COLECTOMY Right 09/26/2017   Procedure: LAPAROSCOPIC RIGHT HEMI COLECTOMY;  Surgeon: Jules Husbands, MD;  Location: [facility];  Service: General;  Laterality: Right;  . TOTAL ABDOMINAL HYSTERECTOMY W/ BILATERAL SALPINGOOPHORECTOMY  2001   done at [facility]  . TOTAL KNEE ARTHROPLASTY Right 04/03/2015   Procedure: RIGHT TOTAL KNEE ARTHROPLASTY;  Surgeon: Leandrew Koyanagi, MD;  Location: [place];  Service: Orthopedics;  Laterality: Right;  . TOTAL KNEE ARTHROPLASTY Left 04/03/2015   Procedure: LEFT TOTAL KNEE ARTHROPLASTY;  Surgeon: Leandrew Koyanagi, MD;  Location: [place];  Service: Orthopedics;  Laterality: Left;  Marland Kitchen VAGINAL DELIVERY     ?x2    There were no vitals filed for this visit.   Subjective Assessment - 05/13/20 1107    Subjective No pain today.  I do my exercises 2 x per day is that too much?    Currently in Pain? No/denies              [facility] PT Assessment -  05/13/20 0001      Strength   Right Knee Flexion 4/5    Right Knee Extension 5/5    Left Knee Flexion 4+/5    Left Knee Extension 4+/5             [facility] Adult PT Treatment/Exercise - 05/13/20 0001      Knee/Hip Exercises: Stretches   Active Hamstring Stretch Limitations brief after bridging to release hamstring      Knee/Hip Exercises: Aerobic   Recumbent Bike L3 x 5 minutes      Knee/Hip Exercises: Standing   Heel Raises Both;1 set;15 reps    Heel Raises Limitations ball for alignment    Forward Lunges 5 reps;Both;1 set      Knee/Hip Exercises: Seated   Long Arc Quad Left;2 sets;15 reps;Weights    Long Arc Quad Weight 4 lbs.    Hamstring Curl Left;15 reps;2 sets    Hamstring Limitations blue band (PT assist then patient indpendent)       Knee/Hip Exercises: Supine   Bridges 2 sets    Bridges Limitations blue band    Bridges with [employer] 2 sets    Straight Leg Raises Left;2 sets;10 reps    Straight Leg Raises Limitations 3 lbs                    PT Short Term Goals - 05/13/20 1108      PT SHORT TERM GOAL #1   Title independent with HEP for knees and hips (L)    Status Achieved      PT SHORT TERM GOAL #2   Title Pt will bend knee to 100 deg and have no increased patellar pain    Status Achieved      PT SHORT TERM GOAL #3   Title Pt will increase symmetry of squats to demo less compensation, fear    Status Achieved             PT Long Term Goals - 05/13/20 1111      PT LONG TERM GOAL #1   Title Pt will be I with HEP for knee, hip.    Status On-going      PT LONG TERM GOAL #2   Title Patient will be able to exercise at [employer] with no increase in knee pain    Status Achieved      PT LONG TERM GOAL #3   Title Pt will increase L (and R)  quad strength to 4+/5 or more to equalize functional strength in LEs    Status Achieved      PT LONG TERM GOAL #4   Title Pt will demonstrate 10 reps SLR on LLE without extensor lag for improved strength    Status Achieved                 Plan - 05/13/20 1141    Clinical Impression Statement Pt shows increased strength in L quad today, able to do SLR with 3 lbs weight on LLE.  Fatigued and quad fatigued with extension exercises through full ROM and with a partial static lunge.  No pain for 1-2 weeks in L knee. She is DC from Orthopedic.  Cont POC for strengthening, 3-4 more visits.    PT Treatment/Interventions ADLs/Self Care Home Management;Therapeutic exercise;Balance training;Functional mobility training;Therapeutic activities;Cryotherapy;Passive range of motion;Moist Heat;Patient/family education;Manual techniques;Gait training;Taping    PT Next Visit Plan elliptical, L quad, hip    PT Home Exercise Plan [id number] - 3 way  hip, quad  set. added LAQ, sit to stand, quad and hamstring stretch, standing marches, standing hip ABD, hamstring curl with band    Consulted and Agree with Plan of Care Patient           Patient will benefit from skilled therapeutic intervention in order to improve the following deficits and impairments:  Impaired flexibility,Postural dysfunction,Pain,Decreased strength,Decreased range of motion,Increased edema,Difficulty walking,Decreased balance,Decreased mobility,Decreased endurance  Visit Diagnosis: Muscle weakness (generalized)  Acute pain of left knee     Problem List Patient Active Problem List   Diagnosis Date Noted  . Essential hypertension 02/25/2020  . Overweight 02/25/2020  . S/P right colectomy 09/26/2017  . Low grade mucinous neoplasm of appendix   . 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 Bradycardia 05/31/2017  . S/P TKR (total knee replacement), bilateral 10/07/2016  . 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 Family history of diabetes mellitus (DM) 01/23/2014  . Right knee DJD 01/23/2014  . Hyperlipidemia 01/23/2014  . HYPERLIPIDEMIA 08/14/2007  . DEPRESSION 08/14/2007  . GERD 08/14/2007  . HEADACHE, CHRONIC 08/14/2007    PAA,JENNIFER 05/13/2020, 11:55 AM  [facility] [address] [place], [place], [address] Phone: [phone]   Fax:  [phone]  Name: Ladene Gilkeson MRN: [id number] Date of Birth: Sep 27, 1965  Raeford Razor, PT 05/13/20 11:56 AM Phone: [phone] Fax: [phone]

## 2020-05-20 ENCOUNTER — Ambulatory Visit: Payer: 59 | Attending: Physician Assistant | Admitting: Physical Therapy

## 2020-05-20 ENCOUNTER — Encounter: Payer: Self-pay | Admitting: Physical Therapy

## 2020-05-20 ENCOUNTER — Other Ambulatory Visit: Payer: Self-pay

## 2020-05-20 DIAGNOSIS — M6281 Muscle weakness (generalized): Secondary | ICD-10-CM | POA: Insufficient documentation

## 2020-05-20 DIAGNOSIS — M25562 Pain in left knee: Secondary | ICD-10-CM | POA: Diagnosis present

## 2020-05-20 NOTE — Therapy (Signed)
[place], [place], [address] Phone: [phone]   Fax:  [phone]  Physical Therapy Treatment  Patient Details  Name: Tina Johnson MRN: [id number] Date of Birth: 29-Nov-1965 Referring Provider (PT): Dr. Erlinda Hong, Dwana Melena , [place]   Encounter Date: 05/20/2020   PT End of Session - 05/20/20 1058    Visit Number 5    Number of Visits 8    Date for PT Re-Evaluation 06/10/20    Authorization Type CAFA    Authorization Time Period 5/22    PT Start Time 1050    PT Stop Time 1130    PT Time Calculation (min) 40 min    Activity Tolerance Patient tolerated treatment well    Behavior During Therapy [facility] for tasks assessed/performed           Past Medical History:  Diagnosis Date  . Abnormality of gait 04/26/2014  . Anxiety    controls with exercise   . Arthritis    knees , shoulders  . Bradycardia 05/31/2017  . DEPRESSION 08/14/2007   Qualifier: Diagnosis of  By: Bertram Gala    . Family history of diabetes mellitus (DM) 01/23/2014  . 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 GERD 08/14/2007   Qualifier: Diagnosis of  By: Bertram Gala    . HEADACHE, CHRONIC 08/14/2007   Qualifier: Diagnosis of  By: Bertram Gala    . HYPERLIPIDEMIA 08/14/2007   Qualifier: Diagnosis of  By: Bertram Gala    . Indigestion    no medicine at present  . 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 Neoplasm of appendix   . OSTEOARTHRITIS 08/14/2007   Qualifier: Diagnosis of  By: Bertram Gala    . Other bilateral secondary osteoarthritis of knee 04/03/2015  . PONV (postoperative nausea and vomiting) 1999   partial hysterectomy in [place]  . Right knee DJD 01/23/2014  . 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 ABDOMINAL HYSTERECTOMY    . APPENDECTOMY    . JOINT REPLACEMENT Bilateral 2016   [place]   . LAPAROSCOPIC APPENDECTOMY N/A 05/02/2017   Procedure: LAPAROSCOPIC CECECTOMY;  Surgeon: Jules Husbands, MD;  Location: [facility];  Service: General;  Laterality: N/A;  Please open Lap appy tray.  Have gelport and Right hemicolectomy cart in the room.   Marland Kitchen LAPAROSCOPIC RIGHT HEMI COLECTOMY Right 09/26/2017   Procedure: LAPAROSCOPIC RIGHT HEMI COLECTOMY;  Surgeon: Jules Husbands, MD;  Location: [facility];  Service: General;  Laterality: Right;  . TOTAL ABDOMINAL HYSTERECTOMY W/ BILATERAL SALPINGOOPHORECTOMY  2001   done at [facility]  . TOTAL KNEE ARTHROPLASTY Right 04/03/2015   Procedure: RIGHT TOTAL KNEE ARTHROPLASTY;  Surgeon: Leandrew Koyanagi, MD;  Location: [place];  Service: Orthopedics;  Laterality: Right;  . TOTAL KNEE ARTHROPLASTY Left 04/03/2015   Procedure: LEFT TOTAL KNEE ARTHROPLASTY;  Surgeon: Leandrew Koyanagi, MD;  Location: [place];  Service: Orthopedics;  Laterality: Left;  Marland Kitchen VAGINAL DELIVERY     ?x2    There were no vitals filed for this visit.   Subjective Assessment - 05/20/20 1100    Subjective I have been doing the exercises at home.  No gym due to [place].                  [facility] Adult PT Treatment/Exercise - 05/20/20 0001  Knee/Hip Exercises: Stretches   Piriformis Stretch 3 reps;20 seconds    Other Knee/Hip Stretches knee to chest x 3 each then knees wide with ER/IR for hip mobility      Knee/Hip Exercises: Aerobic   Nustep 5 min L5 LE only      Knee/Hip Exercises: Standing   Heel Raises Both;1 set;15 reps    Heel Raises Limitations turnout    Hip Abduction Both;1 set;15 reps    Forward Step Up Left;1 set;20 reps;Hand Hold: 1;Step Height: 6"    Step Down Left;1 set;10 reps;Hand Hold: 1    Step Down Limitations from 6 inch to 4 inch    Other Standing Knee Exercises banded walking: lateral and then tap backs green band 30 sec bouts x 6           sit to stand with ball between knees x 15          PT Short Term Goals - 05/13/20 1108      PT  SHORT TERM GOAL #1   Title independent with HEP for knees and hips (L)    Status Achieved      PT SHORT TERM GOAL #2   Title Pt will bend knee to 100 deg and have no increased patellar pain    Status Achieved      PT SHORT TERM GOAL #3   Title Pt will increase symmetry of squats to demo less compensation, fear    Status Achieved             PT Long Term Goals - 05/13/20 1111      PT LONG TERM GOAL #1   Title Pt will be I with HEP for knee, hip.    Status On-going      PT LONG TERM GOAL #2   Title Patient will be able to exercise at [employer] with no increase in knee pain    Status Achieved      PT LONG TERM GOAL #3   Title Pt will increase L (and R)  quad strength to 4+/5 or more to equalize functional strength in LEs    Status Achieved      PT LONG TERM GOAL #4   Title Pt will demonstrate 10 reps SLR on LLE without extensor lag for improved strength    Status Achieved                 Plan - 05/20/20 1100    Clinical Impression Statement Tolerated session well, shows limitation in hip AROM, worked on this end of session.  Painful with step downs, even with 2 inches.  Cont POC. Has a machine at home and will take a picture for it.    PT Treatment/Interventions ADLs/Self Care Home Management;Therapeutic exercise;Balance training;Functional mobility training;Therapeutic activities;Cryotherapy;Passive range of motion;Moist Heat;Patient/family education;Manual techniques;Gait training;Taping    PT Next Visit Plan elliptical, L quad, hip ROM    PT Home Exercise Plan [id number] - 3 way hip, quad set. added LAQ, sit to stand, quad and hamstring stretch, standing marches, standing hip ABD, hamstring curl with band    Consulted and Agree with Plan of Care Patient           Patient will benefit from skilled therapeutic intervention in order to improve the following deficits and impairments:  Impaired flexibility,Postural dysfunction,Pain,Decreased strength,Decreased  range of motion,Increased edema,Difficulty walking,Decreased balance,Decreased mobility,Decreased endurance  Visit Diagnosis: Muscle weakness (generalized)  Acute pain of left knee     Problem List  Patient Active Problem List   Diagnosis Date Noted  . Essential hypertension 02/25/2020  . Overweight 02/25/2020  . S/P right colectomy 09/26/2017  . Low grade mucinous neoplasm of appendix   . 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 Bradycardia 05/31/2017  . S/P TKR (total knee replacement), bilateral 10/07/2016  . 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 Family history of diabetes mellitus (DM) 01/23/2014  . Right knee DJD 01/23/2014  . Hyperlipidemia 01/23/2014  . HYPERLIPIDEMIA 08/14/2007  . DEPRESSION 08/14/2007  . GERD 08/14/2007  . HEADACHE, CHRONIC 08/14/2007    PAA,JENNIFER 05/20/2020, 12:41 PM  [facility] [address] [place], [place], [address] Phone: [phone]   Fax:  [phone]  Name: Tina Johnson MRN: [id number] Date of Birth: Jul 14, 1965  Raeford Razor, PT 05/20/20 12:42 PM Phone: [phone] Fax: [phone]

## 2020-05-27 ENCOUNTER — Encounter: Payer: Self-pay | Admitting: Physical Therapy

## 2020-05-27 ENCOUNTER — Other Ambulatory Visit: Payer: Self-pay

## 2020-05-27 ENCOUNTER — Ambulatory Visit: Payer: 59 | Admitting: Physical Therapy

## 2020-05-27 DIAGNOSIS — M6281 Muscle weakness (generalized): Secondary | ICD-10-CM | POA: Diagnosis not present

## 2020-05-27 DIAGNOSIS — M25562 Pain in left knee: Secondary | ICD-10-CM

## 2020-05-27 NOTE — Therapy (Signed)
[place], [place], [address] Phone: [phone]   Fax:  [phone]  Physical Therapy Treatment/Discharge  Patient Details  Name: Tina Johnson MRN: [id number] Date of Birth: August 03, 1965 Referring Provider (PT): Dr. Erlinda Hong, Dwana Melena , [place]   Encounter Date: 05/27/2020   PT End of Session - 05/27/20 1105    Visit Number 6    Number of Visits 8    Date for PT Re-Evaluation 06/10/20    Authorization Type CAFA    PT Start Time 1050    PT Stop Time 1130    PT Time Calculation (min) 40 min           Past Medical History:  Diagnosis Date  . Abnormality of gait 04/26/2014  . Anxiety    controls with exercise   . Arthritis    knees , shoulders  . Bradycardia 05/31/2017  . DEPRESSION 08/14/2007   Qualifier: Diagnosis of  By: Bertram Gala    . Family history of diabetes mellitus (DM) 01/23/2014  . 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 GERD 08/14/2007   Qualifier: Diagnosis of  By: Bertram Gala    . HEADACHE, CHRONIC 08/14/2007   Qualifier: Diagnosis of  By: Bertram Gala    . HYPERLIPIDEMIA 08/14/2007   Qualifier: Diagnosis of  By: Bertram Gala    . Indigestion    no medicine at present  . 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 Neoplasm of appendix   . OSTEOARTHRITIS 08/14/2007   Qualifier: Diagnosis of  By: Bertram Gala    . Other bilateral secondary osteoarthritis of knee 04/03/2015  . PONV (postoperative nausea and vomiting) 1999   partial hysterectomy in [place]  . Right knee DJD 01/23/2014  . 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 ABDOMINAL HYSTERECTOMY    . APPENDECTOMY    . JOINT REPLACEMENT Bilateral 2016   [place]  . LAPAROSCOPIC APPENDECTOMY N/A 05/02/2017   Procedure: LAPAROSCOPIC CECECTOMY;  Surgeon: Jules Husbands, MD;  Location: [facility];  Service:  General;  Laterality: N/A;  Please open Lap appy tray.  Have gelport and Right hemicolectomy cart in the room.   Marland Kitchen LAPAROSCOPIC RIGHT HEMI COLECTOMY Right 09/26/2017   Procedure: LAPAROSCOPIC RIGHT HEMI COLECTOMY;  Surgeon: Jules Husbands, MD;  Location: [facility];  Service: General;  Laterality: Right;  . TOTAL ABDOMINAL HYSTERECTOMY W/ BILATERAL SALPINGOOPHORECTOMY  2001   done at [facility]  . TOTAL KNEE ARTHROPLASTY Right 04/03/2015   Procedure: RIGHT TOTAL KNEE ARTHROPLASTY;  Surgeon: Leandrew Koyanagi, MD;  Location: [place];  Service: Orthopedics;  Laterality: Right;  . TOTAL KNEE ARTHROPLASTY Left 04/03/2015   Procedure: LEFT TOTAL KNEE ARTHROPLASTY;  Surgeon: Leandrew Koyanagi, MD;  Location: [place];  Service: Orthopedics;  Laterality: Left;  Marland Kitchen VAGINAL DELIVERY     ?x2    There were no vitals filed for this visit.   Subjective Assessment - 05/27/20 1058    Subjective Pt brought in her pictures from home of her exercise equipment.  Did the stepper 20 min yesterday.    Currently in Pain? No/denies              [facility] PT Assessment - 05/27/20 0001      Squat   Comments decreased WB on LLE      Step Up  Comments knee control, 4 inch, 6 inch      Step Down   Comments 4 inch with bilateral UEs x 15      Single Leg Stance   Comments 30 sec each LE bilateral      Posture/Postural Control   Posture Comments L toe in      AROM   Right Knee Flexion 109    Left Knee Flexion 100      Strength   Right Hip Flexion 4/5    Left Hip Flexion 4/5    Right Knee Flexion 4+/5    Right Knee Extension 5/5    Left Knee Flexion 5/5    Left Knee Extension 5/5             [facility] Adult PT Treatment/Exercise - 05/27/20 0001      Self-Care   Other Self-Care Comments  home gym equipment      Knee/Hip Exercises: Aerobic   Elliptical 5 min L 5 ramp, level 4 resistance      Knee/Hip Exercises: Standing   Hip Abduction Both;1 set;15 reps    Functional Squat 10 reps;3 sets    Functional  Squat Limitations cues for equal weightbearing and depth of squat hip hinge                  PT Education - 05/27/20 1133    Education Details gym exercises, genenral guidleines for aerobic activity and HEP    Person(s) Educated Patient    Methods Explanation    Comprehension Verbalized understanding            PT Short Term Goals - 05/27/20 1118      PT SHORT TERM GOAL #1   Title independent with HEP for knees and hips (L)    Status Achieved      PT SHORT TERM GOAL #2   Title Pt will bend knee to 100 deg and have no increased patellar pain    Status Achieved      PT SHORT TERM GOAL #3   Title Pt will increase symmetry of squats to demo less compensation, fear    Baseline cont to need min cues unless modified for depth    Status Achieved             PT Long Term Goals - 05/27/20 1123      PT LONG TERM GOAL #1   Title Pt will be I with HEP for knee, hip.    Status Achieved      PT LONG TERM GOAL #2   Title Patient will be able to exercise at [facility] with no increase in knee pain    Baseline has not done due to COVID , has home gym equipment    Status Achieved      PT LONG TERM GOAL #3   Title Pt will increase L (and R)  quad strength to 4+/5 or more to equalize functional strength in LEs    Baseline 4+/5 to 5/5    Status Achieved      PT LONG TERM GOAL #4   Title Pt will demonstrate 10 reps SLR on LLE without extensor lag for improved strength    Status Achieved                 Plan - 05/27/20 1106    Clinical Impression Statement Patient has met her LTG and feels ready for discharge.  She did have several questions related to general exercise.  Will cont to do her HEP and use elliptical 2-3 days per week.    PT Treatment/Interventions ADLs/Self Care Home Management;Therapeutic exercise;Balance training;Functional mobility training;Therapeutic activities;Cryotherapy;Passive range of motion;Moist Heat;Patient/family education;Manual  techniques;Gait training;Taping    PT Next Visit Plan NA, DC from [place] [id number] - 3 way hip, quad set. added LAQ, sit to stand, quad and hamstring stretch, standing marches, standing hip ABD, hamstring curl with band    Consulted and Agree with Plan of Care Patient           Patient will benefit from skilled therapeutic intervention in order to improve the following deficits and impairments:  Impaired flexibility,Postural dysfunction,Pain,Decreased strength,Decreased range of motion,Increased edema,Difficulty walking,Decreased balance,Decreased mobility,Decreased endurance  Visit Diagnosis: Muscle weakness (generalized)  Acute pain of left knee     Problem List Patient Active Problem List   Diagnosis Date Noted  . Essential hypertension 02/25/2020  . Overweight 02/25/2020  . S/P right colectomy 09/26/2017  . Low grade mucinous neoplasm of appendix   . 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 Bradycardia 05/31/2017  . S/P TKR (total knee replacement), bilateral 10/07/2016  . 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 Family history of diabetes mellitus (DM) 01/23/2014  . Right knee DJD 01/23/2014  . Hyperlipidemia 01/23/2014  . HYPERLIPIDEMIA 08/14/2007  . DEPRESSION 08/14/2007  . GERD 08/14/2007  . HEADACHE, CHRONIC 08/14/2007    Jataya Wann 05/27/2020, 3:23 PM  [place] [facility] [address] [place], [place], [address] Phone: [phone]   Fax:  [phone]  Name: Tina Johnson MRN: [id number] Date of Birth: 08-Aug-1965  PHYSICAL THERAPY DISCHARGE SUMMARY  Visits from Start of Care: 6  Current functional level related to goals / functional outcomes: See above, goals    Remaining deficits: :LE alignment issues    Education / Equipment: HEP, exercising   Plan: Patient agrees to discharge.  Patient goals were met. Patient is  being discharged due to meeting the stated rehab goals.  ?????    Raeford Razor, PT 05/27/20 3:23 PM Phone: [phone] Fax: [phone]

## 2020-06-03 ENCOUNTER — Ambulatory Visit: Payer: 59 | Admitting: Physical Therapy

## 2020-06-10 ENCOUNTER — Encounter: Payer: Self-pay | Admitting: Physical Therapy

## 2020-06-17 ENCOUNTER — Encounter: Payer: Self-pay | Admitting: Physical Therapy

## 2020-06-18 ENCOUNTER — Telehealth: Payer: Self-pay | Admitting: Internal Medicine

## 2020-06-18 NOTE — Telephone Encounter (Signed)
Copied from [place] [id number]. Topic: General - Other >> Jun 18, 2020  3:25 PM Erick Blinks wrote: Reason for CRM: Pt called reporting severe burning whenever she urinates, she requested a sooner available appt. Wants to speak to nurse since her appt next tuesday is the soonest available   Best contact: [phone]  Please follow up or would you prefer patient to be referred to mobile?

## 2020-06-18 NOTE — Telephone Encounter (Signed)
Please see if any provider has any availability if not please refer to mobile bus

## 2020-06-23 NOTE — Telephone Encounter (Signed)
Patient has appointment with Wynetta Emery tomorrow morning. Do you want me to switch her to in person for her symptoms or keep virtual?

## 2020-06-24 ENCOUNTER — Other Ambulatory Visit: Payer: Self-pay

## 2020-06-24 ENCOUNTER — Ambulatory Visit: Payer: 59 | Attending: Internal Medicine | Admitting: Internal Medicine

## 2020-06-24 ENCOUNTER — Other Ambulatory Visit: Payer: Self-pay | Admitting: Internal Medicine

## 2020-06-24 ENCOUNTER — Other Ambulatory Visit (HOSPITAL_COMMUNITY)
Admission: RE | Admit: 2020-06-24 | Discharge: 2020-06-24 | Disposition: A | Discharge status: Home / Self Care | Payer: 59 | Source: Ambulatory Visit | Attending: Internal Medicine | Admitting: Internal Medicine

## 2020-06-24 DIAGNOSIS — N898 Other specified noninflammatory disorders of vagina: Secondary | ICD-10-CM | POA: Diagnosis present

## 2020-06-24 DIAGNOSIS — R300 Dysuria: Secondary | ICD-10-CM

## 2020-06-24 LAB — POCT URINALYSIS DIP (CLINITEK)
Bilirubin, UA: NEGATIVE
Glucose, UA: NEGATIVE mg/dL
Ketones, POC UA: NEGATIVE mg/dL
Leukocytes, UA: NEGATIVE
Nitrite, UA: NEGATIVE
POC PROTEIN,UA: NEGATIVE
Spec Grav, UA: 1.02 (ref 1.010–1.025)
Urobilinogen, UA: 0.2 E.U./dL
pH, UA: 6.5 (ref 5.0–8.0)

## 2020-06-24 MED ORDER — SULFAMETHOXAZOLE-TRIMETHOPRIM 800-160 MG PO TABS: 1.0000 | ORAL_TABLET | Freq: Two times a day (BID) | ORAL | 0 refills | Status: DC

## 2020-06-24 NOTE — Progress Notes (Signed)
Virtual Visit via Telephone Note  I connected with Tina Johnson on 06/24/20 at 8:32 a.m by telephone and verified that I am speaking with the correct person using two identifiers.  Location: Patient: home Provider: office  the pt, myself and Drue Novel [phone]) from [employer] participated in this encounter I discussed the limitations, risks, security and privacy concerns of performing an evaluation and management service by telephone and the availability of in person appointments. I also discussed with the patient that there may be a patient responsible charge related to this service. The patient expressed understanding and agreed to proceed.   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 Last seen 02/2020.  Pt c/o burning in the vaginal area No abn vaginal dischg.  Little vaginal itching Sexually active with one partner.  No fever.  No LBP    Outpatient Encounter Medications as of 06/24/2020  Medication Sig  . amLODipine (NORVASC) 5 MG tablet Take 1 tablet (5 mg total) by mouth daily.  Marland Kitchen CALCIUM PO Take 1 tablet by mouth 2 (two) times daily.   . Cholecalciferol (VITAMIN D3) 2000 units TABS Take 2,000 Units by mouth daily.   Marland Kitchen conjugated estrogens (PREMARIN) vaginal cream Place 1 g vaginally 2 (two) times a week. Monday & Thursday  . Multiple Vitamin (MULTIVITAMIN WITH MINERALS) TABS tablet Take 1 tablet by mouth 2 (two) times daily.  . OMEGA-3 FATTY ACIDS PO Take 1 capsule by mouth 2 (two) times daily.   . pravastatin (PRAVACHOL) 20 MG tablet Take 0.5 tablets (10 mg total) by mouth daily. (Patient not taking: Reported on 04/15/2020)   No facility-administered encounter medications on file as of 06/24/2020.      Observations/Objective: No direct observation done as this was a telephone encounter.  However I would mention that it was very challenging in  getting history out of the patient as she went off in tangents and had to be redirected.  At times she made confusing statements.  At one point she made reference to her knee and I thought she was talking about her knee pain but in actuality after much clarification, she was talking about vaginal pain.  Assessment and Plan: 1. Dysuria Patient will come to the lab today to have a point-of-care UA and do a vaginal self swab.  Further management will be based on results. - POCT URINALYSIS DIP (CLINITEK)  2. Vaginal itching - Cervicovaginal ancillary only   Follow Up Instructions: PRN   I discussed the assessment and treatment plan with the patient. The patient was provided an opportunity to ask questions and all were answered. The patient agreed with the plan and demonstrated an understanding of the instructions.   The patient was advised to call back or seek an in-person evaluation if the symptoms worsen or if the condition fails to improve as anticipated.  I provided 21 minutes of non-face-to-face time during this encounter.   Karle Plumber, MD

## 2020-06-25 ENCOUNTER — Telehealth: Payer: Self-pay

## 2020-06-25 LAB — CERVICOVAGINAL ANCILLARY ONLY
Bacterial Vaginitis (gardnerella): NEGATIVE
Candida Glabrata: NEGATIVE
Candida Vaginitis: NEGATIVE
Chlamydia: NEGATIVE
Comment: NEGATIVE
Comment: NEGATIVE
Comment: NEGATIVE
Comment: NEGATIVE
Comment: NEGATIVE
Comment: NORMAL
Neisseria Gonorrhea: NEGATIVE
Trichomonas: NEGATIVE

## 2020-06-25 NOTE — Telephone Encounter (Signed)
[place]  Id# [phone]  contacted pt to go over lab results pt is aware and doesn't have any questions or concerns

## 2020-11-17 ENCOUNTER — Ambulatory Visit: Payer: Self-pay | Admitting: *Deleted

## 2020-11-17 NOTE — Telephone Encounter (Signed)
I called using [place] [phone] Reason for Disposition  Back pain is a chronic symptom (recurrent or ongoing AND present > 4 weeks)    Back pain into chest since having Covid 2 months ago.   Has an appt set up to be seen about it.  Answer Assessment - Initial Assessment Questions 1. ONSET: "When did the pain begin?"      2 months ago I started having back pain going into my chest.  Since I had Covid.    It feels like something is hard moving in my chest.  Does not make it hard to breath.   She was asking about the pneumonia vaccine.   I let her know to discuss it when she comes in for her appt with Geryl Rankins, PA. 2. LOCATION: "Where does it hurt?" (upper, mid or lower back)     It's on one side of my back near my bra strap on the left.   She asked about a mammogram.   I need to have a mammogram.  They always call when it's due so I guess they will call me.   It's due in Sept.     She then asked what exercises she can and cannot do?   I let her know she would need to talk with Geryl Rankins about that.  She responded ok via the interpreter.  At this point the triage was stopped since she has an appt set up with Geryl Rankins to talk about these issues.     3. SEVERITY: "How bad is the pain?"  (e.g., Scale 1-10; mild, moderate, or severe)   - MILD (1-3): doesn't interfere with normal activities    - MODERATE (4-7): interferes with normal activities or awakens from sleep    - SEVERE (8-10): excruciating pain, unable to do any normal activities      *No Answer* 4. PATTERN: "Is the pain constant?" (e.g., yes, no; constant, intermittent)      *No Answer* 5. RADIATION: "Does the pain shoot into your legs or elsewhere?"     *No Answer* 6. CAUSE:  "What do you think is causing the back pain?"      *No Answer* 7. BACK OVERUSE:  "Any recent lifting of heavy objects, strenuous work or exercise?"     *No Answer* 8. MEDICATIONS: "What have you taken so far for the pain?"  (e.g., nothing, acetaminophen, NSAIDS)     *No Answer* 9. NEUROLOGIC SYMPTOMS: "Do you have any weakness, numbness, or problems with bowel/bladder control?"     *No Answer* 10. OTHER SYMPTOMS: "Do you have any other symptoms?" (e.g., fever, abdominal pain, burning with urination, blood in urine)       *No Answer* 11. PREGNANCY: "Is there any chance you are pregnant?" (e.g., yes, no; LMP)       *No Answer*  Protocols used: Back Pain-A-AH

## 2020-11-17 NOTE — Telephone Encounter (Signed)
I returned pt's call using [place] [phone] fr Spanish.    See triage notes.   She has an appt set up with Geryl Rankins, for the back pain going into her chest from having Covid 2 months ago this has been going on.   I answered a few other questions for her and she thanked me for my help.  She will talk with Zelda more during her appt.    I let her know Zelda would need to be the one to evaluate her and give her further directions on what she could and could not do.

## 2020-12-02 ENCOUNTER — Ambulatory Visit (HOSPITAL_COMMUNITY)
Admission: RE | Admit: 2020-12-02 | Discharge: 2020-12-02 | Disposition: A | Discharge status: Home / Self Care | Payer: 59 | Source: Ambulatory Visit | Attending: Nurse Practitioner | Admitting: Nurse Practitioner

## 2020-12-02 ENCOUNTER — Other Ambulatory Visit: Payer: Self-pay | Admitting: Nurse Practitioner

## 2020-12-02 ENCOUNTER — Encounter: Payer: Self-pay | Admitting: Nurse Practitioner

## 2020-12-02 ENCOUNTER — Ambulatory Visit: Payer: 59 | Attending: Nurse Practitioner | Admitting: Nurse Practitioner

## 2020-12-02 ENCOUNTER — Other Ambulatory Visit: Payer: Self-pay

## 2020-12-02 VITALS — BP 149/86 | HR 50 | Temp 98.2°F | Ht <= 58 in | Wt 127.6 lb

## 2020-12-02 DIAGNOSIS — G8929 Other chronic pain: Secondary | ICD-10-CM | POA: Diagnosis present

## 2020-12-02 DIAGNOSIS — F419 Anxiety disorder, unspecified: Secondary | ICD-10-CM

## 2020-12-02 DIAGNOSIS — M546 Pain in thoracic spine: Secondary | ICD-10-CM

## 2020-12-02 DIAGNOSIS — E782 Mixed hyperlipidemia: Secondary | ICD-10-CM | POA: Diagnosis not present

## 2020-12-02 DIAGNOSIS — D72829 Elevated white blood cell count, unspecified: Secondary | ICD-10-CM

## 2020-12-02 DIAGNOSIS — Z1231 Encounter for screening mammogram for malignant neoplasm of breast: Secondary | ICD-10-CM

## 2020-12-02 DIAGNOSIS — I10 Essential (primary) hypertension: Secondary | ICD-10-CM | POA: Diagnosis not present

## 2020-12-02 MED ORDER — AMLODIPINE BESYLATE 5 MG PO TABS: 5.0000 mg | ORAL_TABLET | Freq: Every day | ORAL | 2 refills | Status: DC

## 2020-12-02 MED ORDER — HYDROXYZINE HCL 10 MG PO TABS: 10.0000 mg | ORAL_TABLET | Freq: Three times a day (TID) | ORAL | 1 refills | Status: DC | PRN

## 2020-12-02 MED ORDER — PRAVASTATIN SODIUM 20 MG PO TABS: 10.0000 mg | ORAL_TABLET | Freq: Every day | ORAL | 3 refills | Status: DC
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
## 2020-12-03 ENCOUNTER — Encounter: Payer: Self-pay | Admitting: Nurse Practitioner

## 2020-12-03 LAB — CBC WITH DIFFERENTIAL/PLATELET
Basophils Absolute: 0 10*3/uL (ref 0.0–0.2)
Basos: 0 %
EOS (ABSOLUTE): 0.1 10*3/uL (ref 0.0–0.4)
Eos: 2 %
Hematocrit: 44.9 % (ref 34.0–46.6)
Hemoglobin: 14.9 g/dL (ref 11.1–15.9)
Immature Grans (Abs): 0 10*3/uL (ref 0.0–0.1)
Immature Granulocytes: 0 %
Lymphocytes Absolute: 2.5 10*3/uL (ref 0.7–3.1)
Lymphs: 35 %
MCH: 30.7 pg (ref 26.6–33.0)
MCHC: 33.2 g/dL (ref 31.5–35.7)
MCV: 93 fL (ref 79–97)
Monocytes Absolute: 0.4 10*3/uL (ref 0.1–0.9)
Monocytes: 6 %
Neutrophils Absolute: 4 10*3/uL (ref 1.4–7.0)
Neutrophils: 57 %
Platelets: 247 10*3/uL (ref 150–450)
RBC: 4.85 x10E6/uL (ref 3.77–5.28)
RDW: 13.1 % (ref 11.7–15.4)
WBC: 7.1 10*3/uL (ref 3.4–10.8)

## 2020-12-03 LAB — LIPID PANEL
Chol/HDL Ratio: 3.6 ratio (ref 0.0–4.4)
Cholesterol, Total: 231 mg/dL — ABNORMAL HIGH (ref 100–199)
HDL: 65 mg/dL (ref >39)
LDL Chol Calc (NIH): 123 mg/dL — ABNORMAL HIGH (ref 0–99)
Triglycerides: 247 mg/dL — ABNORMAL HIGH (ref 0–149)
VLDL Cholesterol Cal: 43 mg/dL — ABNORMAL HIGH (ref 5–40)

## 2020-12-03 LAB — CMP14+EGFR
ALT: 22 IU/L (ref 0–32)
AST: 27 IU/L (ref 0–40)
Albumin/Globulin Ratio: 2.4 — ABNORMAL HIGH (ref 1.2–2.2)
Albumin: 4.8 g/dL (ref 3.8–4.9)
Alkaline Phosphatase: 82 IU/L (ref 44–121)
BUN/Creatinine Ratio: 23 (ref 9–23)
BUN: 14 mg/dL (ref 6–24)
Bilirubin Total: 0.4 mg/dL (ref 0.0–1.2)
CO2: 22 mmol/L (ref 20–29)
Calcium: 9.7 mg/dL (ref 8.7–10.2)
Chloride: 103 mmol/L (ref 96–106)
Creatinine, Ser: 0.6 mg/dL (ref 0.57–1.00)
Globulin, Total: 2 g/dL (ref 1.5–4.5)
Glucose: 105 mg/dL — ABNORMAL HIGH (ref 65–99)
Potassium: 4.7 mmol/L (ref 3.5–5.2)
Sodium: 140 mmol/L (ref 134–144)
Total Protein: 6.8 g/dL (ref 6.0–8.5)
eGFR: 105 mL/min/{1.73_m2} (ref >59)

## 2020-12-30 ENCOUNTER — Ambulatory Visit (INDEPENDENT_AMBULATORY_CARE_PROVIDER_SITE_OTHER): Payer: 59 | Admitting: Orthopedic Surgery

## 2020-12-30 DIAGNOSIS — M7662 Achilles tendinitis, left leg: Secondary | ICD-10-CM | POA: Diagnosis not present

## 2021-01-06 ENCOUNTER — Encounter: Payer: Self-pay | Admitting: Orthopedic Surgery

## 2021-01-06 NOTE — Progress Notes (Signed)
Office Visit Note   Patient: Tina Johnson           Date of Birth: 11-14-1965           MRN: [id number] Visit Date: 12/30/2020              Requested by: Ladell Pier, MD [address] [place],  [place] [address] PCP: Ladell Pier, MD  Chief Complaint  Patient presents with   Left Achilles Tendon - Pain      HPI: Patient is a 55 year old woman who presents complaining of Achilles tendon pain on the left.  She states this is gotten worse over the past few days pain with ambulation.  Patient thinks it may be related to using her stationary bike and elliptical trainer.  Assessment & Plan: Visit Diagnoses:  1. Achilles tendinitis, left leg     Plan: Recommend continuing with exercise and strengthening recommended using a heel lift to unload the Achilles and Voltaren gel 3 times a day.  Follow-Up Instructions: Return if symptoms worsen or fail to improve.   Ortho Exam  Patient is alert, oriented, no adenopathy, well-dressed, normal affect, normal respiratory effort. Examination there is no palpable defect of the Achilles she has good plantarflexion of the foot with compression of the calf she has good pulses.  She is tender to palpation at the insertion of the Achilles the plantar fascia is nontender to palpation lateral compression of the calcaneus is nontender.  Imaging: No results found. No images are attached to the encounter.  Labs: Lab Results  Component Value Date   HGBA1C 5.0 01/21/2016   HGBA1C 5.3 01/29/2014   ESRSEDRATE 7 04/03/2015   CRP <0.5 04/03/2015     Lab Results  Component Value Date   ALBUMIN 4.8 12/02/2020   ALBUMIN 4.5 02/25/2020   ALBUMIN 4.3 11/29/2018    No results found for: MG Lab Results  Component Value Date   VD25OH 33.2 11/14/2017   VD25OH 40 01/21/2016   VD25OH 56 01/29/2014    No results found for: PREALBUMIN CBC EXTENDED Latest Ref Rng & Units 12/02/2020 04/19/2020 02/25/2020  WBC 3.4 - 10.8 x10E3/uL  7.1 7.5 9.0  RBC 3.77 - 5.28 x10E6/uL 4.85 4.52 4.81  HGB 11.1 - 15.9 g/dL 14.9 14.1 14.6  HCT 34.0 - 46.6 % 44.9 42.0 44.3  PLT 150 - 450 x10E3/uL 247 242 227  NEUTROABS 1.4 - 7.0 x10E3/uL 4.0 - -  LYMPHSABS 0.7 - 3.1 x10E3/uL 2.5 - -     There is no height or weight on file to calculate BMI.  Orders:  No orders of the defined types were placed in this encounter.  No orders of the defined types were placed in this encounter.    Procedures: No procedures performed  Clinical Data: No additional findings.  ROS:  All other systems negative, except as noted in the HPI. Review of Systems  Objective: Vital Signs: There were no vitals taken for this visit.  Specialty Comments:  No specialty comments available.  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   Genu varum of both lower extremities 03/07/2014   Family history of diabetes mellitus (DM)  01/23/2014   Right knee DJD 01/23/2014   Hyperlipidemia 01/23/2014   HYPERLIPIDEMIA 08/14/2007   DEPRESSION 08/14/2007   GERD 08/14/2007   HEADACHE, CHRONIC 08/14/2007   Past Medical History:  Diagnosis Date   Abnormality of gait 04/26/2014   Anxiety    controls with exercise    Arthritis    knees , shoulders   Bradycardia 05/31/2017   DEPRESSION 08/14/2007   Qualifier: Diagnosis of  By: Bertram Gala     Family history of diabetes mellitus (DM) 01/23/2014   Genu varum of both lower extremities 03/07/2014   GERD 08/14/2007   Qualifier: Diagnosis of  By: Bertram Gala     HEADACHE, CHRONIC 08/14/2007   Qualifier: Diagnosis of  By: Bertram Gala     HYPERLIPIDEMIA  08/14/2007   Qualifier: Diagnosis of  By: Bertram Gala     Indigestion    no medicine at present   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   Neoplasm of appendix    OSTEOARTHRITIS 08/14/2007   Qualifier: Diagnosis of  By: Bertram Gala     Other bilateral secondary osteoarthritis of knee 04/03/2015   PONV (postoperative nausea and vomiting) 1999   partial hysterectomy in [place]   Right knee DJD 01/23/2014   S/P TKR (total knee replacement), bilateral 10/07/2016    Family History  Problem Relation Age of Onset   Diabetes Sister    Diabetes Brother    Diabetes Mother    Diabetes Sister    Diabetes Sister    Diabetes Brother    Diabetes Brother    Colon cancer Neg Hx    Cancer Neg Hx    Rectal cancer Neg Hx     Past Surgical History:  Procedure Laterality Date   ABDOMINAL HYSTERECTOMY     APPENDECTOMY     JOINT REPLACEMENT Bilateral 2016   [place]   LAPAROSCOPIC APPENDECTOMY N/A 05/02/2017   Procedure: LAPAROSCOPIC CECECTOMY;  Surgeon: Jules Husbands, MD;  Location: [facility];  Service: General;  Laterality: N/A;  Please open Lap appy tray.  Have gelport and Right hemicolectomy cart in the room.    LAPAROSCOPIC RIGHT HEMI COLECTOMY Right 09/26/2017   Procedure: LAPAROSCOPIC RIGHT HEMI COLECTOMY;  Surgeon: Jules Husbands, MD;  Location: [facility];  Service: General;  Laterality: Right;   TOTAL ABDOMINAL HYSTERECTOMY W/ BILATERAL SALPINGOOPHORECTOMY  2001   done at [place] Right 04/03/2015   Procedure: RIGHT TOTAL KNEE ARTHROPLASTY;  Surgeon: Leandrew Koyanagi, MD;  Location: [place];  Service: Orthopedics;  Laterality: Right;   TOTAL KNEE ARTHROPLASTY Left 04/03/2015   Procedure: LEFT TOTAL KNEE ARTHROPLASTY;  Surgeon: Leandrew Koyanagi, MD;  Location: [place];  Service: Orthopedics;  Laterality: Left;   VAGINAL DELIVERY     ?x2   Social History   Occupational History   Not on file  Tobacco Use   Smoking status: Never   Smokeless tobacco: Never   Vaping Use   Vaping Use: Never used  Substance and Sexual Activity   Alcohol use: No   Drug use: No   Sexual activity: Not Currently

## 2022-02-11 ENCOUNTER — Telehealth: Payer: Self-pay

## 2022-02-11 NOTE — Telephone Encounter (Signed)
Patient would like handicap placard renewed.  Please advise.CB#  [phone].  Thank you

## 2022-02-11 NOTE — Telephone Encounter (Signed)
Yes permanent.  thanks

## 2022-02-12 ENCOUNTER — Telehealth: Payer: Self-pay | Admitting: Orthopedic Surgery

## 2022-02-12 NOTE — Telephone Encounter (Signed)
Called and let patient know that it is up front.

## 2022-02-12 NOTE — Telephone Encounter (Signed)
Patient needs a new Handicap placard, please advise

## 2022-02-12 NOTE — Telephone Encounter (Signed)
Holding for Dr Sharol Given and [place]

## 2022-02-15 NOTE — Telephone Encounter (Signed)
Pt hasn't been seen for over one year. Please offer her an appointment for f/u for handicap application form. She has also seen Dr. Erlinda Hong for a knee fracture, so if it is her knee, she will need to f/u with Dr. Erlinda Hong for this. Thank you.

## 2023-01-31 ENCOUNTER — Other Ambulatory Visit: Payer: Self-pay

## 2023-01-31 ENCOUNTER — Ambulatory Visit: Payer: Self-pay | Attending: Internal Medicine | Admitting: Internal Medicine

## 2023-01-31 ENCOUNTER — Telehealth: Payer: Self-pay | Admitting: Pharmacist

## 2023-01-31 ENCOUNTER — Encounter: Payer: Self-pay | Admitting: Internal Medicine

## 2023-01-31 VITALS — BP 115/72 | HR 56 | Temp 97.8°F | Ht <= 58 in | Wt 134.0 lb

## 2023-01-31 DIAGNOSIS — E782 Mixed hyperlipidemia: Secondary | ICD-10-CM

## 2023-01-31 DIAGNOSIS — Z0000 Encounter for general adult medical examination without abnormal findings: Secondary | ICD-10-CM

## 2023-01-31 DIAGNOSIS — Z6832 Body mass index (BMI) 32.0-32.9, adult: Secondary | ICD-10-CM

## 2023-01-31 DIAGNOSIS — N898 Other specified noninflammatory disorders of vagina: Secondary | ICD-10-CM

## 2023-01-31 DIAGNOSIS — E66811 Obesity, class 1: Secondary | ICD-10-CM

## 2023-01-31 DIAGNOSIS — Z1231 Encounter for screening mammogram for malignant neoplasm of breast: Secondary | ICD-10-CM

## 2023-01-31 DIAGNOSIS — E6609 Other obesity due to excess calories: Secondary | ICD-10-CM

## 2023-01-31 DIAGNOSIS — I10 Essential (primary) hypertension: Secondary | ICD-10-CM

## 2023-01-31 DIAGNOSIS — Z23 Encounter for immunization: Secondary | ICD-10-CM

## 2023-01-31 DIAGNOSIS — H259 Unspecified age-related cataract: Secondary | ICD-10-CM

## 2023-01-31 DIAGNOSIS — K069 Disorder of gingiva and edentulous alveolar ridge, unspecified: Secondary | ICD-10-CM

## 2023-01-31 MED ORDER — ZOSTER VAC RECOMB ADJUVANTED 50 MCG/0.5ML IM SUSR
0.5000 mL | Freq: Once | INTRAMUSCULAR | 0 refills | Status: AC
  Filled 2023-01-31 – 2023-02-22 (×2): qty 0.5, 1d supply, fill #0

## 2023-01-31 MED ORDER — ESTROGENS CONJUGATED 0.625 MG/GM VA CREA
TOPICAL_CREAM | VAGINAL | 3 refills | Status: DC
  Filled 2023-01-31: qty 60, fill #0
  Filled 2023-01-31: qty 30, 15d supply, fill #0

## 2023-01-31 NOTE — Patient Instructions (Signed)
Alimentacin saludable en los [employer], Adult Una alimentacin saludable puede ayudarlo a Barista y Pharmacologist un peso saludable, reducir el riesgo de tener enfermedades crnicas y vivir Neomia Dear vida larga y productiva. Es importante que siga una modalidad de alimentacin saludable. Sus necesidades nutricionales y calricas deben satisfacerse principalmente con distintos alimentos ricos en nutrientes. Consejos para seguir Surveyor, minerals Lea las etiquetas de los alimentos Lea las etiquetas y elija las que digan lo siguiente: Productos reducidos en sodio o con bajo contenido de [place]. Jugos con 100 % jugo de fruta. Alimentos con bajo contenido de grasas saturadas (menos de 3 g por porcin) y alto contenido de grasas poliinsaturadas y Mining engineer. Alimentos con cereales integrales, como trigo integral, trigo partido, arroz integral y arroz salvaje. Cereales integrales fortificados con cido flico. Esto se recomienda a las mujeres embarazadas o que desean quedar embarazadas. Lea las etiquetas y no coma ni beba lo siguiente: Alimentos o bebidas con azcar agregada. Estos incluyen los alimentos que contienen azcar moreno, endulzante a base de maz, jarabe de maz, dextrosa, fructosa, glucosa, jarabe de maz de alta fructosa, miel, azcar invertido, lactosa, jarabe de [place], maltosa, [place], azcar sin refinar, sacarosa, trehalosa y azcar turbinado. Limite el consumo de azcar agregada a menos del 10 % del total de caloras diarias. No consuma ms que las siguientes cantidades de azcar agregada por da: 6 cucharaditas (25 g) para las mujeres. 9 cucharaditas (38 g) para los hombres. Los alimentos que contienen almidones y cereales refinados o procesados. Los productos de cereales refinados, como harina blanca, harina de maz desgerminada, pan blanco y arroz blanco. Al ir de compras Elija refrigerios ricos en nutrientes, como verduras, frutas enteras y frutos secos. Evite los refrigerios con  alto contenido de caloras y International aid/development worker, como las papas fritas, los refrigerios frutales y los caramelos. Use alios y productos para untar a base de aceite con los Publishing rights manager de grasas slidas como la [place], la [place], la crema agria o el queso crema. Limite las salsas, las mezclas y los productos "instantneos" preelaborados como el arroz saborizado, los fideos instantneos y las pastas listas para comer. Pruebe ms fuentes de protena vegetal, como tofu, tempeh, frijoles negros, edamame, lentejas, frutos secos y semillas. Explore planes de alimentacin como la dieta mediterrnea o la dieta vegetariana. Pruebe salsas cardiosaludables hechas con frijoles y grasas saludables, como hummus y [place]. Las verduras van muy bien con ellas. Al cocinar Use aceite para Designer, multimedia de grasas slidas como [place], margarina o [place]asy de [place]. En lugar de frer, trate de cocinar en el horno, en la plancha o en la parrilla, o hervir los alimentos. Retire la parte grasa de las carnes antes de cocinarlas. Cocine las verduras al vapor en agua o caldo. Planificacin de las comidas  En las comidas, imagine dividir su plato en cuartos: La mitad del plato tiene frutas y verduras. Un cuarto del plato tiene cereales integrales. Un cuarto del plato tiene protena, especialmente carnes [place], aves, huevos, tofu, frijoles o frutos secos. Incluya lcteos descremados en su dieta diaria. Estilo de vida Elija opciones saludables en todos los mbitos, como en el hogar, el [place], la [place], los restaurantes y [place]. Prepare los alimentos de un modo seguro: Lvese las manos despus de manipular carnes crudas. Donde prepare alimentos, mantenga las superficies limpias lavndolas regularmente con agua caliente y [place]. Mantenga las carnes crudas separadas de los alimentos que estn listos para comer como las frutas y las verduras. Cocine los frutos  de mar, carnes, aves y Loss adjuster, chartered la temperatura recomendada. Consiga un termmetro para alimentos. Almacene los alimentos a temperaturas seguras. En general: Mantenga los alimentos fros a una temperatura de 40 F (4,4 C) o inferior. Mantenga los alimentos calientes a una temperatura de 140 F (60 C) o superior. Mantenga el congelador a una temperatura de 0 F (-17,8 C) o inferior. Los alimentos no son seguros para su consumo cuando han estado a una temperatura de entre 40 y 140 F (4.4 y 60 C) por ms de 2 horas. Qu alimentos debo comer? Frutas Propngase comer entre 1 y 2 tazas de frutas frescas, Primary school teacher (en su jugo natural) o Primary school teacher. Una taza de fruta equivale a 1 manzana pequea, 1 banana grande, 8 fresas grandes, 1 taza (237 g) de fruta enlatada,  taza (82 g) de fruta seca o 1 taza (240 ml) de jugo al 100 %. Verduras Propngase comer de 2 a 4 tazas de verduras frescas y Primary school teacher, incluyendo diferentes variedades y colores. Una taza de verduras equivale a 1 taza (91 g) de brcoli o coliflor, 2 zanahorias medianas, 2 tazas (150 g) de verduras de [employer] crudas, 1 tomate grande, 1 pimiento morrn grande, 1 batata grande o 1 patata blanca mediana. Cereales Propngase comer el equivalente a entre 4 y 10 onzas de cereales integrales por Futures trader. Algunos ejemplos de equivalentes a 1 onza de cereales son 1 rebanada de pan, 1 taza (40 g) de cereal listo para comer, 3 tazas (24 g) de palomitas de maz o  taza (93 g) de arroz cocido. Carnes y otras protenas Propngase comer el equivalente a entre 5 y 7  onzas de protena por Futures trader. Algunos ejemplos de equivalentes a 1 onza de protenas incluyen 1 huevo,  oz de frutos secos (12 almendras, 24 pistachos o 7 mitades de nueces), 1/4 taza (90 g) de frijoles cocidos, 6 cucharadas (90 g) de hummus o 1 cucharada (16 g) de [place] de man. Un corte de carne o pescado del tamao de un mazo de cartas equivale aproximadamente a 3 a 4 onzas (85  g). De las protenas que consume cada semana, intente que al menos 8 onzas (227 g) sean frutos de mar. Esto equivale a unas 2 porciones por semana. Esto incluye salmn, trucha, arenque y anchoas. Lcteos [employer] a 3 tazas de lcteos descremados o con bajo contenido de Museum/gallery curator. Algunos ejemplos de equivalentes a 1 taza de lcteos son 1 taza (240 ml) de leche, 8 onzas (250 g) de yogur, 1 onzas (44 g) de queso natural o 1 taza (240 ml) de leche de soja fortificada. Grasas y aceites Propngase consumir alrededor de 5 cucharaditas (21 g) de grasas y Acupuncturist. Elija grasas monoinsaturadas, como el aceite de canola y de [place], la [place] con aceite de [place] o de [place], [place], [place] de man y Games developer de los frutos secos, o bien grasas poliinsaturadas, como el aceite de [place], maz y soja, nueces, piones, semillas de ssamo, semillas de girasol y semillas de lino. Bebidas Propngase beber 6 vasos de 8 onzas de Warehouse manager. Limite el caf a entre 3 y 5 tazas de ocho onzas por Futures trader. Limite el consumo de bebidas con cafena que tengan caloras agregadas, como los refrescos y las bebidas energizantes. Si bebe alcohol: Limite la cantidad que bebe a lo siguiente: De 0 a 1 medida al da si es [place]. De 0 a 2 medidas  al da si es varn. Sepa cunta cantidad de alcohol hay en las bebidas que toma. En los [address], una medida es una botella de cerveza de 12 oz (355 ml), un vaso de vino de 5 oz (148 ml) o un vaso de una bebida alcohlica de alta graduacin de 1 oz (44 ml). Condimentos y otros alimentos Trate de no agregar demasiada sal a los alimentos. Trate de usar hierbas y especias en lugar de sal. Trate de no agregar azcar a los alimentos. Esta informacin se basa en las pautas de nutricin de los [place]. Para obtener ms informacin, visite [email or web address]. Las Information systems manager. Es posible que necesite cantidades  diferentes. Esta informacin no tiene Theme park manager el consejo del mdico. Asegrese de hacerle al mdico cualquier pregunta que tenga. Document Revised: 02/02/2022 Document Reviewed: 02/02/2022 Elsevier Patient Education  2024 [employer].

## 2023-01-31 NOTE — Progress Notes (Signed)
Patient ID: Tina Johnson, female    DOB: July 29, 1965  MRN: [id number]  CC: Annual Exam (Physical. Med refill - vaginal cream (unable to pend)/Requesting referral to dentist for a second opinion/Requesting referral to opthalmologist/No to mammogram referral - will speak to OBGYN)   Subjective: Tina Johnson is a 57 y.o. female who presents for annual exam. 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    [employer] Language interpreter used during this encounter. #Paul [id number]  HM;  had flu vaccine yesterday at church.  Yes to Shingles vaccine. Had COVID booster 1 mth ago at [facility].  Request refill on estrogen vaginal cream for vaginal dryness.  History of hysterectomy with bilateral salpingo-oophorectomy.  She uses it twice a week and has been put on it by GYN.  Request dental referral for 2nd opinion.  Lower teeth at front are loose. Quoted $22,000 to fix; can not afford.   Needs cleaning upper teeth.  Told they could not clean upper because gum bleeds.   Request referral to ophthalmologist.  Was seeing a eye doctor paying out of pocket.  Told she needs cataract surgery but pt feels she does not need it as she still sees well.  Wants 2nd opinion.  HTN: Last time we saw her she was on amlodipine 5 mg daily.  However patient states that she has not been taking it.  Controlling blood pressure through diet and exercise.  HL: Was on Pravachol the last time I saw her.  States she is no longer taking it because it caused back pain.  Will restart if necessary.    Obesity: Weight today is 134 pounds.  Patient states she has been exercising 4 times a week for some time and has completely changed her eating habits.  Reports that weight a few months ago was 142 pounds so she has lost 8 pounds so far. Patient Active Problem List   Diagnosis Date Noted   Essential  hypertension 02/25/2020   Overweight 02/25/2020   S/P right colectomy 09/26/2017   Low grade mucinous neoplasm of appendix    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   Bradycardia 05/31/2017   S/P TKR (total knee replacement), bilateral 10/07/2016   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   Genu varum of both lower extremities 03/07/2014   Family history of diabetes mellitus (DM) 01/23/2014   Right knee DJD 01/23/2014   Hyperlipidemia 01/23/2014   HYPERLIPIDEMIA 08/14/2007   DEPRESSION 08/14/2007   GERD 08/14/2007   Headache 08/14/2007     Current Outpatient Medications on File Prior to Visit  Medication Sig Dispense Refill   CALCIUM PO Take 1 tablet by mouth 2 (two) times daily.      conjugated estrogens (PREMARIN) vaginal cream Place 1 g vaginally 2 (two) times a week. Monday & Thursday     Multiple Vitamin (MULTIVITAMIN WITH MINERALS) TABS tablet Take 1 tablet by mouth 2 (two) times daily.     OMEGA-3 FATTY ACIDS PO Take 1 capsule by mouth 2 (two) times daily.      amLODipine (NORVASC) 5 MG tablet Take 1 tablet (5 mg total) by mouth daily. (Patient not taking: Reported on 01/31/2023) 90 tablet 2   Cholecalciferol (VITAMIN D3) 2000 units TABS Take 2,000 Units by mouth daily.  (Patient not taking: Reported on 01/31/2023)     hydrOXYzine (ATARAX/VISTARIL) 10 MG tablet Take 1 tablet (10 mg total) by  mouth 3 (three) times daily as needed. (Patient not taking: Reported on 01/31/2023) 60 tablet 1   pravastatin (PRAVACHOL) 20 MG tablet Take 0.5 tablets (10 mg total) by mouth daily. 90 tablet 3   No current facility-administered medications on file prior to visit.    Allergies  Allergen Reactions   Norco [Hydrocodone-Acetaminophen] Shortness Of Breath   Latex Rash    Social History   Socioeconomic History   Marital status: Married    Spouse name: Not on file   Number of children: Not on file   Years of education: Not on file   Highest education level: Not on file   Occupational History   Not on file  Tobacco Use   Smoking status: Never   Smokeless tobacco: Never  Vaping Use   Vaping status: Never Used  Substance and Sexual Activity   Alcohol use: No   Drug use: No   Sexual activity: Not Currently  Other Topics Concern   Not on file  Social History Narrative   Not on file   Social Determinants of Health   Financial Resource Strain: High Risk (01/31/2023)   Overall Financial Resource Strain (CARDIA)    Difficulty of Paying Living Expenses: Hard  Food Insecurity: No Food Insecurity (01/31/2023)   Hunger Vital Sign    Worried About Running Out of Food in the Last Year: Never true    Ran Out of Food in the Last Year: Never true  Transportation Needs: No Transportation Needs (01/31/2023)   PRAPARE - Administrator, Civil Service (Medical): No    Lack of Transportation (Non-Medical): No  Physical Activity: Inactive (01/31/2023)   Exercise Vital Sign    Days of Exercise per Week: 0 days    Minutes of Exercise per Session: 0 min  Stress: Stress Concern Present (01/31/2023)   [employer] of Occupational Health - Occupational Stress Questionnaire    Feeling of Stress : Rather much  Social Connections: Socially Integrated (01/31/2023)   Social Connection and Isolation Panel [NHANES]    Frequency of Communication with Friends and Family: Twice a week    Frequency of Social Gatherings with Friends and Family: Once a week    Attends Religious Services: 1 to 4 times per year    Active Member of [employer] or Organizations: Yes    Attends Engineer, structural: More than 4 times per year    Marital Status: Married  Catering manager Violence: Not At Risk (01/31/2023)   Humiliation, Afraid, Rape, and Kick questionnaire    Fear of Current or Ex-Partner: No    Emotionally Abused: No    Physically Abused: No    Sexually Abused: No    Family History  Problem Relation Age of Onset   Diabetes Sister    Diabetes Brother     Diabetes Mother    Diabetes Sister    Diabetes Sister    Diabetes Brother    Diabetes Brother    Colon cancer Neg Hx    Cancer Neg Hx    Rectal cancer Neg Hx     Past Surgical History:  Procedure Laterality Date   ABDOMINAL HYSTERECTOMY     APPENDECTOMY     JOINT REPLACEMENT Bilateral 2016   [facility]   LAPAROSCOPIC APPENDECTOMY N/A 05/02/2017   Procedure: LAPAROSCOPIC CECECTOMY;  Surgeon: Leafy Ro, MD;  Location: [facility] ORS;  Service: General;  Laterality: N/A;  Please open Lap appy tray.  Have gelport and Right hemicolectomy cart in the  room.    LAPAROSCOPIC RIGHT HEMI COLECTOMY Right 09/26/2017   Procedure: LAPAROSCOPIC RIGHT HEMI COLECTOMY;  Surgeon: Leafy Ro, MD;  Location: [facility] OR[facility];  Service: General;  Laterality: Right;   TOTAL ABDOMINAL HYSTERECTOMY W/ BILATERAL SALPINGOOPHORECTOMY  2001   done at [facility] hosp   TOTAL KNEE ARTHROPLASTY Right 04/03/2015   Procedure: RIGHT TOTAL KNEE ARTHROPLASTY;  Surgeon: Tarry Kos, MD;  Location: [facility] OR;  Service: Orthopedics;  Laterality: Right;   TOTAL KNEE ARTHROPLASTY Left 04/03/2015   Procedure: LEFT TOTAL KNEE ARTHROPLASTY;  Surgeon: Tarry Kos, MD;  Location: [facility] OR;  Service: Orthopedics;  Laterality: Left;   VAGINAL DELIVERY     ?x2    ROS: Review of Systems  HENT:  Negative for hearing loss and sore throat.   Respiratory:  Negative for shortness of breath.   Cardiovascular:  Negative for chest pain.  Gastrointestinal:  Negative for abdominal distention.    PHYSICAL EXAM: BP 115/72 (BP Location: Left Arm, Patient Position: Sitting, Cuff Size: Normal)   Pulse (!) 56   Temp 97.8 F (36.6 C) (Oral)   Ht 4\' 6"  (1.372 m)   Wt 134 lb (60.8 kg)   SpO2 96%   BMI 32.31 kg/m   Wt Readings from Last 3 Encounters:  01/31/23 134 lb (60.8 kg)  12/02/20 127 lb 9.6 oz (57.9 kg)  02/28/20 123 lb (55.8 kg)    Physical Exam  General appearance - alert, well appearing, and in no distress Mental status - normal  mood, behavior, speech, dress, motor activity, and thought processes Eyes - pupils equal and reactive, extraocular eye movements intact Ears - bilateral TM's and external ear canals normal Nose - normal and patent, no erythema, discharge or polyps Mouth - mucous membranes moist, pharynx normal without lesions.  Teeth at the front of the mouth lower jaw has  significantly receded gum lines. Neck - supple, no significant adenopathy Lymphatics - no palpable lymphadenopathy, no hepatosplenomegaly Chest - clear to auscultation, no wheezes, rales or rhonchi, symmetric air entry Heart - normal rate, regular rhythm, normal S1, S2, no murmurs, rubs, clicks or gallops Abdomen - soft, nontender, nondistended, no masses or organomegaly Musculoskeletal - no joint tenderness, deformity or swelling Extremities - peripheral pulses normal, no pedal edema, no clubbing or cyanosis Skin - normal coloration and turgor, no rashes, no suspicious skin lesions noted     01/31/2023   10:26 AM 12/02/2020    9:19 AM 02/25/2020    3:10 PM  Depression screen PHQ 2/9  Decreased Interest 0 0 0  Down, Depressed, Hopeless 0 0 0  PHQ - 2 Score 0 0 0  Altered sleeping 0  0  Tired, decreased energy 0  0  Change in appetite 0  0  Feeling bad or failure about yourself  0  0  Trouble concentrating 0  0  Moving slowly or fidgety/restless 0  0  Suicidal thoughts 0  0  PHQ-9 Score 0  0  Difficult doing work/chores Not difficult at all        01/31/2023   10:26 AM 12/02/2020    9:19 AM 02/25/2020    3:10 PM 04/27/2018    2:37 PM  GAD 7 : Generalized Anxiety Score  Nervous, Anxious, on Edge 0 0 0 --  Control/stop worrying 0 0 0 --  Worry too much - different things 1 0 0 --  Trouble relaxing 0 0 0 --  Restless 0 0 0 --  Easily annoyed  or irritable 0 0 0 --  Afraid - awful might happen 1 0 0 --  Total GAD 7 Score 2 0 0   Anxiety Difficulty Somewhat difficult Not difficult at all          Latest Ref Rng & Units  12/02/2020   10:02 AM 04/19/2020    2:43 AM 02/25/2020    3:56 PM  CMP  Glucose 65 - 99 mg/dL 829  562  85   BUN 6 - 24 mg/dL 14  19  21    Creatinine 0.57 - 1.00 mg/dL 1.30  8.65  7.84   Sodium 134 - 144 mmol/L 140  143  137   Potassium 3.5 - 5.2 mmol/L 4.7  3.6  4.2   Chloride 96 - 106 mmol/L 103  106  99   CO2 20 - 29 mmol/L 22  27  27    Calcium 8.7 - 10.2 mg/dL 9.7  9.3  9.7   Total Protein 6.0 - 8.5 g/dL 6.8   7.4   Total Bilirubin 0.0 - 1.2 mg/dL 0.4   0.5   Alkaline Phos 44 - 121 IU/L 82   87   AST 0 - 40 IU/L 27   21   ALT 0 - 32 IU/L 22   17    Lipid Panel     Component Value Date/Time   CHOL 231 (H) 12/02/2020 1002   TRIG 247 (H) 12/02/2020 1002   HDL 65 12/02/2020 1002   CHOLHDL 3.6 12/02/2020 1002   CHOLHDL 3.8 01/21/2016 0921   VLDL 20 01/21/2016 0921   LDLCALC 123 (H) 12/02/2020 1002    CBC    Component Value Date/Time   WBC 7.1 12/02/2020 1002   WBC 7.5 04/19/2020 0243   RBC 4.85 12/02/2020 1002   RBC 4.52 04/19/2020 0243   HGB 14.9 12/02/2020 1002   HCT 44.9 12/02/2020 1002   PLT 247 12/02/2020 1002   MCV 93 12/02/2020 1002   MCH 30.7 12/02/2020 1002   MCH 31.2 04/19/2020 0243   MCHC 33.2 12/02/2020 1002   MCHC 33.6 04/19/2020 0243   RDW 13.1 12/02/2020 1002   LYMPHSABS 2.5 12/02/2020 1002   MONOABS 0.4 09/30/2017 0457   EOSABS 0.1 12/02/2020 1002   BASOSABS 0.0 12/02/2020 1002    ASSESSMENT AND PLAN: 1. Annual physical exam - CBC - Comprehensive metabolic panel - Lipid panel  2. Class 1 obesity due to excess calories with serious comorbidity and body mass index (BMI) of 32.0 to 32.9 in adult Commended her on weight loss so far. Commended her on changes in her eating habits.  Printed information given on healthy eating habits. Encouraged her to continue regular exercise.  3. Essential hypertension At goal.  Will hold off on restarting Norvasc. - CBC - Comprehensive metabolic panel - Lipid panel  4. Mixed hyperlipidemia We will plan to  check lipid profile today.  Hopefully it has improved.  She had stopped statin because of back pain with the medicine.  5. Vaginal dryness - conjugated estrogens (PREMARIN) vaginal cream; Place 1 g vaginally 2 (two) times a week. Monday & Thursday  Dispense: 30 g; Refill: 3  6. Gum disease - Ambulatory referral to Dentistry  7. Age-related cataract of both eyes, unspecified age-related cataract type Advised patient that we do not have a discount program for optometry ophthalmology.  She may have to pay out-of-pocket to get her second opinion.  8. Encounter for screening mammogram for malignant neoplasm of breast - MM Digital  Screening; Future  9. Need for shingles vaccine Printed prescription given for her to take to our pharmacy for Shingrix. - Zoster Vaccine Adjuvanted [employer]) injection; Inject 0.5 mLs into the muscle once for 1 dose.  Dispense: 0.5 mL; Refill: 0  Patient was given the opportunity to ask questions.  Patient verbalized understanding of the plan and was able to repeat key elements of the plan.   This documentation was completed using Paediatric nurse.  Any transcriptional errors are unintentional.  No orders of the defined types were placed in this encounter.    Requested Prescriptions   Pending Prescriptions Disp Refills   amLODipine (NORVASC) 5 MG tablet 90 tablet 2    Sig: Take 1 tablet (5 mg total) by mouth daily.   pravastatin (PRAVACHOL) 20 MG tablet 90 tablet 3    Sig: Take 0.5 tablets (10 mg total) by mouth daily.    No follow-ups on file.  Jonah Blue, MD, FACP

## 2023-01-31 NOTE — Telephone Encounter (Signed)
Received note from pt's pharmacy that premarin cream is not available d/t cost. They do have intravaginal generic estradiol 0.1mg /gm strength. Can this be used instead?

## 2023-02-01 ENCOUNTER — Other Ambulatory Visit: Payer: Self-pay | Admitting: Internal Medicine

## 2023-02-01 ENCOUNTER — Other Ambulatory Visit: Payer: Self-pay

## 2023-02-01 LAB — LIPID PANEL
Chol/HDL Ratio: 4.1 {ratio} (ref 0.0–4.4)
Cholesterol, Total: 239 mg/dL — ABNORMAL HIGH (ref 100–199)
HDL: 58 mg/dL (ref >39)
LDL Chol Calc (NIH): 160 mg/dL — ABNORMAL HIGH (ref 0–99)
Triglycerides: 117 mg/dL (ref 0–149)
VLDL Cholesterol Cal: 21 mg/dL (ref 5–40)

## 2023-02-01 LAB — COMPREHENSIVE METABOLIC PANEL
ALT: 20 [IU]/L (ref 0–32)
AST: 25 [IU]/L (ref 0–40)
Albumin: 4.5 g/dL (ref 3.8–4.9)
Alkaline Phosphatase: 90 [IU]/L (ref 44–121)
BUN/Creatinine Ratio: 26 — ABNORMAL HIGH (ref 9–23)
BUN: 16 mg/dL (ref 6–24)
Bilirubin Total: 0.5 mg/dL (ref 0.0–1.2)
CO2: 23 mmol/L (ref 20–29)
Calcium: 9.6 mg/dL (ref 8.7–10.2)
Chloride: 103 mmol/L (ref 96–106)
Creatinine, Ser: 0.61 mg/dL (ref 0.57–1.00)
Globulin, Total: 3.1 g/dL (ref 1.5–4.5)
Glucose: 93 mg/dL (ref 70–99)
Potassium: 4.9 mmol/L (ref 3.5–5.2)
Sodium: 140 mmol/L (ref 134–144)
Total Protein: 7.6 g/dL (ref 6.0–8.5)
eGFR: 103 mL/min/{1.73_m2} (ref >59)

## 2023-02-01 LAB — CBC
Hematocrit: 46.6 % (ref 34.0–46.6)
Hemoglobin: 15.4 g/dL (ref 11.1–15.9)
MCH: 30.9 pg (ref 26.6–33.0)
MCHC: 33 g/dL (ref 31.5–35.7)
MCV: 94 fL (ref 79–97)
Platelets: 279 10*3/uL (ref 150–450)
RBC: 4.98 x10E6/uL (ref 3.77–5.28)
RDW: 12.9 % (ref 11.7–15.4)
WBC: 7.8 10*3/uL (ref 3.4–10.8)

## 2023-02-01 MED ORDER — ESTRADIOL 0.1 MG/GM VA CREA
1.0000 g | TOPICAL_CREAM | VAGINAL | 3 refills | Status: DC
  Filled 2023-02-01: qty 42.5, 150d supply, fill #0
  Filled 2023-06-27: qty 42.5, 150d supply, fill #1
  Filled 2023-08-18 – 2023-11-10 (×2): qty 42.5, 150d supply, fill #2

## 2023-02-02 ENCOUNTER — Other Ambulatory Visit: Payer: Self-pay | Admitting: Internal Medicine

## 2023-02-02 DIAGNOSIS — E782 Mixed hyperlipidemia: Secondary | ICD-10-CM

## 2023-02-11 ENCOUNTER — Other Ambulatory Visit: Payer: Self-pay

## 2023-02-14 ENCOUNTER — Other Ambulatory Visit: Payer: Self-pay

## 2023-02-18 ENCOUNTER — Other Ambulatory Visit: Payer: Self-pay

## 2023-02-22 ENCOUNTER — Other Ambulatory Visit: Payer: Self-pay

## 2023-02-22 ENCOUNTER — Telehealth: Payer: Self-pay

## 2023-02-22 NOTE — Telephone Encounter (Signed)
Telephoned patient at mobile number using interpreter, Tina Johnson. Mailed patient mammogram scholarship application.

## 2023-02-24 ENCOUNTER — Other Ambulatory Visit: Payer: Self-pay

## 2023-03-11 ENCOUNTER — Other Ambulatory Visit: Payer: Self-pay

## 2023-03-23 ENCOUNTER — Other Ambulatory Visit: Payer: Self-pay

## 2023-04-21 ENCOUNTER — Encounter: Payer: Self-pay | Attending: Internal Medicine | Admitting: Skilled Nursing Facility1

## 2023-04-21 ENCOUNTER — Encounter: Payer: Self-pay | Admitting: Skilled Nursing Facility1

## 2023-04-21 VITALS — Ht <= 58 in | Wt 133.7 lb

## 2023-04-21 DIAGNOSIS — E785 Hyperlipidemia, unspecified: Secondary | ICD-10-CM | POA: Insufficient documentation

## 2023-04-21 NOTE — Progress Notes (Signed)
 Medical Nutrition Therapy  Appointment Start time:  3:14  Appointment End time:  4:20  Primary concerns today: to lose weight   Referral diagnosis: Mixed Hyperlipidemia  Preferred learning style: auditory, visual Learning readiness:  contemplating   NUTRITION ASSESSMENT  Interpteor: Loleta Revering with cone   Clinical Medical Hx: hyperlipidemia  Medications: see list Labs: total cholesterol 239, LDL 160 Notable Signs/Symptoms: none reported   Lifestyle & Dietary Hx  Pt arrives with her sister whom is supportive.  Pt states she never has really had a usual weight stating it typically goes up and down. Pt states she wants to be 110 pounds again.  Pt states she has a lot of anxiety leading to depression sometimes. Pt states she works part time at [employer].   Estimated daily fluid intake: 45 oz Supplements: herbalife products  Sleep: sleeping about 8 hours waking feeling well rested  Stress / self-care: high with anxiety  Current average weekly physical activity: 1-4 days a week (inconsistent)   24-Hr Dietary Recall: wakes at 7am First Meal 8:30-9: herbalife shake and herbalife tea and herbalife doughnut or sweet bread and tea Snack:  Second Meal: beans or 8 tortillas or chicken soup or 2 meat tamales and 2 sopas  Snack:  Third Meal: half herbalife shake Snack: soda and chips Beverages: cinnomon tea, soda, water   Estimated Energy Needs Calories: 1200-1600   NUTRITION DIAGNOSIS  The patient has a food and nutrition related knowledge deficit related to her uncertainty of how to apply nutrition information she already knows. This is evident by her over consumption of high fat and sugar foods Additionally, the participant reported lack of knowledge related to how to apply food and nutrition related information.    NUTRITION INTERVENTION  Nutrition education (E-1) on the following topics:  Choose Healthy Fats Fats are essential in the diet, but the type of fat you consume is  critical for managing cholesterol/triglyceride levels. Good Fats (Unsaturated Fats) Monounsaturated fats: Found in olive oil, avocados, nuts (almonds, walnuts, cashews), and seeds. Polyunsaturated fats: Found in fatty fish (salmon, mackerel, sardines), flaxseeds, chia seeds, walnuts, and plant-based oils like soybean, sunflower, and corn oil. These fats can help lower LDL (bad cholesterol) and increase HDL (good cholesterol). Limit Saturated Fats Found in red meat, butter, full-fat dairy products, and tropical oils (coconut oil, palm oil). Excessive saturated fat intake can raise LDL cholesterol levels. Avoid Trans Fats Found in partially hydrogenated oils, margarine, and many processed and fried foods. Trans fats significantly increase the risk of heart disease and should be avoided. 2. Increase Fiber Intake Fiber helps lower cholesterol levels, especially soluble fiber, which binds to cholesterol in the digestive system and helps excrete it from the body. High-Fiber Foods to Include Fruits: Apples, pears, berries, oranges, and bananas. Vegetables: Broccoli, Brussels sprouts, carrots, sweet potatoes, and leafy greens. Whole grains: Oats, barley, quinoa, brown rice, and whole wheat products. Legumes: Beans, lentils, chickpeas, and peas. Nuts and seeds: Almonds, flaxseeds, chia seeds, and walnuts. Aim for 25-30 grams of fiber per day. 3. Incorporate Plant Sterols and Stanols Plant sterols and stanols are naturally occurring substances found in plant-based foods that can help lower LDL cholesterol. They block the absorption of cholesterol in the intestines. Foods Rich in Sterols and Stanols Fortified foods like margarine, orange juice, and yogurt. Nuts and seeds, particularly sunflower seeds and sesame seeds. Whole grains, fruits, and vegetables. 4. Consume Omega-3 Fatty Acids Omega-3 fatty acids are beneficial for heart health as they help reduce triglycerides and  lower the risk of heart  disease.  Omega-3 Sources Fatty fish: Salmon, mackerel, sardines, trout, and herring. Flaxseeds and chia seeds. Walnuts. Plant oils: Canola oil, soybean oil. Aim for 2-3 servings of fatty fish per week or consider adding a daily supplement of 1,000-2,000 mg of EPA and DHA (the active forms of omega-3).  5. Limit Refined Carbohydrates and Sugary Foods Eating foods high in refined carbohydrates and added sugars can increase triglyceride levels and contribute to the development of insulin resistance. 9. Focus on Portion Control and Caloric Balance Managing weight is important for controlling hyperlipidemia. Being overweight or obese can lead to higher cholesterol and triglyceride levels. Practice portion control and avoid overeating. Limit high-calorie snacks and choose healthier options like fresh fruit, nuts, or whole-grain crackers. Aiming for gradual, sustainable weight loss (1-2 pounds per week) can improve lipid levels and overall health. A small serving of fresh fruit or a handful of dark chocolate (70% or higher). Summary of Key Dietary Tips for Hyperlipidemia: Choose healthy fats (unsaturated fats from plants and fish). Increase fiber from fruits, vegetables, whole grains, and legumes. Include omega-3 fatty acids from fatty fish, flaxseeds, and walnuts. Limit saturated fats and avoid trans fats found in processed foods. Control portion sizes to maintain a healthy weight. Reduce refined carbohydrates and sugars to manage triglyceride levels. Stay active: Regular physical activity (at least 150 minutes per week) helps improve lipid profiles.   Handouts Provided Include  In spanish: what is hyperlipidemia, balanced MyPlate, list of foods to avoid and foods to eat  Learning Style & Readiness for Change Teaching method utilized: Visual & Auditory  Demonstrated degree of understanding via: Teach Back  Barriers to learning/adherence to lifestyle change: spanish as the primary language    Goals Established by Pt I will be more active starting with the gym one day a week I will eat 3 meals a day eating every 3-5 hours  I will reduce my portions of tortilla to 2 rather than 8   MONITORING & EVALUATION Dietary intake, weekly physical activity, fat and sugar intake   Next Steps  Patient is to come back in center for follow up in 1 month.

## 2023-06-01 ENCOUNTER — Ambulatory Visit: Payer: Self-pay | Admitting: Skilled Nursing Facility1

## 2023-06-27 ENCOUNTER — Other Ambulatory Visit: Payer: Self-pay

## 2023-06-28 ENCOUNTER — Other Ambulatory Visit: Payer: Self-pay

## 2023-07-06 ENCOUNTER — Other Ambulatory Visit: Payer: Self-pay

## 2023-07-06 ENCOUNTER — Encounter: Payer: Self-pay | Admitting: Orthopaedic Surgery

## 2023-07-06 ENCOUNTER — Ambulatory Visit: Payer: Self-pay | Admitting: Orthopaedic Surgery

## 2023-07-06 DIAGNOSIS — M545 Low back pain, unspecified: Secondary | ICD-10-CM

## 2023-07-06 DIAGNOSIS — G8929 Other chronic pain: Secondary | ICD-10-CM

## 2023-07-06 MED ORDER — TIZANIDINE HCL 4 MG PO TABS
4.0000 mg | ORAL_TABLET | Freq: Four times a day (QID) | ORAL | 0 refills | Status: AC | PRN
Start: 2023-07-06 — End: ?

## 2023-07-06 NOTE — Progress Notes (Addendum)
 Office Visit Note   Patient: Tina Johnson           Date of Birth: [birth year]           MRN: [id number] Visit Date: 07/06/2023              Requested by: Marcine Matar, MD [address] [place] [address] [place],  [place] [address] PCP: Marcine Matar, MD   Assessment & Plan: Visit Diagnoses:  1. 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  At this time, patient's pain is all muscular in nature.  Will go ahead and do physical therapy for scapular dyskinesis, hip abduction strengthening as well as lumbar hyperextension strengthening.  Patient also noted to have some hypertonicity in the lumbar paraspinal area, we will go ahead and do Zanaflex for that.  Patient advised to follow-up in 6 to 8 weeks if minimal improvement  Follow-Up Instructions: No follow-ups on file.   Orders:  Orders Placed This Encounter  Procedures   XR Lumbar Spine 2-3 Views   No orders of the defined types were placed in this encounter.     Procedures: No procedures performed   Clinical Data: No additional findings.   Subjective: Chief Complaint  Patient presents with   Lower Back - Pain    Patient is presenting with bilateral lower back pain as well as bilateral upper back pain.  Patient states that she has been exercising but notes that her lower back hurts from time to time.  Patient states that the exercise definitely helped but then she was still noticed the back pain.  Patient states every now she has pain in her lumbar area as well as in her scapular area.  Patient notes the pain feels better when she rolls her scapula forward and worsens whenever she tries to roll her scapula backwards.  Patient denies any radicular symptoms.  No numbness tingling in the lower extremities.  No weakness in the lower  extremities.    Review of Systems   Objective: Vital Signs: There were no vitals taken for this visit.  Physical Exam  Ortho Exam There is some tenderness to palpation over the lumbar paraspinal area.  No tenderness over the spinous processes themselves.  There is some noted tenderness over the bilateral scapula medial borders.  Forward rolling of the scapular improves pain bilaterally.  Range of motion is full of the lumbar spine with forward flexion and extension Specialty Comments:  No specialty comments available.  Imaging: No results found.   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   Genu varum of both lower extremities 03/07/2014   Family history of diabetes mellitus (DM) 01/23/2014   Right knee DJD 01/23/2014   Hyperlipidemia 01/23/2014   HYPERLIPIDEMIA 08/14/2007   DEPRESSION 08/14/2007   GERD 08/14/2007   Headache 08/14/2007   Past Medical History:  Diagnosis Date   Abnormality of gait 04/26/2014   Anxiety    controls with exercise    Arthritis    knees ,  shoulders   Bradycardia 05/31/2017   DEPRESSION 08/14/2007   Qualifier: Diagnosis of  By: Davonna Belling     Family history of diabetes mellitus (DM) 01/23/2014   Genu varum of both lower extremities 03/07/2014   GERD 08/14/2007   Qualifier: Diagnosis of  By: Davonna Belling     HEADACHE, CHRONIC 08/14/2007   Qualifier: Diagnosis of  By: Davonna Belling     HYPERLIPIDEMIA 08/14/2007   Qualifier: Diagnosis of  By: Abran Richard, Michele     Indigestion    no medicine at present   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   Neoplasm of appendix    OSTEOARTHRITIS  08/14/2007   Qualifier: Diagnosis of  By: Davonna Belling     Other bilateral secondary osteoarthritis of knee 04/03/2015   PONV (postoperative nausea and vomiting) 1999   partial hysterectomy in [place]   Right knee DJD 01/23/2014   S/P TKR (total knee replacement), bilateral 10/07/2016    Family History  Problem Relation Age of Onset   Diabetes Sister    Diabetes Brother    Diabetes Mother    Diabetes Sister    Diabetes Sister    Diabetes Brother    Diabetes Brother    Colon cancer Neg Hx    Cancer Neg Hx    Rectal cancer Neg Hx     Past Surgical History:  Procedure Laterality Date   ABDOMINAL HYSTERECTOMY     APPENDECTOMY     JOINT REPLACEMENT Bilateral 2016   [place]   LAPAROSCOPIC APPENDECTOMY N/A 05/02/2017   Procedure: LAPAROSCOPIC CECECTOMY;  Surgeon: Leafy Ro, MD;  Location: [facility];  Service: General;  Laterality: N/A;  Please open Lap appy tray.  Have gelport and Right hemicolectomy cart in the room.    LAPAROSCOPIC RIGHT HEMI COLECTOMY Right 09/26/2017   Procedure: LAPAROSCOPIC RIGHT HEMI COLECTOMY;  Surgeon: Leafy Ro, MD;  Location: [facility];  Service: General;  Laterality: Right;   TOTAL ABDOMINAL HYSTERECTOMY W/ BILATERAL SALPINGOOPHORECTOMY  2001   done at [facility] hosp   TOTAL KNEE ARTHROPLASTY Right 04/03/2015   Procedure: RIGHT TOTAL KNEE ARTHROPLASTY;  Surgeon: Tarry Kos, MD;  Location: [facility];  Service: Orthopedics;  Laterality: Right;   TOTAL KNEE ARTHROPLASTY Left 04/03/2015   Procedure: LEFT TOTAL KNEE ARTHROPLASTY;  Surgeon: Tarry Kos, MD;  Location: [facility] OR;  Service: Orthopedics;  Laterality: Left;   VAGINAL DELIVERY     ?x2   Social History   Occupational History   Not on file  Tobacco Use   Smoking status: Never   Smokeless tobacco: Never  Vaping Use   Vaping status: Never Used  Substance and Sexual Activity   Alcohol use: No   Drug use: No   Sexual activity: Not Currently

## 2023-07-26 ENCOUNTER — Ambulatory Visit: Payer: Self-pay | Attending: Family Medicine | Admitting: Physical Therapy

## 2023-07-26 ENCOUNTER — Other Ambulatory Visit: Payer: Self-pay

## 2023-07-26 DIAGNOSIS — M546 Pain in thoracic spine: Secondary | ICD-10-CM | POA: Insufficient documentation

## 2023-07-26 DIAGNOSIS — G8929 Other chronic pain: Secondary | ICD-10-CM | POA: Insufficient documentation

## 2023-07-26 DIAGNOSIS — M5442 Lumbago with sciatica, left side: Secondary | ICD-10-CM | POA: Insufficient documentation

## 2023-07-26 DIAGNOSIS — M6281 Muscle weakness (generalized): Secondary | ICD-10-CM | POA: Insufficient documentation

## 2023-07-26 DIAGNOSIS — M545 Low back pain, unspecified: Secondary | ICD-10-CM | POA: Insufficient documentation

## 2023-07-26 NOTE — Therapy (Signed)
 OUTPATIENT PHYSICAL THERAPY THORACOLUMBAR EVALUATION   Patient Name: Tina Johnson MRN: [id number] DOB:Dec 06, 1965, 58 y.o., female Today's Date: 07/26/2023  END OF SESSION:  PT End of Session - 07/26/23 1324     Visit Number 1    Number of Visits 12    Date for PT Re-Evaluation 09/06/23    Authorization Type CAFA    PT Start Time 1105    PT Stop Time 1145    PT Time Calculation (min) 40 min    Activity Tolerance Patient limited by pain;Patient tolerated treatment well    Behavior During Therapy [place] for tasks assessed/performed             Past Medical History:  Diagnosis Date   Abnormality of gait 04/26/2014   Anxiety    controls with exercise    Arthritis    knees , shoulders   Bradycardia 05/31/2017   DEPRESSION 08/14/2007   Qualifier: Diagnosis of  By: Davonna Belling     Family history of diabetes mellitus (DM) 01/23/2014   Genu varum of both lower extremities 03/07/2014   GERD 08/14/2007   Qualifier: Diagnosis of  By: Davonna Belling     HEADACHE, CHRONIC 08/14/2007   Qualifier: Diagnosis of  By: Davonna Belling     HYPERLIPIDEMIA 08/14/2007   Qualifier: Diagnosis of  By: Abran Richard, Michele     Indigestion    no medicine at present   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   Neoplasm of appendix    OSTEOARTHRITIS 08/14/2007   Qualifier: Diagnosis of  By: Davonna Belling     Other bilateral secondary osteoarthritis of knee 04/03/2015   PONV (postoperative nausea and vomiting) 1999   partial hysterectomy in [place]   Right knee DJD 01/23/2014   S/P TKR (total knee replacement), bilateral 10/07/2016   Past Surgical History:  Procedure Laterality Date   ABDOMINAL HYSTERECTOMY     APPENDECTOMY     JOINT REPLACEMENT Bilateral 2016   [place]   LAPAROSCOPIC APPENDECTOMY N/A 05/02/2017   Procedure: LAPAROSCOPIC CECECTOMY;  Surgeon: Leafy Ro, MD;  Location: [facility];  Service: General;  Laterality: N/A;  Please open Lap appy  tray.  Have gelport and Right hemicolectomy cart in the room.    LAPAROSCOPIC RIGHT HEMI COLECTOMY Right 09/26/2017   Procedure: LAPAROSCOPIC RIGHT HEMI COLECTOMY;  Surgeon: Leafy Ro, MD;  Location: [facility];  Service: General;  Laterality: Right;   TOTAL ABDOMINAL HYSTERECTOMY W/ BILATERAL SALPINGOOPHORECTOMY  2001   done at [facility] hosp   TOTAL KNEE ARTHROPLASTY Right 04/03/2015   Procedure: RIGHT TOTAL KNEE ARTHROPLASTY;  Surgeon: Tarry Kos, MD;  Location: [facility];  Service: Orthopedics;  Laterality: Right;   TOTAL KNEE ARTHROPLASTY Left 04/03/2015   Procedure: LEFT TOTAL KNEE ARTHROPLASTY;  Surgeon: Tarry Kos, MD;  Location: [facility];  Service: Orthopedics;  Laterality: Left;   VAGINAL DELIVERY     ?x2   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   Bradycardia 05/31/2017   S/P TKR (total knee replacement), bilateral 10/07/2016   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   Genu varum of both lower extremities 03/07/2014   Family history of diabetes mellitus (DM) 01/23/2014   Right knee DJD 01/23/2014   Hyperlipidemia 01/23/2014  HYPERLIPIDEMIA 08/14/2007   DEPRESSION 08/14/2007   GERD 08/14/2007   Headache 08/14/2007    PCP: Marcine Matar, MD   REFERRING PROVIDER: Brenton Grills, MD   REFERRING DIAG: [phone] (ICD-10-CM) - Chronic low back pain, unspecified back pain laterality, unspecified whether sciatica present   Rationale for Evaluation and Treatment: Rehabilitation  THERAPY DIAG:  Chronic left-sided low back pain with left-sided sciatica  Pain in thoracic spine  Muscle weakness (generalized)  ONSET DATE: onset 4 years ago but more recently exacerbating pain in last 2 months  SUBJECTIVE:                                                                                                                                                                                            SUBJECTIVE STATEMENT: Through interpreter, pt explains she has had pain for about 4 years but she did exercise and it helped her pain but in the last 2 months it has gotten worse and exercise don't help. After 2 hours of sitting pain begins.  Pain is worse when she is sitting  but walking is better for pain. Stand is OK but she is having more pain down her left side. She went the whole day mostly standing and last night when she was lying down and husband massaging her left lower leg the pain went away in her back. 4 years ago her back pain started with no incident.  More in low back and upper back  PERTINENT HISTORY:  OA bil knees, GERD, hyperlipidemia, TKA bil for genu knee varus. Dec 2015 bil, appy,   PAIN:  Are you having pain? Yes: NPRS scale: at rest 7/10 now and at worst 10/10  Pain location: thoracic  and low back Pain description: spasming achy pain Aggravating factors: lifting household items like appliances to make tamales  about 14-15 pounds, sometimes disrupts sleep Relieving factors: movement makes better  PRECAUTIONS: None  RED FLAGS: None   WEIGHT BEARING RESTRICTIONS: No  FALLS:  Has patient fallen in last 6 months? 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  DIAGNOSTIC FINDINGS:  2 view x-rays of the lumbar spine show lordosis of the lumbar spine, there  is some noted retrolisthesis of L4-L5.  Some noted spurring throughout the  lumbar spine and  mild sclerotic changes.  Disc height does appear  to be  narrower at L4-L5 and L1-L2.  Patient also noted to have some mild  thoracic disc height narrowing  See medical records for x rays  PATIENT SURVEYS:  Modified Oswestry 30%   COGNITION: Overall cognitive status: Within functional limits for tasks assessed     SENSATION: WFL  MUSCLE LENGTH: Hamstrings: slight tightness L > R   POSTURE: rounded shoulders, forward head, and pelvic level higher on left side  PALPATION: Pt with TTP over thoracic lumbar paraspinals  LUMBAR ROM:   AROM eval  Flexion Fingertips to ankles *  Extension 50% decrease *  Right lateral flexion WFL  Left lateral flexion WFL  Right rotation 50%  Left rotation 50%  Key: WFL = within functional limits not formally assessed, * = concordant pain, s = stiffness/stretching sensation, NT = not tested)  LOWER EXTREMITY ROM:     Active  Right eval Left eval  Hip flexion 70standing 70 standing *  Hip extension    Hip abduction    Hip adduction    Hip internal rotation    Hip external rotation    Knee flexion    Knee extension    Ankle dorsiflexion    Ankle plantarflexion    Ankle inversion    Ankle eversion    (Blank rows = not tested, score listed is out of 5 possible points.  N = WNL, D = diminished, C = clear for gross weakness with myotome testing, * = concordant pain with testing)   LOWER EXTREMITY MMT:    MMT Right eval Left eval  Hip flexion 4+ 4+  Hip extension 4 4  Hip abduction 4 4-  Hip adduction    Hip internal rotation    Hip external rotation    Knee flexion 4+ 4+  Knee extension 4+ 4-  Ankle dorsiflexion    Ankle plantarflexion    Ankle inversion    Ankle eversion     (Blank rows = not tested)  LUMBAR SPECIAL TESTS:  Straight leg raise test: Negative, Slump test: Negative, and FABER test: Negative But pt with limited ER bil FUNCTIONAL TESTS:  5 times sit to stand: 11.26 sec 2 minute walk test: TBD  GAIT: Distance walked: 150 to clinic Assistive device  utilized: None Level of assistance: Complete Independence Comments: Pt with  slight antalgic gait disturbance  TREATMENT DATE: eval and issue HEP                                                                                                                                 PATIENT EDUCATION:  Education details: POC Explanation of findings issue HEP Person educated: Patient  INterpreter Education method: Explanation, Demonstration, Tactile cues, Verbal cues, and Handouts Education comprehension: verbalized understanding, returned demonstration, verbal cues required, tactile cues required, and needs further education  HOME EXERCISE PROGRAM: Access Code: [email or web address] URL: [email or web address] Date: 07/26/2023 Prepared  by: Garen Lah  Exercises - Supine Pelvic Tilt  - 1 x daily - 7 x weekly - 3 sets - 10 reps - Supine Single Knee to Chest Stretch  - 2 x daily - 7 x weekly - 1 sets - 5 reps - 10 hold - Supine Lower Trunk Rotation  - 2 x daily - 7 x weekly - 1 sets - 5 reps - 20 hold - Supine Sciatic Nerve Glide  - 1 x daily - 7 x weekly - 3 sets - 10 reps  ASSESSMENT:  CLINICAL IMPRESSION: Patient is a 58 y.o. female who was seen today for physical therapy evaluation and treatment for left sided low back pain with left radiculopathy and thoracic back pain. Pt formerly seen with bil TKA and benefited from PT.  Pt is routinely active and had controlled her 4 years of back pain with exercise but in last 2 months pt is unable to alleviate pain with exercise alone.  Pt is motivated to exercise but has not been able to return to gym in 2 and 1/2 months. Pt will benefit from skilled PT to address impairments and ongoing intermittent pain  OBJECTIVE IMPAIRMENTS: decreased activity tolerance, decreased mobility, difficulty walking, decreased ROM, decreased strength, increased muscle spasms, improper body mechanics, postural dysfunction, obesity, and pain.   ACTIVITY LIMITATIONS:  carrying, lifting, bending, sitting, standing, squatting, reach over head, and locomotion level  PARTICIPATION LIMITATIONS: meal prep, cleaning, laundry, driving, and community activity  PERSONAL FACTORS: OA bil knees, GERD, hyperlipidemia, TKA bil for genu knee varus. Dec 2015 bil, appy, are also affecting patient's functional outcome.   REHAB POTENTIAL: Good  CLINICAL DECISION MAKING: Evolving/moderate complexity  EVALUATION COMPLEXITY: Moderate   GOALS: Goals reviewed with patient? Yes  SHORT TERM GOALS: Target date: 08-16-23  Pt will be I with initial HEP Baseline:no knowledge Goal status: INITIAL  2.  Report pain decrease from 10/10 to 5/10 Baseline: 10/10 at worst Goal status: INITIAL  3.  Demonstrate understanding of neutral posture and be more conscious of position and posture throughout the day.  Baseline:  Goal status: INITIAL  4.  Pt will be able to increase lumbar rotation  and lumbar flexion in order to comfortable perform household chores Baseline: See AROM chart Goal status: INITIAL    LONG TERM GOALS: Target date: 09-10-23  Pt will be independent with advanced HEP Baseline: no knowledge Goal status: INITIAL  2.  Pt will be able to perform planks for 30 seconds without increasing pai Baseline: 10 sec max Goal status: INITIAL  3.  Pt will use household cooking equipment  for making tamales about 15 #  Baseline: unable to lift any weight Goal status: INITIAL  4.  Pt will improve ODI to at least 18% Baseline: eval 30% Goal status: INITIAL  5.  Demonstrate and verbalize techniques to reduce the risk of re-injury including: lifting, posture, body mechanics.  Baseline: no knowledge Goal status: INITIAL  6.  Pt will be able to perform within normal range for age [phone]) Baseline: NT at eval Goal status: INITIAL  PLAN:  PT FREQUENCY: 1-2x/week  PT DURATION: 6 weeks  PLANNED INTERVENTIONS: 97164- PT Re-evaluation, 97110-Therapeutic  exercises, 97530- Therapeutic activity, [id number]- Neuromuscular re-education, 97535- Self Care, 54098- Manual therapy, [phone]- Gait training, [phone]- Electrical stimulation (manual), Patient/Family education, Balance training, Stair training, Taping, Dry Needling, Joint mobilization, Spinal mobilization, Cryotherapy, and Moist heat.  PLAN FOR NEXT SESSION: possible TPDN,  progress exercise and capacity to carry household equipment  and return to gym for exercise  Do   Garen Lah, PT, [facility] Certified Exercise Expert for the Aging Adult  07/26/23 2:52 PM Phone: [phone] Fax: [phone]

## 2023-08-02 ENCOUNTER — Ambulatory Visit: Payer: Self-pay | Admitting: Physical Therapy

## 2023-08-02 ENCOUNTER — Encounter: Payer: Self-pay | Admitting: Physical Therapy

## 2023-08-02 DIAGNOSIS — G8929 Other chronic pain: Secondary | ICD-10-CM

## 2023-08-02 DIAGNOSIS — M6281 Muscle weakness (generalized): Secondary | ICD-10-CM

## 2023-08-02 DIAGNOSIS — M546 Pain in thoracic spine: Secondary | ICD-10-CM

## 2023-08-02 NOTE — Therapy (Signed)
 OUTPATIENT PHYSICAL THERAPY DAILY NOTE   Patient Name: Tina Johnson MRN: [id number] DOB:[birth year], 58 y.o., female Today's Date: 08/02/2023  END OF SESSION:  PT End of Session - 08/02/23 1503     Visit Number 2    Number of Visits 12    Date for PT Re-Evaluation 09/06/23    Authorization Type CAFA    PT Start Time 1500    PT Stop Time 1540    PT Time Calculation (min) 40 min    Activity Tolerance Patient limited by pain;Patient tolerated treatment well    Behavior During Therapy [facility] for tasks assessed/performed             Past Medical History:  Diagnosis Date   Abnormality of gait 04/26/2014   Anxiety    controls with exercise    Arthritis    knees , shoulders   Bradycardia 05/31/2017   DEPRESSION 08/14/2007   Qualifier: Diagnosis of  By: Davonna Belling     Family history of diabetes mellitus (DM) 01/23/2014   Genu varum of both lower extremities 03/07/2014   GERD 08/14/2007   Qualifier: Diagnosis of  By: Davonna Belling     HEADACHE, CHRONIC 08/14/2007   Qualifier: Diagnosis of  By: Davonna Belling     HYPERLIPIDEMIA 08/14/2007   Qualifier: Diagnosis of  By: Abran Richard, Michele     Indigestion    no medicine at present   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   Neoplasm of appendix    OSTEOARTHRITIS 08/14/2007   Qualifier: Diagnosis of  By: Davonna Belling     Other bilateral secondary osteoarthritis of knee 04/03/2015   PONV (postoperative nausea and vomiting) 1999   partial hysterectomy in [place]   Right knee DJD 01/23/2014   S/P TKR (total knee replacement), bilateral 10/07/2016   Past Surgical History:  Procedure Laterality Date   ABDOMINAL HYSTERECTOMY     APPENDECTOMY     JOINT REPLACEMENT Bilateral 2016   [place]   LAPAROSCOPIC APPENDECTOMY N/A 05/02/2017   Procedure: LAPAROSCOPIC CECECTOMY;  Surgeon: Leafy Ro, MD;  Location: [facility] ORS;  Service: General;  Laterality: N/A;  Please open Lap appy tray.  Have  gelport and Right hemicolectomy cart in the room.    LAPAROSCOPIC RIGHT HEMI COLECTOMY Right 09/26/2017   Procedure: LAPAROSCOPIC RIGHT HEMI COLECTOMY;  Surgeon: Leafy Ro, MD;  Location: [facility] ORS;  Service: General;  Laterality: Right;   TOTAL ABDOMINAL HYSTERECTOMY W/ BILATERAL SALPINGOOPHORECTOMY  2001   done at [facility] hosp   TOTAL KNEE ARTHROPLASTY Right 04/03/2015   Procedure: RIGHT TOTAL KNEE ARTHROPLASTY;  Surgeon: Tarry Kos, MD;  Location: [facility] OR;  Service: Orthopedics;  Laterality: Right;   TOTAL KNEE ARTHROPLASTY Left 04/03/2015   Procedure: LEFT TOTAL KNEE ARTHROPLASTY;  Surgeon: Tarry Kos, MD;  Location: [facility] OR;  Service: Orthopedics;  Laterality: Left;   VAGINAL DELIVERY     ?x2   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   Bradycardia 05/31/2017   S/P TKR (total knee replacement), bilateral 10/07/2016   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   Genu varum of both lower extremities 03/07/2014   Family history of diabetes mellitus (DM) 01/23/2014   Right knee DJD 01/23/2014   Hyperlipidemia 01/23/2014  HYPERLIPIDEMIA 08/14/2007   DEPRESSION 08/14/2007   GERD 08/14/2007   Headache 08/14/2007    PCP: Lawrance Presume, MD   REFERRING PROVIDER: Jude Norton, MD   REFERRING DIAG: [phone] (ICD-10-CM) - Chronic low back pain, unspecified back pain laterality, unspecified whether sciatica present   Rationale for Evaluation and Treatment: Rehabilitation  THERAPY DIAG:  Chronic left-sided low back pain with left-sided sciatica  Pain in thoracic spine  Muscle weakness (generalized)  ONSET DATE: onset 4 years ago but more recently exacerbating pain in last 2 months  SUBJECTIVE:                                                                                                                                                                                            SUBJECTIVE STATEMENT:  In person interpreter present throughout  08/02/2023:  Pt reports that she is doing well overall and has been completing her HEP.  She has not been walking because of the cold but reports she is very active at her house.  EVAL: Through interpreter, pt explains she has had pain for about 4 years but she did exercise and it helped her pain but in the last 2 months it has gotten worse and exercise don't help. After 2 hours of sitting pain begins.  Pain is worse when she is sitting  but walking is better for pain. Stand is OK but she is having more pain down her left side. She went the whole day mostly standing and last night when she was lying down and husband massaging her left lower leg the pain went away in her back. 4 years ago her back pain started with no incident.  More in low back and upper back  PERTINENT HISTORY:  OA bil knees, GERD, hyperlipidemia, TKA bil for genu knee varus. Dec 2015 bil, appy,   PAIN:  Are you having pain? Yes: NPRS scale: at rest 7/10 now and at worst 10/10  Pain location: thoracic  and low back Pain description: spasming achy pain Aggravating factors: lifting household items like appliances to make tamales  about 14-15 pounds, sometimes disrupts sleep Relieving factors: movement makes better  PRECAUTIONS: None  RED FLAGS: None   WEIGHT BEARING RESTRICTIONS: No  FALLS:  Has patient fallen in last 6 months? 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  DIAGNOSTIC FINDINGS:   2 view x-rays of the lumbar spine show lordosis of the lumbar spine, there  is some noted retrolisthesis of L4-L5.  Some noted spurring throughout the  lumbar spine and mild sclerotic changes.  Disc height does appear to be  narrower at L4-L5 and L1-L2.  Patient also noted to have some mild  thoracic disc height narrowing  See medical records for x rays  PATIENT SURVEYS:  Modified Oswestry 30%   COGNITION: Overall cognitive status: Within functional limits for tasks assessed     SENSATION: WFL  MUSCLE LENGTH: Hamstrings: slight tightness L > R   POSTURE: rounded shoulders, forward head, and pelvic level higher on left side  PALPATION: Pt with TTP over thoracic lumbar paraspinals  LUMBAR ROM:   AROM eval  Flexion Fingertips to ankles *  Extension 50% decrease *  Right lateral flexion WFL  Left lateral flexion WFL  Right rotation 50%  Left rotation 50%  Key: WFL = within functional limits not formally assessed, * = concordant pain, s = stiffness/stretching sensation, NT = not tested)  LOWER EXTREMITY ROM:     Active  Right eval Left eval  Hip flexion 70standing 70 standing *  Hip extension    Hip abduction    Hip adduction    Hip internal rotation    Hip external rotation    Knee flexion    Knee extension    Ankle dorsiflexion    Ankle plantarflexion    Ankle inversion    Ankle eversion    (Blank rows = not tested, score listed is out of 5 possible points.  N = WNL, D = diminished, C = clear for gross weakness with myotome testing, * = concordant pain with testing)   LOWER EXTREMITY MMT:    MMT Right eval Left eval  Hip flexion 4+ 4+  Hip extension 4 4  Hip abduction 4 4-  Hip adduction    Hip internal rotation    Hip external rotation    Knee flexion 4+ 4+  Knee extension 4+ 4-  Ankle dorsiflexion    Ankle plantarflexion    Ankle inversion    Ankle eversion     (Blank rows = not tested)  LUMBAR SPECIAL TESTS:  Straight leg raise test:  Negative, Slump test: Negative, and FABER test: Negative But pt with limited ER bil FUNCTIONAL TESTS:  5 times sit to stand: 11.26 sec 2 minute walk test: TBD  GAIT: Distance walked: 150 to clinic Assistive device utilized: None Level of assistance: Complete Independence Comments: Pt with  slight antalgic gait disturbance  [facility] Adult PT Treatment  08/02/2023:  Therapeutic Exercise:  nu-step L5 68m while taking subjective and planning session with patient Reviewing HEP LTR SKTC - modified with pillowcase as stretch strap Sciatic nerve glide/active HS stretch Bridge - small range - 2x10 Education regarding appropriate pain levels and progressive overload S/L clam - GTB - 2x10 Alternating SLR from foam roller - 2x10    HOME EXERCISE PROGRAM: Access Code: [email or web address] URL: [email or web address] Date: 08/02/2023 Prepared by: Alphonzo Severance  Exercises - Supine Pelvic Tilt  - 1 x daily - 7 x weekly - 3 sets - 10 reps - Supine Single Knee to Chest Stretch  - 2 x daily - 7 x weekly - 1 sets - 5 reps - 10 hold - Supine Lower Trunk Rotation  - 2 x daily - 7 x weekly - 1 sets -  5 reps - 20 hold - Supine Sciatic Nerve Glide  - 1 x daily - 7 x weekly - 3 sets - 10 reps - Supine Bridge  - 1 x daily - 7 x weekly - 3 sets - 10 reps - 2-3 seconds hold - Hooklying Clamshell with Resistance  - 1 x daily - 7 x weekly - 3 sets - 10 reps  ASSESSMENT:  CLINICAL IMPRESSION:  08/02/2023:  Tina Johnson tolerated session well with no adverse reaction.  Reviewed HEP with slight modification for [facility] and sciatic nerve glide.  Added in new basic hip and core strengthening and updated HEP.  Discussed progressive loading and appropriate pain levels for rehab. Pt with reduction from 6/10 to 4/10 pain following therapy.  EVAL: Patient is a 58 y.o. female who was seen today for physical therapy evaluation and treatment for left sided low back pain with left radiculopathy and thoracic back pain. Pt  formerly seen with bil TKA and benefited from PT.  Pt is routinely active and had controlled her 4 years of back pain with exercise but in last 2 months pt is unable to alleviate pain with exercise alone.  Pt is motivated to exercise but has not been able to return to gym in 2 and 1/2 months. Pt will benefit from skilled PT to address impairments and ongoing intermittent pain  OBJECTIVE IMPAIRMENTS: decreased activity tolerance, decreased mobility, difficulty walking, decreased ROM, decreased strength, increased muscle spasms, improper body mechanics, postural dysfunction, obesity, and pain.   ACTIVITY LIMITATIONS: carrying, lifting, bending, sitting, standing, squatting, reach over head, and locomotion level  PARTICIPATION LIMITATIONS: meal prep, cleaning, laundry, driving, and community activity  PERSONAL FACTORS: OA bil knees, GERD, hyperlipidemia, TKA bil for genu knee varus. Dec 2015 bil, appy, are also affecting patient's functional outcome.   REHAB POTENTIAL: Good  CLINICAL DECISION MAKING: Evolving/moderate complexity  EVALUATION COMPLEXITY: Moderate   GOALS: Goals reviewed with patient? Yes  SHORT TERM GOALS: Target date: 08-16-23  Pt will be I with initial HEP Baseline:no knowledge Goal status: INITIAL  2.  Report pain decrease from 10/10 to 5/10 Baseline: 10/10 at worst Goal status: INITIAL  3.  Demonstrate understanding of neutral posture and be more conscious of position and posture throughout the day.  Baseline:  Goal status: INITIAL  4.  Pt will be able to increase lumbar rotation  and lumbar flexion in order to comfortable perform household chores Baseline: See AROM chart Goal status: INITIAL    LONG TERM GOALS: Target date: 09-10-23  Pt will be independent with advanced HEP Baseline: no knowledge Goal status: INITIAL  2.  Pt will be able to perform planks for 30 seconds without increasing pai Baseline: 10 sec max Goal status: INITIAL  3.  Pt will use  household cooking equipment  for making tamales about 15 #  Baseline: unable to lift any weight Goal status: INITIAL  4.  Pt will improve ODI to at least 18% Baseline: eval 30% Goal status: INITIAL  5.  Demonstrate and verbalize techniques to reduce the risk of re-injury including: lifting, posture, body mechanics.  Baseline: no knowledge Goal status: INITIAL  6.  Pt will be able to perform within normal range for age [phone]) Baseline: NT at eval Goal status: INITIAL  PLAN:  PT FREQUENCY: 1-2x/week  PT DURATION: 6 weeks  PLANNED INTERVENTIONS: 97164- PT Re-evaluation, 97110-Therapeutic exercises, 97530- Therapeutic activity, [id number]- Neuromuscular re-education, 97535- Self Care, [id number]- Manual therapy, [id number]- Gait training, [phone]- Electrical stimulation (manual), Patient/Family  education, Balance training, Stair training, Taping, Dry Needling, Joint mobilization, Spinal mobilization, Cryotherapy, and Moist heat.  PLAN FOR NEXT SESSION: possible TPDN,  progress exercise and capacity to carry household equipment and return to gym for exercise  Do   Marquis Sitter PT 08/02/23 3:43 PM Phone: [phone] Fax: [phone]

## 2023-08-03 NOTE — Therapy (Signed)
 OUTPATIENT PHYSICAL THERAPY DAILY NOTE   Patient Name: Tina Johnson MRN: [id number] DOB:[birth year], 58 y.o., female Today's Date: 08/04/2023  END OF SESSION:  PT End of Session - 08/04/23 0953     Visit Number 3    Number of Visits 12    Date for PT Re-Evaluation 09/06/23    Authorization Type CAFA    PT Start Time 0935    PT Stop Time 1015    PT Time Calculation (min) 40 min    Activity Tolerance Patient limited by pain;Patient tolerated treatment well    Behavior During Therapy [facility] for tasks assessed/performed              Past Medical History:  Diagnosis Date   Abnormality of gait 04/26/2014   Anxiety    controls with exercise    Arthritis    knees , shoulders   Bradycardia 05/31/2017   DEPRESSION 08/14/2007   Qualifier: Diagnosis of  By: Davonna Belling     Family history of diabetes mellitus (DM) 01/23/2014   Genu varum of both lower extremities 03/07/2014   GERD 08/14/2007   Qualifier: Diagnosis of  By: Davonna Belling     HEADACHE, CHRONIC 08/14/2007   Qualifier: Diagnosis of  By: Davonna Belling     HYPERLIPIDEMIA 08/14/2007   Qualifier: Diagnosis of  By: Abran Richard, Michele     Indigestion    no medicine at present   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   Neoplasm of appendix    OSTEOARTHRITIS 08/14/2007   Qualifier: Diagnosis of  By: Davonna Belling     Other bilateral secondary osteoarthritis of knee 04/03/2015   PONV (postoperative nausea and vomiting) 1999   partial hysterectomy in [place]   Right knee DJD 01/23/2014   S/P TKR (total knee replacement), bilateral 10/07/2016   Past Surgical History:  Procedure Laterality Date   ABDOMINAL HYSTERECTOMY     APPENDECTOMY     JOINT REPLACEMENT Bilateral 2016   [place]   LAPAROSCOPIC APPENDECTOMY N/A 05/02/2017   Procedure: LAPAROSCOPIC CECECTOMY;  Surgeon: Leafy Ro, MD;  Location: [facility] ORS;  Service: General;  Laterality: N/A;  Please open Lap appy tray.  Have  gelport and Right hemicolectomy cart in the room.    LAPAROSCOPIC RIGHT HEMI COLECTOMY Right 09/26/2017   Procedure: LAPAROSCOPIC RIGHT HEMI COLECTOMY;  Surgeon: Leafy Ro, MD;  Location: [facility] ORS;  Service: General;  Laterality: Right;   TOTAL ABDOMINAL HYSTERECTOMY W/ BILATERAL SALPINGOOPHORECTOMY  2001   done at [facility] hosp   TOTAL KNEE ARTHROPLASTY Right 04/03/2015   Procedure: RIGHT TOTAL KNEE ARTHROPLASTY;  Surgeon: Tarry Kos, MD;  Location: [facility] OR;  Service: Orthopedics;  Laterality: Right;   TOTAL KNEE ARTHROPLASTY Left 04/03/2015   Procedure: LEFT TOTAL KNEE ARTHROPLASTY;  Surgeon: Tarry Kos, MD;  Location: [facility] OR;  Service: Orthopedics;  Laterality: Left;   VAGINAL DELIVERY     ?x2   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   Bradycardia 05/31/2017   S/P TKR (total knee replacement), bilateral 10/07/2016   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   Genu varum of both lower extremities 03/07/2014   Family history of diabetes mellitus (DM) 01/23/2014   Right knee DJD 01/23/2014   Hyperlipidemia 01/23/2014  HYPERLIPIDEMIA 08/14/2007   DEPRESSION 08/14/2007   GERD 08/14/2007   Headache 08/14/2007    PCP: Lawrance Presume, MD   REFERRING PROVIDER: Jude Norton, MD   REFERRING DIAG: [phone] (ICD-10-CM) - Chronic low back pain, unspecified back pain laterality, unspecified whether sciatica present   Rationale for Evaluation and Treatment: Rehabilitation  THERAPY DIAG:  Chronic left-sided low back pain with left-sided sciatica  Pain in thoracic spine  Muscle weakness (generalized)  ONSET DATE: onset 4 years ago but more recently exacerbating pain in last 2 months  SUBJECTIVE:                                                                                                                                                                                            SUBJECTIVE STATEMENT:  In person interpreter present throughout reporting 2/10 pain now.   08/04/2023:  Pt reports that she is doing well overall and has been completing her HEP.  She has not been walking because of the cold but reports she is very active at her house.  EVAL: Through interpreter, pt explains she has had pain for about 4 years but she did exercise and it helped her pain but in the last 2 months it has gotten worse and exercise don't help. After 2 hours of sitting pain begins.  Pain is worse when she is sitting  but walking is better for pain. Stand is OK but she is having more pain down her left side. She went the whole day mostly standing and last night when she was lying down and husband massaging her left lower leg the pain went away in her back. 4 years ago her back pain started with no incident.  More in low back and upper back  PERTINENT HISTORY:  OA bil knees, GERD, hyperlipidemia, TKA bil for genu knee varus. Dec 2015 bil, appy,   PAIN:  Are you having pain? Yes: NPRS scale: at rest 7/10 now and at worst 10/10  Pain location: thoracic  and low back Pain description: spasming achy pain Aggravating factors: lifting household items like appliances to make tamales  about 14-15 pounds, sometimes disrupts sleep Relieving factors: movement makes better  PRECAUTIONS: None  RED FLAGS: None   WEIGHT BEARING RESTRICTIONS: No  FALLS:  Has patient fallen in last 6 months? 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  DIAGNOSTIC FINDINGS:  2 view x-rays of the lumbar spine show lordosis of the lumbar spine, there  is some noted retrolisthesis of L4-L5.  Some noted spurring throughout the  lumbar spine and mild sclerotic changes.  Disc height does appear to be  narrower at L4-L5 and L1-L2.  Patient also noted to have some mild  thoracic disc height narrowing  See medical records for x rays  PATIENT SURVEYS:  Modified Oswestry 30%   COGNITION: Overall cognitive status: Within functional limits for tasks assessed     SENSATION: WFL  MUSCLE LENGTH: Hamstrings: slight tightness L > R   POSTURE: rounded shoulders, forward head, and pelvic level higher on left side  PALPATION: Pt with TTP over thoracic lumbar paraspinals  LUMBAR ROM:   AROM eval  Flexion Fingertips to ankles *  Extension 50% decrease *  Right lateral flexion WFL  Left lateral flexion WFL  Right rotation 50%  Left rotation 50%  Key: WFL = within functional limits not formally assessed, * = concordant pain, s = stiffness/stretching sensation, NT = not tested)  LOWER EXTREMITY ROM:     Active  Right eval Left eval  Hip flexion 70standing 70 standing *  Hip extension    Hip abduction    Hip adduction    Hip internal rotation    Hip external rotation    Knee flexion    Knee extension    Ankle dorsiflexion    Ankle plantarflexion    Ankle inversion    Ankle eversion    (Blank rows = not tested, score listed is out of 5 possible points.  N = WNL, D = diminished, C = clear for gross weakness with myotome testing, * = concordant pain with testing)   LOWER EXTREMITY MMT:    MMT Right eval Left eval  Hip flexion 4+ 4+  Hip extension 4 4  Hip abduction 4 4-  Hip adduction    Hip internal rotation    Hip external rotation    Knee flexion 4+ 4+  Knee extension 4+ 4-  Ankle dorsiflexion    Ankle plantarflexion    Ankle inversion    Ankle eversion     (Blank rows = not tested)  LUMBAR SPECIAL TESTS:   Straight leg raise test: Negative, Slump test: Negative, and FABER test: Negative But pt with limited ER bil FUNCTIONAL TESTS:  5 times sit to stand: 11.26 sec 2 minute walk test: TBD  GAIT: Distance walked: 150 to clinic Assistive device utilized: None Level of assistance: Complete Independence Comments: Pt with  slight antalgic gait disturbance  [facility] Adult PT Treatment:                                                DATE: 08-04-23 Therapeutic Exercise: Nu step UE/LE L5 31m while taking subjective  PPT 20x  SKTC - no need for modification today 5 x 10 second R and L LTR 3 x 20 sec each Sciatic nerve glide/active HS stretch Bridge with ball 2 x 10 3/4 range Hook lying GTB  Clamshell 2 x 10 Supine march with hands behind back to encourage PPT 2 x 10  Self Care: DOMS  explanation  Correct diaphragmatic breathing  coordinating with exercise [facility] Adult PT Treatment  08/02/2023:  Therapeutic Exercise:  nu-step L5 7m while taking subjective and planning session with patient Reviewing HEP LTR SKTC - modified with pillowcase as stretch strap Sciatic nerve glide/active HS stretch Bridge - small range - 2x10 Education regarding appropriate pain levels and progressive overload S/L clam - GTB - 2x10 Alternating SLR from foam roller - 2x10    HOME EXERCISE PROGRAM: Access Code: [email or web address] URL: [email or web address] Date: 08/02/2023 Prepared by: Lesleigh Rash  Exercises - Supine Pelvic Tilt  - 1 x daily - 7 x weekly - 3 sets - 10 reps - Supine Single Knee to Chest Stretch  - 2 x daily - 7 x weekly - 1 sets - 5 reps - 10 hold - Supine Lower Trunk Rotation  - 2 x daily - 7 x weekly - 1 sets - 5 reps - 20 hold - Supine Sciatic Nerve Glide  - 1 x daily - 7 x weekly - 3 sets - 10 reps - Supine Bridge  - 1 x daily - 7 x weekly - 3 sets - 10 reps - 2-3 seconds hold - Hooklying Clamshell with Resistance  - 1 x daily - 7 x weekly - 3 sets - 10 reps Added 08-04-23 - Supine  March with Hands Behind Back  - 1 x daily - 7 x weekly - 3 sets - 10 reps - Sidelying Open Book Thoracic Rotation with Knee on Foam Roll  - 1 x daily - 7 x weekly - 2 sets - 10 reps ASSESSMENT:  CLINICAL IMPRESSION:  08-04-23  Taccara tolerated session well with no adverse reaction.  Added to HEP  with adding marching and thoracic stretching.spanish interpreter present throughout session .  Discussed progressive loading and appropriate pain levels for rehab. Pt  pain  2/10 throughout session with increased flexibility.  Educated on DOMS and correct diaphragmatic breathing.  EVAL: Patient is a 58 y.o. female who was seen today for physical therapy evaluation and treatment for left sided low back pain with left radiculopathy and thoracic back pain. Pt formerly seen with bil TKA and benefited from PT.  Pt is routinely active and had controlled her 4 years of back pain with exercise but in last 2 months pt is unable to alleviate pain with exercise alone.  Pt is motivated to exercise but has not been able to return to gym in 2 and 1/2 months. Pt will benefit from skilled PT to address impairments and ongoing intermittent pain  OBJECTIVE IMPAIRMENTS: decreased activity tolerance, decreased mobility, difficulty walking, decreased ROM, decreased strength, increased muscle spasms, improper body mechanics, postural dysfunction, obesity, and pain.   ACTIVITY LIMITATIONS: carrying, lifting, bending, sitting, standing, squatting, reach over head, and locomotion level  PARTICIPATION LIMITATIONS: meal prep, cleaning, laundry, driving, and community activity  PERSONAL FACTORS: OA bil knees, GERD, hyperlipidemia, TKA bil for genu knee varus. Dec 2015 bil, appy, are also affecting patient's functional outcome.   REHAB POTENTIAL: Good  CLINICAL DECISION MAKING: Evolving/moderate complexity  EVALUATION COMPLEXITY: Moderate   GOALS: Goals reviewed with patient? Yes  SHORT TERM GOALS: Target date:  08-16-23  Pt will be I with initial HEP Baseline:no knowledge Goal status:ONGOING  2.  Report pain decrease from 10/10 to 5/10 Baseline: 10/10 at worst 08-04-23  2/10 Goal status: ONGOING  3.  Demonstrate understanding of neutral posture and be more conscious of position and posture throughout the day.  Baseline:  Goal status: ONGOING  4.  Pt will be able to increase lumbar rotation  and lumbar flexion  in order to comfortable perform household chores Baseline: See AROM chart Goal status: ONGOING    LONG TERM GOALS: Target date: 09-10-23  Pt will be independent with advanced HEP Baseline: no knowledge Goal status: INITIAL  2.  Pt will be able to perform planks for 30 seconds without increasing pai Baseline: 10 sec max Goal status: INITIAL  3.  Pt will use household cooking equipment  for making tamales about 15 #  Baseline: unable to lift any weight Goal status: INITIAL  4.  Pt will improve ODI to at least 18% Baseline: eval 30% Goal status: INITIAL  5.  Demonstrate and verbalize techniques to reduce the risk of re-injury including: lifting, posture, body mechanics.  Baseline: no knowledge Goal status: INITIAL  6.  Pt will be able to perform within normal range for age [phone] ft) Baseline: NT at eval Goal status: INITIAL  PLAN:  PT FREQUENCY: 1-2x/week  PT DURATION: 6 weeks  PLANNED INTERVENTIONS: 97164- PT Re-evaluation, 97110-Therapeutic exercises, 97530- Therapeutic activity, [id number]- Neuromuscular re-education, 97535- Self Care, [id number]- Manual therapy, [phone]- Gait training, [phone]- Electrical stimulation (manual), Patient/Family education, Balance training, Stair training, Taping, Dry Needling, Joint mobilization, Spinal mobilization, Cryotherapy, and Moist heat.  PLAN FOR NEXT SESSION: possible TPDN,  progress exercise and capacity to carry household equipment and return to gym for exercise  Do   Sharlet Dawson, PT, [facility] Certified Exercise  Expert for the Aging Adult  08/04/23 10:19 AM Phone: [phone] Fax: [phone]

## 2023-08-04 ENCOUNTER — Ambulatory Visit: Payer: Self-pay | Admitting: Physical Therapy

## 2023-08-04 DIAGNOSIS — G8929 Other chronic pain: Secondary | ICD-10-CM

## 2023-08-04 DIAGNOSIS — M6281 Muscle weakness (generalized): Secondary | ICD-10-CM

## 2023-08-04 DIAGNOSIS — M546 Pain in thoracic spine: Secondary | ICD-10-CM

## 2023-08-09 ENCOUNTER — Encounter: Payer: Self-pay | Admitting: Physical Therapy

## 2023-08-09 ENCOUNTER — Ambulatory Visit: Payer: Self-pay | Admitting: Physical Therapy

## 2023-08-09 DIAGNOSIS — M546 Pain in thoracic spine: Secondary | ICD-10-CM

## 2023-08-09 DIAGNOSIS — G8929 Other chronic pain: Secondary | ICD-10-CM

## 2023-08-09 DIAGNOSIS — M6281 Muscle weakness (generalized): Secondary | ICD-10-CM

## 2023-08-09 NOTE — Therapy (Signed)
 OUTPATIENT PHYSICAL THERAPY DAILY NOTE   Patient Name: Tina Johnson MRN: [id number] DOB:01-19-1966, 58 y.o., female Today's Date: 08/09/2023  END OF SESSION:  PT End of Session - 08/09/23 0851     Visit Number 4    Number of Visits 12    Date for PT Re-Evaluation 09/06/23    Authorization Type CAFA    PT Start Time 0847    PT Stop Time 0930    PT Time Calculation (min) 43 min              Past Medical History:  Diagnosis Date   Abnormality of gait 04/26/2014   Anxiety    controls with exercise    Arthritis    knees , shoulders   Bradycardia 05/31/2017   DEPRESSION 08/14/2007   Qualifier: Diagnosis of  By: Zara Heymann     Family history of diabetes mellitus (DM) 01/23/2014   Genu varum of both lower extremities 03/07/2014   GERD 08/14/2007   Qualifier: Diagnosis of  By: Zara Heymann     HEADACHE, CHRONIC 08/14/2007   Qualifier: Diagnosis of  By: Zara Heymann     HYPERLIPIDEMIA 08/14/2007   Qualifier: Diagnosis of  By: Zara Heymann     Indigestion    no medicine at present   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   Neoplasm of appendix    OSTEOARTHRITIS 08/14/2007   Qualifier: Diagnosis of  By: Zara Heymann     Other bilateral secondary osteoarthritis of knee 04/03/2015   PONV (postoperative nausea and vomiting) 1999   partial hysterectomy in [place]   Right knee DJD 01/23/2014   S/P TKR (total knee replacement), bilateral 10/07/2016   Past Surgical History:  Procedure Laterality Date   ABDOMINAL HYSTERECTOMY     APPENDECTOMY     JOINT REPLACEMENT Bilateral 2016   [place]   LAPAROSCOPIC APPENDECTOMY N/A 05/02/2017   Procedure: LAPAROSCOPIC CECECTOMY;  Surgeon: Alben Alma, MD;  Location: [facility] ORS;  Service: General;  Laterality: N/A;  Please open Lap appy tray.  Have gelport and Right hemicolectomy cart in the room.    LAPAROSCOPIC RIGHT HEMI COLECTOMY Right 09/26/2017   Procedure: LAPAROSCOPIC RIGHT HEMI  COLECTOMY;  Surgeon: Alben Alma, MD;  Location: [facility] OR[facility];  Service: General;  Laterality: Right;   TOTAL ABDOMINAL HYSTERECTOMY W/ BILATERAL SALPINGOOPHORECTOMY  2001   done at [facility] hosp   TOTAL KNEE ARTHROPLASTY Right 04/03/2015   Procedure: RIGHT TOTAL KNEE ARTHROPLASTY;  Surgeon: Wes Hamman, MD;  Location: [facility] OR;  Service: Orthopedics;  Laterality: Right;   TOTAL KNEE ARTHROPLASTY Left 04/03/2015   Procedure: LEFT TOTAL KNEE ARTHROPLASTY;  Surgeon: Wes Hamman, MD;  Location: [facility] OR;  Service: Orthopedics;  Laterality: Left;   VAGINAL DELIVERY     ?x2   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   Bradycardia 05/31/2017   S/P TKR (total knee replacement), bilateral 10/07/2016   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   Genu varum of both lower extremities 03/07/2014   Family history of diabetes mellitus (DM) 01/23/2014   Right knee DJD 01/23/2014   Hyperlipidemia 01/23/2014   HYPERLIPIDEMIA 08/14/2007   DEPRESSION 08/14/2007   GERD 08/14/2007   Headache 08/14/2007    PCP: Concetta Dee  B, MD   REFERRING PROVIDER: Jude Norton, MD   REFERRING DIAG: M54.50,G89.29 (ICD-10-CM) - Chronic low back pain, unspecified back pain laterality, unspecified whether sciatica present   Rationale for Evaluation and Treatment: Rehabilitation  THERAPY DIAG:  Chronic left-sided low back pain with left-sided sciatica  Pain in thoracic spine  Muscle weakness (generalized)  ONSET DATE: onset 4 years ago but more recently exacerbating pain in last 2 months  SUBJECTIVE:                                                                                                                                                                                           SUBJECTIVE STATEMENT:  In person  interpreter present throughout reporting 0/10 pain now. Went for a 30 minute walk.    EVAL: Through interpreter, pt explains she has had pain for about 4 years but she did exercise and it helped her pain but in the last 2 months it has gotten worse and exercise don't help. After 2 hours of sitting pain begins.  Pain is worse when she is sitting  but walking is better for pain. Stand is OK but she is having more pain down her left side. She went the whole day mostly standing and last night when she was lying down and husband massaging her left lower leg the pain went away in her back. 4 years ago her back pain started with no incident.  More in low back and upper back  PERTINENT HISTORY:  OA bil knees, GERD, hyperlipidemia, TKA bil for genu knee varus. Dec 2015 bil, appy,   PAIN:  Are you having pain? Yes: NPRS scale: at rest 7/10 now and at worst 10/10  Pain location: thoracic  and low back Pain description: spasming achy pain Aggravating factors: lifting household items like appliances to make tamales  about 14-15 pounds, sometimes disrupts sleep Relieving factors: movement makes better  PRECAUTIONS: None  RED FLAGS: None   WEIGHT BEARING RESTRICTIONS: No  FALLS:  Has patient fallen in last 6 months? 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  DIAGNOSTIC FINDINGS:  2 view x-rays of the lumbar spine show lordosis of the lumbar spine, there  is some noted retrolisthesis of L4-L5.  Some noted spurring throughout the  lumbar spine and  mild sclerotic changes.  Disc height does appear to be  narrower at L4-L5 and L1-L2.   Patient also noted to have some mild  thoracic disc height narrowing  See medical records for x rays  PATIENT SURVEYS:  Modified Oswestry 30%   COGNITION: Overall cognitive status: Within functional limits for tasks assessed     SENSATION: WFL  MUSCLE LENGTH: Hamstrings: slight tightness L > R   POSTURE: rounded shoulders, forward head, and pelvic level higher on left side  PALPATION: Pt with TTP over thoracic lumbar paraspinals  LUMBAR ROM:   [facility] eval  Flexion Fingertips to ankles *  Extension 50% decrease *  Right lateral flexion WFL  Left lateral flexion WFL  Right rotation 50%  Left rotation 50%  Key: WFL = within functional limits not formally assessed, * = concordant pain, s = stiffness/stretching sensation, NT = not tested)  LOWER EXTREMITY ROM:     Active  Right eval Left eval  Hip flexion 70standing 70 standing *  Hip extension    Hip abduction    Hip adduction    Hip internal rotation    Hip external rotation    Knee flexion    Knee extension    Ankle dorsiflexion    Ankle plantarflexion    Ankle inversion    Ankle eversion    (Blank rows = not tested, score listed is out of 5 possible points.  N = WNL, D = diminished, C = clear for gross weakness with myotome testing, * = concordant pain with testing)   LOWER EXTREMITY MMT:    MMT Right eval Left eval  Hip flexion 4+ 4+  Hip extension 4 4  Hip abduction 4 4-  Hip adduction    Hip internal rotation    Hip external rotation    Knee flexion 4+ 4+  Knee extension 4+ 4-  Ankle dorsiflexion    Ankle plantarflexion    Ankle inversion    Ankle eversion     (Blank rows = not tested)  LUMBAR SPECIAL TESTS:  Straight leg raise test: Negative, Slump test: Negative, and FABER test: Negative But pt with limited ER bil FUNCTIONAL TESTS:  5 times sit to stand: 11.26 sec 2 minute walk test: TBD 08/09/23: 6 MWT :1527 feet no increased pain  GAIT: Distance walked: 150 to clinic Assistive device  utilized: None Level of assistance: Complete Independence Comments: Pt with  slight antalgic gait disturbance  [facility] Adult PT Treatment:                                                DATE: 08/09/23 Therapeutic Exercise: Bridge 10 x 2  Supine Marching with hands behind back  Supine blue band clam while maintaining PPT  Supine alternating bent knee fall out with BTB SLR with plates ring x 10 each   Therapeutic Activity: 6 MWT: 1527 feet    [facility] Adult PT Treatment:                                                DATE: 08-04-23 Therapeutic Exercise: Nu step UE/LE L5 55m while taking subjective  PPT 20x  SKTC - no need for modification today 5 x 10 second R  and L LTR 3 x 20 sec each Sciatic nerve glide/active HS stretch Bridge with ball 2 x 10 3/4 range Hook lying GTB  Clamshell 2 x 10 Supine march with hands behind back to encourage PPT 2 x 10  Self Care: DOMS  explanation  Correct diaphragmatic breathing  coordinating with exercise [facility] Adult PT Treatment  08/02/2023:  Therapeutic Exercise:  nu-step L5 87m while taking subjective and planning session with patient Reviewing HEP LTR SKTC - modified with pillowcase as stretch strap Sciatic nerve glide/active HS stretch Bridge - small range - 2x10 Education regarding appropriate pain levels and progressive overload S/L clam - GTB - 2x10 Alternating SLR from foam roller - 2x10    HOME EXERCISE PROGRAM: Access Code: [email or web address] URL: [email or web address] Date: 08/02/2023 Prepared by: Lesleigh Rash  Exercises - Supine Pelvic Tilt  - 1 x daily - 7 x weekly - 3 sets - 10 reps - Supine Single Knee to Chest Stretch  - 2 x daily - 7 x weekly - 1 sets - 5 reps - 10 hold - Supine Lower Trunk Rotation  - 2 x daily - 7 x weekly - 1 sets - 5 reps - 20 hold - Supine Sciatic Nerve Glide  - 1 x daily - 7 x weekly - 3 sets - 10 reps - Supine Bridge  - 1 x daily - 7 x weekly - 3 sets - 10 reps - 2-3 seconds hold - Hooklying  Clamshell with Resistance  - 1 x daily - 7 x weekly - 3 sets - 10 reps Added 08-04-23 - Supine March with Hands Behind Back  - 1 x daily - 7 x weekly - 3 sets - 10 reps - Sidelying Open Book Thoracic Rotation with Knee on Foam Roll  - 1 x daily - 7 x weekly - 2 sets - 10 reps ASSESSMENT:  CLINICAL IMPRESSION:  08-04-23  Tabbitha tolerated session well with no adverse reaction.Captured 6 MWT at 1527 feet which is below normal range for age.  Focused on lumbar stabilization with cues on how to maintain PPT and continued breathing.  No increased pain during session.   EVAL: Patient is a 58 y.o. female who was seen today for physical therapy evaluation and treatment for left sided low back pain with left radiculopathy and thoracic back pain. Pt formerly seen with bil TKA and benefited from PT.  Pt is routinely active and had controlled her 4 years of back pain with exercise but in last 2 months pt is unable to alleviate pain with exercise alone.  Pt is motivated to exercise but has not been able to return to gym in 2 and 1/2 months. Pt will benefit from skilled PT to address impairments and ongoing intermittent pain  OBJECTIVE IMPAIRMENTS: decreased activity tolerance, decreased mobility, difficulty walking, decreased ROM, decreased strength, increased muscle spasms, improper body mechanics, postural dysfunction, obesity, and pain.   ACTIVITY LIMITATIONS: carrying, lifting, bending, sitting, standing, squatting, reach over head, and locomotion level  PARTICIPATION LIMITATIONS: meal prep, cleaning, laundry, driving, and community activity  PERSONAL FACTORS: OA bil knees, GERD, hyperlipidemia, TKA bil for genu knee varus. Dec 2015 bil, appy, are also affecting patient's functional outcome.   REHAB POTENTIAL: Good  CLINICAL DECISION MAKING: Evolving/moderate complexity  EVALUATION COMPLEXITY: Moderate   GOALS: Goals reviewed with patient? Yes  SHORT TERM GOALS: Target date: 08-16-23  Pt will  be I with initial HEP Baseline:no knowledge Goal status:ONGOING  2.  Report pain decrease from 10/10 to 5/10  Baseline: 10/10 at worst 08-04-23  2/10 Goal status: ONGOING  3.  Demonstrate understanding of neutral posture and be more conscious of position and posture throughout the day.  Baseline:  Goal status: ONGOING  4.  Pt will be able to increase lumbar rotation  and lumbar flexion in order to comfortable perform household chores Baseline: See AR[facility] chart Goal status: ONGOING    LONG TERM GOALS: Target date: 09-10-23  Pt will be independent with advanced HEP Baseline: no knowledge Goal status: INITIAL  2.  Pt will be able to perform planks for 30 seconds without increasing pai Baseline: 10 sec max Goal status: INITIAL  3.  Pt will use household cooking equipment  for making tamales about 15 #  Baseline: unable to lift any weight Goal status: INITIAL  4.  Pt will improve ODI to at least 18% Baseline: eval 30% Goal status: INITIAL  5.  Demonstrate and verbalize techniques to reduce the risk of re-injury including: lifting, posture, body mechanics.  Baseline: no knowledge Goal status: INITIAL  6.  Pt will be able to perform within normal range for age [phone] ft) Baseline: NT at eval 08/09/23: 1527 feet  Goal status: ONGOING  PLAN:  PT FREQUENCY: 1-2x/week  PT DURATION: 6 weeks  PLANNED INTERVENTIONS: 97164- PT Re-evaluation, 97110-Therapeutic exercises, 97530- Therapeutic activity, 97112- Neuromuscular re-education, 97535- Self Care, 54098- Manual therapy, [phone]- Gait training, [phone]- Electrical stimulation (manual), Patient/Family education, Balance training, Stair training, Taping, Dry Needling, Joint mobilization, Spinal mobilization, Cryotherapy, and Moist heat.  PLAN FOR NEXT SESSION: possible TPDN,  progress exercise and capacity to carry household equipment and return to gym for exercise     Gasper Karst, PTA 08/09/23 9:29 AM Phone:  [phone] Fax: [phone]

## 2023-08-10 NOTE — Therapy (Signed)
OUTPATIENT PHYSICAL THERAPY DAILY NOTE   Patient Name: Tina Johnson MRN: [id number] DOB:12-Jul-1965, 58 y.o., female Today's Date: 08/11/2023  END OF SESSION:  PT End of Session - 08/11/23 0850     Visit Number 5    Number of Visits 12    Date for PT Re-Evaluation 09/06/23    Authorization Type CAFA    PT Start Time 0850    PT Stop Time 0930    PT Time Calculation (min) 40 min    Activity Tolerance Patient limited by pain;Patient tolerated treatment well    Behavior During Therapy [facility] for tasks assessed/performed               Past Medical History:  Diagnosis Date   Abnormality of gait 04/26/2014   Anxiety    controls with exercise    Arthritis    knees , shoulders   Bradycardia 05/31/2017   DEPRESSION 08/14/2007   Qualifier: Diagnosis of  By: Zara Heymann     Family history of diabetes mellitus (DM) 01/23/2014   Genu varum of both lower extremities 03/07/2014   GERD 08/14/2007   Qualifier: Diagnosis of  By: Zara Heymann     HEADACHE, CHRONIC 08/14/2007   Qualifier: Diagnosis of  By: Zara Heymann     HYPERLIPIDEMIA 08/14/2007   Qualifier: Diagnosis of  By: Milinda Allen, Michele     Indigestion    no medicine at present   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   Neoplasm of appendix    OSTEOARTHRITIS 08/14/2007   Qualifier: Diagnosis of  By: Zara Heymann     Other bilateral secondary osteoarthritis of knee 04/03/2015   PONV (postoperative nausea and vomiting) 1999   partial hysterectomy in [place]   Right knee DJD 01/23/2014   S/P TKR (total knee replacement), bilateral 10/07/2016   Past Surgical History:  Procedure Laterality Date   ABDOMINAL HYSTERECTOMY     APPENDECTOMY     JOINT REPLACEMENT Bilateral 2016   [place]   LAPAROSCOPIC APPENDECTOMY N/A 05/02/2017   Procedure: LAPAROSCOPIC CECECTOMY;  Surgeon: Alben Alma, MD;  Location: [facility] ORS;  Service: General;  Laterality: N/A;  Please open Lap appy tray.  Have  gelport and Right hemicolectomy cart in the room.    LAPAROSCOPIC RIGHT HEMI COLECTOMY Right 09/26/2017   Procedure: LAPAROSCOPIC RIGHT HEMI COLECTOMY;  Surgeon: Alben Alma, MD;  Location: [facility] ORS;  Service: General;  Laterality: Right;   TOTAL ABDOMINAL HYSTERECTOMY W/ BILATERAL SALPINGOOPHORECTOMY  2001   done at [facility] hosp   TOTAL KNEE ARTHROPLASTY Right 04/03/2015   Procedure: RIGHT TOTAL KNEE ARTHROPLASTY;  Surgeon: Wes Hamman, MD;  Location: [facility] OR;  Service: Orthopedics;  Laterality: Right;   TOTAL KNEE ARTHROPLASTY Left 04/03/2015   Procedure: LEFT TOTAL KNEE ARTHROPLASTY;  Surgeon: Wes Hamman, MD;  Location: [facility] OR;  Service: Orthopedics;  Laterality: Left;   VAGINAL DELIVERY     ?x2   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   Bradycardia 05/31/2017   S/P TKR (total knee replacement), bilateral 10/07/2016   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   Genu varum of both lower extremities 03/07/2014   Family history of diabetes mellitus (DM) 01/23/2014   Right knee DJD 01/23/2014   Hyperlipidemia  01/23/2014   HYPERLIPIDEMIA 08/14/2007   DEPRESSION 08/14/2007   GERD 08/14/2007   Headache 08/14/2007    PCP: Lawrance Presume, MD   REFERRING PROVIDER: Jude Norton, MD   REFERRING DIAG: [phone] (ICD-10-CM) - Chronic low back pain, unspecified back pain laterality, unspecified whether sciatica present   Rationale for Evaluation and Treatment: Rehabilitation  THERAPY DIAG:  Chronic left-sided low back pain with left-sided sciatica  Pain in thoracic spine  Muscle weakness (generalized)  ONSET DATE: onset 4 years ago but more recently exacerbating pain in last 2 months  SUBJECTIVE:                                                                                                                                                                                            SUBJECTIVE STATEMENT:  In person interpreter present throughout reporting 0/10 pain now. "I was very active and I had a little pain and took a warm shower and I felt better" I am washing dishes and doing some housework but not mopping the floor   EVAL: Through interpreter, pt explains she has had pain for about 4 years but she did exercise and it helped her pain but in the last 2 months it has gotten worse and exercise don't help. After 2 hours of sitting pain begins.  Pain is worse when she is sitting  but walking is better for pain. Stand is OK but she is having more pain down her left side. She went the whole day mostly standing and last night when she was lying down and husband massaging her left lower leg the pain went away in her back. 4 years ago her back pain started with no incident.  More in low back and upper back  PERTINENT HISTORY:  OA bil knees, GERD, hyperlipidemia, TKA bil for genu knee varus. Dec 2015 bil, appy,   PAIN:  Are you having pain? Yes: NPRS scale: at rest 7/10 now and at worst 10/10  Pain location: thoracic  and low back Pain description: spasming achy pain Aggravating factors: lifting household items like appliances to make tamales  about 14-15 pounds, sometimes disrupts sleep Relieving factors: movement makes better  PRECAUTIONS: None  RED FLAGS: None   WEIGHT BEARING RESTRICTIONS: No  FALLS:  Has patient fallen in last 6 months? 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  DIAGNOSTIC FINDINGS:   2 view x-rays of the lumbar spine show lordosis of the lumbar spine, there  is some noted retrolisthesis of L4-L5.  Some noted spurring throughout the  lumbar spine and mild sclerotic changes.  Disc height does appear to be  narrower at L4-L5 and L1-L2.  Patient also noted to have some mild  thoracic disc height narrowing  See medical records for x rays  PATIENT SURVEYS:  Modified Oswestry 30%   COGNITION: Overall cognitive status: Within functional limits for tasks assessed     SENSATION: WFL  MUSCLE LENGTH: Hamstrings: slight tightness L > R   POSTURE: rounded shoulders, forward head, and pelvic level higher on left side  PALPATION: Pt with TTP over thoracic lumbar paraspinals  LUMBAR ROM:   [facility] eval  Flexion Fingertips to ankles *  Extension 50% decrease *  Right lateral flexion WFL  Left lateral flexion WFL  Right rotation 50%  Left rotation 50%  Key: WFL = within functional limits not formally assessed, * = concordant pain, s = stiffness/stretching sensation, NT = not tested)  LOWER EXTREMITY ROM:     Active  Right eval Left eval  Hip flexion 70standing 70 standing *  Hip extension    Hip abduction    Hip adduction    Hip internal rotation    Hip external rotation    Knee flexion    Knee extension    Ankle dorsiflexion    Ankle plantarflexion    Ankle inversion    Ankle eversion    (Blank rows = not tested, score listed is out of 5 possible points.  N = WNL, D = diminished, C = clear for gross weakness with myotome testing, * = concordant pain with testing)   LOWER EXTREMITY MMT:    MMT Right eval Left eval  Hip flexion 4+ 4+  Hip extension 4 4  Hip abduction 4 4-  Hip adduction    Hip internal rotation    Hip external rotation    Knee flexion 4+ 4+  Knee extension 4+ 4-  Ankle dorsiflexion    Ankle plantarflexion    Ankle inversion    Ankle eversion     (Blank rows = not tested)  LUMBAR SPECIAL TESTS:  Straight leg raise test:  Negative, Slump test: Negative, and FABER test: Negative But pt with limited ER bil FUNCTIONAL TESTS:  5 times sit to stand: 11.26 sec 2 minute walk test: TBD 08/09/23: 6 MWT :1527 feet no increased pain  GAIT: Distance walked: 150 to clinic Assistive device utilized: None Level of assistance: Complete Independence Comments: Pt with  slight antalgic gait disturbance  [facility] Adult PT Treatment:                                                DATE: 08-11-23 Therapeutic Exercise: Nu step with UE/LE for 5 min workload  Reviewed all HEP for correct as below Supine Pelvic Tilt  SKTC LTR Supine Sciatic Nerve Glide   Supine Bridge  - 1 x daily  Hooklying Clamshell with GTB  Sidelying Open Book Thoracic Rotation with Knee on Foam Rol Supine March with Hands Behind Backl    Therapeutic Activity: Reviewed body mechanics for lifting and household chores with demo example of sweeping and lifting Demo  of putting food in low oven as she has at home with VC and TC  Self Care: Posture sitting and standing Posture and body mechanics for household chores and back protection and relief of pressure with common household chores  Handout provided and went over all information.    [facility] Adult PT Treatment:                                                DATE: 08/09/23 Therapeutic Exercise: Bridge 10 x 2  Supine Marching with hands behind back  Supine blue band clam while maintaining PPT  Supine alternating bent knee fall out with BTB SLR with plates ring x 10 each   Therapeutic Activity: 6 MWT: 1527 feet    [facility] Adult PT Treatment:                                                DATE: 08-04-23 Therapeutic Exercise: Nu step UE/LE L5 54m while taking subjective  PPT 20x  SKTC - no need for modification today 5 x 10 second R and L LTR 3 x 20 sec each Sciatic nerve glide/active HS stretch Bridge with ball 2 x 10 3/4 range Hook lying GTB  Clamshell 2 x 10 Supine march with hands behind back to  encourage PPT 2 x 10  Self Care: DOMS  explanation  Correct diaphragmatic breathing  coordinating with exercise [facility] Adult PT Treatment  08/02/2023:  Therapeutic Exercise:  nu-step L5 52m while taking subjective and planning session with patient Reviewing HEP LTR SKTC - modified with pillowcase as stretch strap Sciatic nerve glide/active HS stretch Bridge - small range - 2x10 Education regarding appropriate pain levels and progressive overload S/L clam - GTB - 2x10 Alternating SLR from foam roller - 2x10    HOME EXERCISE PROGRAM: Access Code: [email or web address] URL: [email or web address] Date: 08/02/2023 Prepared by: Lesleigh Rash  Exercises - Supine Pelvic Tilt  - 1 x daily - 7 x weekly - 3 sets - 10 reps - Supine Single Knee to Chest Stretch  - 2 x daily - 7 x weekly - 1 sets - 5 reps - 10 hold - Supine Lower Trunk Rotation  - 2 x daily - 7 x weekly - 1 sets - 5 reps - 20 hold - Supine Sciatic Nerve Glide  - 1 x daily - 7 x weekly - 3 sets - 10 reps - Supine Bridge  - 1 x daily - 7 x weekly - 3 sets - 10 reps - 2-3 seconds hold - Hooklying Clamshell with Resistance  - 1 x daily - 7 x weekly - 3 sets - 10 reps Added 08-04-23 - Supine March with Hands Behind Back  - 1 x daily - 7 x weekly - 3 sets - 10 reps - Sidelying Open Book Thoracic Rotation with Knee on Foam Roll  - 1 x daily - 7 x weekly - 2 sets - 10 reps ASSESSMENT:  CLINICAL IMPRESSION:  08-04-23  Cass tolerated session well with no adverse reaction. Pt reports no pain but some pain doing exercises. Pt HEP reviewed and VC and TC for correct execution. Pt also educated and reviewed lifting and posture and body mechanics for household chores.HEP focusing on lumbar  stabilization with cues on how to maintain PPT and continued breathing continued from last session.  No increased pain during session. PT left with all questions answered from pt perspective.  EVAL: Patient is a 58 y.o. female who was seen today  for physical therapy evaluation and treatment for left sided low back pain with left radiculopathy and thoracic back pain. Pt formerly seen with bil TKA and benefited from PT.  Pt is routinely active and had controlled her 4 years of back pain with exercise but in last 2 months pt is unable to alleviate pain with exercise alone.  Pt is motivated to exercise but has not been able to return to gym in 2 and 1/2 months. Pt will benefit from skilled PT to address impairments and ongoing intermittent pain  OBJECTIVE IMPAIRMENTS: decreased activity tolerance, decreased mobility, difficulty walking, decreased ROM, decreased strength, increased muscle spasms, improper body mechanics, postural dysfunction, obesity, and pain.   ACTIVITY LIMITATIONS: carrying, lifting, bending, sitting, standing, squatting, reach over head, and locomotion level  PARTICIPATION LIMITATIONS: meal prep, cleaning, laundry, driving, and community activity  PERSONAL FACTORS: OA bil knees, GERD, hyperlipidemia, TKA bil for genu knee varus. Dec 2015 bil, appy, are also affecting patient's functional outcome.   REHAB POTENTIAL: Good  CLINICAL DECISION MAKING: Evolving/moderate complexity  EVALUATION COMPLEXITY: Moderate   GOALS: Goals reviewed with patient? Yes  SHORT TERM GOALS: Target date: 08-16-23  Pt will be I with initial HEP Baseline:no knowledge 08-11-23 I was better with my exercises when I started reading them.  The videos are in English Goal status: Ongoing  2.  Report pain decrease from 10/10 to 5/10 Baseline: 10/10 at worst 08-04-23  2/10 08-11-23  0/10 pain but only occasionally Goal status: ONGOING  3.  Demonstrate understanding of neutral posture and be more conscious of position and posture throughout the day.  Baseline: limited knowledge 08-11-23 Goal status: MET  4.  Pt will be able to increase lumbar rotation  and lumbar flexion in order to comfortable perform household chores Baseline: See [facility]  chart 08-11-23   Goal status: ONGOING    LONG TERM GOALS: Target date: 09-10-23  Pt will be independent with advanced HEP Baseline: no knowledge Goal status: INITIAL  2.  Pt will be able to perform planks for 30 seconds without increasing pai Baseline: 10 sec max Goal status: INITIAL  3.  Pt will use household cooking equipment  for making tamales about 15 #  Baseline: unable to lift any weight Goal status: INITIAL  4.  Pt will improve ODI to at least 18% Baseline: eval 30% Goal status: INITIAL  5.  Demonstrate and verbalize techniques to reduce the risk of re-injury including: lifting, posture, body mechanics.  Baseline: no knowledge Goal status: INITIAL  6.  Pt will be able to perform within normal range for age [phone]) Baseline: NT at eval 08/09/23: 1527 feet  Goal status: ONGOING  PLAN:  PT FREQUENCY: 1-2x/week  PT DURATION: 6 weeks  PLANNED INTERVENTIONS: 97164- PT Re-evaluation, 97110-Therapeutic exercises, 97530- Therapeutic activity, 97112- Neuromuscular re-education, 97535- Self Care, 54098- Manual therapy, [phone]- Gait training, [phone]- Electrical stimulation (manual), Patient/Family education, Balance training, Stair training, Taping, Dry Needling, Joint mobilization, Spinal mobilization, Cryotherapy, and Moist heat.  PLAN FOR NEXT SESSION: possible TPDN,  progress exercise and capacity to carry household equipment and return to gym for exercise     Sharlet Dawson, PT, [facility] Certified Exercise Expert for the Aging Adult  08/11/23 9:37 AM Phone: [phone] Fax:  [phone]  

## 2023-08-11 ENCOUNTER — Encounter: Payer: Self-pay | Admitting: Physical Therapy

## 2023-08-11 ENCOUNTER — Ambulatory Visit: Payer: Self-pay | Admitting: Physical Therapy

## 2023-08-11 DIAGNOSIS — G8929 Other chronic pain: Secondary | ICD-10-CM

## 2023-08-11 DIAGNOSIS — M6281 Muscle weakness (generalized): Secondary | ICD-10-CM

## 2023-08-11 DIAGNOSIS — M546 Pain in thoracic spine: Secondary | ICD-10-CM

## 2023-08-11 NOTE — Patient Instructions (Signed)
 Posture Tips DO: - stand tall and erect - keep chin tucked in - keep head and shoulders in alignment - check posture regularly in mirror or large window - pull head back against headrest in car seat;  Change your position often.  Sit with lumbar support. DON'T: - slouch or slump while watching TV or reading - sit, stand or lie in one position  for too long;  Sitting is especially hard on the spine so if you sit at a desk/use the computer, then stand up often!   Copyright  VHI. All rights reserved.  Posture - Standing   Good posture is important. Avoid slouching and forward head thrust. Maintain curve in low back and align ears over shoul- ders, hips over ankles.  Pull your belly button in toward your back bone.   Copyright  VHI. All rights reserved.  Posture - Sitting   Sit upright, head facing forward. Try using a roll to support lower back. Keep shoulders relaxed, and avoid rounded back. Keep hips level with knees. Avoid crossing legs for long periods.   Copyright  VHI. All rights reserved.   Sleeping on Back  Place pillow under knees. A pillow with cervical support and a roll around waist are also helpful. Copyright  VHI. All rights reserved.  Sleeping on Side Place pillow between knees. Use cervical support under neck and a roll around waist as needed. Copyright  VHI. All rights reserved.   Sleeping on Stomach   If this is the only desirable sleeping position, place pillow under lower legs, and under stomach or chest as needed.  Posture - Sitting   Sit upright, head facing forward. Try using a roll to support lower back. Keep shoulders relaxed, and avoid rounded back. Keep hips level with knees. Avoid crossing legs for long periods. Stand to Sit / Sit to Stand   To sit: Bend knees to lower self onto front edge of chair, then scoot back on seat. To stand: Reverse sequence by placing one foot forward, and scoot to front of seat. Use rocking motion to stand up.   Work  Height and Reach  Ideal work height is no more than 2 to 4 inches below elbow level when standing, and at elbow level when sitting. Reaching should be limited to arm's length, with elbows slightly bent.  Bending  Bend at hips and knees, not back. Keep feet shoulder-width apart.    Posture - Standing   Good posture is important. Avoid slouching and forward head thrust. Maintain curve in low back and align ears over shoul- ders, hips over ankles.  Alternating Positions   Alternate tasks and change positions frequently to reduce fatigue and muscle tension. Take rest breaks. Computer Work   Position work to Art gallery manager. Use proper work and seat height. Keep shoulders back and down, wrists straight, and elbows at right angles. Use chair that provides full back support. Add footrest and lumbar roll as needed.  Getting Into / Out of Car  Lower self onto seat, scoot back, then bring in one leg at a time. Reverse sequence to get out.  Dressing  Lie on back to pull socks or slacks over feet, or sit and bend leg while keeping back straight.    Housework - Sink  Place one foot on ledge of cabinet under sink when standing at sink for prolonged periods.   Pushing / Pulling  Pushing is preferable to pulling. Keep back in proper alignment, and use leg muscles to do  the work.  Deep Squat   Squat and lift with both arms held against upper trunk. Tighten stomach muscles without holding breath. Use smooth movements to avoid jerking.  Avoid Twisting   Avoid twisting or bending back. Pivot around using foot movements, and bend at knees if needed when reaching for articles.  Carrying Luggage   Distribute weight evenly on both sides. Use a cart whenever possible. Do not twist trunk. Move body as a unit.   Lifting Principles Maintain proper posture and head alignment. Slide object as close as possible before lifting. Move obstacles out of the way. Test before lifting; ask for  help if too heavy. Tighten stomach muscles without holding breath. Use smooth movements; do not jerk. Use legs to do the work, and pivot with feet. Distribute the work load symmetrically and close to the center of trunk. Push instead of pull whenever possible.   Ask For Help   Ask for help and delegate to others when possible. Coordinate your movements when lifting together, and maintain the low back curve.  Log Roll   Lying on back, bend left knee and place left arm across chest. Roll all in one movement to the right. Reverse to roll to the left. Always move as one unit. Housework - Sweeping  Use long-handled equipment to avoid stooping.   Housework - Wiping  Position yourself as close as possible to reach work surface. Avoid straining your back.  Laundry - Unloading Wash   To unload small items at bottom of washer, lift leg opposite to arm being used to reach.  Gardening - Raking  Move close to area to be raked. Use arm movements to do the work. Keep back straight and avoid twisting.     Cart  When reaching into cart with one arm, lift opposite leg to keep back straight.   Getting Into / Out of Bed  Lower self to lie down on one side by raising legs and lowering head at the same time. Use arms to assist moving without twisting. Bend both knees to roll onto back if desired. To sit up, start from lying on side, and use same move-ments in reverse. Housework - Vacuuming  Hold the vacuum with arm held at side. Step back and forth to move it, keeping head up. Avoid twisting.   Laundry - Armed forces training and education officer so that bending and twisting can be avoided.   Laundry - Unloading Dryer  Squat down to reach into clothes dryer or use a reacher.  Gardening - Weeding / Psychiatric nurse or Kneel. Knee pads may be helpful.                    Tina Johnson, PT, [facility] Certified Exercise Expert for the Aging Adult  08/11/23 9:04 AM Phone:  [phone] Fax: [phone]

## 2023-08-16 ENCOUNTER — Ambulatory Visit: Payer: Self-pay | Admitting: Physical Therapy

## 2023-08-16 ENCOUNTER — Encounter: Payer: Self-pay | Admitting: Physical Therapy

## 2023-08-16 DIAGNOSIS — M546 Pain in thoracic spine: Secondary | ICD-10-CM

## 2023-08-16 DIAGNOSIS — M6281 Muscle weakness (generalized): Secondary | ICD-10-CM

## 2023-08-16 DIAGNOSIS — G8929 Other chronic pain: Secondary | ICD-10-CM

## 2023-08-16 NOTE — Therapy (Signed)
 OUTPATIENT PHYSICAL THERAPY DAILY NOTE   Patient Name: Tina Johnson MRN: [id number] DOB:July 18, 1965, 58 y.o., female Today's Date: 08/16/2023  END OF SESSION:  PT End of Session - 08/16/23 0851     Visit Number 6    Number of Visits 12    Date for PT Re-Evaluation 09/06/23    Authorization Type CAFA    PT Start Time 0848    PT Stop Time 0930    PT Time Calculation (min) 42 min               Past Medical History:  Diagnosis Date   Abnormality of gait 04/26/2014   Anxiety    controls with exercise    Arthritis    knees , shoulders   Bradycardia 05/31/2017   DEPRESSION 08/14/2007   Qualifier: Diagnosis of  By: Zara Heymann     Family history of diabetes mellitus (DM) 01/23/2014   Genu varum of both lower extremities 03/07/2014   GERD 08/14/2007   Qualifier: Diagnosis of  By: Zara Heymann     HEADACHE, CHRONIC 08/14/2007   Qualifier: Diagnosis of  By: Zara Heymann     HYPERLIPIDEMIA 08/14/2007   Qualifier: Diagnosis of  By: Zara Heymann     Indigestion    no medicine at present   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   Neoplasm of appendix    OSTEOARTHRITIS 08/14/2007   Qualifier: Diagnosis of  By: Zara Heymann     Other bilateral secondary osteoarthritis of knee 04/03/2015   PONV (postoperative nausea and vomiting) 1999   partial hysterectomy in [place]   Right knee DJD 01/23/2014   S/P TKR (total knee replacement), bilateral 10/07/2016   Past Surgical History:  Procedure Laterality Date   ABDOMINAL HYSTERECTOMY     APPENDECTOMY     JOINT REPLACEMENT Bilateral 2016   [place]   LAPAROSCOPIC APPENDECTOMY N/A 05/02/2017   Procedure: LAPAROSCOPIC CECECTOMY;  Surgeon: Alben Alma, MD;  Location: [facility] ORS;  Service: General;  Laterality: N/A;  Please open Lap appy tray.  Have gelport and Right hemicolectomy cart in the room.    LAPAROSCOPIC RIGHT HEMI COLECTOMY Right 09/26/2017   Procedure: LAPAROSCOPIC RIGHT HEMI  COLECTOMY;  Surgeon: Alben Alma, MD;  Location: [facility] OR[facility];  Service: General;  Laterality: Right;   TOTAL ABDOMINAL HYSTERECTOMY W/ BILATERAL SALPINGOOPHORECTOMY  2001   done at [facility] hosp   TOTAL KNEE ARTHROPLASTY Right 04/03/2015   Procedure: RIGHT TOTAL KNEE ARTHROPLASTY;  Surgeon: Wes Hamman, MD;  Location: [facility] OR;  Service: Orthopedics;  Laterality: Right;   TOTAL KNEE ARTHROPLASTY Left 04/03/2015   Procedure: LEFT TOTAL KNEE ARTHROPLASTY;  Surgeon: Wes Hamman, MD;  Location: [facility] OR;  Service: Orthopedics;  Laterality: Left;   VAGINAL DELIVERY     ?x2   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   Bradycardia 05/31/2017   S/P TKR (total knee replacement), bilateral 10/07/2016   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   Genu varum of both lower extremities 03/07/2014   Family history of diabetes mellitus (DM) 01/23/2014   Right knee DJD 01/23/2014   Hyperlipidemia 01/23/2014   HYPERLIPIDEMIA 08/14/2007   DEPRESSION 08/14/2007   GERD 08/14/2007   Headache 08/14/2007    PCP: Lincoln Renshaw,  Rexine Cater, MD   REFERRING PROVIDER: Jude Norton, MD   REFERRING DIAG: [phone] (ICD-10-CM) - Chronic low back pain, unspecified back pain laterality, unspecified whether sciatica present   Rationale for Evaluation and Treatment: Rehabilitation  THERAPY DIAG:  Chronic left-sided low back pain with left-sided sciatica  Pain in thoracic spine  Muscle weakness (generalized)  ONSET DATE: onset 4 years ago but more recently exacerbating pain in last 2 months  SUBJECTIVE:                                                                                                                                                                                           SUBJECTIVE STATEMENT:  In person  interpreter present throughout reporting 0/10 pain now. "I was very active and I had a little pain and took a warm shower and I felt better" I am washing dishes and doing some housework but not mopping the floor   EVAL: Through interpreter, pt explains she has had pain for about 4 years but she did exercise and it helped her pain but in the last 2 months it has gotten worse and exercise don't help. After 2 hours of sitting pain begins.  Pain is worse when she is sitting  but walking is better for pain. Stand is OK but she is having more pain down her left side. She went the whole day mostly standing and last night when she was lying down and husband massaging her left lower leg the pain went away in her back. 4 years ago her back pain started with no incident.  More in low back and upper back  PERTINENT HISTORY:  OA bil knees, GERD, hyperlipidemia, TKA bil for genu knee varus. Dec 2015 bil, appy,   PAIN:  Are you having pain? Yes: NPRS scale: at rest 7/10 now and at worst 10/10  Pain location: thoracic  and low back Pain description: spasming achy pain Aggravating factors: lifting household items like appliances to make tamales  about 14-15 pounds, sometimes disrupts sleep Relieving factors: movement makes better  PRECAUTIONS: None  RED FLAGS: None   WEIGHT BEARING RESTRICTIONS: No  FALLS:  Has patient fallen in last 6 months? 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  DIAGNOSTIC FINDINGS:  2 view x-rays of the  lumbar spine show lordosis of the lumbar spine, there  is some noted retrolisthesis of L4-L5.  Some noted  spurring throughout the  lumbar spine and mild sclerotic changes.  Disc height does appear to be  narrower at L4-L5 and L1-L2.  Patient also noted to have some mild  thoracic disc height narrowing  See medical records for x rays  PATIENT SURVEYS:  Modified Oswestry 30%   COGNITION: Overall cognitive status: Within functional limits for tasks assessed     SENSATION: WFL  MUSCLE LENGTH: Hamstrings: slight tightness L > R   POSTURE: rounded shoulders, forward head, and pelvic level higher on left side  PALPATION: Pt with TTP over thoracic lumbar paraspinals  LUMBAR ROM:   AROM eval 08/16/23  Flexion Fingertips to ankles * Finger tips to ankles, no back pain   Extension 50% decrease *   Right lateral flexion WFL   Left lateral flexion WFL   Right rotation 50% 75%  Left rotation 50% 75%   Key: WFL = within functional limits not formally assessed, * = concordant pain, s = stiffness/stretching sensation, NT = not tested)  LOWER EXTREMITY ROM:     Active  Right eval Left eval  Hip flexion 70standing 70 standing *  Hip extension    Hip abduction    Hip adduction    Hip internal rotation    Hip external rotation    Knee flexion    Knee extension    Ankle dorsiflexion    Ankle plantarflexion    Ankle inversion    Ankle eversion    (Blank rows = not tested, score listed is out of 5 possible points.  N = WNL, D = diminished, C = clear for gross weakness with myotome testing, * = concordant pain with testing)   LOWER EXTREMITY MMT:    MMT Right eval Left eval  Hip flexion 4+ 4+  Hip extension 4 4  Hip abduction 4 4-  Hip adduction    Hip internal rotation    Hip external rotation    Knee flexion 4+ 4+  Knee extension 4+ 4-  Ankle dorsiflexion    Ankle plantarflexion    Ankle inversion    Ankle eversion     (Blank rows = not tested)  LUMBAR SPECIAL TESTS:  Straight leg raise test: Negative, Slump test: Negative, and FABER test: Negative But pt with limited ER  bil FUNCTIONAL TESTS:  5 times sit to stand: 11.26 sec 2 minute walk test: TBD 08/09/23: 6 MWT :1527 feet no increased pain  GAIT: Distance walked: 150 to clinic Assistive device utilized: None Level of assistance: Complete Independence Comments: Pt with  slight antalgic gait disturbance  [facility] Adult PT Treatment:                                                DATE: 08/16/23  Therapeutic Activity: Nustep L5 Ue/Le x 5 minutes  STS x 10 STS with 10# chest press Suit case carry 10# 50 feet x 4 alternating sides  Standing OH press 10# x 10 10# KB lift from floor  Wooden dowel along spine for hip hinge training  90/90 abdominal hold 10 sec x 5  Updated HEP     [facility] Adult PT Treatment:  DATE: 08-11-23 Therapeutic Exercise: Nu step with UE/LE for 5 min workload  Reviewed all HEP for correct as below Supine Pelvic Tilt  SKTC LTR Supine Sciatic Nerve Glide   Supine Bridge  - 1 x daily  Hooklying Clamshell with GTB  Sidelying Open Book Thoracic Rotation with Knee on Foam Rol Supine March with Hands Behind Backl    Therapeutic Activity: Reviewed body mechanics for lifting and household chores with demo example of sweeping and lifting Demo of putting food in low oven as she has at home with VC and TC  Self Care: Posture sitting and standing Posture and body mechanics for household chores and back protection and relief of pressure with common household chores  Handout provided and went over all information.    [facility] Adult PT Treatment:                                                DATE: 08/09/23 Therapeutic Exercise: Bridge 10 x 2  Supine Marching with hands behind back  Supine blue band clam while maintaining PPT  Supine alternating bent knee fall out with BTB SLR with plates ring x 10 each   Therapeutic Activity: 6 MWT: 1527 feet    [facility] Adult PT Treatment:                                                DATE:  08-04-23 Therapeutic Exercise: Nu step UE/LE L5 39m while taking subjective  PPT 20x  SKTC - no need for modification today 5 x 10 second R and L LTR 3 x 20 sec each Sciatic nerve glide/active HS stretch Bridge with ball 2 x 10 3/4 range Hook lying GTB  Clamshell 2 x 10 Supine march with hands behind back to encourage PPT 2 x 10  Self Care: DOMS  explanation  Correct diaphragmatic breathing  coordinating with exercise [facility] Adult PT Treatment  08/02/2023:  Therapeutic Exercise:  nu-step L5 29m while taking subjective and planning session with patient Reviewing HEP LTR SKTC - modified with pillowcase as stretch strap Sciatic nerve glide/active HS stretch Bridge - small range - 2x10 Education regarding appropriate pain levels and progressive overload S/L clam - GTB - 2x10 Alternating SLR from foam roller - 2x10    HOME EXERCISE PROGRAM: Access Code: [email or web address] URL: [email or web address] Date: 08/02/2023 Prepared by: Lesleigh Rash  Exercises - Supine Pelvic Tilt  - 1 x daily - 7 x weekly - 3 sets - 10 reps - Supine Single Knee to Chest Stretch  - 2 x daily - 7 x weekly - 1 sets - 5 reps - 10 hold - Supine Lower Trunk Rotation  - 2 x daily - 7 x weekly - 1 sets - 5 reps - 20 hold - Supine Sciatic Nerve Glide  - 1 x daily - 7 x weekly - 3 sets - 10 reps - Supine Bridge  - 1 x daily - 7 x weekly - 3 sets - 10 reps - 2-3 seconds hold - Hooklying Clamshell with Resistance  - 1 x daily - 7 x weekly - 3 sets - 10 reps Added 08-04-23 - Supine March with Hands Behind Back  - 1 x daily - 7 x weekly -  3 sets - 10 reps - Sidelying Open Book Thoracic Rotation with Knee on Foam Roll  - 1 x daily - 7 x weekly - 2 sets - 10 reps 08/16/23 - Supine 90/90 Abdominal Bracing  - 1 x daily - 7 x weekly - 1 sets - 5 reps - 10 hold - Kettlebell Deadlift  - 1 x daily - 7 x weekly - 1-2 sets - 10 reps  ASSESSMENT:  CLINICAL IMPRESSION: 08/16/23: Pt reports the body mechanics instructions  were beneficial last visit. She has not had any pain for 3-4 days. Her lumbar rotation AROM has improved and pain free. Continued with lifting and carrying with pt requiring mod cues for correct spine alignment which improved with feedback using dowel along spine. She did well with session and reported no increase pain. Progressed lumbar stabilization to 90/90 isometric holds. Updated HEP.    08-04-23  [place] tolerated session well with no adverse reaction. Pt reports no pain but some pain doing exercises. Pt HEP reviewed and VC and TC for correct execution. Pt also educated and reviewed lifting and posture and body mechanics for household chores.HEP focusing on lumbar stabilization with cues on how to maintain PPT and continued breathing continued from last session.  No increased pain during session. PT left with all questions answered from pt perspective.  EVAL: Patient is a 58 y.o. female who was seen today for physical therapy evaluation and treatment for left sided low back pain with left radiculopathy and thoracic back pain. Pt formerly seen with bil TKA and benefited from PT.  Pt is routinely active and had controlled her 4 years of back pain with exercise but in last 2 months pt is unable to alleviate pain with exercise alone.  Pt is motivated to exercise but has not been able to return to gym in 2 and 1/2 months. Pt will benefit from skilled PT to address impairments and ongoing intermittent pain  OBJECTIVE IMPAIRMENTS: decreased activity tolerance, decreased mobility, difficulty walking, decreased ROM, decreased strength, increased muscle spasms, improper body mechanics, postural dysfunction, obesity, and pain.   ACTIVITY LIMITATIONS: carrying, lifting, bending, sitting, standing, squatting, reach over head, and locomotion level  PARTICIPATION LIMITATIONS: meal prep, cleaning, laundry, driving, and community activity  PERSONAL FACTORS: OA bil knees, GERD, hyperlipidemia, TKA bil for genu  knee varus. Dec 2015 bil, appy, are also affecting patient's functional outcome.   REHAB POTENTIAL: Good  CLINICAL DECISION MAKING: Evolving/moderate complexity  EVALUATION COMPLEXITY: Moderate   GOALS: Goals reviewed with patient? Yes  SHORT TERM GOALS: Target date: 08-16-23  Pt will be I with initial HEP Baseline:no knowledge 08-11-23 I was better with my exercises when I started reading them.  The videos are in English Goal status: Ongoing  2.  Report pain decrease from 10/10 to 5/10 Baseline: 10/10 at worst 08-04-23  2/10 08-11-23  0/10 pain but only occasionally Goal status: ONGOING  3.  Demonstrate understanding of neutral posture and be more conscious of position and posture throughout the day.  Baseline: limited knowledge 08-11-23 Goal status: MET  4.  Pt will be able to increase lumbar rotation  and lumbar flexion in order to comfortable perform household chores Baseline: See AROM chart 08/16/23: lumbar rotation improved  Goal status: Partially met     LONG TERM GOALS: Target date: 09-10-23  Pt will be independent with advanced HEP Baseline: no knowledge Goal status: INITIAL  2.  Pt will be able to perform planks for 30 seconds without increasing pai Baseline:  10 sec max Goal status: INITIAL  3.  Pt will use household cooking equipment  for making tamales about 15 #  Baseline: unable to lift any weight Goal status: INITIAL  4.  Pt will improve ODI to at least 18% Baseline: eval 30% Goal status: INITIAL  5.  Demonstrate and verbalize techniques to reduce the risk of re-injury including: lifting, posture, body mechanics.  Baseline: no knowledge Goal status: INITIAL  6.  Pt will be able to perform within normal range for age [phone] ft) Baseline: NT at eval 08/09/23: 1527 feet  Goal status: ONGOING  PLAN:  PT FREQUENCY: 1-2x/week  PT DURATION: 6 weeks  PLANNED INTERVENTIONS: 97164- PT Re-evaluation, 97110-Therapeutic exercises, 97530-  Therapeutic activity, 97112- Neuromuscular re-education, 97535- Self Care, 27253- Manual therapy, [phone]- Gait training, [phone]- Electrical stimulation (manual), Patient/Family education, Balance training, Stair training, Taping, Dry Needling, Joint mobilization, Spinal mobilization, Cryotherapy, and Moist heat.  PLAN FOR NEXT SESSION: possible TPDN,  progress exercise and capacity to carry household equipment and return to gym for exercise     Gasper Karst, PTA 08/16/23 10:44 AM Phone: [phone] Fax: [phone]

## 2023-08-17 NOTE — Therapy (Signed)
 OUTPATIENT PHYSICAL THERAPY DAILY NOTE   Patient Name: Tina Johnson MRN: [id number] DOB:26-Sep-1965, 58 y.o., female Today's Date: 08/18/2023  END OF SESSION:  PT End of Session - 08/18/23 0849     Visit Number 7    Number of Visits 12    Date for PT Re-Evaluation 09/06/23    Authorization Type CAFA    PT Start Time 0846    PT Stop Time 0930    PT Time Calculation (min) 44 min    Activity Tolerance Patient limited by pain;Patient tolerated treatment well    Behavior During Therapy [facility] for tasks assessed/performed                Past Medical History:  Diagnosis Date   Abnormality of gait 04/26/2014   Anxiety    controls with exercise    Arthritis    knees , shoulders   Bradycardia 05/31/2017   DEPRESSION 08/14/2007   Qualifier: Diagnosis of  By: Zara Heymann     Family history of diabetes mellitus (DM) 01/23/2014   Genu varum of both lower extremities 03/07/2014   GERD 08/14/2007   Qualifier: Diagnosis of  By: Zara Heymann     HEADACHE, CHRONIC 08/14/2007   Qualifier: Diagnosis of  By: Zara Heymann     HYPERLIPIDEMIA 08/14/2007   Qualifier: Diagnosis of  By: Milinda Allen, Michele     Indigestion    no medicine at present   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   Neoplasm of appendix    OSTEOARTHRITIS 08/14/2007   Qualifier: Diagnosis of  By: Zara Heymann     Other bilateral secondary osteoarthritis of knee 04/03/2015   PONV (postoperative nausea and vomiting) 1999   partial hysterectomy in [place]   Right knee DJD 01/23/2014   S/P TKR (total knee replacement), bilateral 10/07/2016   Past Surgical History:  Procedure Laterality Date   ABDOMINAL HYSTERECTOMY     APPENDECTOMY     JOINT REPLACEMENT Bilateral 2016   [place]   LAPAROSCOPIC APPENDECTOMY N/A 05/02/2017   Procedure: LAPAROSCOPIC CECECTOMY;  Surgeon: Alben Alma, MD;  Location: [facility] ORS;  Service: General;  Laterality: N/A;  Please open Lap appy tray.  Have  gelport and Right hemicolectomy cart in the room.    LAPAROSCOPIC RIGHT HEMI COLECTOMY Right 09/26/2017   Procedure: LAPAROSCOPIC RIGHT HEMI COLECTOMY;  Surgeon: Alben Alma, MD;  Location: [facility] ORS;  Service: General;  Laterality: Right;   TOTAL ABDOMINAL HYSTERECTOMY W/ BILATERAL SALPINGOOPHORECTOMY  2001   done at [facility] hosp   TOTAL KNEE ARTHROPLASTY Right 04/03/2015   Procedure: RIGHT TOTAL KNEE ARTHROPLASTY;  Surgeon: Wes Hamman, MD;  Location: [facility] OR;  Service: Orthopedics;  Laterality: Right;   TOTAL KNEE ARTHROPLASTY Left 04/03/2015   Procedure: LEFT TOTAL KNEE ARTHROPLASTY;  Surgeon: Wes Hamman, MD;  Location: [facility] OR;  Service: Orthopedics;  Laterality: Left;   VAGINAL DELIVERY     ?x2   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   Bradycardia 05/31/2017   S/P TKR (total knee replacement), bilateral 10/07/2016   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   Genu varum of both lower extremities 03/07/2014   Family history of diabetes mellitus (DM) 01/23/2014   Right knee DJD 01/23/2014  Hyperlipidemia 01/23/2014   HYPERLIPIDEMIA 08/14/2007   DEPRESSION 08/14/2007   GERD 08/14/2007   Headache 08/14/2007    PCP: Lawrance Presume, MD   REFERRING PROVIDER: Jude Norton, MD   REFERRING DIAG: [phone] (ICD-10-CM) - Chronic low back pain, unspecified back pain laterality, unspecified whether sciatica present   Rationale for Evaluation and Treatment: Rehabilitation  THERAPY DIAG:  Chronic left-sided low back pain with left-sided sciatica  Pain in thoracic spine  Muscle weakness (generalized)  ONSET DATE: onset 4 years ago but more recently exacerbating pain in last 2 months  SUBJECTIVE:                                                                                                                                                                                            SUBJECTIVE STATEMENT:  In person interpreter present throughout reporting 2/10 pain now on left shoulder blade and spine.    EVAL: Through interpreter, pt explains she has had pain for about 4 years but she did exercise and it helped her pain but in the last 2 months it has gotten worse and exercise don't help. After 2 hours of sitting pain begins.  Pain is worse when she is sitting  but walking is better for pain. Stand is OK but she is having more pain down her left side. She went the whole day mostly standing and last night when she was lying down and husband massaging her left lower leg the pain went away in her back. 4 years ago her back pain started with no incident.  More in low back and upper back  PERTINENT HISTORY:  OA bil knees, GERD, hyperlipidemia, TKA bil for genu knee varus. Dec 2015 bil, appy,   PAIN:  Are you having pain? Yes: NPRS scale: at rest 7/10 now and at worst 10/10  Pain location: thoracic  and low back Pain description: spasming achy pain Aggravating factors: lifting household items like appliances to make tamales  about 14-15 pounds, sometimes disrupts sleep Relieving factors: movement makes better  PRECAUTIONS: None  RED FLAGS: None   WEIGHT BEARING RESTRICTIONS: No  FALLS:  Has patient fallen in last 6 months? 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  DIAGNOSTIC FINDINGS:  2 view x-rays of the lumbar spine  show lordosis of the lumbar spine, there  is some noted retrolisthesis of L4-L5.   Some noted spurring throughout the  lumbar spine and mild sclerotic changes.  Disc height does appear to be  narrower at L4-L5 and L1-L2.  Patient also noted to have some mild  thoracic disc height narrowing  See medical records for x rays  PATIENT SURVEYS:  Modified Oswestry 30%   COGNITION: Overall cognitive status: Within functional limits for tasks assessed     SENSATION: WFL  MUSCLE LENGTH: Hamstrings: slight tightness L > R   POSTURE: rounded shoulders, forward head, and pelvic level higher on left side  PALPATION: Pt with TTP over thoracic lumbar paraspinals  LUMBAR ROM:   [facility] eval 08/16/23  Flexion Fingertips to ankles * Finger tips to ankles, no back pain   Extension 50% decrease *   Right lateral flexion WFL   Left lateral flexion WFL   Right rotation 50% 75%  Left rotation 50% 75%   Key: WFL = within functional limits not formally assessed, * = concordant pain, s = stiffness/stretching sensation, NT = not tested)  LOWER EXTREMITY ROM:     Active  Right eval Left eval  Hip flexion 70standing 70 standing *  Hip extension    Hip abduction    Hip adduction    Hip internal rotation    Hip external rotation    Knee flexion    Knee extension    Ankle dorsiflexion    Ankle plantarflexion    Ankle inversion    Ankle eversion    (Blank rows = not tested, score listed is out of 5 possible points.  N = WNL, D = diminished, C = clear for gross weakness with myotome testing, * = concordant pain with testing)   LOWER EXTREMITY MMT:    MMT Right eval Left eval  Hip flexion 4+ 4+  Hip extension 4 4  Hip abduction 4 4-  Hip adduction    Hip internal rotation    Hip external rotation    Knee flexion 4+ 4+  Knee extension 4+ 4-  Ankle dorsiflexion    Ankle plantarflexion    Ankle inversion    Ankle eversion     (Blank rows = not tested)  LUMBAR SPECIAL TESTS:  Straight leg raise test: Negative, Slump test: Negative, and FABER test: Negative But pt with  limited ER bil FUNCTIONAL TESTS:  5 times sit to stand: 11.26 sec 2 minute walk test: TBD 08/09/23: 6 MWT :1527 feet no increased pain  GAIT: Distance walked: 150 to clinic Assistive device utilized: None Level of assistance: Complete Independence Comments: Pt with  slight antalgic gait disturbance   [facility] Adult PT Treatment:                                                DATE: 08-18-23 Therapeutic Exercise: Thoracic book opening side-lying on left and right 10 x 90/90 abdominal hold 10 sec x 5  LTR  Therapeutic Activity: Nustep L5 Ue/Le x 5 minutes  Hip hinge STS with   15 # KB x 10 Deadlift with 15 #  5 x 2 followed by standing extension stretch Deadlift  with 25 #  5 x 2  followed by standing extension stretch Suit case carry 15 # 50 feet x 4 alternating sides   Self Care: Self spinal  mobs using double tennis ball over thoracic spine and Left shoulder blade between spine and scapula Return demo by PT   Lifting principles reinforced  [facility] Adult PT Treatment:                                                DATE: 08/16/23  Therapeutic Activity: Nustep L5 Ue/Le x 5 minutes  STS x 10 STS with 10# chest press Suit case carry 10# 50 feet x 4 alternating sides  Standing OH press 10# x 10 10# KB lift from floor  Wooden dowel along spine for hip hinge training  90/90 abdominal hold 10 sec x 5  Updated HEP     [facility] Adult PT Treatment:                                                DATE: 08-11-23 Therapeutic Exercise: Nu step with UE/LE for 5 min workload  Reviewed all HEP for correct as below Supine Pelvic Tilt  SKTC LTR Supine Sciatic Nerve Glide   Supine Bridge  - 1 x daily  Hooklying Clamshell with GTB  Sidelying Open Book Thoracic Rotation with Knee on Foam Rol Supine March with Hands Behind Backl    Therapeutic Activity: Reviewed body mechanics for lifting and household chores with demo example of sweeping and lifting Demo of putting food in low oven as she has at  home with VC and TC  Self Care: Posture sitting and standing Posture and body mechanics for household chores and back protection and relief of pressure with common household chores  Handout provided and went over all information.    [facility] Adult PT Treatment:                                                DATE: 08/09/23 Therapeutic Exercise: Bridge 10 x 2  Supine Marching with hands behind back  Supine blue band clam while maintaining PPT  Supine alternating bent knee fall out with BTB SLR with plates ring x 10 each   Therapeutic Activity: 6 MWT: 1527 feet    [facility] Adult PT Treatment:                                                DATE: 08-04-23 Therapeutic Exercise: Nu step UE/LE L5 23m while taking subjective  PPT 20x  SKTC - no need for modification today 5 x 10 second R and L LTR 3 x 20 sec each Sciatic nerve glide/active HS stretch Bridge with ball 2 x 10 3/4 range Hook lying GTB  Clamshell 2 x 10 Supine march with hands behind back to encourage PPT 2 x 10  Self Care: DOMS  explanation  Correct diaphragmatic breathing  coordinating with exercise [facility] Adult PT Treatment  08/02/2023:  Therapeutic Exercise:  nu-step L5 63m while taking subjective and planning session with patient Reviewing HEP LTR SKTC - modified with pillowcase as stretch strap Sciatic nerve glide/active HS stretch  Bridge - small range - 2x10 Education regarding appropriate pain levels and progressive overload S/L clam - GTB - 2x10 Alternating SLR from foam roller - 2x10    HOME EXERCISE PROGRAM: Access Code: [email or web address] URL: [email or web address] Date: 08/02/2023 Prepared by: Lesleigh Rash  Exercises - Supine Pelvic Tilt  - 1 x daily - 7 x weekly - 3 sets - 10 reps - Supine Single Knee to Chest Stretch  - 2 x daily - 7 x weekly - 1 sets - 5 reps - 10 hold - Supine Lower Trunk Rotation  - 2 x daily - 7 x weekly - 1 sets - 5 reps - 20 hold - Supine Sciatic Nerve Glide  - 1 x daily - 7  x weekly - 3 sets - 10 reps - Supine Bridge  - 1 x daily - 7 x weekly - 3 sets - 10 reps - 2-3 seconds hold - Hooklying Clamshell with Resistance  - 1 x daily - 7 x weekly - 3 sets - 10 reps Added 08-04-23 - Supine March with Hands Behind Back  - 1 x daily - 7 x weekly - 3 sets - 10 reps - Sidelying Open Book Thoracic Rotation with Knee on Foam Roll  - 1 x daily - 7 x weekly - 2 sets - 10 reps 08/16/23 - Supine 90/90 Abdominal Bracing  - 1 x daily - 7 x weekly - 1 sets - 5 reps - 10 hold - Kettlebell Deadlift  - 1 x daily - 7 x weekly - 1-2 sets - 10 reps  ASSESSMENT:  CLINICAL IMPRESSION: 08/16/23: Pt educated and reinforced body mechanics for deadlifting and given double tennis ball to self mobilized thoracic spine.  Pt reinforced body mechanics and continued exercises for decreased of minimal pain at initiation of session.  0/10 at end.Continued with lifting and carrying with pt requiring mod cues for correct spine alignment. She did well with session and reported no increase pain. Pt left with all questions answered.   08-04-23  Makeisha tolerated session well with no adverse reaction. Pt reports no pain but some pain doing exercises. Pt HEP reviewed and VC and TC for correct execution. Pt also educated and reviewed lifting and posture and body mechanics for household chores.HEP focusing on lumbar stabilization with cues on how to maintain PPT and continued breathing continued from last session.  No increased pain during session. PT left with all questions answered from pt perspective.  EVAL: Patient is a 58 y.o. female who was seen today for physical therapy evaluation and treatment for left sided low back pain with left radiculopathy and thoracic back pain. Pt formerly seen with bil TKA and benefited from PT.  Pt is routinely active and had controlled her 4 years of back pain with exercise but in last 2 months pt is unable to alleviate pain with exercise alone.  Pt is motivated to exercise but  has not been able to return to gym in 2 and 1/2 months. Pt will benefit from skilled PT to address impairments and ongoing intermittent pain  OBJECTIVE IMPAIRMENTS: decreased activity tolerance, decreased mobility, difficulty walking, decreased ROM, decreased strength, increased muscle spasms, improper body mechanics, postural dysfunction, obesity, and pain.   ACTIVITY LIMITATIONS: carrying, lifting, bending, sitting, standing, squatting, reach over head, and locomotion level  PARTICIPATION LIMITATIONS: meal prep, cleaning, laundry, driving, and community activity  PERSONAL FACTORS: OA bil knees, GERD, hyperlipidemia, TKA bil for genu knee varus. Dec 2015 bil, appy, are also affecting patient's functional  outcome.   REHAB POTENTIAL: Good  CLINICAL DECISION MAKING: Evolving/moderate complexity  EVALUATION COMPLEXITY: Moderate   GOALS: Goals reviewed with patient? Yes  SHORT TERM GOALS: Target date: 08-16-23  Pt will be I with initial HEP Baseline:no knowledge 08-11-23 I was better with my exercises when I started reading them.  The videos are in English Goal status: Ongoing  2.  Report pain decrease from 10/10 to 5/10 Baseline: 10/10 at worst 08-04-23  2/10 08-11-23  0/10 pain but only occasionally Goal status: ONGOING  3.  Demonstrate understanding of neutral posture and be more conscious of position and posture throughout the day.  Baseline: limited knowledge 08-11-23 Goal status: MET  4.  Pt will be able to increase lumbar rotation  and lumbar flexion in order to comfortable perform household chores Baseline: See [facility] chart 08/16/23: lumbar rotation improved  Goal status: Partially met     LONG TERM GOALS: Target date: 09-10-23  Pt will be independent with advanced HEP Baseline: no knowledge Goal status: INITIAL  2.  Pt will be able to perform planks for 30 seconds without increasing pai Baseline: 10 sec max Goal status: INITIAL  3.  Pt will use household cooking  equipment  for making tamales about 15 #  Baseline: unable to lift any weight Goal status: INITIAL  4.  Pt will improve ODI to at least 18% Baseline: eval 30% Goal status: INITIAL  5.  Demonstrate and verbalize techniques to reduce the risk of re-injury including: lifting, posture, body mechanics.  Baseline: no knowledge Goal status: INITIAL  6.  Pt will be able to perform within normal range for age [phone] ft) Baseline: NT at eval 08/09/23: 1527 feet  Goal status: ONGOING  PLAN:  PT FREQUENCY: 1-2x/week  PT DURATION: 6 weeks  PLANNED INTERVENTIONS: 97164- PT Re-evaluation, 97110-Therapeutic exercises, 97530- Therapeutic activity, 97112- Neuromuscular re-education, 97535- Self Care, 54098- Manual therapy, [phone]- Gait training, [phone]- Electrical stimulation (manual), Patient/Family education, Balance training, Stair training, Taping, Dry Needling, Joint mobilization, Spinal mobilization, Cryotherapy, and Moist heat.  PLAN FOR NEXT SESSION: possible TPDN,  progress exercise and capacity to carry household equipment and return to gym for exercise     Sharlet Dawson, PT, [facility] Certified Exercise Expert for the Aging Adult  08/18/23 9:34 AM Phone: [phone] Fax: [phone]

## 2023-08-18 ENCOUNTER — Ambulatory Visit: Payer: Self-pay | Attending: Family Medicine | Admitting: Physical Therapy

## 2023-08-18 ENCOUNTER — Other Ambulatory Visit: Payer: Self-pay

## 2023-08-18 DIAGNOSIS — M6281 Muscle weakness (generalized): Secondary | ICD-10-CM | POA: Insufficient documentation

## 2023-08-18 DIAGNOSIS — G8929 Other chronic pain: Secondary | ICD-10-CM | POA: Insufficient documentation

## 2023-08-18 DIAGNOSIS — M5442 Lumbago with sciatica, left side: Secondary | ICD-10-CM | POA: Insufficient documentation

## 2023-08-18 DIAGNOSIS — M546 Pain in thoracic spine: Secondary | ICD-10-CM | POA: Insufficient documentation

## 2023-08-22 ENCOUNTER — Other Ambulatory Visit: Payer: Self-pay

## 2023-08-23 ENCOUNTER — Encounter: Payer: Self-pay | Admitting: Physical Therapy

## 2023-08-23 ENCOUNTER — Ambulatory Visit: Payer: Self-pay | Admitting: Physical Therapy

## 2023-08-23 DIAGNOSIS — G8929 Other chronic pain: Secondary | ICD-10-CM

## 2023-08-23 DIAGNOSIS — M6281 Muscle weakness (generalized): Secondary | ICD-10-CM

## 2023-08-23 DIAGNOSIS — M546 Pain in thoracic spine: Secondary | ICD-10-CM

## 2023-08-23 NOTE — Therapy (Signed)
 OUTPATIENT PHYSICAL THERAPY DAILY NOTE   Patient Name: Tina Johnson MRN: [id number] DOB:1965/08/11, 58 y.o., female Today's Date: 08/23/2023  END OF SESSION:  PT End of Session - 08/23/23 0849     Visit Number 8    Number of Visits 12    Date for PT Re-Evaluation 09/06/23    Authorization Type CAFA    PT Start Time 0845    PT Stop Time 0930    PT Time Calculation (min) 45 min    Activity Tolerance Patient limited by pain;Patient tolerated treatment well                 Past Medical History:  Diagnosis Date   Abnormality of gait 04/26/2014   Anxiety    controls with exercise    Arthritis    knees , shoulders   Bradycardia 05/31/2017   DEPRESSION 08/14/2007   Qualifier: Diagnosis of  By: Zara Heymann     Family history of diabetes mellitus (DM) 01/23/2014   Genu varum of both lower extremities 03/07/2014   GERD 08/14/2007   Qualifier: Diagnosis of  By: Zara Heymann     HEADACHE, CHRONIC 08/14/2007   Qualifier: Diagnosis of  By: Zara Heymann     HYPERLIPIDEMIA 08/14/2007   Qualifier: Diagnosis of  By: Milinda Allen, Michele     Indigestion    no medicine at present   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   Neoplasm of appendix    OSTEOARTHRITIS 08/14/2007   Qualifier: Diagnosis of  By: Zara Heymann     Other bilateral secondary osteoarthritis of knee 04/03/2015   PONV (postoperative nausea and vomiting) 1999   partial hysterectomy in [place]   Right knee DJD 01/23/2014   S/P TKR (total knee replacement), 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  Have gelport and Right hemicolectomy cart in the room.     LAPAROSCOPIC RIGHT HEMI COLECTOMY Right 09/26/2017   Procedure: LAPAROSCOPIC RIGHT HEMI COLECTOMY;  Surgeon: Alben Alma, MD;  Location: [facility] OR[facility];  Service: General;  Laterality: Right;   TOTAL ABDOMINAL HYSTERECTOMY W/ BILATERAL SALPINGOOPHORECTOMY  2001   done at [facility]   TOTAL KNEE ARTHROPLASTY Right 04/03/2015   Procedure: RIGHT TOTAL KNEE ARTHROPLASTY;  Surgeon: Wes Hamman, MD;  Location: [facility] OR;  Service: Orthopedics;  Laterality: Right;   TOTAL KNEE ARTHROPLASTY Left 04/03/2015   Procedure: LEFT TOTAL KNEE ARTHROPLASTY;  Surgeon: Wes Hamman, MD;  Location: [facility] OR;  Service: Orthopedics;  Laterality: Left;   VAGINAL DELIVERY     ?x2   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   Bradycardia 05/31/2017   S/P TKR (total knee replacement), bilateral 10/07/2016   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   Genu varum of both lower extremities 03/07/2014   Family history of diabetes mellitus (DM) 01/23/2014   Right knee DJD 01/23/2014   Hyperlipidemia 01/23/2014   HYPERLIPIDEMIA 08/14/2007   DEPRESSION  08/14/2007   GERD 08/14/2007   Headache 08/14/2007    PCP: Lawrance Presume, MD   REFERRING PROVIDER: Jude Norton, MD   REFERRING DIAG: [phone] (ICD-10-CM) - Chronic low back pain, unspecified back pain laterality, unspecified whether sciatica present   Rationale for Evaluation and Treatment: Rehabilitation  THERAPY DIAG:  Chronic left-sided low back pain with left-sided sciatica  Pain in thoracic spine  Muscle weakness (generalized)  ONSET DATE: onset 4 years ago but more recently exacerbating pain in last 2 months  SUBJECTIVE:                                                                                                                                                                                            SUBJECTIVE STATEMENT:  In person interpreter present throughout reporting 0/10 pain now on left shoulder blade and spine. I will be going to [employer] for exercise after I leave here.   EVAL: Through interpreter, pt explains she has had pain for about 4 years but she did exercise and it helped her pain but in the last 2 months it has gotten worse and exercise don't help. After 2 hours of sitting pain begins.  Pain is worse when she is sitting  but walking is better for pain. Stand is OK but she is having more pain down her left side. She went the whole day mostly standing and last night when she was lying down and husband massaging her left lower leg the pain went away in her back. 4 years ago her back pain started with no incident.  More in low back and upper back  PERTINENT HISTORY:  OA bil knees, GERD, hyperlipidemia, TKA bil for genu knee varus. Dec 2015 bil, appy,   PAIN:  Are you having pain? Yes: NPRS scale: at rest 7/10 now and at worst 10/10  Pain location: thoracic  and low back Pain description: spasming achy pain Aggravating factors: lifting household items like appliances to make tamales  about 14-15 pounds, sometimes disrupts sleep Relieving factors: movement makes better  PRECAUTIONS: None  RED FLAGS: None   WEIGHT BEARING RESTRICTIONS: No  FALLS:  Has patient fallen in last 6 months? 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  DIAGNOSTIC FINDINGS:  2 view x-rays of  the lumbar spine show lordosis of the lumbar spine, there  is some noted retrolisthesis of  L4-L5.  Some noted spurring throughout the  lumbar spine and mild sclerotic changes.  Disc height does appear to be  narrower at L4-L5 and L1-L2.  Patient also noted to have some mild  thoracic disc height narrowing  See medical records for x rays  PATIENT SURVEYS:  Modified Oswestry 30%   COGNITION: Overall cognitive status: Within functional limits for tasks assessed     SENSATION: WFL  MUSCLE LENGTH: Hamstrings: slight tightness L > R   POSTURE: rounded shoulders, forward head, and pelvic level higher on left side  PALPATION: Pt with TTP over thoracic lumbar paraspinals  LUMBAR ROM:   [facility] eval 08/16/23  Flexion Fingertips to ankles * Finger tips to ankles, no back pain   Extension 50% decrease *   Right lateral flexion WFL   Left lateral flexion WFL   Right rotation 50% 75%  Left rotation 50% 75%   Key: WFL = within functional limits not formally assessed, * = concordant pain, s = stiffness/stretching sensation, NT = not tested)  LOWER EXTREMITY ROM:     Active  Right eval Left eval  Hip flexion 70standing 70 standing *  Hip extension    Hip abduction    Hip adduction    Hip internal rotation    Hip external rotation    Knee flexion    Knee extension    Ankle dorsiflexion    Ankle plantarflexion    Ankle inversion    Ankle eversion    (Blank rows = not tested, score listed is out of 5 possible points.  N = WNL, D = diminished, C = clear for gross weakness with myotome testing, * = concordant pain with testing)   LOWER EXTREMITY MMT:    MMT Right eval Left eval  Hip flexion 4+ 4+  Hip extension 4 4  Hip abduction 4 4-  Hip adduction    Hip internal rotation    Hip external rotation    Knee flexion 4+ 4+  Knee extension 4+ 4-  Ankle dorsiflexion    Ankle plantarflexion    Ankle inversion    Ankle eversion     (Blank rows = not tested)  LUMBAR SPECIAL TESTS:  Straight leg raise test: Negative, Slump test: Negative, and FABER test: Negative But  pt with limited ER bil FUNCTIONAL TESTS:  5 times sit to stand: 11.26 sec 2 minute walk test: TBD 08/09/23: 6 MWT :1527 feet no increased pain  GAIT: Distance walked: 150 to clinic Assistive device utilized: None Level of assistance: Complete Independence Comments: Pt with  slight antalgic gait disturbance  [facility] Adult PT Treatment:                                                DATE: 08-23-23 Therapeutic Exercise: Recumbent Bike 5 minutes STS x 10 STS with 10# chest press Standing OH press 10# x 10 Goblet squat with 15 # Bent over row 10 x on R and on Left updated HEP  Therapeutic Activity: Deadlift ladder  6x each   15#KB, 25# KB, 45# KB followed by back extension and deep breathing Deadlift ladder 4 x each    15#KB, 25# KB, 45# KB followed by back extension and deep breathing Reinforcement of lifting principles Suit case  carry 15 # 50 feet x 4 alternating sides   Self Care: Community wellness opportunities Education on progressive loading with weights with home materials   [facility] Adult PT Treatment:                                                DATE: 08-18-23 Therapeutic Exercise: Thoracic book opening side-lying on left and right 10 x 90/90 abdominal hold 10 sec x 5  LTR  Therapeutic Activity: Nustep L5 Ue/Le x 5 minutes  Hip hinge STS with   15 # KB x 10 Deadlift with 15 #  5 x 2 followed by standing extension stretch Deadlift  with 25 #  5 x 2  followed by standing extension stretch Suit case carry 15 # 50 feet x 4 alternating sides   Self Care: Self spinal mobs using double tennis ball over thoracic spine and Left shoulder blade between spine and scapula Return demo by PT   Lifting principles reinforced  [facility] Adult PT Treatment:                                                DATE: 08/16/23  Therapeutic Activity: Nustep L5 Ue/Le x 5 minutes  STS x 10 STS with 10# chest press Suit case carry 10# 50 feet x 4 alternating sides  Standing OH press 10# x 10 10# KB  lift from floor  Wooden dowel along spine for hip hinge training  90/90 abdominal hold 10 sec x 5  Updated HEP     [facility] Adult PT Treatment:                                                DATE: 08-11-23 Therapeutic Exercise: Nu step with UE/LE for 5 min workload  Reviewed all HEP for correct as below Supine Pelvic Tilt  SKTC LTR Supine Sciatic Nerve Glide   Supine Bridge  - 1 x daily  Hooklying Clamshell with GTB  Sidelying Open Book Thoracic Rotation with Knee on Foam Rol Supine March with Hands Behind Backl    Therapeutic Activity: Reviewed body mechanics for lifting and household chores with demo example of sweeping and lifting Demo of putting food in low oven as she has at home with VC and TC  Self Care: Posture sitting and standing Posture and body mechanics for household chores and back protection and relief of pressure with common household chores  Handout provided and went over all information.    [facility] Adult PT Treatment:                                                DATE: 08/09/23 Therapeutic Exercise: Bridge 10 x 2  Supine Marching with hands behind back  Supine blue band clam while maintaining PPT  Supine alternating bent knee fall out with BTB SLR with plates ring x 10 each   Therapeutic Activity: 6 MWT: 1527 feet    [facility] Adult PT Treatment:  DATE: 08-04-23 Therapeutic Exercise: Nu step UE/LE L5 48m while taking subjective  PPT 20x  SKTC - no need for modification today 5 x 10 second R and L LTR 3 x 20 sec each Sciatic nerve glide/active HS stretch Bridge with ball 2 x 10 3/4 range Hook lying GTB  Clamshell 2 x 10 Supine march with hands behind back to encourage PPT 2 x 10  Self Care: DOMS  explanation  Correct diaphragmatic breathing  coordinating with exercise [facility] Adult PT Treatment  08/02/2023:  Therapeutic Exercise:  nu-step L5 62m while taking subjective and planning session with patient Reviewing  HEP LTR SKTC - modified with pillowcase as stretch strap Sciatic nerve glide/active HS stretch Bridge - small range - 2x10 Education regarding appropriate pain levels and progressive overload S/L clam - GTB - 2x10 Alternating SLR from foam roller - 2x10    HOME EXERCISE PROGRAM: Access Code: [email or web address] URL: [email or web address] Date: 08/02/2023 Prepared by: Lesleigh Rash  Exercises - Supine Pelvic Tilt  - 1 x daily - 7 x weekly - 3 sets - 10 reps - Supine Single Knee to Chest Stretch  - 2 x daily - 7 x weekly - 1 sets - 5 reps - 10 hold - Supine Lower Trunk Rotation  - 2 x daily - 7 x weekly - 1 sets - 5 reps - 20 hold - Supine Sciatic Nerve Glide  - 1 x daily - 7 x weekly - 3 sets - 10 reps - Supine Bridge  - 1 x daily - 7 x weekly - 3 sets - 10 reps - 2-3 seconds hold - Hooklying Clamshell with Resistance  - 1 x daily - 7 x weekly - 3 sets - 10 reps Added 08-04-23 - Supine March with Hands Behind Back  - 1 x daily - 7 x weekly - 3 sets - 10 reps - Sidelying Open Book Thoracic Rotation with Knee on Foam Roll  - 1 x daily - 7 x weekly - 2 sets - 10 reps 08/16/23 - Supine 90/90 Abdominal Bracing  - 1 x daily - 7 x weekly - 1 sets - 5 reps - 10 hold - Kettlebell Deadlift  - 1 x daily - 7 x weekly - 1-2 sets - 10 reps added - Bent Over Single Arm Shoulder Row with Dumbbell  - 1 x daily - 7 x weekly - 3 sets - 10 reps - Standing Hip Hinge with Dowel  - 1 x daily - 7 x weekly - 3 sets - 10 reps - Goblet Squat with Kettlebell  - 1 x daily - 7 x weekly - 3 sets - 10 reps - Kettlebell Deadlift  - 1 x daily - 7 x weekly - 3 sets - 8 reps - Farmer's Carry with Kettlebells  - 1 x daily - 7 x weekly - 2 sets - 8 reps ASSESSMENT:  CLINICAL IMPRESSION: 08/16/23: Pt educated and reinforced body mechanics for deadlifting with dead lift ladder 15#, 25# and 45 # . Educated on [employer] and importance of progressive loading for continual strengthening. Pt plans to go to gym  with a friend who can help her increase load at gym. Problem solved  how to utilize household items for increasing weight. Pt is preparing  for  possible DC next visit if goals achieved Will need , prone plank and education on gym equipment. Continued with lifting and carrying with pt requiring mod cues for correct spine alignment. She did well with session and reported  no increase pain. Pt left with all questions answered.   08-04-23  Carrianne tolerated session well with no adverse reaction. Pt reports no pain but some pain doing exercises. Pt HEP reviewed and VC and TC for correct execution. Pt also educated and reviewed lifting and posture and body mechanics for household chores.HEP focusing on lumbar stabilization with cues on how to maintain PPT and continued breathing continued from last session.  No increased pain during session. Pt is hesitant to exercise on her own and fearful of reinjury. Pt encouraged to perform exercises that she knows and to ask for help at gym before trying any new equipment. Pt left with all questions answered from pt perspective.  EVAL: Patient is a 58 y.o. female who was seen today for physical therapy evaluation and treatment for left sided low back pain with left radiculopathy and thoracic back pain. Pt formerly seen with bil TKA and benefited from PT.  Pt is routinely active and had controlled her 4 years of back pain with exercise but in last 2 months pt is unable to alleviate pain with exercise alone.  Pt is motivated to exercise but has not been able to return to gym in 2 and 1/2 months. Pt will benefit from skilled PT to address impairments and ongoing intermittent pain  OBJECTIVE IMPAIRMENTS: decreased activity tolerance, decreased mobility, difficulty walking, decreased ROM, decreased strength, increased muscle spasms, improper body mechanics, postural dysfunction, obesity, and pain.   ACTIVITY LIMITATIONS: carrying, lifting, bending, sitting, standing,  squatting, reach over head, and locomotion level  PARTICIPATION LIMITATIONS: meal prep, cleaning, laundry, driving, and community activity  PERSONAL FACTORS: OA bil knees, GERD, hyperlipidemia, TKA bil for genu knee varus. Dec 2015 bil, appy, are also affecting patient's functional outcome.   REHAB POTENTIAL: Good  CLINICAL DECISION MAKING: Evolving/moderate complexity  EVALUATION COMPLEXITY: Moderate   GOALS: Goals reviewed with patient? Yes  SHORT TERM GOALS: Target date: 08-16-23  Pt will be I with initial HEP Baseline:no knowledge 08-11-23 I was better with my exercises when I started reading them.  The videos are in English Goal status: Ongoing  2.  Report pain decrease from 10/10 to 5/10 Baseline: 10/10 at worst 08-04-23  2/10 08-11-23  0/10 pain but only occasionally Goal status: ONGOING  3.  Demonstrate understanding of neutral posture and be more conscious of position and posture throughout the day.  Baseline: limited knowledge 08-11-23 Goal status: MET  4.  Pt will be able to increase lumbar rotation  and lumbar flexion in order to comfortable perform household chores Baseline: See [facility] chart 08/16/23: lumbar rotation improved  Goal status: Partially met     LONG TERM GOALS: Target date: 09-10-23  Pt will be independent with advanced HEP Baseline: no knowledge Goal status: ONGOING  2.  Pt will be able to perform planks for 30 seconds without increasing pai Baseline: 10 sec max Goal status: ONGOING  3.  Pt will use household cooking equipment  for making tamales about 15 #  Baseline: unable to lift any weight 08-23-23 able to lift 45# Goal status:MET  4.  Pt will improve ODI to at least 18% Baseline: eval 30% Goal status: ONGOING  5.  Demonstrate and verbalize techniques to reduce the risk of re-injury including: lifting, posture, body mechanics.  Baseline: no knowledge 08-23-23 Reviewed and demonstrated Goal status: MET  6.  Pt will be able to perform  within normal range for age [phone] ft) Baseline: NT at eval 08/09/23: 1527 feet  Goal  status: ONGOING  PLAN:  PT FREQUENCY: 1-2x/week  PT DURATION: 6 weeks  PLANNED INTERVENTIONS: 97164- PT Re-evaluation, 97110-Therapeutic exercises, 97530- Therapeutic activity, [id number]- Neuromuscular re-education, 97535- Self Care, [id number]- Manual therapy, [phone]- Gait training, [phone]- Electrical stimulation (manual), Patient/Family education, Balance training, Stair training, Taping, Dry Needling, Joint mobilization, Spinal mobilization, Cryotherapy, and Moist heat.  PLAN FOR NEXT SESSION: possible TPDN,  progress exercise and capacity to carry household equipment and return to gym for exercise    Sharlet Dawson, PT, [facility] Certified Exercise Expert for the Aging Adult  08/23/23 12:48 PM Phone: [phone] Fax: [phone]

## 2023-08-25 ENCOUNTER — Ambulatory Visit: Payer: Self-pay | Admitting: Physical Therapy

## 2023-08-25 ENCOUNTER — Encounter: Payer: Self-pay | Admitting: Physical Therapy

## 2023-08-25 DIAGNOSIS — M6281 Muscle weakness (generalized): Secondary | ICD-10-CM

## 2023-08-25 DIAGNOSIS — G8929 Other chronic pain: Secondary | ICD-10-CM

## 2023-08-25 DIAGNOSIS — M546 Pain in thoracic spine: Secondary | ICD-10-CM

## 2023-08-25 NOTE — Therapy (Signed)
 OUTPATIENT PHYSICAL THERAPY DAILY NOTE   Patient Name: Tina Johnson MRN: [id number] DOB:[birth year], 58 y.o., female Today's Date: 08/25/2023  END OF SESSION:  PT End of Session - 08/25/23 0842     Visit Number 9    Number of Visits 12    Date for PT Re-Evaluation 09/10/23    Authorization Type CAFA    PT Start Time 0845    PT Stop Time 0926    PT Time Calculation (min) 41 min    Activity Tolerance Patient limited by pain;Patient tolerated treatment well                 Past Medical History:  Diagnosis Date   Abnormality of gait 04/26/2014   Anxiety    controls with exercise    Arthritis    knees , shoulders   Bradycardia 05/31/2017   DEPRESSION 08/14/2007   Qualifier: Diagnosis of  By: Zara Heymann     Family history of diabetes mellitus (DM) 01/23/2014   Genu varum of both lower extremities 03/07/2014   GERD 08/14/2007   Qualifier: Diagnosis of  By: Zara Heymann     HEADACHE, CHRONIC 08/14/2007   Qualifier: Diagnosis of  By: Zara Heymann     HYPERLIPIDEMIA 08/14/2007   Qualifier: Diagnosis of  By: Milinda Allen, Michele     Indigestion    no medicine at present   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   Neoplasm of appendix    OSTEOARTHRITIS 08/14/2007   Qualifier: Diagnosis of  By: Zara Heymann     Other bilateral secondary osteoarthritis of knee 04/03/2015   PONV (postoperative nausea and vomiting) 1999   partial hysterectomy in [place]   Right knee DJD 01/23/2014   S/P TKR (total knee replacement), 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  Have gelport and Right hemicolectomy cart in the room.     LAPAROSCOPIC RIGHT HEMI COLECTOMY Right 09/26/2017   Procedure: LAPAROSCOPIC RIGHT HEMI COLECTOMY;  Surgeon: Alben Alma, MD;  Location: [facility] OR[facility];  Service: General;  Laterality: Right;   TOTAL ABDOMINAL HYSTERECTOMY W/ BILATERAL SALPINGOOPHORECTOMY  2001   done at [facility] hosp   TOTAL KNEE ARTHROPLASTY Right 04/03/2015   Procedure: RIGHT TOTAL KNEE ARTHROPLASTY;  Surgeon: Wes Hamman, MD;  Location: [facility] OR;  Service: Orthopedics;  Laterality: Right;   TOTAL KNEE ARTHROPLASTY Left 04/03/2015   Procedure: LEFT TOTAL KNEE ARTHROPLASTY;  Surgeon: Wes Hamman, MD;  Location: [facility] OR;  Service: Orthopedics;  Laterality: Left;   VAGINAL DELIVERY     ?x2   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   Bradycardia 05/31/2017   S/P TKR (total knee replacement), bilateral 10/07/2016   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   Genu varum of both lower extremities 03/07/2014   Family history of diabetes mellitus (DM) 01/23/2014   Right knee DJD 01/23/2014   Hyperlipidemia 01/23/2014   HYPERLIPIDEMIA 08/14/2007   DEPRESSION  08/14/2007   GERD 08/14/2007   Headache 08/14/2007    PCP: Lawrance Presume, MD   REFERRING PROVIDER: Jude Norton, MD   REFERRING DIAG: M54.50,G89.29 (ICD-10-CM) - Chronic low back pain, unspecified back pain laterality, unspecified whether sciatica present   Rationale for Evaluation and Treatment: Rehabilitation  THERAPY DIAG:  Chronic left-sided low back pain with left-sided sciatica  Pain in thoracic spine  Muscle weakness (generalized)  ONSET DATE: onset 4 years ago but more recently exacerbating pain in last 2 months  SUBJECTIVE:                                                                                                                                                                                            SUBJECTIVE STATEMENT:  Pt reports that overall she is doing well.  When she exercises she gets a little pain but this goes away quickly.   EVAL: Through interpreter, pt explains she has had pain for about 4 years but she did exercise and it helped her pain but in the last 2 months it has gotten worse and exercise don't help. After 2 hours of sitting pain begins.  Pain is worse when she is sitting  but walking is better for pain. Stand is OK but she is having more pain down her left side. She went the whole day mostly standing and last night when she was lying down and husband massaging her left lower leg the pain went away in her back. 4 years ago her back pain started with no incident.  More in low back and upper back  PERTINENT HISTORY:  OA bil knees, GERD, hyperlipidemia, TKA bil for genu knee varus. Dec 2015 bil, appy,   PAIN:  Are you having pain? Yes: NPRS scale: at rest 7/10 now and at worst 10/10  Pain location: thoracic  and low back Pain description: spasming achy pain Aggravating factors: lifting household items like appliances to make tamales  about 14-15 pounds, sometimes disrupts sleep Relieving factors: movement makes better  PRECAUTIONS: None  RED FLAGS: None   WEIGHT BEARING RESTRICTIONS: No  FALLS:  Has patient fallen in last 6 months? 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  DIAGNOSTIC FINDINGS:  2 view x-rays of the lumbar spine show lordosis of  the lumbar spine, there  is some noted retrolisthesis of L4-L5.  Some noted spurring throughout the  lumbar  spine and mild sclerotic changes.  Disc height does appear to be  narrower at L4-L5 and L1-L2.  Patient also noted to have some mild  thoracic disc height narrowing  See medical records for x rays  PATIENT SURVEYS:  Modified Oswestry 30%   COGNITION: Overall cognitive status: Within functional limits for tasks assessed     SENSATION: WFL  MUSCLE LENGTH: Hamstrings: slight tightness L > R   POSTURE: rounded shoulders, forward head, and pelvic level higher on left side  PALPATION: Pt with TTP over thoracic lumbar paraspinals  LUMBAR ROM:   [facility] eval 08/16/23  Flexion Fingertips to ankles * Finger tips to ankles, no back pain   Extension 50% decrease *   Right lateral flexion WFL   Left lateral flexion WFL   Right rotation 50% 75%  Left rotation 50% 75%   Key: WFL = within functional limits not formally assessed, * = concordant pain, s = stiffness/stretching sensation, NT = not tested)  LOWER EXTREMITY ROM:     Active  Right eval Left eval  Hip flexion 70standing 70 standing *  Hip extension    Hip abduction    Hip adduction    Hip internal rotation    Hip external rotation    Knee flexion    Knee extension    Ankle dorsiflexion    Ankle plantarflexion    Ankle inversion    Ankle eversion    (Blank rows = not tested, score listed is out of 5 possible points.  N = WNL, D = diminished, C = clear for gross weakness with myotome testing, * = concordant pain with testing)   LOWER EXTREMITY MMT:    MMT Right eval Left eval  Hip flexion 4+ 4+  Hip extension 4 4  Hip abduction 4 4-  Hip adduction    Hip internal rotation    Hip external rotation    Knee flexion 4+ 4+  Knee extension 4+ 4-  Ankle dorsiflexion    Ankle plantarflexion    Ankle inversion    Ankle eversion     (Blank rows = not tested)  LUMBAR SPECIAL TESTS:  Straight leg raise test: Negative, Slump test: Negative, and FABER test: Negative But pt with limited ER bil FUNCTIONAL TESTS:  5 times  sit to stand: 11.26 sec 2 minute walk test: TBD 08/09/23: 6 MWT :1527 feet no increased pain  GAIT: Distance walked: 150 to clinic Assistive device utilized: None Level of assistance: Complete Independence Comments: Pt with  slight antalgic gait disturbance  [facility] Adult PT Treatment:                                                DATE: 08-25-23   Therapeutic Activity: Bike - 5 min - L2 Demonstration of common gym machines including knee flexion, knee ext, chest press, low and high row, hack squat.  Pt provided advise on form, ideal sets and reps, ideal weight, how to progress.   [facility] Adult PT Treatment:  DATE: 08-18-23 Therapeutic Exercise: Thoracic book opening side-lying on left and right 10 x 90/90 abdominal hold 10 sec x 5  LTR  Therapeutic Activity: Nustep L5 Ue/Le x 5 minutes  Hip hinge STS with   15 # KB x 10 Deadlift with 15 #  5 x 2 followed by standing extension stretch Deadlift  with 25 #  5 x 2  followed by standing extension stretch Suit case carry 15 # 50 feet x 4 alternating sides   Self Care: Self spinal mobs using double tennis ball over thoracic spine and Left shoulder blade between spine and scapula Return demo by PT   Lifting principles reinforced  [facility] Adult PT Treatment:                                                DATE: 08/16/23  Therapeutic Activity: Nustep L5 Ue/Le x 5 minutes  STS x 10 STS with 10# chest press Suit case carry 10# 50 feet x 4 alternating sides  Standing OH press 10# x 10 10# KB lift from floor  Wooden dowel along spine for hip hinge training  90/90 abdominal hold 10 sec x 5  Updated HEP     [facility] Adult PT Treatment:                                                DATE: 08-11-23 Therapeutic Exercise: Nu step with UE/LE for 5 min workload  Reviewed all HEP for correct as below Supine Pelvic Tilt  SKTC LTR Supine Sciatic Nerve Glide   Supine Bridge  - 1 x daily  Hooklying  Clamshell with GTB  Sidelying Open Book Thoracic Rotation with Knee on Foam Rol Supine March with Hands Behind Backl    Therapeutic Activity: Reviewed body mechanics for lifting and household chores with demo example of sweeping and lifting Demo of putting food in low oven as she has at home with VC and TC  Self Care: Posture sitting and standing Posture and body mechanics for household chores and back protection and relief of pressure with common household chores  Handout provided and went over all information.    [facility] Adult PT Treatment:                                                DATE: 08/09/23 Therapeutic Exercise: Bridge 10 x 2  Supine Marching with hands behind back  Supine blue band clam while maintaining PPT  Supine alternating bent knee fall out with BTB SLR with plates ring x 10 each   Therapeutic Activity: 6 MWT: 1527 feet    [facility] Adult PT Treatment:                                                DATE: 08-04-23 Therapeutic Exercise: Nu step UE/LE L5 80m while taking subjective  PPT 20x  SKTC - no need for modification today 5 x 10 second R and L LTR  3 x 20 sec each Sciatic nerve glide/active HS stretch Bridge with ball 2 x 10 3/4 range Hook lying GTB  Clamshell 2 x 10 Supine march with hands behind back to encourage PPT 2 x 10  Self Care: DOMS  explanation  Correct diaphragmatic breathing  coordinating with exercise [facility] Adult PT Treatment  08/02/2023:  Therapeutic Exercise:  nu-step L5 65m while taking subjective and planning session with patient Reviewing HEP LTR SKTC - modified with pillowcase as stretch strap Sciatic nerve glide/active HS stretch Bridge - small range - 2x10 Education regarding appropriate pain levels and progressive overload S/L clam - GTB - 2x10 Alternating SLR from foam roller - 2x10    HOME EXERCISE PROGRAM: Access Code: [email or web address] URL: [email or web address] Date: 08/02/2023 Prepared by: Lesleigh Rash  Exercises - Supine Pelvic Tilt  - 1 x daily - 7 x weekly - 3 sets - 10 reps - Supine Single Knee to Chest Stretch  - 2 x daily - 7 x weekly - 1 sets - 5 reps - 10 hold - Supine Lower Trunk Rotation  - 2 x daily - 7 x weekly - 1 sets - 5 reps - 20 hold - Supine Sciatic Nerve Glide  - 1 x daily - 7 x weekly - 3 sets - 10 reps - Supine Bridge  - 1 x daily - 7 x weekly - 3 sets - 10 reps - 2-3 seconds hold - Hooklying Clamshell with Resistance  - 1 x daily - 7 x weekly - 3 sets - 10 reps Added 08-04-23 - Supine March with Hands Behind Back  - 1 x daily - 7 x weekly - 3 sets - 10 reps - Sidelying Open Book Thoracic Rotation with Knee on Foam Roll  - 1 x daily - 7 x weekly - 2 sets - 10 reps 08/16/23 - Supine 90/90 Abdominal Bracing  - 1 x daily - 7 x weekly - 1 sets - 5 reps - 10 hold - Kettlebell Deadlift  - 1 x daily - 7 x weekly - 1-2 sets - 10 reps added - Bent Over Single Arm Shoulder Row with Dumbbell  - 1 x daily - 7 x weekly - 3 sets - 10 reps - Standing Hip Hinge with Dowel  - 1 x daily - 7 x weekly - 3 sets - 10 reps - Goblet Squat with Kettlebell  - 1 x daily - 7 x weekly - 3 sets - 10 reps - Kettlebell Deadlift  - 1 x daily - 7 x weekly - 3 sets - 8 reps - Farmer's Carry with Kettlebells  - 1 x daily - 7 x weekly - 2 sets - 8 reps ASSESSMENT:  CLINICAL IMPRESSION: 08/16/23: Crystalee tolerated session well with no adverse reaction.  We concentrated on preparing Tina Johnson for return to gym with demonstration of appropriate common gym equipment and lengthy discussion on how to choose weight and progress.  Scheduled 2 additional follow up apts to help pt trouble shoot any issues that arise after return to gym and reinforce proper intensity and frequency suggested today (5-10 reps - 3 sets, 2x/week per muscle group, increase weight if more that 15 reps can be completed.)   08-04-23  Rocio tolerated session well with no adverse reaction. Pt reports no pain but some pain  doing exercises. Pt HEP reviewed and VC and TC for correct execution. Pt also educated and reviewed lifting and posture and body mechanics for household chores.HEP focusing on lumbar stabilization with cues  on how to maintain PPT and continued breathing continued from last session.  No increased pain during session. Pt is hesitant to exercise on her own and fearful of reinjury. Pt encouraged to perform exercises that she knows and to ask for help at gym before trying any new equipment. Pt left with all questions answered from pt perspective.  EVAL: Patient is a 58 y.o. female who was seen today for physical therapy evaluation and treatment for left sided low back pain with left radiculopathy and thoracic back pain. Pt formerly seen with bil TKA and benefited from PT.  Pt is routinely active and had controlled her 4 years of back pain with exercise but in last 2 months pt is unable to alleviate pain with exercise alone.  Pt is motivated to exercise but has not been able to return to gym in 2 and 1/2 months. Pt will benefit from skilled PT to address impairments and ongoing intermittent pain  OBJECTIVE IMPAIRMENTS: decreased activity tolerance, decreased mobility, difficulty walking, decreased ROM, decreased strength, increased muscle spasms, improper body mechanics, postural dysfunction, obesity, and pain.   ACTIVITY LIMITATIONS: carrying, lifting, bending, sitting, standing, squatting, reach over head, and locomotion level  PARTICIPATION LIMITATIONS: meal prep, cleaning, laundry, driving, and community activity  PERSONAL FACTORS: OA bil knees, GERD, hyperlipidemia, TKA bil for genu knee varus. Dec 2015 bil, appy, are also affecting patient's functional outcome.   REHAB POTENTIAL: Good  CLINICAL DECISION MAKING: Evolving/moderate complexity  EVALUATION COMPLEXITY: Moderate   GOALS: Goals reviewed with patient? Yes  SHORT TERM GOALS: Target date: 08-16-23  Pt will be I with initial  HEP Baseline:no knowledge 08-11-23 I was better with my exercises when I started reading them.  The videos are in English Goal status: Ongoing  2.  Report pain decrease from 10/10 to 5/10 Baseline: 10/10 at worst 08-04-23  2/10 08-11-23  0/10 pain but only occasionally Goal status: ONGOING  3.  Demonstrate understanding of neutral posture and be more conscious of position and posture throughout the day.  Baseline: limited knowledge 08-11-23 Goal status: MET  4.  Pt will be able to increase lumbar rotation  and lumbar flexion in order to comfortable perform household chores Baseline: See [facility] chart 08/16/23: lumbar rotation improved  Goal status: Partially met     LONG TERM GOALS: Target date: 09-10-23  Pt will be independent with advanced HEP Baseline: no knowledge Goal status: ONGOING  2.  Pt will be able to perform planks for 30 seconds without increasing pai Baseline: 10 sec max Goal status: ONGOING  3.  Pt will use household cooking equipment  for making tamales about 15 #  Baseline: unable to lift any weight 08-23-23 able to lift 45# Goal status:MET  4.  Pt will improve ODI to at least 18% Baseline: eval 30% Goal status: ONGOING  5.  Demonstrate and verbalize techniques to reduce the risk of re-injury including: lifting, posture, body mechanics.  Baseline: no knowledge 08-23-23 Reviewed and demonstrated Goal status: MET  6.  Pt will be able to perform within normal range for age [phone] ft) Baseline: NT at eval 08/09/23: 1527 feet  Goal status: ONGOING  PLAN:  PT FREQUENCY: 1-2x/week  PT DURATION: 6 weeks  PLANNED INTERVENTIONS: 97164- PT Re-evaluation, 97110-Therapeutic exercises, 97530- Therapeutic activity, 97112- Neuromuscular re-education, 97535- Self Care, 01027- Manual therapy, [phone]- Gait training, [phone]- Electrical stimulation (manual), Patient/Family education, Balance training, Stair training, Taping, Dry Needling, Joint mobilization, Spinal  mobilization, Cryotherapy, and Moist heat.  PLAN FOR  NEXT SESSION: possible TPDN,  progress exercise and capacity to carry household equipment and return to gym for exercise    Marquis Sitter PT 08/25/23 10:16 AM Phone: [phone] Fax: [phone]

## 2023-08-29 ENCOUNTER — Ambulatory Visit: Payer: Self-pay | Admitting: Physical Therapy

## 2023-08-29 DIAGNOSIS — M546 Pain in thoracic spine: Secondary | ICD-10-CM

## 2023-08-29 DIAGNOSIS — M6281 Muscle weakness (generalized): Secondary | ICD-10-CM

## 2023-08-29 DIAGNOSIS — G8929 Other chronic pain: Secondary | ICD-10-CM

## 2023-08-29 NOTE — Therapy (Signed)
 OUTPATIENT PHYSICAL THERAPY DAILY NOTE   Patient Name: Tina Johnson MRN: [id number] DOB:07/22/1965, 58 y.o., female Today's Date: 08/29/2023  END OF SESSION:  PT End of Session - 08/29/23 1106     Visit Number 10    Number of Visits 12    Date for PT Re-Evaluation 09/10/23    Authorization Type CAFA    PT Start Time 1104    PT Stop Time 1158    PT Time Calculation (min) 54 min    Activity Tolerance Patient tolerated treatment well    Behavior During Therapy [facility] for tasks assessed/performed                  Past Medical History:  Diagnosis Date   Abnormality of gait 04/26/2014   Anxiety    controls with exercise    Arthritis    knees , shoulders   Bradycardia 05/31/2017   DEPRESSION 08/14/2007   Qualifier: Diagnosis of  By: Zara Heymann     Family history of diabetes mellitus (DM) 01/23/2014   Genu varum of both lower extremities 03/07/2014   GERD 08/14/2007   Qualifier: Diagnosis of  By: Zara Heymann     HEADACHE, CHRONIC 08/14/2007   Qualifier: Diagnosis of  By: Zara Heymann     HYPERLIPIDEMIA 08/14/2007   Qualifier: Diagnosis of  By: Zara Heymann     Indigestion    no medicine at present   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   Neoplasm of appendix    OSTEOARTHRITIS 08/14/2007   Qualifier: Diagnosis of  By: Zara Heymann     Other bilateral secondary osteoarthritis of knee 04/03/2015   PONV (postoperative nausea and vomiting) 1999   partial hysterectomy in [place]   Right knee DJD 01/23/2014   S/P TKR (total knee replacement), 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  Have gelport and Right  hemicolectomy cart in the room.    LAPAROSCOPIC RIGHT HEMI COLECTOMY Right 09/26/2017   Procedure: LAPAROSCOPIC RIGHT HEMI COLECTOMY;  Surgeon: Alben Alma, MD;  Location: [facility] ORS;  Service: General;  Laterality: Right;   TOTAL ABDOMINAL HYSTERECTOMY W/ BILATERAL SALPINGOOPHORECTOMY  2001   done at [facility] hosp   TOTAL KNEE ARTHROPLASTY Right 04/03/2015   Procedure: RIGHT TOTAL KNEE ARTHROPLASTY;  Surgeon: Wes Hamman, MD;  Location: [facility] OR;  Service: Orthopedics;  Laterality: Right;   TOTAL KNEE ARTHROPLASTY Left 04/03/2015   Procedure: LEFT TOTAL KNEE ARTHROPLASTY;  Surgeon: Wes Hamman, MD;  Location: [facility] OR;  Service: Orthopedics;  Laterality: Left;   VAGINAL DELIVERY     ?x2   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   Bradycardia 05/31/2017   S/P TKR (total knee replacement), bilateral 10/07/2016   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   Genu varum of both lower extremities 03/07/2014   Family history of diabetes mellitus (DM) 01/23/2014   Right knee DJD 01/23/2014   Hyperlipidemia  01/23/2014   HYPERLIPIDEMIA 08/14/2007   DEPRESSION 08/14/2007   GERD 08/14/2007   Headache 08/14/2007    PCP: Lawrance Presume, MD   REFERRING PROVIDER: Jude Norton, MD   REFERRING DIAG: M54.50,G89.29 (ICD-10-CM) - Chronic low back pain, unspecified back pain laterality, unspecified whether sciatica present   Rationale for Evaluation and Treatment: Rehabilitation  THERAPY DIAG:  Chronic left-sided low back pain with left-sided sciatica  Pain in thoracic spine  Muscle weakness (generalized)  ONSET DATE: onset 4 years ago but more recently exacerbating pain in last 2 months  SUBJECTIVE:                                                                                                                                                                                            SUBJECTIVE STATEMENT:  Pt tried to do some of the lifting yesterday but she couldn't maintain straight back like she should. Min pain today 2/10.    EVAL: Through interpreter, pt explains she has had pain for about 4 years but she did exercise and it helped her pain but in the last 2 months it has gotten worse and exercise don't help. After 2 hours of sitting pain begins.  Pain is worse when she is sitting  but walking is better for pain. Stand is OK but she is having more pain down her left side. She went the whole day mostly standing and last night when she was lying down and husband massaging her left lower leg the pain went away in her back. 4 years ago her back pain started with no incident.  More in low back and upper back  PERTINENT HISTORY:  OA bil knees, GERD, hyperlipidemia, TKA bil for genu knee varus. Dec 2015 bil, appy,   PAIN:  Are you having pain? Yes: NPRS scale: at rest 7/10 now and at worst 10/10  Pain location: thoracic  and low back Pain description: spasming achy pain Aggravating factors: lifting household items like appliances to make tamales  about 14-15 pounds, sometimes disrupts sleep Relieving factors: movement makes better  PRECAUTIONS: None  RED FLAGS: None   WEIGHT BEARING RESTRICTIONS: No  FALLS:  Has patient fallen in last 6 months? 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   PLOF: Independent  PATIENT GOALS: Feel better and be able to return to gym  NEXT MD VISIT: TBD  OBJECTIVE:  Note: Objective measures were completed at Evaluation unless otherwise noted.  DIAGNOSTIC FINDINGS:  2  view x-rays of the lumbar spine show lordosis of the lumbar spine, there  is some noted retrolisthesis of  L4-L5.  Some noted spurring throughout the  lumbar spine and mild sclerotic changes.  Disc height does appear to be  narrower at L4-L5 and L1-L2.  Patient also noted to have some mild  thoracic disc height narrowing  See medical records for x rays  PATIENT SURVEYS:  Modified Oswestry 30%   COGNITION: Overall cognitive status: Within functional limits for tasks assessed     SENSATION: WFL  MUSCLE LENGTH: Hamstrings: slight tightness L > R   POSTURE: rounded shoulders, forward head, and pelvic level higher on left side  PALPATION: Pt with TTP over thoracic lumbar paraspinals  LUMBAR ROM:   [facility] eval 08/16/23  Flexion Fingertips to ankles * Finger tips to ankles, no back pain   Extension 50% decrease *   Right lateral flexion WFL   Left lateral flexion WFL   Right rotation 50% 75%  Left rotation 50% 75%   Key: WFL = within functional limits not formally assessed, * = concordant pain, s = stiffness/stretching sensation, NT = not tested)  LOWER EXTREMITY ROM:     Active  Right eval Left eval  Hip flexion 70standing 70 standing *  Hip extension    Hip abduction    Hip adduction    Hip internal rotation    Hip external rotation    Knee flexion    Knee extension    Ankle dorsiflexion    Ankle plantarflexion    Ankle inversion    Ankle eversion    (Blank rows = not tested, score listed is out of 5 possible points.  N = WNL, D = diminished, C = clear for gross weakness with myotome testing, * = concordant pain with testing)   LOWER EXTREMITY MMT:    MMT Right eval Left eval  Hip flexion 4+ 4+  Hip extension 4 4  Hip abduction 4 4-  Hip adduction    Hip internal rotation    Hip external rotation    Knee flexion 4+ 4+  Knee extension 4+ 4-  Ankle dorsiflexion    Ankle plantarflexion    Ankle inversion    Ankle eversion     (Blank rows = not tested)  LUMBAR SPECIAL TESTS:  Straight leg raise test: Negative, Slump test: Negative, and FABER test: Negative But  pt with limited ER bil FUNCTIONAL TESTS:  5 times sit to stand: 11.26 sec 2 minute walk test: TBD 08/09/23: 6 MWT :1527 feet no increased pain  GAIT: Distance walked: 150 to clinic Assistive device utilized: None Level of assistance: Complete Independence Comments: Pt with slight antalgic gait disturbance   [facility] Adult PT Treatment:                                                DATE: 08/29/23 Therapeutic Exercise: Recumbent bike L 1 for 5 min 2 pillows  Therapeutic Activity: Technique for lifting/pressing with leg press 1 plate x 10 x 2  Wall squat with ball x 10  Added an isometric hold  Bridging with ball  Lower ab with dumbbell 5# 90/90 hold  Knee ext Lat pull over  Dead bug- progression and regression Self Care: The role for and presenting back pain With modified exercises in a neutral core position Adding load with  dumbbell maintaining neutral position Time commitment for home program (was doing 1.5 hours)    08/25/23: Therapeutic Activity: Bike - 5 min - L2 Demonstration of common gym machines including knee flexion, knee ext, chest press, low and high row, hack squat.  Pt provided advise on form, ideal sets and reps, ideal weight, how to progress.   [facility] Adult PT Treatment:                                                DATE: 08-18-23 Therapeutic Exercise: Thoracic book opening side-lying on left and right 10 x 90/90 abdominal hold 10 sec x 5  LTR  Therapeutic Activity: Nustep L5 Ue/Le x 5 minutes  Hip hinge STS with   15 # KB x 10 Deadlift with 15 #  5 x 2 followed by standing extension stretch Deadlift  with 25 #  5 x 2  followed by standing extension stretch Suit case carry 15 # 50 feet x 4 alternating sides   Self Care: Self spinal mobs using double tennis ball over thoracic spine and Left shoulder blade between spine and scapula Return demo by PT   Lifting principles reinforced  [facility] Adult PT Treatment:                                                DATE:  08/16/23  Therapeutic Activity: Nustep L5 Ue/Le x 5 minutes  STS x 10 STS with 10# chest press Suit case carry 10# 50 feet x 4 alternating sides  Standing OH press 10# x 10 10# KB lift from floor  Wooden dowel along spine for hip hinge training  90/90 abdominal hold 10 sec x 5  Updated HEP     [facility] Adult PT Treatment:                                                DATE: 08-11-23 Therapeutic Exercise: Nu step with UE/LE for 5 min workload  Reviewed all HEP for correct as below Supine Pelvic Tilt  SKTC LTR Supine Sciatic Nerve Glide   Supine Bridge  - 1 x daily  Hooklying Clamshell with GTB  Sidelying Open Book Thoracic Rotation with Knee on Foam Rol Supine March with Hands Behind Backl    Therapeutic Activity: Reviewed body mechanics for lifting and household chores with demo example of sweeping and lifting Demo of putting food in low oven as she has at home with VC and TC  Self Care: Posture sitting and standing Posture and body mechanics for household chores and back protection and relief of pressure with common household chores  Handout provided and went over all information.    [facility] Adult PT Treatment:                                                DATE: 08/09/23 Therapeutic Exercise: Bridge 10 x 2  Supine Marching with hands behind back  Supine blue band clam while  maintaining PPT  Supine alternating bent knee fall out with BTB SLR with plates ring x 10 each   Therapeutic Activity: 6 MWT: 1527 feet    [facility] Adult PT Treatment:                                                DATE: 08-04-23 Therapeutic Exercise: Nu step UE/LE L5 5m while taking subjective  PPT 20x  SKTC - no need for modification today 5 x 10 second R and L LTR 3 x 20 sec each Sciatic nerve glide/active HS stretch Bridge with ball 2 x 10 3/4 range Hook lying GTB  Clamshell 2 x 10 Supine march with hands behind back to encourage PPT 2 x 10  Self Care: DOMS  explanation  Correct diaphragmatic  breathing  coordinating with exercise [facility] Adult PT Treatment  08/02/2023:  Therapeutic Exercise:  nu-step L5 69m while taking subjective and planning session with patient Reviewing HEP LTR SKTC - modified with pillowcase as stretch strap Sciatic nerve glide/active HS stretch Bridge - small range - 2x10 Education regarding appropriate pain levels and progressive overload S/L clam - GTB - 2x10 Alternating SLR from foam roller - 2x10    HOME EXERCISE PROGRAM: Access Code: [email or web address] URL: [email or web address] Date: 08/02/2023 Prepared by: Lesleigh Rash  Exercises - Supine Pelvic Tilt  - 1 x daily - 7 x weekly - 3 sets - 10 reps - Supine Single Knee to Chest Stretch  - 2 x daily - 7 x weekly - 1 sets - 5 reps - 10 hold - Supine Lower Trunk Rotation  - 2 x daily - 7 x weekly - 1 sets - 5 reps - 20 hold - Supine Sciatic Nerve Glide  - 1 x daily - 7 x weekly - 3 sets - 10 reps - Supine Bridge  - 1 x daily - 7 x weekly - 3 sets - 10 reps - 2-3 seconds hold - Hooklying Clamshell with Resistance  - 1 x daily - 7 x weekly - 3 sets - 10 reps Added 08-04-23 - Supine March with Hands Behind Back  - 1 x daily - 7 x weekly - 3 sets - 10 reps - Sidelying Open Book Thoracic Rotation with Knee on Foam Roll  - 1 x daily - 7 x weekly - 2 sets - 10 reps 08/16/23 - Supine 90/90 Abdominal Bracing  - 1 x daily - 7 x weekly - 1 sets - 5 reps - 10 hold - Kettlebell Deadlift  - 1 x daily - 7 x weekly - 1-2 sets - 10 reps added - Bent Over Single Arm Shoulder Row with Dumbbell  - 1 x daily - 7 x weekly - 3 sets - 10 reps - Standing Hip Hinge with Dowel  - 1 x daily - 7 x weekly - 3 sets - 10 reps - Goblet Squat with Kettlebell  - 1 x daily - 7 x weekly - 3 sets - 10 reps - Kettlebell Deadlift  - 1 x daily - 7 x weekly - 3 sets - 8 reps - Farmer's Carry with Kettlebells  - 1 x daily - 7 x weekly - 2 sets - 8 reps   ASSESSMENT:  CLINICAL IMPRESSION: 08/29/23: Session focused on establishing  a structured routine and which exercises to do and when in order to given her an  efficient and effective workout. She was able to work on core with cues for alignment in a neutral position.  No increased pain during 90/90 core stability. She was able to hold a low plank for 30 sec without increased pain.  Patient required increased time and motor control cues to integrate in alternating dead bug pattern. Cont POC.   EVAL: Patient is a 58 y.o. female who was seen today for physical therapy evaluation and treatment for left sided low back pain with left radiculopathy and thoracic back pain. Pt formerly seen with bil TKA and benefited from PT.  Pt is routinely active and had controlled her 4 years of back pain with exercise but in last 2 months pt is unable to alleviate pain with exercise alone.  Pt is motivated to exercise but has not been able to return to gym in 2 and 1/2 months. Pt will benefit from skilled PT to address impairments and ongoing intermittent pain  OBJECTIVE IMPAIRMENTS: decreased activity tolerance, decreased mobility, difficulty walking, decreased ROM, decreased strength, increased muscle spasms, improper body mechanics, postural dysfunction, obesity, and pain.   ACTIVITY LIMITATIONS: carrying, lifting, bending, sitting, standing, squatting, reach over head, and locomotion level  PARTICIPATION LIMITATIONS: meal prep, cleaning, laundry, driving, and community activity  PERSONAL FACTORS: OA bil knees, GERD, hyperlipidemia, TKA bil for genu knee varus. Dec 2015 bil, appy, are also affecting patient's functional outcome.   REHAB POTENTIAL: Good  CLINICAL DECISION MAKING: Evolving/moderate complexity  EVALUATION COMPLEXITY: Moderate   GOALS: Goals reviewed with patient? Yes  SHORT TERM GOALS: Target date: 08-16-23  Pt will be I with initial HEP Baseline:no knowledge 08-11-23 I was better with my exercises when I started reading them.  The videos are in English Goal status:  Ongoing  2.  Report pain decrease from 10/10 to 5/10 Baseline: 10/10 at worst 08-04-23  2/10 08-11-23  0/10 pain but only occasionally Goal status: ONGOING  3.  Demonstrate understanding of neutral posture and be more conscious of position and posture throughout the day.  Baseline: limited knowledge 08-11-23 Goal status: MET  4.  Pt will be able to increase lumbar rotation and lumbar flexion in order to comfortable perform household chores Baseline: See [facility] chart 08/16/23: lumbar rotation improved  Goal status: Partially met     LONG TERM GOALS: Target date: 09-10-23  Pt will be independent with advanced HEP Baseline: no knowledge Goal status: ONGOING  2.  Pt will be able to perform planks for 30 seconds without increasing pain Baseline: 10 sec max Goal status: ONGOING  3.  Pt will use household cooking equipment  for making tamales about 15 #  Baseline: unable to lift any weight 08-23-23 able to lift 45# Goal status:MET  4.  Pt will improve ODI to at least 18% Baseline: eval 30% Goal status: ONGOING  5.  Demonstrate and verbalize techniques to reduce the risk of re-injury including: lifting, posture, body mechanics.  Baseline: no knowledge 08-23-23 Reviewed and demonstrated Goal status: MET  6.  Pt will be able to perform within normal range for age [phone] ft) Baseline: NT at eval 08/09/23: 1527 feet  Goal status: ONGOING  PLAN:  PT FREQUENCY: 1-2x/week  PT DURATION: 6 weeks  PLANNED INTERVENTIONS: 97164- PT Re-evaluation, 97110-Therapeutic exercises, 97530- Therapeutic activity, 97112- Neuromuscular re-education, 97535- Self Care, 54098- Manual therapy, [phone]- Gait training, [phone]- Electrical stimulation (manual), Patient/Family education, Balance training, Stair training, Taping, Dry Needling, Joint mobilization, Spinal mobilization, Cryotherapy, and Moist heat.  PLAN FOR NEXT  SESSION: cont core and development of HEP, gym ex.  , capacity to carry household  equipment and return to gym for exercise    Johnson Nanny PT 08/29/23 12:05 PM Phone: [phone] Fax: [phone]

## 2023-08-30 ENCOUNTER — Ambulatory Visit: Payer: Self-pay | Attending: Internal Medicine | Admitting: Internal Medicine

## 2023-08-30 ENCOUNTER — Other Ambulatory Visit: Payer: Self-pay

## 2023-08-30 ENCOUNTER — Encounter: Payer: Self-pay | Admitting: Internal Medicine

## 2023-08-30 VITALS — BP 129/81 | HR 57 | Resp 19 | Ht <= 58 in | Wt 126.2 lb

## 2023-08-30 DIAGNOSIS — E669 Obesity, unspecified: Secondary | ICD-10-CM

## 2023-08-30 DIAGNOSIS — M546 Pain in thoracic spine: Secondary | ICD-10-CM

## 2023-08-30 DIAGNOSIS — Z0100 Encounter for examination of eyes and vision without abnormal findings: Secondary | ICD-10-CM

## 2023-08-30 DIAGNOSIS — I10 Essential (primary) hypertension: Secondary | ICD-10-CM

## 2023-08-30 DIAGNOSIS — E66811 Obesity, class 1: Secondary | ICD-10-CM

## 2023-08-30 DIAGNOSIS — Z6831 Body mass index (BMI) 31.0-31.9, adult: Secondary | ICD-10-CM

## 2023-08-30 DIAGNOSIS — Z23 Encounter for immunization: Secondary | ICD-10-CM

## 2023-08-30 MED ORDER — ZOSTER VAC RECOMB ADJUVANTED 50 MCG/0.5ML IM SUSR: 0.5000 mL | Freq: Once | INTRAMUSCULAR | 0 refills | Status: AC

## 2023-08-30 MED ORDER — ZOSTER VAC RECOMB ADJUVANTED 50 MCG/0.5ML IM SUSR
0.5000 mL | Freq: Once | INTRAMUSCULAR | 0 refills | Status: AC
  Filled 2023-08-30: qty 1, 1d supply, fill #0

## 2023-08-30 NOTE — Progress Notes (Signed)
 Patient ID: Tina Johnson, female    DOB: 1965-05-03  MRN: [id number]  CC: Back Pain   Subjective: Tina Johnson is a 57 y.o. female who presents for chronic ds management. 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    [employer] Language interpreter used during this encounter. # Genia Kettering [id number]  Obesity/HL: Last seen 01/2023 Eating healthy to try to decrease cholesterol level Reports she stopped cardio exercising because she was having some problems with her back.  Saw ortho Dr. Christiane Cowing for the back pain 07/06/2023 and x-ray showed:   2 view x-rays of the lumbar spine show lordosis of the lumbar spine, there  is some noted retrolisthesis of L4-L5.  Some noted spurring throughout the  lumbar spine and mild sclerotic changes.  Disc height does appear to be  narrower at L4-L5 and L1-L2.  Patient also noted to have some mild  thoracic disc height narrowing   He referred her for P.T. Pain is across the lower back ; this is better.  Pain also across mid portion of back. P.T  Wgh down 8 lbs since last visit in October 2024 despite her not being able to exercise as much as she can would like.  Reports her eating habits have been good..   Has dentist appt coming up but waiting to receive her OC.  Plans to stop and speak with Constantino Demark today on her way out.  Request referral for routine eye exam.  HTN: BP good on last visit so we did not restart Norvasc . Not checking BP. Reports has been using more salt in foods recently  HM:  referred for MMG on last visit; she reports she had it done in December through her GYN.  Will bring copy of her results letter. Due for 2nd shingles vaccine.    Patient Active Problem List   Diagnosis Date Noted   Essential hypertension 02/25/2020   Overweight 02/25/2020   S/P right colectomy 09/26/2017   Low grade mucinous neoplasm of  appendix    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   Bradycardia 05/31/2017   S/P TKR (total knee replacement), bilateral 10/07/2016   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   Genu varum of both lower extremities 03/07/2014   Family history of diabetes mellitus (DM) 01/23/2014   Right knee DJD 01/23/2014   Hyperlipidemia 01/23/2014   HYPERLIPIDEMIA 08/14/2007   DEPRESSION 08/14/2007   GERD 08/14/2007   Headache 08/14/2007     Current Outpatient Medications on File Prior to Visit  Medication Sig Dispense Refill   CALCIUM PO Take 1 tablet by mouth 2 (two) times daily.      Cholecalciferol (VITAMIN D3) 2000 units TABS Take 2,000 Units by mouth daily.     Multiple Vitamin (MULTIVITAMIN WITH MINERALS) TABS tablet Take 1 tablet by mouth 2 (two) times daily.     OMEGA-3 FATTY ACIDS PO Take 1 capsule by mouth 2 (two) times daily.      tiZANidine  (ZANAFLEX ) 4 MG tablet Take 1 tablet (4 mg total) by mouth every 6 (six) hours as needed for muscle spasms. 30 tablet 0   estradiol  (ESTRACE ) 0.1 MG/GM vaginal cream Place 1 g vaginally 2 (two) times a week. (Patient not taking: Reported on 08/30/2023) 42.5 g 3   pravastatin  (PRAVACHOL ) 20 MG tablet Take 0.5 tablets (10 mg total) by mouth daily. (Patient not taking: Reported on 08/30/2023) 90 tablet 3  No current facility-administered medications on file prior to visit.    Allergies  Allergen Reactions   Norco [Hydrocodone -Acetaminophen ] Shortness Of Breath   Latex Rash    Social History   Socioeconomic History   Marital status: Married    Spouse name: Not on file   Number of children: Not on file   Years of education: Not on file   Highest education level: Not on file  Occupational History   Not on file  Tobacco Use   Smoking status: Never   Smokeless tobacco: Never  Vaping Use   Vaping status: Never Used  Substance and Sexual Activity   Alcohol use: No   Drug use: No   Sexual activity: Not Currently  Other Topics Concern    Not on file  Social History Narrative   Not on file   Social Drivers of Health   Financial Resource Strain: High Risk (01/31/2023)   Overall Financial Resource Strain (CARDIA)    Difficulty of Paying Living Expenses: Hard  Food Insecurity: No Food Insecurity (01/31/2023)   Hunger Vital Sign    Worried About Running Out of Food in the Last Year: Never true    Ran Out of Food in the Last Year: Never true  Transportation Needs: No Transportation Needs (01/31/2023)   PRAPARE - Administrator, Civil Service (Medical): No    Lack of Transportation (Non-Medical): No  Physical Activity: Inactive (01/31/2023)   Exercise Vital Sign    Days of Exercise per Week: 0 days    Minutes of Exercise per Session: 0 min  Stress: Stress Concern Present (01/31/2023)   [employer] of Occupational Health - Occupational Stress Questionnaire    Feeling of Stress : Rather much  Social Connections: Socially Integrated (01/31/2023)   Social Connection and Isolation Panel [NHANES]    Frequency of Communication with Friends and Family: Twice a week    Frequency of Social Gatherings with Friends and Family: Once a week    Attends Religious Services: 1 to 4 times per year    Active Member of [employer] or Organizations: Yes    Attends Engineer, structural: More than 4 times per year    Marital Status: Married  Catering manager Violence: Not At Risk (01/31/2023)   Humiliation, Afraid, Rape, and Kick questionnaire    Fear of Current or Ex-Partner: No    Emotionally Abused: No    Physically Abused: No    Sexually Abused: No    Family History  Problem Relation Age of Onset   Diabetes Sister    Diabetes Brother    Diabetes Mother    Diabetes Sister    Diabetes Sister    Diabetes Brother    Diabetes Brother    Colon cancer Neg Hx    Cancer Neg Hx    Rectal cancer Neg Hx     Past Surgical History:  Procedure Laterality Date   ABDOMINAL HYSTERECTOMY     APPENDECTOMY     JOINT  REPLACEMENT Bilateral 2016   [place]   LAPAROSCOPIC APPENDECTOMY N/A 05/02/2017   Procedure: LAPAROSCOPIC CECECTOMY;  Surgeon: Alben Alma, MD;  Location: [facility];  Service: General;  Laterality: N/A;  Please open Lap appy tray.  Have gelport and Right hemicolectomy cart in the room.    LAPAROSCOPIC RIGHT HEMI COLECTOMY Right 09/26/2017   Procedure: LAPAROSCOPIC RIGHT HEMI COLECTOMY;  Surgeon: Alben Alma, MD;  Location: [facility];  Service: General;  Laterality: Right;   TOTAL ABDOMINAL  HYSTERECTOMY W/ BILATERAL SALPINGOOPHORECTOMY  2001   done at [facility] hosp   TOTAL KNEE ARTHROPLASTY Right 04/03/2015   Procedure: RIGHT TOTAL KNEE ARTHROPLASTY;  Surgeon: Wes Hamman, MD;  Location: [facility] OR;  Service: Orthopedics;  Laterality: Right;   TOTAL KNEE ARTHROPLASTY Left 04/03/2015   Procedure: LEFT TOTAL KNEE ARTHROPLASTY;  Surgeon: Wes Hamman, MD;  Location: [facility] OR;  Service: Orthopedics;  Laterality: Left;   VAGINAL DELIVERY     ?x2    ROS: Review of Systems Negative except as stated above  PHYSICAL EXAM: BP 129/81   Pulse (!) 57   Resp 19   Ht 4\' 5"  (1.346 m)   Wt 126 lb 3.2 oz (57.2 kg)   SpO2 100%   BMI 31.59 kg/m  Wt Readings from Last 3 Encounters:  08/30/23 126 lb 3.2 oz (57.2 kg)  04/21/23 133 lb 11.2 oz (60.6 kg)  01/31/23 134 lb (60.8 kg)     Physical Exam  General appearance - alert, well appearing, older Hispanic female and in no distress Mental status - normal mood, behavior, speech, dress, motor activity, and thought processes Chest - clear to auscultation, no wheezes, rales or rhonchi, symmetric air entry Heart - normal rate, regular rhythm, normal S1, S2, no murmurs, rubs, clicks or gallops Extremities - peripheral pulses normal, no pedal edema, no clubbing or cyanosis      Latest Ref Rng & Units 01/31/2023   12:12 PM 12/02/2020   10:02 AM 04/19/2020    2:43 AM  CMP  Glucose 70 - 99 mg/dL 93  409  811   BUN 6 - 24 mg/dL 16  14  19    Creatinine  0.57 - 1.00 mg/dL 9.14  7.82  9.56   Sodium 134 - 144 mmol/L 140  140  143   Potassium 3.5 - 5.2 mmol/L 4.9  4.7  3.6   Chloride 96 - 106 mmol/L 103  103  106   CO2 20 - 29 mmol/L 23  22  27    Calcium 8.7 - 10.2 mg/dL 9.6  9.7  9.3   Total Protein 6.0 - 8.5 g/dL 7.6  6.8    Total Bilirubin 0.0 - 1.2 mg/dL 0.5  0.4    Alkaline Phos 44 - 121 IU/L 90  82    AST 0 - 40 IU/L 25  27    ALT 0 - 32 IU/L 20  22     Lipid Panel     Component Value Date/Time   CHOL 239 (H) 01/31/2023 1212   TRIG 117 01/31/2023 1212   HDL 58 01/31/2023 1212   CHOLHDL 4.1 01/31/2023 1212   CHOLHDL 3.8 01/21/2016 0921   VLDL 20 01/21/2016 0921   LDLCALC 160 (H) 01/31/2023 1212    CBC    Component Value Date/Time   WBC 7.8 01/31/2023 1212   WBC 7.5 04/19/2020 0243   RBC 4.98 01/31/2023 1212   RBC 4.52 04/19/2020 0243   HGB 15.4 01/31/2023 1212   HCT 46.6 01/31/2023 1212   PLT 279 01/31/2023 1212   MCV 94 01/31/2023 1212   MCH 30.9 01/31/2023 1212   MCH 31.2 04/19/2020 0243   MCHC 33.0 01/31/2023 1212   MCHC 33.6 04/19/2020 0243   RDW 12.9 01/31/2023 1212   LYMPHSABS 2.5 12/02/2020 1002   MONOABS 0.4 09/30/2017 0457   EOSABS 0.1 12/02/2020 1002   BASOSABS 0.0 12/02/2020 1002    ASSESSMENT AND PLAN: 1. Obesity (BMI 30.0-34.9) (Primary) Commended her on weight  loss.  Encouraged her to continue healthy eating habits and to move as much as her back will allow.  2. Essential hypertension Repeat blood pressure today is better.  We will continue to monitor off medication.  DASH diet discussed and encouraged.  3. Acute bilateral thoracic back pain Patient has seen orthopedics and is currently pursuing a course of physical therapy which she is finding to be helpful.  4. Need for shingles vaccine - Zoster Vaccine Adjuvanted (SHINGRIX ) injection; Inject 0.5 mLs into the muscle once for 1 dose.  Dispense: 0.5 mL; Refill: 0  5. Encounter for routine eye and vision examination - Ambulatory referral to  Optometry  I have sent a request to her gynecologist to send us  a copy of her recent mammogram report.  Patient was given the opportunity to ask questions.  Patient verbalized understanding of the plan and was able to repeat key elements of the plan.   This documentation was completed using Paediatric nurse.  Any transcriptional errors are unintentional.  No orders of the defined types were placed in this encounter.    Requested Prescriptions   Signed Prescriptions Disp Refills   Zoster Vaccine Adjuvanted (SHINGRIX ) injection 0.5 mL 0    Sig: Inject 0.5 mLs into the muscle once for 1 dose.    No follow-ups on file.  Concetta Dee, MD, FACP

## 2023-09-06 NOTE — Therapy (Signed)
 OUTPATIENT PHYSICAL THERAPY DAILY NOTE/ Recertification   Patient Name: Tina Johnson MRN: [id number] DOB:03-Feb-1966, 58 y.o., female Today's Date: 09/08/2023  END OF SESSION:  PT End of Session - 09/08/23 0847     Visit Number 11    Number of Visits 14    Date for PT Re-Evaluation 10/20/23    Authorization Type CAFA    PT Start Time 0845    PT Stop Time 0930    PT Time Calculation (min) 45 min    Activity Tolerance Patient tolerated treatment well    Behavior During Therapy [facility] for tasks assessed/performed                   Past Medical History:  Diagnosis Date   Abnormality of gait 04/26/2014   Anxiety    controls with exercise    Arthritis    knees , shoulders   Bradycardia 05/31/2017   DEPRESSION 08/14/2007   Qualifier: Diagnosis of  By: Zara Heymann     Family history of diabetes mellitus (DM) 01/23/2014   Genu varum of both lower extremities 03/07/2014   GERD 08/14/2007   Qualifier: Diagnosis of  By: Zara Heymann     HEADACHE, CHRONIC 08/14/2007   Qualifier: Diagnosis of  By: Zara Heymann     HYPERLIPIDEMIA 08/14/2007   Qualifier: Diagnosis of  By: Zara Heymann     Indigestion    no medicine at present   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   Neoplasm of appendix    OSTEOARTHRITIS 08/14/2007   Qualifier: Diagnosis of  By: Zara Heymann     Other bilateral secondary osteoarthritis of knee 04/03/2015   PONV (postoperative nausea and vomiting) 1999   partial hysterectomy in [place]   Right knee DJD 01/23/2014   S/P TKR (total knee replacement), 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   Have gelport and Right hemicolectomy cart in the room.    LAPAROSCOPIC RIGHT HEMI COLECTOMY Right 09/26/2017   Procedure: LAPAROSCOPIC RIGHT HEMI COLECTOMY;  Surgeon: Alben Alma, MD;  Location: [facility] ORS;  Service: General;  Laterality: Right;   TOTAL ABDOMINAL HYSTERECTOMY W/ BILATERAL SALPINGOOPHORECTOMY  2001   done at [facility]   TOTAL KNEE ARTHROPLASTY Right 04/03/2015   Procedure: RIGHT TOTAL KNEE ARTHROPLASTY;  Surgeon: Wes Hamman, MD;  Location: [facility] OR;  Service: Orthopedics;  Laterality: Right;   TOTAL KNEE ARTHROPLASTY Left 04/03/2015   Procedure: LEFT TOTAL KNEE ARTHROPLASTY;  Surgeon: Wes Hamman, MD;  Location: [facility] OR;  Service: Orthopedics;  Laterality: Left;   VAGINAL DELIVERY     ?x2   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   Bradycardia 05/31/2017   S/P TKR (total knee replacement), bilateral 10/07/2016   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   Genu varum of both lower extremities 03/07/2014   Family history of diabetes mellitus (DM) 01/23/2014   Right knee DJD 01/23/2014  Hyperlipidemia 01/23/2014   HYPERLIPIDEMIA 08/14/2007   DEPRESSION 08/14/2007   GERD 08/14/2007   Headache 08/14/2007    PCP: Lawrance Presume, MD   REFERRING PROVIDER: Jude Norton, MD   REFERRING DIAG: [phone] (ICD-10-CM) - Chronic low back pain, unspecified back pain laterality, unspecified whether sciatica present   Rationale for Evaluation and Treatment: Rehabilitation  THERAPY DIAG:  Chronic left-sided low back pain with left-sided sciatica - Plan: PT plan of care cert/re-cert  Pain in thoracic spine - Plan: PT plan of care cert/re-cert  Muscle weakness (generalized) - Plan: PT plan of care cert/re-cert  ONSET DATE: onset 4 years ago but more recently exacerbating pain in  last 2 months  SUBJECTIVE:                                                                                                                                                                                           SUBJECTIVE STATEMENT:  Pt tried to do some of the lifting yesterday but she couldn't maintain straight back like she should. Min pain today 2/10.  Pt expresses fearfulness of lifting and progressing exercises in gym.  Pt is nearing DC but expresses a need for reinforcing HEP and lifting techniques    EVAL: Through interpreter, pt explains she has had pain for about 4 years but she did exercise and it helped her pain but in the last 2 months it has gotten worse and exercise don't help. After 2 hours of sitting pain begins.  Pain is worse when she is sitting  but walking is better for pain. Stand is OK but she is having more pain down her left side. She went the whole day mostly standing and last night when she was lying down and husband massaging her left lower leg the pain went away in her back. 4 years ago her back pain started with no incident.  More in low back and upper back  PERTINENT HISTORY:  OA bil knees, GERD, hyperlipidemia, TKA bil for genu knee varus. Dec 2015 bil, appy,   PAIN:  Are you having pain? Yes: NPRS scale: at rest 7/10 now and at worst 10/10  Pain location: thoracic  and low back Pain description: spasming achy pain Aggravating factors: lifting household items like appliances to make tamales  about 14-15 pounds, sometimes disrupts sleep Relieving factors: movement makes better  PRECAUTIONS: None  RED FLAGS: None   WEIGHT BEARING RESTRICTIONS: No  FALLS:  Has patient fallen in last 6 months? 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   PLOF: Independent  PATIENT GOALS: Feel  better and be able to return to gym  NEXT MD VISIT: TBD  OBJECTIVE:  Note: Objective measures were completed at Evaluation unless otherwise noted.  DIAGNOSTIC FINDINGS:  2 view x-rays of the lumbar spine show lordosis of the lumbar spine, there  is some noted retrolisthesis of L4-L5.  Some noted spurring throughout the  lumbar spine and mild sclerotic changes.  Disc height does appear to be  narrower at L4-L5 and L1-L2.  Patient also noted to have some mild  thoracic disc height narrowing  See medical records for x rays  PATIENT SURVEYS:  Modified Oswestry 30%   09-08-23 ODI 14% COGNITION: Overall cognitive status: Within functional limits for tasks assessed     SENSATION: WFL  MUSCLE LENGTH: Hamstrings: slight tightness L > R   POSTURE: rounded shoulders, forward head, and pelvic level higher on left side  PALPATION: Pt with TTP over thoracic lumbar paraspinals  LUMBAR ROM:   [facility] eval 08/16/23 09-08-23  Flexion Fingertips to ankles * Finger tips to ankles, no back pain  Fingertips to floor with no pain  Extension 50% decrease *   75%  Right lateral flexion [facility]  WFL  Left lateral flexion [facility]  WFL  Right rotation 50% 75% 75%  Left rotation 50% 75%  75%  Key: WFL = within functional limits not formally assessed, * = concordant pain, s = stiffness/stretching sensation, NT = not tested)  LOWER EXTREMITY ROM:     Active  Right eval Left eval R/L 09-08-23  Hip flexion 70standing 70 standing * WFL/WFL > 100 degrees  Hip extension     Hip abduction     Hip adduction     Hip internal rotation     Hip external rotation     Knee flexion   WFL/WFL  Knee extension     Ankle dorsiflexion     Ankle plantarflexion     Ankle inversion     Ankle eversion     (Blank rows = not tested, score listed is out of 5 possible points.  N = WNL, D = diminished, C = clear for gross weakness with myotome testing, * = concordant pain with testing)   LOWER EXTREMITY MMT:    MMT  Right eval Left eval R/L 09-08-23  Hip flexion 4+ 4+ 5/5  Hip extension 4 4 4+/4+  Hip abduction 4 4- 4/4  Hip adduction     Hip internal rotation     Hip external rotation     Knee flexion 4+ 4+ 4+/4+  Knee extension 4+ 4- 4+/4+  Ankle dorsiflexion     Ankle plantarflexion     Ankle inversion     Ankle eversion      (Blank rows = not tested)  LUMBAR SPECIAL TESTS:  Straight leg raise test: Negative, Slump test: Negative, and FABER test: Negative But pt with limited ER bil FUNCTIONAL TESTS:  5 times sit to stand: 11.26 sec 2 minute walk test: TBD 08/09/23: 6 MWT :1527 feet no increased pain Plank frontal  39 sec  Sidelying plank 15 sec on right sidelying Sidelying plank 19 sec on left  GAIT: Distance walked: 150 to clinic Assistive device utilized: None Level of assistance: Complete Independence Comments: Pt with slight antalgic gait disturbance  [facility] Adult PT Treatment:  DATE: 09-08-23 Therapeutic Exercise: Dead bug- progression and regression needs moderate cuing Dead bug with use of pilates ring  mod cuing Review questions on HEP  Therapeutic Activity: STS with 15 # KB 3 x 10 Deadlifting demo with 30# KB, Needs mod curing for correct lifting Deadlifting with 25 # training bar needs mod cuing for correct lifting  SELF CARE: educated on lifting principles and benefits of trainers post DC from PT for guided lifting and progression  [facility] Adult PT Treatment:                                                DATE: 08/29/23 Therapeutic Exercise: Recumbent bike L 1 for 5 min 2 pillows  Therapeutic Activity: Technique for lifting/pressing with leg press 1 plate x 10 x 2  Wall squat with ball x 10  Added an isometric hold  Bridging with ball  Lower ab with dumbbell 5# 90/90 hold  Knee ext Lat pull over  Dead bug- progression and regression Self Care: The role for and presenting back pain With modified exercises in a neutral  core position Adding load with dumbbell maintaining neutral position Time commitment for home program (was doing 1.5 hours)    08/25/23: Therapeutic Activity: Bike - 5 min - L2 Demonstration of common gym machines including knee flexion, knee ext, chest press, low and high row, hack squat.  Pt provided advise on form, ideal sets and reps, ideal weight, how to progress.   [facility] Adult PT Treatment:                                                DATE: 08-18-23 Therapeutic Exercise: Thoracic book opening side-lying on left and right 10 x 90/90 abdominal hold 10 sec x 5  LTR  Therapeutic Activity: Nustep L5 Ue/Le x 5 minutes  Hip hinge STS with   15 # KB x 10 Deadlift with 15 #  5 x 2 followed by standing extension stretch Deadlift  with 25 #  5 x 2  followed by standing extension stretch Suit case carry 15 # 50 feet x 4 alternating sides   Self Care: Self spinal mobs using double tennis ball over thoracic spine and Left shoulder blade between spine and scapula Return demo by PT   Lifting principles reinforced    HOME EXERCISE PROGRAM: Access Code: [email or web address] URL: [email or web address] Date: 08/02/2023 Prepared by: Lesleigh Rash  Exercises - Supine Pelvic Tilt  - 1 x daily - 7 x weekly - 3 sets - 10 reps - Supine Single Knee to Chest Stretch  - 2 x daily - 7 x weekly - 1 sets - 5 reps - 10 hold - Supine Lower Trunk Rotation  - 2 x daily - 7 x weekly - 1 sets - 5 reps - 20 hold - Supine Sciatic Nerve Glide  - 1 x daily - 7 x weekly - 3 sets - 10 reps - Supine Bridge  - 1 x daily - 7 x weekly - 3 sets - 10 reps - 2-3 seconds hold - Hooklying Clamshell with Resistance  - 1 x daily - 7 x weekly - 3 sets - 10 reps Added 08-04-23 - Supine March with Hands Behind Back  -  1 x daily - 7 x weekly - 3 sets - 10 reps - Sidelying Open Book Thoracic Rotation with Knee on Foam Roll  - 1 x daily - 7 x weekly - 2 sets - 10 reps 08/16/23 - Supine 90/90 Abdominal Bracing  - 1 x daily -  7 x weekly - 1 sets - 5 reps - 10 hold - Kettlebell Deadlift  - 1 x daily - 7 x weekly - 1-2 sets - 10 reps added - Bent Over Single Arm Shoulder Row with Dumbbell  - 1 x daily - 7 x weekly - 3 sets - 10 reps - Standing Hip Hinge with Dowel  - 1 x daily - 7 x weekly - 3 sets - 10 reps - Goblet Squat with Kettlebell  - 1 x daily - 7 x weekly - 3 sets - 10 reps - Kettlebell Deadlift  - 1 x daily - 7 x weekly - 3 sets - 8 reps - Farmer's Carry with Kettlebells  - 1 x daily - 7 x weekly - 2 sets - 8 reps   ASSESSMENT:  CLINICAL IMPRESSION: 08/29/23: Session focused on establishing a structured routine and which exercises to do and when in order to given her an efficient and effective workout. Pt with interpreter expressed concerns about lifting weights in gym and educated and reinforced on deadlifting and proper lifting practice. Pt requires moderate cuing for good technique and will benefit from extending visits for 3 x in order to solidify proper form and transition to gym/trainer.  Benefits of trainer was explained especially with progressing weight safely at gym.  Pt will be given good HEP before DC.  She was able to work on core with cues for alignment in a neutral position.  No increased pain during  session today and complains about occassional 2/10 pain with lifting heavier weights. She was able to hold a low plank for 45 sec without increased pain but has inadequate form for side planking and can only hold for 15-19 secondes.  Patient required increased time and motor control cues to integrate in alternating dead bug pattern. Cont POC and extend 3 x in the next 6 weeks to solidify HEP and transition to gym post DC  EVAL: Patient is a 58 y.o. female who was seen today for physical therapy evaluation and treatment for left sided low back pain with left radiculopathy and thoracic back pain. Pt formerly seen with bil TKA and benefited from PT.  Pt is routinely active and had controlled her 4 years of  back pain with exercise but in last 2 months pt is unable to alleviate pain with exercise alone.  Pt is motivated to exercise but has not been able to return to gym in 2 and 1/2 months. Pt will benefit from skilled PT to address impairments and ongoing intermittent pain  OBJECTIVE IMPAIRMENTS: decreased activity tolerance, decreased mobility, difficulty walking, decreased ROM, decreased strength, increased muscle spasms, improper body mechanics, postural dysfunction, obesity, and pain.   ACTIVITY LIMITATIONS: carrying, lifting, bending, sitting, standing, squatting, reach over head, and locomotion level  PARTICIPATION LIMITATIONS: meal prep, cleaning, laundry, driving, and community activity  PERSONAL FACTORS: OA bil knees, GERD, hyperlipidemia, TKA bil for genu knee varus. Dec 2015 bil, appy, are also affecting patient's functional outcome.   REHAB POTENTIAL: Good  CLINICAL DECISION MAKING: Evolving/moderate complexity  EVALUATION COMPLEXITY: Moderate   GOALS: Goals reviewed with patient? Yes  SHORT TERM GOALS: Target date: 08-16-23  Pt will be I with  initial HEP Baseline:no knowledge 08-11-23 I was better with my exercises when I started reading them.  The videos are in English Goal status: MET for initial exercise supine  2.  Report pain decrease from 10/10 to 5/10 Baseline: 10/10 at worst 08-04-23  2/10 08-11-23  0/10 pain but only occasionally Goal status: MET 09-08-23  3.  Demonstrate understanding of neutral posture and be more conscious of position and posture throughout the day.  Baseline: limited knowledge 08-11-23 Goal status: MET  4.  Pt will be able to increase lumbar rotation and lumbar flexion in order to comfortable perform household chores Baseline: See [facility] chart 08/16/23: lumbar rotation improved  Goal status:MET 09-08-23    LONG TERM GOALS: Target date: 09-10-23  revised 10-20-23  Pt will be independent with advanced HEP Baseline: no knowledge Goal status:  ONGOING  2.  Pt will be able to perform planks for 30 seconds without increasing pain Baseline: 10 sec max 09-08-23  frontal plank  45 sec but side planks about 15-19-sec Goal status: ONGOING  3.  Pt will use household cooking equipment  for making tamales about 15 #  Baseline: unable to lift any weight 08-23-23 able to lift 45# Goal status:MET  4.  Pt will improve ODI to at least 18% Baseline: eval 30% 09-08-23  14 % Goal status: MET  5.  Demonstrate and verbalize techniques to reduce the risk of re-injury including: lifting, posture, body mechanics.  Baseline: no knowledge 08-23-23 Reviewed and demonstrated Goal status: MET  6.  Pt will be able to perform within normal range for age [phone] ft) Baseline: NT at eval 08/09/23: 1527 feet  Goal status: ONGOING  7.  Pt will be educated and return demo on gym equipment in clinic in order to transition to gym at DC  09-08-23  Pt needs moderate cuing on equipment  Goal status: INITIAL  8. Pt will be able to perform sidelying planks for 45 sec with good form and no compensations.   09-08-23  15-19-sec  Goal Status: INITIAL  9. Pt will be able to lift with lift bar with 50 # in clinic in order to show good form for transition to gym without fear of lifting.   09-08-23  30# KB  Goal status: INITIAL  PLAN:  PT FREQUENCY: 1-2x/week  PT DURATION: 6 weeks  PLANNED INTERVENTIONS: [id number]- PT Re-evaluation, 97110-Therapeutic exercises, 97530- Therapeutic activity, 97112- Neuromuscular re-education, 97535- Self Care, 86578- Manual therapy, [id number]- Gait training, [phone]- Electrical stimulation (manual), Patient/Family education, Balance training, Stair training, Taping, Dry Needling, Joint mobilization, Spinal mobilization, Cryotherapy, and Moist heat.  PLAN FOR NEXT SESSION: cont core and development of HEP, gym ex.  , capacity to carry household equipment and return to gym for exercise    Sharlet Dawson, PT, ATR[facility] Certified Exercise  Expert for the Aging Adult  09/08/23 10:02 AM Phone: [phone] Fax: [phone]

## 2023-09-08 ENCOUNTER — Encounter: Payer: Self-pay | Admitting: Physical Therapy

## 2023-09-08 ENCOUNTER — Ambulatory Visit: Payer: Self-pay | Admitting: Physical Therapy

## 2023-09-08 DIAGNOSIS — G8929 Other chronic pain: Secondary | ICD-10-CM

## 2023-09-08 DIAGNOSIS — M6281 Muscle weakness (generalized): Secondary | ICD-10-CM

## 2023-09-08 DIAGNOSIS — M546 Pain in thoracic spine: Secondary | ICD-10-CM

## 2023-09-16 ENCOUNTER — Encounter: Payer: Self-pay | Admitting: Physical Therapy

## 2023-09-16 ENCOUNTER — Ambulatory Visit: Payer: Self-pay | Admitting: Physical Therapy

## 2023-09-16 DIAGNOSIS — M6281 Muscle weakness (generalized): Secondary | ICD-10-CM

## 2023-09-16 DIAGNOSIS — G8929 Other chronic pain: Secondary | ICD-10-CM

## 2023-09-16 NOTE — Therapy (Signed)
 OUTPATIENT PHYSICAL THERAPY DAILY NOTE   Patient Name: Tina Johnson MRN: [id number] DOB:09/12/1965, 58 y.o., female Today's Date: 09/16/2023  END OF SESSION:  PT End of Session - 09/16/23 1152     Visit Number 12    Number of Visits 14    Date for PT Re-Evaluation 10/20/23    Authorization Type CAFA    PT Start Time 1145    PT Stop Time 1230    PT Time Calculation (min) 45 min                   Past Medical History:  Diagnosis Date   Abnormality of gait 04/26/2014   Anxiety    controls with exercise    Arthritis    knees , shoulders   Bradycardia 05/31/2017   DEPRESSION 08/14/2007   Qualifier: Diagnosis of  By: Zara Heymann     Family history of diabetes mellitus (DM) 01/23/2014   Genu varum of both lower extremities 03/07/2014   GERD 08/14/2007   Qualifier: Diagnosis of  By: Zara Heymann     HEADACHE, CHRONIC 08/14/2007   Qualifier: Diagnosis of  By: Zara Heymann     HYPERLIPIDEMIA 08/14/2007   Qualifier: Diagnosis of  By: Zara Heymann     Indigestion    no medicine at present   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   Neoplasm of appendix    OSTEOARTHRITIS 08/14/2007   Qualifier: Diagnosis of  By: Zara Heymann     Other bilateral secondary osteoarthritis of knee 04/03/2015   PONV (postoperative nausea and vomiting) 1999   partial hysterectomy in [place]   Right knee DJD 01/23/2014   S/P TKR (total knee replacement), bilateral 10/07/2016   Past Surgical History:  Procedure Laterality Date   ABDOMINAL HYSTERECTOMY     APPENDECTOMY     JOINT REPLACEMENT Bilateral 2016   [place]   LAPAROSCOPIC APPENDECTOMY N/A 05/02/2017   Procedure: LAPAROSCOPIC CECECTOMY;  Surgeon: Alben Alma, MD;  Location: [facility] ORS;  Service: General;  Laterality: N/A;  Please open Lap appy tray.  Have gelport and Right hemicolectomy cart in the room.    LAPAROSCOPIC RIGHT HEMI COLECTOMY Right 09/26/2017   Procedure: LAPAROSCOPIC  RIGHT HEMI COLECTOMY;  Surgeon: Alben Alma, MD;  Location: [facility];  Service: General;  Laterality: Right;   TOTAL ABDOMINAL HYSTERECTOMY W/ BILATERAL SALPINGOOPHORECTOMY  2001   done at [facility]   TOTAL KNEE ARTHROPLASTY Right 04/03/2015   Procedure: RIGHT TOTAL KNEE ARTHROPLASTY;  Surgeon: Wes Hamman, MD;  Location: [facility] OR;  Service: Orthopedics;  Laterality: Right;   TOTAL KNEE ARTHROPLASTY Left 04/03/2015   Procedure: LEFT TOTAL KNEE ARTHROPLASTY;  Surgeon: Wes Hamman, MD;  Location: [facility] OR;  Service: Orthopedics;  Laterality: Left;   VAGINAL DELIVERY     ?x2   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   Bradycardia 05/31/2017   S/P TKR (total knee replacement), bilateral 10/07/2016   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   Genu varum of both lower extremities 03/07/2014   Family history of diabetes mellitus (DM) 01/23/2014   Right knee DJD 01/23/2014   Hyperlipidemia 01/23/2014   HYPERLIPIDEMIA 08/14/2007   DEPRESSION 08/14/2007   GERD 08/14/2007   Headache 08/14/2007  PCP: Lawrance Presume, MD   REFERRING PROVIDER: Jude Norton, MD   REFERRING DIAG: M54.50,G89.29 (ICD-10-CM) - Chronic low back pain, unspecified back pain laterality, unspecified whether sciatica present   Rationale for Evaluation and Treatment: Rehabilitation  THERAPY DIAG:  Chronic left-sided low back pain with left-sided sciatica  Muscle weakness (generalized)  ONSET DATE: onset 4 years ago but more recently exacerbating pain in last 2 months  SUBJECTIVE:                                                                                                                                                                                           SUBJECTIVE STATEMENT: 09/16/23: Pt arrives with 3/10  LBP. Has seen a personal trainer that would like to know her diagnosis.    Pt tried to do some of the lifting yesterday but she couldn't maintain straight back like she should. Min pain today 2/10.  Pt expresses fearfulness of lifting and progressing exercises in gym.  Pt is nearing DC but expresses a need for reinforcing HEP and lifting techniques.    EVAL: Through interpreter, pt explains she has had pain for about 4 years but she did exercise and it helped her pain but in the last 2 months it has gotten worse and exercise don't help. After 2 hours of sitting pain begins.  Pain is worse when she is sitting  but walking is better for pain. Stand is OK but she is having more pain down her left side. She went the whole day mostly standing and last night when she was lying down and husband massaging her left lower leg the pain went away in her back. 4 years ago her back pain started with no incident.  More in low back and upper back  PERTINENT HISTORY:  OA bil knees, GERD, hyperlipidemia, TKA bil for genu knee varus. Dec 2015 bil, appy,   PAIN:  Are you having pain? Yes: NPRS scale: at rest 3/10 now and at worst 10/10  Pain location: thoracic  and low back Pain description: spasming achy pain Aggravating factors: lifting household items like appliances to make tamales  about 14-15 pounds, sometimes disrupts sleep Relieving factors: movement makes better  PRECAUTIONS: None  RED FLAGS: None   WEIGHT BEARING RESTRICTIONS: No  FALLS:  Has patient fallen in last 6 months? 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   PLOF: Independent  PATIENT GOALS: Feel better and be able to return to gym  NEXT  MD VISIT: TBD  OBJECTIVE:  Note: Objective measures were completed at Evaluation unless otherwise noted.  DIAGNOSTIC  FINDINGS:  2 view x-rays of the lumbar spine show lordosis of the lumbar spine, there  is some noted retrolisthesis of L4-L5.  Some noted spurring throughout the  lumbar spine and mild sclerotic changes.  Disc height does appear to be  narrower at L4-L5 and L1-L2.  Patient also noted to have some mild  thoracic disc height narrowing  See medical records for x rays  PATIENT SURVEYS:  Modified Oswestry 30%   09-08-23 ODI 14% COGNITION: Overall cognitive status: Within functional limits for tasks assessed     SENSATION: WFL  MUSCLE LENGTH: Hamstrings: slight tightness L > R   POSTURE: rounded shoulders, forward head, and pelvic level higher on left side  PALPATION: Pt with TTP over thoracic lumbar paraspinals  LUMBAR ROM:   [facility] eval 08/16/23 09-08-23  Flexion Fingertips to ankles * Finger tips to ankles, no back pain  Fingertips to floor with no pain  Extension 50% decrease *   75%  Right lateral flexion [facility]  WFL  Left lateral flexion [facility]  WFL  Right rotation 50% 75% 75%  Left rotation 50% 75%  75%  Key: WFL = within functional limits not formally assessed, * = concordant pain, s = stiffness/stretching sensation, NT = not tested)  LOWER EXTREMITY ROM:     Active  Right eval Left eval R/L 09-08-23  Hip flexion 70standing 70 standing * WFL/WFL > 100 degrees  Hip extension     Hip abduction     Hip adduction     Hip internal rotation     Hip external rotation     Knee flexion   WFL/WFL  Knee extension     Ankle dorsiflexion     Ankle plantarflexion     Ankle inversion     Ankle eversion     (Blank rows = not tested, score listed is out of 5 possible points.  N = WNL, D = diminished, C = clear for gross weakness with myotome testing, * = concordant pain with testing)   LOWER EXTREMITY MMT:    MMT Right eval Left eval R/L 09-08-23  Hip flexion 4+ 4+ 5/5  Hip extension 4 4 4+/4+  Hip abduction 4 4- 4/4  Hip adduction     Hip internal rotation     Hip external  rotation     Knee flexion 4+ 4+ 4+/4+  Knee extension 4+ 4- 4+/4+  Ankle dorsiflexion     Ankle plantarflexion     Ankle inversion     Ankle eversion      (Blank rows = not tested)  LUMBAR SPECIAL TESTS:  Straight leg raise test: Negative, Slump test: Negative, and FABER test: Negative But pt with limited ER bil FUNCTIONAL TESTS:  5 times sit to stand: 11.26 sec 2 minute walk test: TBD 08/09/23: 6 MWT :1527 feet no increased pain Plank frontal  39 sec  Sidelying plank 15 sec on right sidelying Sidelying plank 19 sec on left  GAIT: Distance walked: 150 to clinic Assistive device utilized: None Level of assistance: Complete Independence Comments: Pt with slight antalgic gait disturbance  [facility] Adult PT Treatment:  DATE: 09/16/23 Therapeutic Exercise: Plank on knees 45 sec Plank on toes 20 sec Side plank on forearm 15 sec x 2 each side  Dead bug from 90/90 to fatigue x 2  Dead  lit 30# KB from floor 10 x 2  Squat 15# at chest (STS) 10 x 2      [facility] Adult PT Treatment:                                                DATE: 10/03/2023 Therapeutic Exercise: Dead bug- progression and regression needs moderate cuing Dead bug with use of pilates ring  mod cuing Review questions on HEP  Therapeutic Activity: STS with 15 # KB 3 x 10 Deadlifting demo with 30# KB, Needs mod curing for correct lifting Deadlifting with 25 # training bar needs mod cuing for correct lifting  SELF CARE: educated on lifting principles and benefits of trainers post DC from PT for guided lifting and progression  [facility] Adult PT Treatment:                                                DATE: 08/29/23 Therapeutic Exercise: Recumbent bike L 1 for 5 min 2 pillows  Therapeutic Activity: Technique for lifting/pressing with leg press 1 plate x 10 x 2  Wall squat with ball x 10  Added an isometric hold  Bridging with ball  Lower ab with dumbbell 5# 90/90 hold  Knee  ext Lat pull over  Dead bug- progression and regression Self Care: The role for and presenting back pain With modified exercises in a neutral core position Adding load with dumbbell maintaining neutral position Time commitment for home program (was doing 1.5 hours)    08/25/23: Therapeutic Activity: Bike - 5 min - L2 Demonstration of common gym machines including knee flexion, knee ext, chest press, low and high row, hack squat.  Pt provided advise on form, ideal sets and reps, ideal weight, how to progress.   [facility] Adult PT Treatment:                                                DATE: 08-18-23 Therapeutic Exercise: Thoracic book opening side-lying on left and right 10 x 90/90 abdominal hold 10 sec x 5  LTR  Therapeutic Activity: Nustep L5 Ue/Le x 5 minutes  Hip hinge STS with   15 # KB x 10 Deadlift with 15 #  5 x 2 followed by standing extension stretch Deadlift  with 25 #  5 x 2  followed by standing extension stretch Suit case carry 15 # 50 feet x 4 alternating sides   Self Care: Self spinal mobs using double tennis ball over thoracic spine and Left shoulder blade between spine and scapula Return demo by PT   Lifting principles reinforced    HOME EXERCISE PROGRAM: Access Code: [email or web address] URL: [email or web address] Date: 08/02/2023 Prepared by: Lesleigh Rash  Exercises - Supine Pelvic Tilt  - 1 x daily - 7 x weekly - 3 sets - 10 reps - Supine Single Knee to Chest Stretch  - 2 x  daily - 7 x weekly - 1 sets - 5 reps - 10 hold - Supine Lower Trunk Rotation  - 2 x daily - 7 x weekly - 1 sets - 5 reps - 20 hold - Supine Sciatic Nerve Glide  - 1 x daily - 7 x weekly - 3 sets - 10 reps - Supine Bridge  - 1 x daily - 7 x weekly - 3 sets - 10 reps - 2-3 seconds hold - Hooklying Clamshell with Resistance  - 1 x daily - 7 x weekly - 3 sets - 10 reps Added 08-04-23 - Supine March with Hands Behind Back  - 1 x daily - 7 x weekly - 3 sets - 10 reps - Sidelying Open  Book Thoracic Rotation with Knee on Foam Roll  - 1 x daily - 7 x weekly - 2 sets - 10 reps 08/16/23 - Supine 90/90 Abdominal Bracing  - 1 x daily - 7 x weekly - 1 sets - 5 reps - 10 hold - Kettlebell Deadlift  - 1 x daily - 7 x weekly - 1-2 sets - 10 reps added - Bent Over Single Arm Shoulder Row with Dumbbell  - 1 x daily - 7 x weekly - 3 sets - 10 reps - Standing Hip Hinge with Dowel  - 1 x daily - 7 x weekly - 3 sets - 10 reps - Goblet Squat with Kettlebell  - 1 x daily - 7 x weekly - 3 sets - 10 reps - Kettlebell Deadlift  - 1 x daily - 7 x weekly - 3 sets - 8 reps - Farmer's Carry with Kettlebells  - 1 x daily - 7 x weekly - 2 sets - 8 reps   ASSESSMENT:  CLINICAL IMPRESSION: 09/16/23: Pt reports 3/10 back pain on arrival. Has met with personal trainer and has work out later today scheduled. She notes improved tolerance to sitting. She demonstrates correct form with lifting today. Also work on core strengthening which she completed without increased pain. She would like to finish her remaining visits to check her progress.   08/29/23: Session focused on establishing a structured routine and which exercises to do and when in order to given her an efficient and effective workout. Pt with interpreter expressed concerns about lifting weights in gym and educated and reinforced on deadlifting and proper lifting practice. Pt requires moderate cuing for good technique and will benefit from extending visits for 3 x in order to solidify proper form and transition to gym/trainer.  Benefits of trainer was explained especially with progressing weight safely at gym.  Pt will be given good HEP before DC.  She was able to work on core with cues for alignment in a neutral position.  No increased pain during  session today and complains about occassional 2/10 pain with lifting heavier weights. She was able to hold a low plank for 45 sec without increased pain but has inadequate form for side planking and can only  hold for 15-19 secondes.  Patient required increased time and motor control cues to integrate in alternating dead bug pattern. Cont POC and extend 3 x in the next 6 weeks to solidify HEP and transition to gym post DC  EVAL: Patient is a 58 y.o. female who was seen today for physical therapy evaluation and treatment for left sided low back pain with left radiculopathy and thoracic back pain. Pt formerly seen with bil TKA and benefited from PT.  Pt is routinely active and had  controlled her 4 years of back pain with exercise but in last 2 months pt is unable to alleviate pain with exercise alone.  Pt is motivated to exercise but has not been able to return to gym in 2 and 1/2 months. Pt will benefit from skilled PT to address impairments and ongoing intermittent pain  OBJECTIVE IMPAIRMENTS: decreased activity tolerance, decreased mobility, difficulty walking, decreased ROM, decreased strength, increased muscle spasms, improper body mechanics, postural dysfunction, obesity, and pain.   ACTIVITY LIMITATIONS: carrying, lifting, bending, sitting, standing, squatting, reach over head, and locomotion level  PARTICIPATION LIMITATIONS: meal prep, cleaning, laundry, driving, and community activity  PERSONAL FACTORS: OA bil knees, GERD, hyperlipidemia, TKA bil for genu knee varus. Dec 2015 bil, appy, are also affecting patient's functional outcome.   REHAB POTENTIAL: Good  CLINICAL DECISION MAKING: Evolving/moderate complexity  EVALUATION COMPLEXITY: Moderate   GOALS: Goals reviewed with patient? Yes  SHORT TERM GOALS: Target date: 08-16-23  Pt will be I with initial HEP Baseline:no knowledge 08-11-23 I was better with my exercises when I started reading them.  The videos are in English Goal status: MET for initial exercise supine  2.  Report pain decrease from 10/10 to 5/10 Baseline: 10/10 at worst 08-04-23  2/10 08-11-23  0/10 pain but only occasionally Goal status: MET 09-08-23  3.  Demonstrate  understanding of neutral posture and be more conscious of position and posture throughout the day.  Baseline: limited knowledge 08-11-23 Goal status: MET  4.  Pt will be able to increase lumbar rotation and lumbar flexion in order to comfortable perform household chores Baseline: See [facility] chart 08/16/23: lumbar rotation improved  Goal status:MET 09-08-23    LONG TERM GOALS: Target date: 09-10-23  revised 10-20-23  Pt will be independent with advanced HEP Baseline: no knowledge Goal status: ONGOING  2.  Pt will be able to perform planks for 30 seconds without increasing pain Baseline: 10 sec max 09-08-23  frontal plank  45 sec but side planks about 15-19-sec Goal status: ONGOING  3.  Pt will use household cooking equipment  for making tamales about 15 #  Baseline: unable to lift any weight 08-23-23 able to lift 45# Goal status:MET  4.  Pt will improve ODI to at least 18% Baseline: eval 30% 09-08-23  14 % Goal status: MET  5.  Demonstrate and verbalize techniques to reduce the risk of re-injury including: lifting, posture, body mechanics.  Baseline: no knowledge 08-23-23 Reviewed and demonstrated Goal status: MET  6.  Pt will be able to perform within normal range for age [phone] ft) Baseline: NT at eval 08/09/23: 1527 feet  Goal status: ONGOING  7.  Pt will be educated and return demo on gym equipment in clinic in order to transition to gym at DC  09-08-23  Pt needs moderate cuing on equipment  Goal status: INITIAL  8. Pt will be able to perform sidelying planks for 45 sec with good form and no compensations.   09-08-23  15-19-sec  Goal Status: INITIAL  9. Pt will be able to lift with lift bar with 50 # in clinic in order to show good form for transition to gym without fear of lifting.   09-08-23  30# KB  Goal status: INITIAL  PLAN:  PT FREQUENCY: 1-2x/week  PT DURATION: 6 weeks  PLANNED INTERVENTIONS: [id number]- PT Re-evaluation, 97110-Therapeutic exercises, 97530-  Therapeutic activity, 97112- Neuromuscular re-education, 97535- Self Care, 28413- Manual therapy, [id number]- Gait training, [phone]- Electrical stimulation (manual), Patient/Family education,  Balance training, Stair training, Taping, Dry Needling, Joint mobilization, Spinal mobilization, Cryotherapy, and Moist heat.  PLAN FOR NEXT SESSION: cont core and development of HEP, gym ex.  , capacity to carry household equipment and return to gym for exercise    Gasper Karst, PTA 09/16/23 2:18 PM Phone: [phone] Fax: [phone]

## 2023-09-21 ENCOUNTER — Ambulatory Visit: Payer: Self-pay | Attending: Cardiovascular Disease | Admitting: Physical Therapy

## 2023-09-21 ENCOUNTER — Encounter: Payer: Self-pay | Admitting: Physical Therapy

## 2023-09-21 DIAGNOSIS — M546 Pain in thoracic spine: Secondary | ICD-10-CM

## 2023-09-21 DIAGNOSIS — G8929 Other chronic pain: Secondary | ICD-10-CM

## 2023-09-21 DIAGNOSIS — M5442 Lumbago with sciatica, left side: Secondary | ICD-10-CM | POA: Insufficient documentation

## 2023-09-21 DIAGNOSIS — M6281 Muscle weakness (generalized): Secondary | ICD-10-CM

## 2023-09-21 NOTE — Therapy (Signed)
 OUTPATIENT PHYSICAL THERAPY DAILY NOTE   Patient Name: Tina Johnson MRN: [id number] DOB:07/10/1965, 58 y.o., female Today's Date: 09/21/2023  END OF SESSION:  PT End of Session - 09/21/23 1232     Visit Number 13    Number of Visits 14    Date for PT Re-Evaluation 10/20/23    Authorization Type CAFA    PT Start Time 1145    PT Stop Time 1226    PT Time Calculation (min) 41 min              Past Medical History:  Diagnosis Date   Abnormality of gait 04/26/2014   Anxiety    controls with exercise    Arthritis    knees , shoulders   Bradycardia 05/31/2017   DEPRESSION 08/14/2007   Qualifier: Diagnosis of  By: Zara Heymann     Family history of diabetes mellitus (DM) 01/23/2014   Genu varum of both lower extremities 03/07/2014   GERD 08/14/2007   Qualifier: Diagnosis of  By: Zara Heymann     HEADACHE, CHRONIC 08/14/2007   Qualifier: Diagnosis of  By: Zara Heymann     HYPERLIPIDEMIA 08/14/2007   Qualifier: Diagnosis of  By: Zara Heymann     Indigestion    no medicine at present   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   Neoplasm of appendix    OSTEOARTHRITIS 08/14/2007   Qualifier: Diagnosis of  By: Zara Heymann     Other bilateral secondary osteoarthritis of knee 04/03/2015   PONV (postoperative nausea and vomiting) 1999   partial hysterectomy in [place]   Right knee DJD 01/23/2014   S/P TKR (total knee replacement), bilateral 10/07/2016   Past Surgical History:  Procedure Laterality Date   ABDOMINAL HYSTERECTOMY     APPENDECTOMY     JOINT REPLACEMENT Bilateral 2016   [place]   LAPAROSCOPIC APPENDECTOMY N/A 05/02/2017   Procedure: LAPAROSCOPIC CECECTOMY;  Surgeon: Alben Alma, MD;  Location: [facility] OR[facility];  Service: General;  Laterality: N/A;  Please open Lap appy tray.  Have gelport and Right hemicolectomy cart in the room.    LAPAROSCOPIC RIGHT HEMI COLECTOMY Right 09/26/2017   Procedure: LAPAROSCOPIC RIGHT HEMI  COLECTOMY;  Surgeon: Alben Alma, MD;  Location: [facility];  Service: General;  Laterality: Right;   TOTAL ABDOMINAL HYSTERECTOMY W/ BILATERAL SALPINGOOPHORECTOMY  2001   done at [facility]   TOTAL KNEE ARTHROPLASTY Right 04/03/2015   Procedure: RIGHT TOTAL KNEE ARTHROPLASTY;  Surgeon: Wes Hamman, MD;  Location: [facility] OR;  Service: Orthopedics;  Laterality: Right;   TOTAL KNEE ARTHROPLASTY Left 04/03/2015   Procedure: LEFT TOTAL KNEE ARTHROPLASTY;  Surgeon: Wes Hamman, MD;  Location: [facility] OR;  Service: Orthopedics;  Laterality: Left;   VAGINAL DELIVERY     ?x2   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   Bradycardia 05/31/2017   S/P TKR (total knee replacement), bilateral 10/07/2016   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   Genu varum of both lower extremities 03/07/2014   Family history of diabetes mellitus (DM) 01/23/2014   Right knee DJD 01/23/2014   Hyperlipidemia 01/23/2014   HYPERLIPIDEMIA 08/14/2007   DEPRESSION 08/14/2007   GERD 08/14/2007   Headache 08/14/2007    PCP: Concetta Dee  B, MD   REFERRING PROVIDER: Jude Norton, MD   REFERRING DIAG: M54.50,G89.29 (ICD-10-CM) - Chronic low back pain, unspecified back pain laterality, unspecified whether sciatica present   Rationale for Evaluation and Treatment: Rehabilitation  THERAPY DIAG:  Chronic left-sided low back pain with left-sided sciatica  Muscle weakness (generalized)  Pain in thoracic spine  ONSET DATE: onset 4 years ago but more recently exacerbating pain in last 2 months  SUBJECTIVE:                                                                                                                                                                                           SUBJECTIVE STATEMENT: 09/16/23: Pt  reports that she is feeling a bit better and has had no pain over the last several days.    Pt tried to do some of the lifting yesterday but she couldn't maintain straight back like she should. Min pain today 2/10.  Pt expresses fearfulness of lifting and progressing exercises in gym.  Pt is nearing DC but expresses a need for reinforcing HEP and lifting techniques.    EVAL: Through interpreter, pt explains she has had pain for about 4 years but she did exercise and it helped her pain but in the last 2 months it has gotten worse and exercise don't help. After 2 hours of sitting pain begins.  Pain is worse when she is sitting  but walking is better for pain. Stand is OK but she is having more pain down her left side. She went the whole day mostly standing and last night when she was lying down and husband massaging her left lower leg the pain went away in her back. 4 years ago her back pain started with no incident.  More in low back and upper back  PERTINENT HISTORY:  OA bil knees, GERD, hyperlipidemia, TKA bil for genu knee varus. Dec 2015 bil, appy,   PAIN:  Are you having pain? Yes: NPRS scale: at rest 3/10 now and at worst 10/10  Pain location: thoracic  and low back Pain description: spasming achy pain Aggravating factors: lifting household items like appliances to make tamales  about 14-15 pounds, sometimes disrupts sleep Relieving factors: movement makes better  PRECAUTIONS: None  RED FLAGS: None   WEIGHT BEARING RESTRICTIONS: No  FALLS:  Has patient fallen in last 6 months? 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   PLOF: Independent  PATIENT GOALS: Feel better and be able to return  to gym  NEXT MD VISIT: TBD  OBJECTIVE:  Note: Objective measures were completed at Evaluation unless otherwise  noted.  DIAGNOSTIC FINDINGS:  2 view x-rays of the lumbar spine show lordosis of the lumbar spine, there  is some noted retrolisthesis of L4-L5.  Some noted spurring throughout the  lumbar spine and mild sclerotic changes.  Disc height does appear to be  narrower at L4-L5 and L1-L2.  Patient also noted to have some mild  thoracic disc height narrowing  See medical records for x rays  PATIENT SURVEYS:  Modified Oswestry 30%   09-08-23 ODI 14% COGNITION: Overall cognitive status: Within functional limits for tasks assessed     SENSATION: WFL  MUSCLE LENGTH: Hamstrings: slight tightness L > R   POSTURE: rounded shoulders, forward head, and pelvic level higher on left side  PALPATION: Pt with TTP over thoracic lumbar paraspinals  LUMBAR ROM:   [facility] eval 08/16/23 09-08-23  Flexion Fingertips to ankles * Finger tips to ankles, no back pain  Fingertips to floor with no pain  Extension 50% decrease *   75%  Right lateral flexion [facility]  WFL  Left lateral flexion [facility]  WFL  Right rotation 50% 75% 75%  Left rotation 50% 75%  75%  Key: WFL = within functional limits not formally assessed, * = concordant pain, s = stiffness/stretching sensation, NT = not tested)  LOWER EXTREMITY ROM:     Active  Right eval Left eval R/L 09-08-23  Hip flexion 70standing 70 standing * WFL/WFL > 100 degrees  Hip extension     Hip abduction     Hip adduction     Hip internal rotation     Hip external rotation     Knee flexion   WFL/WFL  Knee extension     Ankle dorsiflexion     Ankle plantarflexion     Ankle inversion     Ankle eversion     (Blank rows = not tested, score listed is out of 5 possible points.  N = WNL, D = diminished, C = clear for gross weakness with myotome testing, * = concordant pain with testing)   LOWER EXTREMITY MMT:    MMT Right eval Left eval R/L 09-08-23  Hip flexion 4+ 4+ 5/5  Hip extension 4 4 4+/4+  Hip abduction 4 4- 4/4  Hip adduction     Hip internal rotation      Hip external rotation     Knee flexion 4+ 4+ 4+/4+  Knee extension 4+ 4- 4+/4+  Ankle dorsiflexion     Ankle plantarflexion     Ankle inversion     Ankle eversion      (Blank rows = not tested)  LUMBAR SPECIAL TESTS:  Straight leg raise test: Negative, Slump test: Negative, and FABER test: Negative But pt with limited ER bil FUNCTIONAL TESTS:  5 times sit to stand: 11.26 sec 2 minute walk test: TBD 08/09/23: 6 MWT :1527 feet no increased pain Plank frontal  39 sec  Sidelying plank 15 sec on right sidelying Sidelying plank 19 sec on left  GAIT: Distance walked: 150 to clinic Assistive device utilized: None Level of assistance: Complete Independence Comments: Pt with slight antalgic gait disturbance  [facility] Adult PT Treatment:  DATE: 09/21/23  Therapeutic Activity  Working on set up, body position, lifting mechanics for kitchen tasks involving heavy blender  Included moving 10# weight from counter to stool, simulating pouring into pot on floor, and then lifting pot to counter.  Pot simulated at 10# and ~15# Reviewing gym machines - knee ext, knee flexion, and leg press   [facility] Adult PT Treatment:                                                DATE: 10/01/2023 Therapeutic Exercise: Dead bug- progression and regression needs moderate cuing Dead bug with use of pilates ring  mod cuing Review questions on HEP  Therapeutic Activity: STS with 15 # KB 3 x 10 Deadlifting demo with 30# KB, Needs mod curing for correct lifting Deadlifting with 25 # training bar needs mod cuing for correct lifting  SELF CARE: educated on lifting principles and benefits of trainers post DC from PT for guided lifting and progression  [facility] Adult PT Treatment:                                                DATE: 08/29/23 Therapeutic Exercise: Recumbent bike L 1 for 5 min 2 pillows  Therapeutic Activity: Technique for lifting/pressing with leg press 1 plate x  10 x 2  Wall squat with ball x 10  Added an isometric hold  Bridging with ball  Lower ab with dumbbell 5# 90/90 hold  Knee ext Lat pull over  Dead bug- progression and regression Self Care: The role for and presenting back pain With modified exercises in a neutral core position Adding load with dumbbell maintaining neutral position Time commitment for home program (was doing 1.5 hours)    08/25/23: Therapeutic Activity: Bike - 5 min - L2 Demonstration of common gym machines including knee flexion, knee ext, chest press, low and high row, hack squat.  Pt provided advise on form, ideal sets and reps, ideal weight, how to progress.   [facility] Adult PT Treatment:                                                DATE: 08-18-23 Therapeutic Exercise: Thoracic book opening side-lying on left and right 10 x 90/90 abdominal hold 10 sec x 5  LTR  Therapeutic Activity: Nustep L5 Ue/Le x 5 minutes  Hip hinge STS with   15 # KB x 10 Deadlift with 15 #  5 x 2 followed by standing extension stretch Deadlift  with 25 #  5 x 2  followed by standing extension stretch Suit case carry 15 # 50 feet x 4 alternating sides   Self Care: Self spinal mobs using double tennis ball over thoracic spine and Left shoulder blade between spine and scapula Return demo by PT   Lifting principles reinforced    HOME EXERCISE PROGRAM: Access Code: [email or web address] URL: [email or web address] Date: 08/02/2023 Prepared by: Lesleigh Rash  Exercises - Supine Pelvic Tilt  - 1 x daily - 7 x weekly - 3 sets - 10 reps - Supine Single Knee to Chest Stretch  -  2 x daily - 7 x weekly - 1 sets - 5 reps - 10 hold - Supine Lower Trunk Rotation  - 2 x daily - 7 x weekly - 1 sets - 5 reps - 20 hold - Supine Sciatic Nerve Glide  - 1 x daily - 7 x weekly - 3 sets - 10 reps - Supine Bridge  - 1 x daily - 7 x weekly - 3 sets - 10 reps - 2-3 seconds hold - Hooklying Clamshell with Resistance  - 1 x daily - 7 x weekly - 3 sets  - 10 reps Added 08-04-23 - Supine March with Hands Behind Back  - 1 x daily - 7 x weekly - 3 sets - 10 reps - Sidelying Open Book Thoracic Rotation with Knee on Foam Roll  - 1 x daily - 7 x weekly - 2 sets - 10 reps 08/16/23 - Supine 90/90 Abdominal Bracing  - 1 x daily - 7 x weekly - 1 sets - 5 reps - 10 hold - Kettlebell Deadlift  - 1 x daily - 7 x weekly - 1-2 sets - 10 reps added - Bent Over Single Arm Shoulder Row with Dumbbell  - 1 x daily - 7 x weekly - 3 sets - 10 reps - Standing Hip Hinge with Dowel  - 1 x daily - 7 x weekly - 3 sets - 10 reps - Goblet Squat with Kettlebell  - 1 x daily - 7 x weekly - 3 sets - 10 reps - Kettlebell Deadlift  - 1 x daily - 7 x weekly - 3 sets - 8 reps - Farmer's Carry with Kettlebells  - 1 x daily - 7 x weekly - 2 sets - 8 reps   ASSESSMENT:  CLINICAL IMPRESSION: 09/16/23: Pt reports no back pain over the last several days.  She has been avoiding cooking a particular meal involving moving a heavy blender and filling a pot on the floor so we worked on this today with feedback provided on body positioning, lifting mechanics, and positioning of blender and pot.  Also suggested she may want to get a platform so that she can perform the work on the counter so she does not have to lift the pot.  No increased pain reported.  Reviewed gym machines.  EVAL: Patient is a 58 y.o. female who was seen today for physical therapy evaluation and treatment for left sided low back pain with left radiculopathy and thoracic back pain. Pt formerly seen with bil TKA and benefited from PT.  Pt is routinely active and had controlled her 4 years of back pain with exercise but in last 2 months pt is unable to alleviate pain with exercise alone.  Pt is motivated to exercise but has not been able to return to gym in 2 and 1/2 months. Pt will benefit from skilled PT to address impairments and ongoing intermittent pain  OBJECTIVE IMPAIRMENTS: decreased activity tolerance, decreased  mobility, difficulty walking, decreased ROM, decreased strength, increased muscle spasms, improper body mechanics, postural dysfunction, obesity, and pain.   ACTIVITY LIMITATIONS: carrying, lifting, bending, sitting, standing, squatting, reach over head, and locomotion level  PARTICIPATION LIMITATIONS: meal prep, cleaning, laundry, driving, and community activity  PERSONAL FACTORS: OA bil knees, GERD, hyperlipidemia, TKA bil for genu knee varus. Dec 2015 bil, appy, are also affecting patient's functional outcome.   REHAB POTENTIAL: Good  CLINICAL DECISION MAKING: Evolving/moderate complexity  EVALUATION COMPLEXITY: Moderate   GOALS: Goals reviewed with patient? Yes  SHORT  TERM GOALS: Target date: 08-16-23  Pt will be I with initial HEP Baseline:no knowledge 08-11-23 I was better with my exercises when I started reading them.  The videos are in English Goal status: MET for initial exercise supine  2.  Report pain decrease from 10/10 to 5/10 Baseline: 10/10 at worst 08-04-23  2/10 08-11-23  0/10 pain but only occasionally Goal status: MET 09-08-23  3.  Demonstrate understanding of neutral posture and be more conscious of position and posture throughout the day.  Baseline: limited knowledge 08-11-23 Goal status: MET  4.  Pt will be able to increase lumbar rotation and lumbar flexion in order to comfortable perform household chores Baseline: See [facility] chart 08/16/23: lumbar rotation improved  Goal status:MET 09-08-23    LONG TERM GOALS: Target date: 09-10-23  revised 10-20-23  Pt will be independent with advanced HEP Baseline: no knowledge Goal status: ONGOING  2.  Pt will be able to perform planks for 30 seconds without increasing pain Baseline: 10 sec max 09-08-23  frontal plank  45 sec but side planks about 15-19-sec Goal status: ONGOING  3.  Pt will use household cooking equipment  for making tamales about 15 #  Baseline: unable to lift any weight 08-23-23 able to lift  45# Goal status:MET  4.  Pt will improve ODI to at least 18% Baseline: eval 30% 09-08-23  14 % Goal status: MET  5.  Demonstrate and verbalize techniques to reduce the risk of re-injury including: lifting, posture, body mechanics.  Baseline: no knowledge 08-23-23 Reviewed and demonstrated Goal status: MET  6.  Pt will be able to perform within normal range for age [phone] ft) Baseline: NT at eval 08/09/23: 1527 feet  Goal status: ONGOING  7.  Pt will be educated and return demo on gym equipment in clinic in order to transition to gym at DC  09-08-23  Pt needs moderate cuing on equipment  Goal status: INITIAL  8. Pt will be able to perform sidelying planks for 45 sec with good form and no compensations.   09-08-23  15-19-sec  Goal Status: INITIAL  9. Pt will be able to lift with lift bar with 50 # in clinic in order to show good form for transition to gym without fear of lifting.   09-08-23  30# KB  Goal status: INITIAL  PLAN:  PT FREQUENCY: 1-2x/week  PT DURATION: 6 weeks  PLANNED INTERVENTIONS: 97164- PT Re-evaluation, 97110-Therapeutic exercises, 97530- Therapeutic activity, 97112- Neuromuscular re-education, 97535- Self Care, [id number]- Manual therapy, [id number]- Gait training, [phone]- Electrical stimulation (manual), Patient/Family education, Balance training, Stair training, Taping, Dry Needling, Joint mobilization, Spinal mobilization, Cryotherapy, and Moist heat.  PLAN FOR NEXT SESSION: cont core and development of HEP, gym ex.  , capacity to carry household equipment and return to gym for exercise    Lesleigh Rash PT, DPT 09/21/23 12:49 PM Phone: [phone] Fax: [phone]

## 2023-09-27 ENCOUNTER — Ambulatory Visit: Payer: Self-pay | Admitting: Physical Therapy

## 2023-09-27 ENCOUNTER — Encounter: Payer: Self-pay | Admitting: Physical Therapy

## 2023-09-27 DIAGNOSIS — M6281 Muscle weakness (generalized): Secondary | ICD-10-CM

## 2023-09-27 DIAGNOSIS — M546 Pain in thoracic spine: Secondary | ICD-10-CM

## 2023-09-27 DIAGNOSIS — G8929 Other chronic pain: Secondary | ICD-10-CM

## 2023-09-27 NOTE — Therapy (Signed)
 OUTPATIENT PHYSICAL THERAPY DAILY NOTE/DISCHARGE NOTE PHYSICAL THERAPY DISCHARGE SUMMARY  Visits from Start of Care: 14  Current functional level related to goals / functional outcomes: As indicated below   Remaining deficits: Occassional reminders for correct posture with lifting.   Education / Equipment: I with HEP , will be continuing progressive strength with a personal trainer   Patient agrees to discharge. Patient goals were met. Patient is being discharged due to meeting the stated rehab goals. And being pleased with current functional level.    Patient Name: Tina Johnson MRN: [id number] DOB:12-28-1965, 58 y.o., female Today's Date: 09/27/2023  END OF SESSION:  PT End of Session - 09/27/23 0932     Visit Number 14    Number of Visits 14    Date for PT Re-Evaluation 10/20/23    Authorization Type CAFA    PT Start Time 0930    PT Stop Time 1015    PT Time Calculation (min) 45 min    Activity Tolerance Patient tolerated treatment well    Behavior During Therapy [facility] for tasks assessed/performed               Past Medical History:  Diagnosis Date   Abnormality of gait 04/26/2014   Anxiety    controls with exercise    Arthritis    knees , shoulders   Bradycardia 05/31/2017   DEPRESSION 08/14/2007   Qualifier: Diagnosis of  By: Zara Heymann     Family history of diabetes mellitus (DM) 01/23/2014   Genu varum of both lower extremities 03/07/2014   GERD 08/14/2007   Qualifier: Diagnosis of  By: Zara Heymann     HEADACHE, CHRONIC 08/14/2007   Qualifier: Diagnosis of  By: Zara Heymann     HYPERLIPIDEMIA 08/14/2007   Qualifier: Diagnosis of  By: Zara Heymann     Indigestion    no medicine at present   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   Neoplasm of appendix    OSTEOARTHRITIS 08/14/2007   Qualifier: Diagnosis of  By: Zara Heymann     Other bilateral secondary osteoarthritis of knee 04/03/2015   PONV  (postoperative nausea and vomiting) 1999   partial hysterectomy in [place]   Right knee DJD 01/23/2014   S/P TKR (total knee replacement), 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  Have gelport and Right hemicolectomy cart in the room.    LAPAROSCOPIC RIGHT HEMI COLECTOMY Right 09/26/2017   Procedure: LAPAROSCOPIC RIGHT HEMI COLECTOMY;  Surgeon: Alben Alma, MD;  Location: [facility] ORS;  Service: General;  Laterality: Right;   TOTAL ABDOMINAL HYSTERECTOMY W/ BILATERAL SALPINGOOPHORECTOMY  2001   done at [facility]   TOTAL KNEE ARTHROPLASTY Right 04/03/2015   Procedure: RIGHT TOTAL KNEE ARTHROPLASTY;  Surgeon: Wes Hamman, MD;  Location: [facility] OR;  Service: Orthopedics;  Laterality: Right;   TOTAL KNEE ARTHROPLASTY Left 04/03/2015   Procedure: LEFT TOTAL KNEE ARTHROPLASTY;  Surgeon: Wes Hamman, MD;  Location: [facility] OR;  Service: Orthopedics;  Laterality: Left;   VAGINAL DELIVERY     ?x2   Patient Active Problem List   Diagnosis Date  Noted   Essential hypertension 02/25/2020   Overweight 02/25/2020   S/P right colectomy 09/26/2017   Low grade mucinous neoplasm of appendix    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   Bradycardia 05/31/2017   S/P TKR (total knee replacement), bilateral 10/07/2016   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   Genu varum of both lower extremities 03/07/2014   Family history of diabetes mellitus (DM) 01/23/2014   Right knee DJD 01/23/2014   Hyperlipidemia 01/23/2014   HYPERLIPIDEMIA 08/14/2007   DEPRESSION 08/14/2007   GERD 08/14/2007   Headache 08/14/2007    PCP: Lawrance Presume, MD   REFERRING PROVIDER: Jude Norton, MD    REFERRING DIAG: M54.50,G89.29 (ICD-10-CM) - Chronic low back pain, unspecified back pain laterality, unspecified whether sciatica present   Rationale for Evaluation and Treatment: Rehabilitation  THERAPY DIAG:  Chronic left-sided low back pain with left-sided sciatica  Muscle weakness (generalized)  Pain in thoracic spine  ONSET DATE: onset 4 years ago but more recently exacerbating pain in last 2 months  SUBJECTIVE:                                                                                                                                                                                           SUBJECTIVE STATEMENT:  09-27-23  At the moment I am tired but when she exercises she feels better.  I feel more relaxed. I have been learning to lift better. I walk about 35 minutes 09/16/23: Pt reports that she is feeling a bit better and has had no pain over the last several days.    Pt tried to do some of the lifting yesterday but she couldn't maintain straight back like she should. Min pain today 2/10.  Pt expresses fearfulness of lifting and progressing exercises in gym.  Pt is nearing DC but expresses a need for reinforcing HEP and lifting techniques.    EVAL: Through interpreter, pt explains she has had pain for about 4 years but she did exercise and it helped her pain but in the last 2 months it has gotten worse and exercise don't help. After 2 hours of sitting pain begins.  Pain is worse when she is sitting  but walking is better for pain. Stand is OK but she is having more pain down her left side. She went the whole day mostly standing and last night when she was lying down and husband massaging her left lower leg the pain went away in her back. 4 years ago her back pain started with no incident.  More in low back and upper back  PERTINENT HISTORY:  OA bil knees, GERD,  hyperlipidemia, TKA bil for genu knee varus. Dec 2015 bil, appy,   PAIN:  Are you having pain? Yes: NPRS scale:  at rest 3/10 now and at worst 10/10  Pain location: thoracic  and low back Pain description: spasming achy pain Aggravating factors: lifting household items like appliances to make tamales  about 14-15 pounds, sometimes disrupts sleep Relieving factors: movement makes better  PRECAUTIONS: None  RED FLAGS: None   WEIGHT BEARING RESTRICTIONS: No  FALLS:  Has patient fallen in last 6 months? 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   PLOF: Independent  PATIENT GOALS: Feel better and be able to return to gym  NEXT MD VISIT: TBD  OBJECTIVE:  Note: Objective measures were completed at Evaluation unless otherwise noted.  DIAGNOSTIC FINDINGS:  2 view x-rays of the lumbar spine show lordosis of the lumbar spine, there  is some noted retrolisthesis of L4-L5.  Some noted spurring throughout the  lumbar spine and mild sclerotic changes.  Disc height does appear to be  narrower at L4-L5 and L1-L2.  Patient also noted to have some mild  thoracic disc height narrowing  See medical records for x rays  PATIENT SURVEYS:  Modified Oswestry 30%   09-08-23 ODI 14% COGNITION: Overall cognitive status: Within functional limits for tasks assessed     SENSATION: WFL  MUSCLE LENGTH: Hamstrings: slight tightness L > R   POSTURE: rounded shoulders, forward head, and pelvic level higher on left side  PALPATION: Pt with TTP over thoracic lumbar paraspinals  LUMBAR ROM:   [facility] eval 08/16/23 09-08-23 09-27-23  Flexion Fingertips to ankles * Finger tips to ankles, no back pain  Fingertips to floor with no pain Fingertips to floor with no pain  Extension 50% decrease *   75% 90%  Right lateral flexion [facility] WFL  Left lateral flexion [facility]  [facility] WFL  Right rotation 50% 75% 75% 90%  Left rotation 50% 75%  75% 90%  Key:  WFL = within functional limits not formally assessed, * = concordant pain, s = stiffness/stretching sensation, NT = not tested)  LOWER EXTREMITY ROM:     Active  Right eval Left eval R/L 09-08-23  Hip flexion 70standing 70 standing * WFL/WFL > 100 degrees  Hip extension     Hip abduction     Hip adduction     Hip internal rotation     Hip external rotation     Knee flexion   WFL/WFL  Knee extension     Ankle dorsiflexion     Ankle plantarflexion     Ankle inversion     Ankle eversion     (Blank rows = not tested, score listed is out of 5 possible points.  N = WNL, D = diminished, C = clear for gross weakness with myotome testing, * = concordant pain with testing)   LOWER EXTREMITY MMT:    MMT Right eval Left eval R/L 09-08-23 RL 09-27-23  Hip flexion 4+ 4+ 5/5 5/5  Hip extension 4 4 4+/4+ 4+/4+  Hip abduction 4 4- 4/4 4+/4+  Hip adduction      Hip internal rotation      Hip external rotation      Knee flexion 4+ 4+ 4+/4+ 5/5  Knee extension 4+ 4- 4+/4+ 5/5  Ankle dorsiflexion  Ankle plantarflexion      Ankle inversion      Ankle eversion       (Blank rows = not tested)  LUMBAR SPECIAL TESTS:  Straight leg raise test: Negative, Slump test: Negative, and FABER test: Negative But pt with limited ER bil FUNCTIONAL TESTS:  5 times sit to stand: 11.26 sec 2 minute walk test: TBD 08/09/23: 6 MWT :1527 feet no increased pain Plank frontal  39 sec  Sidelying plank 15 sec on right sidelying Sidelying plank 19 sec on left 2023-10-22  1790ft  (1500-1905 ft Norm)  Sidelying plank 60 sec on right sidelying Sidelying plank  on left 60 sec Frontal plank 71 sec    GAIT: Distance walked: 150 to clinic Assistive device utilized: None Level of assistance: Complete Independence Comments: Pt with slight antalgic gait disturbance  [facility] Adult PT Treatment:                                                DATE: Oct 22, 2023 Therapeutic Exercise: Dead bug- progression and  regression needs moderate cuing Bird dog Wall squat with ball x 10  HEP review and questions answered  Therapeutic Activity: 1720ft  (1500-1905 ft Norm) Working on set up, body position, lifting mechanics  30 to 50 # weights 30# 2 x 8 reps   50#  2 x 5 reps  with 90 sec rest between sets  Self Care: Use of trainer at community wellness/gym Progressive use of weights  [facility] Adult PT Treatment:                                                DATE: 09/21/23  Therapeutic Activity  Working on set up, body position, lifting mechanics for kitchen tasks involving heavy blender  Included moving 10# weight from counter to stool, simulating pouring into pot on floor, and then lifting pot to counter.  Pot simulated at 10# and ~15# Reviewing gym machines - knee ext, knee flexion, and leg press   [facility] Adult PT Treatment:                                                DATE: October 03, 2023 Therapeutic Exercise: Dead bug- progression and regression needs moderate cuing Dead bug with use of pilates ring  mod cuing Review questions on HEP  Therapeutic Activity: STS with 15 # KB 3 x 10 Deadlifting demo with 30# KB, Needs mod curing for correct lifting Deadlifting with 25 # training bar needs mod cuing for correct lifting  SELF CARE: educated on lifting principles and benefits of trainers post DC from PT for guided lifting and progression  [facility] Adult PT Treatment:                                                DATE: 08/29/23 Therapeutic Exercise: Recumbent bike L 1 for 5 min 2 pillows  Therapeutic Activity: Technique for lifting/pressing with leg press 1 plate x  10 x 2  Wall squat with ball x 10  Added an isometric hold  Bridging with ball  Lower ab with dumbbell 5# 90/90 hold  Knee ext Lat pull over  Dead bug- progression and regression Self Care: The role for and presenting back pain With modified exercises in a neutral core position Adding load with dumbbell maintaining neutral  position Time commitment for home program (was doing 1.5 hours)    08/25/23: Therapeutic Activity: Bike - 5 min - L2 Demonstration of common gym machines including knee flexion, knee ext, chest press, low and high row, hack squat.  Pt provided advise on form, ideal sets and reps, ideal weight, how to progress.   [facility] Adult PT Treatment:                                                DATE: 08-18-23 Therapeutic Exercise: Thoracic book opening side-lying on left and right 10 x 90/90 abdominal hold 10 sec x 5  LTR  Therapeutic Activity: Nustep L5 Ue/Le x 5 minutes  Hip hinge STS with   15 # KB x 10 Deadlift with 15 #  5 x 2 followed by standing extension stretch Deadlift  with 25 #  5 x 2  followed by standing extension stretch Suit case carry 15 # 50 feet x 4 alternating sides   Self Care: Self spinal mobs using double tennis ball over thoracic spine and Left shoulder blade between spine and scapula Return demo by PT   Lifting principles reinforced    HOME EXERCISE PROGRAM: Access Code: [email or web address] URL: [email or web address] Date: 08/02/2023 Prepared by: Lesleigh Rash  Exercises - Supine Pelvic Tilt  - 1 x daily - 7 x weekly - 3 sets - 10 reps - Supine Single Knee to Chest Stretch  - 2 x daily - 7 x weekly - 1 sets - 5 reps - 10 hold - Supine Lower Trunk Rotation  - 2 x daily - 7 x weekly - 1 sets - 5 reps - 20 hold - Supine Sciatic Nerve Glide  - 1 x daily - 7 x weekly - 3 sets - 10 reps - Supine Bridge  - 1 x daily - 7 x weekly - 3 sets - 10 reps - 2-3 seconds hold - Hooklying Clamshell with Resistance  - 1 x daily - 7 x weekly - 3 sets - 10 reps Added 08-04-23 - Supine March with Hands Behind Back  - 1 x daily - 7 x weekly - 3 sets - 10 reps - Sidelying Open Book Thoracic Rotation with Knee on Foam Roll  - 1 x daily - 7 x weekly - 2 sets - 10 reps 08/16/23 - Supine 90/90 Abdominal Bracing  - 1 x daily - 7 x weekly - 1 sets - 5 reps - 10 hold - Kettlebell  Deadlift  - 1 x daily - 7 x weekly - 1-2 sets - 10 reps added - Bent Over Single Arm Shoulder Row with Dumbbell  - 1 x daily - 7 x weekly - 3 sets - 10 reps - Standing Hip Hinge with Dowel  - 1 x daily - 7 x weekly - 3 sets - 10 reps - Goblet Squat with Kettlebell  - 1 x daily - 7 x weekly - 3 sets - 10 reps - Kettlebell Deadlift  - 1 x daily -  7 x weekly - 3 sets - 8 reps - Farmer's Carry with Kettlebells  - 1 x daily - 7 x weekly - 2 sets - 8 reps   ASSESSMENT:  CLINICAL IMPRESSION: 09-27-23 [place] enters clinic with 0/10 pain and interpreter educates on  progressive strength for last visit in PT clinic before DC to gym and personal trainer. Pt reviewed HEP and lifting techniques to 50# lift today in clinic.  Pt achieved all goals LTG and is able to side plank for 60 sec and front plank for 71 sec showing increased core strength from evaluation.  Pt is also able to walk WNL for person of her age norms 1757ft  (1500-1905 ft Norm).  ODI 14 %. Pt is ready for DC and agrees to DC today and to continue with a personal trainer for continued Progressive strengthening.   09/16/23: Pt reports no back pain over the last several days.  She has been avoiding cooking a particular meal involving moving a heavy blender and filling a pot on the floor so we worked on this today with feedback provided on body positioning, lifting mechanics, and positioning of blender and pot.  Also suggested she may want to get a platform so that she can perform the work on the counter so she does not have to lift the pot.  No increased pain reported.  Reviewed gym machines.  EVAL: Patient is a 58 y.o. female who was seen today for physical therapy evaluation and treatment for left sided low back pain with left radiculopathy and thoracic back pain. Pt formerly seen with bil TKA and benefited from PT.  Pt is routinely active and had controlled her 4 years of back pain with exercise but in last 2 months pt is unable to  alleviate pain with exercise alone.  Pt is motivated to exercise but has not been able to return to gym in 2 and 1/2 months. Pt will benefit from skilled PT to address impairments and ongoing intermittent pain  OBJECTIVE IMPAIRMENTS: decreased activity tolerance, decreased mobility, difficulty walking, decreased ROM, decreased strength, increased muscle spasms, improper body mechanics, postural dysfunction, obesity, and pain.   ACTIVITY LIMITATIONS: carrying, lifting, bending, sitting, standing, squatting, reach over head, and locomotion level  PARTICIPATION LIMITATIONS: meal prep, cleaning, laundry, driving, and community activity  PERSONAL FACTORS: OA bil knees, GERD, hyperlipidemia, TKA bil for genu knee varus. Dec 2015 bil, appy, are also affecting patient's functional outcome.   REHAB POTENTIAL: Good  CLINICAL DECISION MAKING: Evolving/moderate complexity  EVALUATION COMPLEXITY: Moderate   GOALS: Goals reviewed with patient? Yes  SHORT TERM GOALS: Target date: 08-16-23  Pt will be I with initial HEP Baseline:no knowledge 08-11-23 I was better with my exercises when I started reading them.  The videos are in English Goal status: MET for initial exercise supine  2.  Report pain decrease from 10/10 to 5/10 Baseline: 10/10 at worst 08-04-23  2/10 08-11-23  0/10 pain but only occasionally Goal status: MET 09-08-23  3.  Demonstrate understanding of neutral posture and be more conscious of position and posture throughout the day.  Baseline: limited knowledge 08-11-23 Goal status: MET  4.  Pt will be able to increase lumbar rotation and lumbar flexion in order to comfortable perform household chores Baseline: See [facility] chart 08/16/23: lumbar rotation improved  Goal status:MET 09-08-23    LONG TERM GOALS: Target date: 09-10-23  revised 10-20-23  Pt will be independent with advanced HEP Baseline: no knowledge Goal status: MET  2.  Pt will be able to perform planks for 30 seconds  without increasing pain Baseline: 10 sec max 09-08-23  frontal plank  45 sec but side planks about 15-19-sec 09-27-23  side plank 60 sec and front plank for 71 sec Goal status: MET  3.  Pt will use household cooking equipment  for making tamales about 15 #  Baseline: unable to lift any weight 08-23-23 able to lift 45# Goal status:MET  4.  Pt will improve ODI to at least 18% Baseline: eval 30% 09-08-23  14 % Goal status: MET  5.  Demonstrate and verbalize techniques to reduce the risk of re-injury including: lifting, posture, body mechanics.  Baseline: no knowledge 08-23-23 Reviewed and demonstrated Goal status: MET  6.  Pt will be able to perform within normal range for age [phone] ft) Baseline: NT at eval 08/09/23: 1527 feet  09-27-23 Goal status: MET  7.  Pt will be educated and return demo on gym equipment in clinic in order to transition to gym at DC  09-08-23  Pt needs moderate cuing on equipment  09-27-23  Pt has trainer at gym for DC and has tried equipment in gym/clinic  Goal status: MET  8. Pt will be able to perform sidelying planks for 45 sec with good form and no compensations.   09-08-23  15-19-sec  09-27-23  60 sec  Goal Status: MET  9. Pt will be able to lift with lift bar with 50 # in clinic in order to show good form for transition to gym without fear of lifting.   09-08-23  30# KB  09-27-23  Pt demo 30 #  and demos 50 # x 5 in clinic  Goal status: MET  PLAN:  PT FREQUENCY: 1-2x/week  PT DURATION: 6 weeks  PLANNED INTERVENTIONS: [id number]- PT Re-evaluation, 97110-Therapeutic exercises, 97530- Therapeutic activity, 97112- Neuromuscular re-education, 97535- Self Care, 54098- Manual therapy, [id number]- Gait training, [phone]- Electrical stimulation (manual), Patient/Family education, Balance training, Stair training, Taping, Dry Needling, Joint mobilization, Spinal mobilization, Cryotherapy, and Moist heat.  PLAN FOR NEXT SESSION: cont core and development of HEP, gym  ex.  , capacity to carry household equipment and return to gym for exercise    Sharlet Dawson, PT, ATRIC Certified Exercise Expert for the Aging Adult  09/27/23 11:30 AM Phone: [phone] Fax: [phone]

## 2023-10-04 ENCOUNTER — Encounter: Payer: Self-pay | Admitting: Physical Therapy

## 2023-10-25 ENCOUNTER — Ambulatory Visit (INDEPENDENT_AMBULATORY_CARE_PROVIDER_SITE_OTHER): Payer: Self-pay | Admitting: Orthopaedic Surgery

## 2023-10-25 DIAGNOSIS — M545 Low back pain, unspecified: Secondary | ICD-10-CM

## 2023-10-25 DIAGNOSIS — G8929 Other chronic pain: Secondary | ICD-10-CM

## 2023-10-25 DIAGNOSIS — M542 Cervicalgia: Secondary | ICD-10-CM

## 2023-10-25 NOTE — Progress Notes (Signed)
 Office Visit Note   Patient: Tina Johnson           Date of Birth: 1965-10-27           MRN: [id number] Visit Date: 10/25/2023              Requested by: Vicci Barnie NOVAK, MD [address] [place],  [place] [address] PCP: Vicci Barnie NOVAK, MD   Assessment & Plan: Visit Diagnoses:  1. Chronic low back pain, unspecified back pain laterality, unspecified whether sciatica present   2. Neck pain     Plan: History of Present Illness Tina Johnson is a 58 year old female who presents with persistent back pain following physical therapy.  She experiences persistent back pain, rated 5 out of 10, extending from the nape to the lower back. The pain is noticeable during the day but does not disturb her sleep. She is cautious with gym exercises, particularly with weight lifting, due to fear of injury. She suspects the pain may be related to her exercise routine but finds it manageable.  She is concerned about a 'hump' on her upper back and is uncertain about its progression. The upper back pain is described as tense and position-dependent. She has been performing exercises and physical therapy for her neck as well.  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 Possible hump on upper back related to arthritis. - Continue home exercises and physical therapy as tolerated. - Consider MRI if pain becomes unmanageable or if considering injections. - Educated on self-monitoring exercise intensity to avoid exacerbating pain. - Discuss with physical therapist regarding continuation or cessation of therapy based on comfort and progress.  Language barrier increased complexity of the visit  Follow-Up Instructions: No follow-ups on file.   Orders:  No orders of the defined types were placed in this encounter.  No orders of the defined types were placed in this encounter.      Procedures: No procedures performed   Clinical Data: No additional findings.   Subjective: Chief Complaint  Patient presents with   Other    Follow up low back pain    HPI  Review of Systems  Constitutional: Negative.   HENT: Negative.    Eyes: Negative.   Respiratory: Negative.    Cardiovascular: Negative.   Endocrine: Negative.   Musculoskeletal: Negative.   Neurological: Negative.   Hematological: Negative.   Psychiatric/Behavioral: Negative.    All other systems reviewed and are negative.    Objective: Vital Signs: There were no vitals taken for this visit.  Physical Exam Vitals and nursing note reviewed.  Constitutional:      Appearance: She is well-developed.  HENT:     Head: Atraumatic.     Nose: Nose normal.  Eyes:     Extraocular Movements: Extraocular movements intact.  Cardiovascular:     Pulses: Normal pulses.  Pulmonary:     Effort: Pulmonary effort is normal.  Abdominal:     Palpations: Abdomen is soft.  Musculoskeletal:     Cervical back: Neck supple.  Skin:    General: Skin is warm.     Capillary Refill: Capillary refill takes less than 2 seconds.  Neurological:     Mental Status: She is alert. Mental status is at baseline.  Psychiatric:        Behavior: Behavior normal.        Thought Content: Thought content normal.  Judgment: Judgment normal.     Ortho Exam  Specialty Comments:  No specialty comments available.  Imaging: No results found.   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   Genu varum of both lower extremities 03/07/2014   Family history of diabetes mellitus (DM) 01/23/2014    Right knee DJD 01/23/2014   Hyperlipidemia 01/23/2014   HYPERLIPIDEMIA 08/14/2007   DEPRESSION 08/14/2007   GERD 08/14/2007   Headache 08/14/2007   Past Medical History:  Diagnosis Date   Abnormality of gait 04/26/2014   Anxiety    controls with exercise    Arthritis    knees , shoulders   Bradycardia 05/31/2017   DEPRESSION 08/14/2007   Qualifier: Diagnosis of  By: Josphine Penny Alexandro Olivia     Family history of diabetes mellitus (DM) 01/23/2014   Genu varum of both lower extremities 03/07/2014   GERD 08/14/2007   Qualifier: Diagnosis of  By: Josphine Penny Alexandro Olivia     HEADACHE, CHRONIC 08/14/2007   Qualifier: Diagnosis of  By: Josphine Penny Alexandro Olivia     HYPERLIPIDEMIA 08/14/2007   Qualifier: Diagnosis of  By: Josphine Penny Alexandro Olivia     Indigestion    no medicine at present   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   Neoplasm of appendix    OSTEOARTHRITIS 08/14/2007   Qualifier: Diagnosis of  By: Josphine Penny Alexandro Olivia     Other bilateral secondary osteoarthritis of knee 04/03/2015   PONV (postoperative nausea and vomiting) 1999   partial hysterectomy in [place]   Right knee DJD 01/23/2014   S/P TKR (total knee replacement), bilateral 10/07/2016    Family History  Problem Relation Age of Onset   Diabetes Sister    Diabetes Brother    Diabetes Mother    Diabetes Sister    Diabetes Sister    Diabetes Brother    Diabetes Brother    Colon cancer Neg Hx    Cancer Neg Hx    Rectal cancer Neg Hx     Past Surgical History:  Procedure Laterality Date   ABDOMINAL HYSTERECTOMY     APPENDECTOMY     JOINT REPLACEMENT Bilateral 2016   [place]   LAPAROSCOPIC APPENDECTOMY N/A 05/02/2017   Procedure: LAPAROSCOPIC CECECTOMY;  Surgeon: Jordis Laneta FALCON, MD;  Location: [facility];  Service: General;  Laterality: N/A;  Please open Lap appy tray.  Have gelport and Right hemicolectomy cart in the room.    LAPAROSCOPIC RIGHT HEMI COLECTOMY Right 09/26/2017   Procedure: LAPAROSCOPIC RIGHT HEMI COLECTOMY;   Surgeon: Jordis Laneta FALCON, MD;  Location: [facility];  Service: General;  Laterality: Right;   TOTAL ABDOMINAL HYSTERECTOMY W/ BILATERAL SALPINGOOPHORECTOMY  2001   done at [facility]   TOTAL KNEE ARTHROPLASTY Right 04/03/2015   Procedure: RIGHT TOTAL KNEE ARTHROPLASTY;  Surgeon: Kay CHRISTELLA Cummins, MD;  Location: [facility];  Service: Orthopedics;  Laterality: Right;   TOTAL KNEE ARTHROPLASTY Left 04/03/2015   Procedure: LEFT TOTAL KNEE ARTHROPLASTY;  Surgeon: Kay CHRISTELLA Cummins, MD;  Location: [facility];  Service: Orthopedics;  Laterality: Left;   VAGINAL DELIVERY     ?x2   Social History   Occupational History  Not on file  Tobacco Use   Smoking status: Never   Smokeless tobacco: Never  Vaping Use   Vaping status: Never Used  Substance and Sexual Activity   Alcohol use: No   Drug use: No   Sexual activity: Not Currently

## 2023-11-10 ENCOUNTER — Other Ambulatory Visit: Payer: Self-pay

## 2024-01-26 ENCOUNTER — Other Ambulatory Visit: Payer: Self-pay

## 2024-01-27 ENCOUNTER — Telehealth: Payer: Self-pay | Admitting: Internal Medicine

## 2024-01-27 ENCOUNTER — Other Ambulatory Visit: Payer: Self-pay

## 2024-01-27 MED ORDER — ESTRADIOL 0.01 % VA CREA
TOPICAL_CREAM | VAGINAL | 12 refills | Status: DC
  Filled 2024-01-27: qty 42.5, fill #0
  Filled 2024-01-28: qty 42.5, 150d supply, fill #0
  Filled ????-??-??: fill #1

## 2024-01-27 NOTE — Telephone Encounter (Signed)
 Copied from CRM [id number]. Topic: Clinical - Medication Refill >> Jan 27, 2024  9:25 AM Turkey B wrote: Medication: estradiol  (ESTRACE ) 0.1 MG/GM vaginal cream  Has the patient contacted their pharmacy? iyes (Agent: If yes, when and what did the pharmacy advise?)contact pcp  This is the patient's preferred pharmacy:   [facility] [address], [address] [place] [address] Phone: [phone] Fax: [phone]   Is this the correct pharmacy for this prescription? yes  Has the prescription been filled recently? no  Is the patient out of the medication? yes  Has the patient been seen for an appointment in the last year OR does the patient have an upcoming appointment? yes  Can we respond through MyChart? yes  Agent: Please be advised that Rx refills may take up to 3 business days. We ask that you follow-up with your pharmacy.

## 2024-01-27 NOTE — Telephone Encounter (Signed)
 Called & spoke to the patient. Verified name & DOB. Informed of refill on vaginal cream to the pharmacy. Patient expressed verbal understanding of all discussed.

## 2024-01-27 NOTE — Telephone Encounter (Signed)
 Routing to PCP for review.

## 2024-01-27 NOTE — Telephone Encounter (Signed)
 Rf sent on vaginal cream.

## 2024-01-28 ENCOUNTER — Other Ambulatory Visit (HOSPITAL_COMMUNITY): Payer: Self-pay

## 2024-02-20 ENCOUNTER — Encounter: Payer: Self-pay | Admitting: Radiology

## 2024-03-01 ENCOUNTER — Ambulatory Visit: Payer: Self-pay | Admitting: Internal Medicine

## 2024-03-01 ENCOUNTER — Telehealth: Payer: Self-pay | Admitting: Internal Medicine

## 2024-03-01 NOTE — Telephone Encounter (Signed)
Confirmed appt for 11/14

## 2024-03-02 ENCOUNTER — Other Ambulatory Visit: Payer: Self-pay

## 2024-03-02 ENCOUNTER — Encounter: Payer: Self-pay | Admitting: Internal Medicine

## 2024-03-02 ENCOUNTER — Ambulatory Visit: Payer: Self-pay | Attending: Internal Medicine | Admitting: Internal Medicine

## 2024-03-02 VITALS — BP 163/80 | HR 49 | Temp 97.6°F | Ht <= 58 in | Wt 129.0 lb

## 2024-03-02 DIAGNOSIS — Z1231 Encounter for screening mammogram for malignant neoplasm of breast: Secondary | ICD-10-CM

## 2024-03-02 DIAGNOSIS — I10 Essential (primary) hypertension: Secondary | ICD-10-CM

## 2024-03-02 DIAGNOSIS — Z6832 Body mass index (BMI) 32.0-32.9, adult: Secondary | ICD-10-CM

## 2024-03-02 DIAGNOSIS — E669 Obesity, unspecified: Secondary | ICD-10-CM

## 2024-03-02 DIAGNOSIS — K0889 Other specified disorders of teeth and supporting structures: Secondary | ICD-10-CM

## 2024-03-02 DIAGNOSIS — E66811 Obesity, class 1: Secondary | ICD-10-CM

## 2024-03-02 DIAGNOSIS — Z23 Encounter for immunization: Secondary | ICD-10-CM

## 2024-03-02 DIAGNOSIS — Z8509 Personal history of malignant neoplasm of other digestive organs: Secondary | ICD-10-CM

## 2024-03-02 MED ORDER — AMLODIPINE BESYLATE 5 MG PO TABS
5.0000 mg | ORAL_TABLET | Freq: Every day | ORAL | 1 refills | Status: AC
  Filled 2024-03-02: qty 90, 90d supply, fill #0

## 2024-03-02 NOTE — Patient Instructions (Signed)
  VISIT SUMMARY: Today, you came in for a routine follow-up visit. We discussed your elevated blood pressure, weight management, dental issue, and the need for a repeat colonoscopy. You mentioned that your blood pressure readings at home have been slightly elevated, and you had a recent break from your exercise routine due to hand pain. We also talked about your cracked tooth and your concerns about financial assistance for dental care and a colonoscopy.  YOUR PLAN: -ESSENTIAL HYPERTENSION: Essential hypertension means high blood pressure without a known secondary cause. Your blood pressure was elevated today, likely due to stress. We have restarted your medication, amlodipine , and you should monitor your blood pressure at home twice a week, aiming for readings below 130/80 mmHg. Your prescription has been sent to the pharmacy.  -OBESITY, CLASS 1: Class 1 obesity means having a body mass index (BMI) between 30 and 34.9. Your weight has decreased from 134 to 129 pounds. Continue exercising three to four times a week and focus on portion control and avoiding sugary drinks and snacks to improve your dietary habits.  -DISORDER OF TOOTH, UPPER LEFT, LOOSE AND CRACKED: You have a loose and cracked tooth on the upper left side. We have submitted a referral to [facility] for an evaluation. You can continue seeing your previous dentist if you prefer, but be aware of the financial constraints.  -PERSONAL HISTORY OF MALIGNANT NEOPLASM OF COLON: A personal history of malignant neoplasm of the colon means you have had colon cancer in the past. You are due for a repeat colonoscopy, as your last one was in 2020. We will apply for financial assistance for this procedure and schedule it soon.  INSTRUCTIONS: Please monitor your blood pressure at home twice a week and aim for readings below 130/80 mmHg. Continue your exercise routine and focus on portion control and avoiding sugary drinks and snacks. Follow up  with [facility] for your tooth evaluation. We will apply for financial assistance and schedule your colonoscopy. If you have any questions or concerns, please contact our office.                      Contains text generated by Abridge.                                 Contains text generated by Abridge.

## 2024-03-02 NOTE — Progress Notes (Signed)
 Patient ID: Tina Johnson, female    DOB: July 29, 1965  MRN: [id number]  CC: Hypertension (HTN f/u. Raguel of tooth on L side - loose, partial crack/Yes to mammogram. Flu vax administered on 03/02/24 - C.A.)   Subjective: Tina Johnson is a 58 y.o. female who presents for chronic ds management. 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    AMN Language interpreter used during this encounter. #[id number]  Discussed the use of AI scribe software for clinical note transcription with the patient, who gave verbal consent to proceed.  History of Present Illness Tina Johnson is a 58 year old female with hypertension who presents for a routine follow-up visit.  Her blood pressure, previously well-controlled off med, is elevated today, which she attributes to stress from a car issue this morning. She monitors her blood pressure at home three times a week, with readings ranging from 125 to 140 systolic and 79 to 80 diastolic. She limits salt intake and exercises at the gym three to four times a week, although she had a 15-day break due to hand pain.  She has a loose and partially cracked tooth on the upper left side. She prefers to continue seeing her previous dentist due to the good care she received, despite not having the same financial assistance as before. She has an appointment scheduled with the dentist on the third of next month but lacks financial aid for the visit.  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 Her last colonoscopy was in 2020,  due for 5 yr survillence.    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      Cholecalciferol (VITAMIN D3) 2000 units TABS Take 2,000 Units by mouth daily.     estradiol  (ESTRACE ) 0.01 % CREA vaginal cream Apply 1 gram vaginally 2 times a week 42.5 g 12   Multiple Vitamin (MULTIVITAMIN WITH MINERALS) TABS tablet Take 1 tablet by mouth 2 (two) times daily.     OMEGA-3 FATTY ACIDS PO Take 1 capsule by mouth 2 (two) times daily.      tiZANidine  (ZANAFLEX ) 4 MG tablet Take 1 tablet (4 mg total) by mouth every 6 (six) hours as needed for muscle spasms. 30 tablet 0   pravastatin  (PRAVACHOL ) 20 MG tablet Take 0.5 tablets (10 mg total) by mouth daily. (Patient not taking: Reported on 03/02/2024) 90 tablet 3   No current facility-administered medications on file prior to visit.    Allergies  Allergen Reactions   Norco [Hydrocodone -Acetaminophen ] Shortness Of Breath   Latex Rash    Social History   Socioeconomic History   Marital status: Married  Spouse name: Not on file   Number of children: Not on file   Years of education: Not on file   Highest education level: Not on file  Occupational History   Not on file  Tobacco Use   Smoking status: Never   Smokeless tobacco: Never  Vaping Use   Vaping status: Never Used  Substance and Sexual Activity   Alcohol use: No   Drug use: No   Sexual activity: Not Currently  Other Topics Concern   Not on file  Social History Narrative   Not on file   Social Drivers of Health   Financial  Resource Strain: High Risk (01/31/2023)   Overall Financial Resource Strain (CARDIA)    Difficulty of Paying Living Expenses: Hard  Food Insecurity: No Food Insecurity (01/31/2023)   Hunger Vital Sign    Worried About Running Out of Food in the Last Year: Never true    Ran Out of Food in the Last Year: Never true  Transportation Needs: No Transportation Needs (01/31/2023)   PRAPARE - Administrator, Civil Service (Medical): No    Lack of Transportation (Non-Medical): No  Physical Activity: Inactive (01/31/2023)   Exercise Vital Sign    Days of Exercise per Week: 0 days    Minutes of Exercise per Session: 0 min  Stress: Stress Concern Present (01/31/2023)   [employer] of Occupational Health - Occupational Stress Questionnaire    Feeling of Stress : Rather much  Social Connections: Socially Integrated (01/31/2023)   Social Connection and Isolation Panel    Frequency of Communication with Friends and Family: Twice a week    Frequency of Social Gatherings with Friends and Family: Once a week    Attends Religious Services: 1 to 4 times per year    Active Member of [employer] or Organizations: Yes    Attends Engineer, Structural: More than 4 times per year    Marital Status: Married  Catering Manager Violence: Not At Risk (01/31/2023)   Humiliation, Afraid, Rape, and Kick questionnaire    Fear of Current or Ex-Partner: No    Emotionally Abused: No    Physically Abused: No    Sexually Abused: No    Family History  Problem Relation Age of Onset   Diabetes Sister    Diabetes Brother    Diabetes Mother    Diabetes Sister    Diabetes Sister    Diabetes Brother    Diabetes Brother    Colon cancer Neg Hx    Cancer Neg Hx    Rectal cancer Neg Hx     Past Surgical History:  Procedure Laterality Date   ABDOMINAL HYSTERECTOMY     APPENDECTOMY     JOINT REPLACEMENT Bilateral 2016   [place]   LAPAROSCOPIC APPENDECTOMY N/A 05/02/2017   Procedure:  LAPAROSCOPIC CECECTOMY;  Surgeon: Jordis Laneta FALCON, MD;  Location: [facility];  Service: General;  Laterality: N/A;  Please open Lap appy tray.  Have gelport and Right hemicolectomy cart in the room.    LAPAROSCOPIC RIGHT HEMI COLECTOMY Right 09/26/2017   Procedure: LAPAROSCOPIC RIGHT HEMI COLECTOMY;  Surgeon: Jordis Laneta FALCON, MD;  Location: [facility];  Service: General;  Laterality: Right;   TOTAL ABDOMINAL HYSTERECTOMY W/ BILATERAL SALPINGOOPHORECTOMY  2001   done at [facility]   TOTAL KNEE ARTHROPLASTY Right 04/03/2015   Procedure: RIGHT TOTAL KNEE ARTHROPLASTY;  Surgeon: Kay CHRISTELLA Cummins, MD;  Location: [facility];  Service: Orthopedics;  Laterality: Right;  TOTAL KNEE ARTHROPLASTY Left 04/03/2015   Procedure: LEFT TOTAL KNEE ARTHROPLASTY;  Surgeon: Kay CHRISTELLA Cummins, MD;  Location: [facility];  Service: Orthopedics;  Laterality: Left;   VAGINAL DELIVERY     ?x2    ROS: Review of Systems Negative except as stated above  PHYSICAL EXAM: BP (!) 163/80   Pulse (!) 49   Temp 97.6 F (36.4 C) (Oral)   Ht 4' 5 (1.346 m)   Wt 129 lb (58.5 kg)   SpO2 99%   BMI 32.29 kg/m   Wt Readings from Last 3 Encounters:  03/02/24 129 lb (58.5 kg)  08/30/23 126 lb 3.2 oz (57.2 kg)  04/21/23 133 lb 11.2 oz (60.6 kg)   ' Physical Exam  General appearance - alert, well appearing, older Hispanic female and in no distress Mental status - normal mood, behavior, speech, dress, motor activity, and thought processes Mouth - moist oral mucosa. 1st molar in LT upper jaw is mildly loose Chest - clear to auscultation, no wheezes, rales or rhonchi, symmetric air entry Heart - normal rate, regular rhythm, normal S1, S2, no murmurs, rubs, clicks or gallops Extremities - peripheral pulses normal, no pedal edema, no clubbing or cyanosis      Latest Ref Rng & Units 01/31/2023   12:12 PM 12/02/2020   10:02 AM 04/19/2020    2:43 AM  CMP  Glucose 70 - 99 mg/dL 93  894  855   BUN 6 - 24 mg/dL 16  14  19    Creatinine 0.57 -  1.00 mg/dL 9.38  9.39  9.43   Sodium 134 - 144 mmol/L 140  140  143   Potassium 3.5 - 5.2 mmol/L 4.9  4.7  3.6   Chloride 96 - 106 mmol/L 103  103  106   CO2 20 - 29 mmol/L 23  22  27    Calcium 8.7 - 10.2 mg/dL 9.6  9.7  9.3   Total Protein 6.0 - 8.5 g/dL 7.6  6.8    Total Bilirubin 0.0 - 1.2 mg/dL 0.5  0.4    Alkaline Phos 44 - 121 IU/L 90  82    AST 0 - 40 IU/L 25  27    ALT 0 - 32 IU/L 20  22     Lipid Panel     Component Value Date/Time   CHOL 239 (H) 01/31/2023 1212   TRIG 117 01/31/2023 1212   HDL 58 01/31/2023 1212   CHOLHDL 4.1 01/31/2023 1212   CHOLHDL 3.8 01/21/2016 0921   VLDL 20 01/21/2016 0921   LDLCALC 160 (H) 01/31/2023 1212    CBC    Component Value Date/Time   WBC 7.8 01/31/2023 1212   WBC 7.5 04/19/2020 0243   RBC 4.98 01/31/2023 1212   RBC 4.52 04/19/2020 0243   HGB 15.4 01/31/2023 1212   HCT 46.6 01/31/2023 1212   PLT 279 01/31/2023 1212   MCV 94 01/31/2023 1212   MCH 30.9 01/31/2023 1212   MCH 31.2 04/19/2020 0243   MCHC 33.0 01/31/2023 1212   MCHC 33.6 04/19/2020 0243   RDW 12.9 01/31/2023 1212   LYMPHSABS 2.5 12/02/2020 1002   MONOABS 0.4 09/30/2017 0457   EOSABS 0.1 12/02/2020 1002   BASOSABS 0.0 12/02/2020 1002    ASSESSMENT AND PLAN: 1. Essential hypertension (Primary) Repeat BP better but still not at goal. She is agreeable to restarting Norvasc  5 mg daily. Try to limit salt in foods Continue home BP monitoring with goal of <130/80 - amLODipine  (  NORVASC ) 5 MG tablet; Take 1 tablet (5 mg total) by mouth daily.  Dispense: 90 tablet; Refill: 1  2. Obesity (BMI 30.0-34.9) Down several lbs from 1 yr ago' Encourage her to resume exercise activity. Plans to go to gym Discussed and encouraged healthy eating habits  3. Loosening of tooth Pt to apply for OC - Ambulatory referral to Dentistry  4. Need for immunization against influenza - Flu vaccine trivalent PF, 6mos and older(Flulaval,Afluria,Fluarix,Fluzone)  5. Encounter for  screening mammogram for malignant neoplasm of breast - MM 3D SCREENING MAMMOGRAM BILATERAL BREAST; Future  6. History of malignant neoplasm of appendix - Ambulatory referral to Gastroenterology   Patient was given the opportunity to ask questions.  Patient verbalized understanding of the plan and was able to repeat key elements of the plan.   This documentation was completed using Paediatric nurse.  Any transcriptional errors are unintentional.  Orders Placed This Encounter  Procedures   MM 3D SCREENING MAMMOGRAM BILATERAL BREAST   Flu vaccine trivalent PF, 6mos and older(Flulaval,Afluria,Fluarix,Fluzone)   Ambulatory referral to Gastroenterology   Ambulatory referral to Dentistry     Requested Prescriptions   Signed Prescriptions Disp Refills   amLODipine  (NORVASC ) 5 MG tablet 90 tablet 1    Sig: Take 1 tablet (5 mg total) by mouth daily.    Return in about 4 months (around 06/30/2024).  Barnie Louder, MD, FACP

## 2024-03-29 ENCOUNTER — Ambulatory Visit: Payer: Self-pay

## 2024-03-29 ENCOUNTER — Emergency Department (HOSPITAL_COMMUNITY)
Admission: EM | Admit: 2024-03-29 | Discharge: 2024-03-30 | Disposition: A | Discharge status: Home / Self Care | Payer: Self-pay | Attending: Emergency Medicine | Admitting: Emergency Medicine

## 2024-03-29 ENCOUNTER — Encounter (HOSPITAL_COMMUNITY): Payer: Self-pay

## 2024-03-29 DIAGNOSIS — K30 Functional dyspepsia: Secondary | ICD-10-CM | POA: Insufficient documentation

## 2024-03-29 LAB — URINALYSIS, ROUTINE W REFLEX MICROSCOPIC
Bilirubin Urine: NEGATIVE
Glucose, UA: NEGATIVE mg/dL
Ketones, ur: NEGATIVE mg/dL
Leukocytes,Ua: NEGATIVE
Nitrite: NEGATIVE
Protein, ur: NEGATIVE mg/dL
Specific Gravity, Urine: 1.004 — ABNORMAL LOW (ref 1.005–1.030)
pH: 6 (ref 5.0–8.0)

## 2024-03-29 LAB — CBC
HCT: 47.1 % — ABNORMAL HIGH (ref 36.0–46.0)
Hemoglobin: 15.4 g/dL — ABNORMAL HIGH (ref 12.0–15.0)
MCH: 30.9 pg (ref 26.0–34.0)
MCHC: 32.7 g/dL (ref 30.0–36.0)
MCV: 94.6 fL (ref 80.0–100.0)
Platelets: 326 K/uL (ref 150–400)
RBC: 4.98 MIL/uL (ref 3.87–5.11)
RDW: 13.3 % (ref 11.5–15.5)
WBC: 8.4 K/uL (ref 4.0–10.5)
nRBC: 0 % (ref 0.0–0.2)

## 2024-03-29 LAB — COMPREHENSIVE METABOLIC PANEL WITH GFR
ALT: 30 U/L (ref 0–44)
AST: 32 U/L (ref 15–41)
Albumin: 3.9 g/dL (ref 3.5–5.0)
Alkaline Phosphatase: 66 U/L (ref 38–126)
Anion gap: 12 (ref 5–15)
BUN: 18 mg/dL (ref 6–20)
CO2: 26 mmol/L (ref 22–32)
Calcium: 9.5 mg/dL (ref 8.9–10.3)
Chloride: 101 mmol/L (ref 98–111)
Creatinine, Ser: 0.53 mg/dL (ref 0.44–1.00)
GFR, Estimated: >60 mL/min (ref >60)
Glucose, Bld: 105 mg/dL — ABNORMAL HIGH (ref 70–99)
Potassium: 4.2 mmol/L (ref 3.5–5.1)
Sodium: 139 mmol/L (ref 135–145)
Total Bilirubin: 0.6 mg/dL (ref 0.0–1.2)
Total Protein: 7.8 g/dL (ref 6.5–8.1)

## 2024-03-29 LAB — TROPONIN I (HIGH SENSITIVITY)
Troponin I (High Sensitivity): 4 ng/L (ref <18)
Troponin I (High Sensitivity): 4 ng/L (ref <18)

## 2024-03-29 LAB — LIPASE, BLOOD: Lipase: 74 U/L — ABNORMAL HIGH (ref 11–51)

## 2024-03-29 NOTE — Telephone Encounter (Signed)
 FYI Only or Action Required?: FYI only for provider: ED advised.  Patient was last seen in primary care on 03/02/2024 by Vicci Barnie NOVAK, MD.  Called Nurse Triage reporting Fatigue.  Symptoms began a week ago.  Interventions attempted: Nothing.  Symptoms are: gradually worsening.  Triage Disposition: Go to ED Now (or PCP Triage)  Patient/caregiver understands and will follow disposition?: Yes Reason for Disposition  Patient sounds very sick or weak to the triager  Answer Assessment - Initial Assessment Questions Call interpreted by: Juliana ID# [phone]. Patient was at GYN yesterday and BP and O2 levels were normal.   1. DESCRIPTION: Describe how you are feeling.     Reports feeling very tired, has to eat something to gain energy but often feels too tired to walk or do anything.   2. ONSET: When did these symptoms begin? (e.g., hours, days, weeks, months)     A week  3. CAUSE: What do you think is causing the weakness or fatigue? (e.g., not drinking enough fluids, medical problem, trouble sleeping)     Unsure  4. OTHER SYMPTOMS: Do you have any other symptoms? (e.g., chest pain, fever, cough, SOB, vomiting, diarrhea, bleeding, other areas of pain)     Indigestion, burning sensation in chest, inflammation in throat, feels like throat is swollen, heart is racing.  Protocols used: Weakness (Generalized) and Fatigue-A-AH, Chest Pain-A-AH  Copied from CRM [id number]. Topic: Clinical - Red Word Triage >> Mar 29, 2024 12:47 PM Dedra B wrote: [facility] that prompted transfer to Nurse Triage: Pt said she feels really tired and has no energy. She has a burning sensation in her chest and inflammation in her throat. Warm transfer to NT.   Call interpreted by: Juliana ID# [phone]

## 2024-03-29 NOTE — ED Triage Notes (Addendum)
 Quick triage note: limited assessment d/t pt primary language spanish. Pt c/o epigastric pain  x 1 week, reports constant in nature, but worse with eating. Reports burning in throat and acid reflux.

## 2024-03-29 NOTE — Telephone Encounter (Signed)
 Noted

## 2024-03-29 NOTE — ED Triage Notes (Addendum)
 See Quick Triage Note.  Additionally, the Pt reports low energy and knee pain along  w/ epigastric pain.  Sts eating makes her feel better, but sts it causes irritation or swelling in her throat.    Pt able to speak full sentences w/ clear voice.

## 2024-03-30 ENCOUNTER — Other Ambulatory Visit: Payer: Self-pay

## 2024-03-30 ENCOUNTER — Telehealth: Payer: Self-pay | Admitting: Internal Medicine

## 2024-03-30 MED ORDER — PANTOPRAZOLE SODIUM 20 MG PO TBEC
20.0000 mg | DELAYED_RELEASE_TABLET | Freq: Every day | ORAL | 0 refills | Status: DC
  Filled 2024-03-30: qty 30, 30d supply, fill #0

## 2024-03-30 MED ORDER — SUCRALFATE 1 G PO TABS
1.0000 g | ORAL_TABLET | Freq: Once | ORAL | Status: AC
  Administered 2024-03-30: 1 g via ORAL
  Filled 2024-03-30: qty 1

## 2024-03-30 MED ORDER — PANTOPRAZOLE SODIUM 40 MG PO TBEC
40.0000 mg | DELAYED_RELEASE_TABLET | Freq: Every day | ORAL | Status: DC
  Administered 2024-03-30: 40 mg via ORAL
  Filled 2024-03-30: qty 1

## 2024-03-30 MED ORDER — PANTOPRAZOLE SODIUM 40 MG PO TBEC: 40.0000 mg | DELAYED_RELEASE_TABLET | Freq: Every day | ORAL | Status: DC

## 2024-03-30 MED ORDER — SUCRALFATE 1 G PO TABS
1.0000 g | ORAL_TABLET | Freq: Three times a day (TID) | ORAL | 0 refills | Status: DC
  Filled 2024-03-30: qty 120, 30d supply, fill #0

## 2024-03-30 NOTE — Telephone Encounter (Signed)
 Called & spoke to the patient. Verified name & DOB. Informed that Dr.Johnson will need to review results with the patient and see her for a hospital follow-up. Offered to reschedule for a sooner appointment for 03/30/24. Patient confirmed appointment and will be seen to address labs and concerns. No further assistance needed at this time.

## 2024-03-30 NOTE — Telephone Encounter (Signed)
 Copied from CRM #[id number]. Topic: General - Other >> Mar 30, 2024 12:12 PM Zebedee SAUNDERS wrote:  Reason for CRM: Pt called for lab results for 03/29/2024 at ER. Please call pt at [phone].

## 2024-03-30 NOTE — ED Provider Notes (Signed)
 [facility] Emergency Department Provider Note MRN:  [id number]  Arrival date & time: 03/30/2024     Chief Complaint   Abdominal Pain   History of Present Illness   Tina Johnson is a 58 y.o. year-old female presents to the ED with chief complaint of indigestion with associated burning sensation, nausea, and feeling like she is going to be sick.  She reports associated fatigue.  States that she has been having the symptoms for a little over a week.  States that she was seen recently by her regular doctor, but did not have any blood work done and was told that should be seen back in about 4 months.  She states that she is due for an endoscopy or colonoscopy, but this has not been scheduled yet.  She denies any fevers or chills.  She denies any pain at present.  Denies any other associated symptoms.  History provided by patient. Professional Spanish interpreter used through [employer].  Review of Systems  Pertinent positive and negative review of systems noted in HPI.    Physical Exam   Vitals:   03/29/24 1853 03/29/24 2227  BP: (!) 155/70 (!) 169/81  Pulse: 62 (!) 58  Resp: 18 14  Temp: 98.4 F (36.9 C) 97.8 F (36.6 C)  SpO2: 98% 92%    CONSTITUTIONAL:  Non toxic-appearing, NAD NEURO:  Alert and oriented x 3, CN 3-12 grossly intact EYES:  eyes equal and reactive ENT/NECK:  Supple, no stridor  CARDIO:  normal rate, regular rhythm, appears well-perfused  PULM:  No respiratory distress, CTAB GI/GU:  non-distended, no focal abdominal tenderness MSK/SPINE:  No gross deformities, no edema, moves all extremities  SKIN:  no rash, , atraumatic   *Additional and/or pertinent findings included in MDM below  Diagnostic and Interventional Summary    EKG Interpretation Date/Time:    Ventricular Rate:    PR Interval:    QRS Duration:    QT Interval:    QTC Calculation:   R Axis:      Text Interpretation:         Labs Reviewed   LIPASE, BLOOD - Abnormal; Notable for the following components:      Result Value   Lipase 74 (*)    All other components within normal limits  COMPREHENSIVE METABOLIC PANEL WITH GFR - Abnormal; Notable for the following components:   Glucose, Bld 105 (*)    All other components within normal limits  CBC - Abnormal; Notable for the following components:   Hemoglobin 15.4 (*)    HCT 47.1 (*)    All other components within normal limits  URINALYSIS, ROUTINE W REFLEX MICROSCOPIC - Abnormal; Notable for the following components:   Color, Urine STRAW (*)    Specific Gravity, Urine 1.004 (*)    Hgb urine dipstick SMALL (*)    Bacteria, UA RARE (*)    All other components within normal limits  TROPONIN I (HIGH SENSITIVITY)  TROPONIN I (HIGH SENSITIVITY)    No orders to display    Medications  sucralfate (CARAFATE) tablet 1 g (has no administration in time range)  pantoprazole  (PROTONIX ) EC tablet 40 mg (has no administration in time range)     Procedures  /  Critical Care Procedures  ED Course and Medical Decision Making  I have reviewed the triage vital signs, the nursing notes, and pertinent available records from the EMR.  Social Determinants Affecting Complexity of Care: Patient has no clinically significant social determinants affecting  this chief complaint..   ED Course: Clinical Course as of 03/30/24 0453  Fri Mar 30, 2024  0445 Comprehensive metabolic panel(!) No significant electrolyte abnormality, normal LFTs, normal T. bili, no right upper quadrant tenderness, low suspicion for gallbladder etiology. [RB]  0446 CBC(!) No leukocytosis or anemia. [RB]  0446 Lipase, blood(!) Lipase is mildly elevated at 74, but patient does not have any focal epigastric abdominal tenderness.  She does not have any vomiting.  Her symptoms sound more like peptic ulcer versus GERD.  My suspicion for pancreatitis is low.  Given how well she looks, and lack of pain at present, I do not think  that we need to proceed with any advanced imaging tonight. [RB]  0447 Urinalysis, Routine w reflex microscopic -Urine, Clean Catch(!) Urinalysis is inconsistent with infection [RB]  0447 Troponin I (High Sensitivity) Initial troponin is 4, her symptoms do not sound like ACS. [RB]    Clinical Course User Index [RB] Vicky Charleston, PA-C    Medical Decision Making Patient here with indigestion, acid reflux symptoms and nausea.  She is not currently having any symptoms.  She has been having the symptoms for about the past week.  I have a low suspicion for pancreatitis given her lack of pain and no vomiting.  She looks very well.  I suspect that her symptoms are from acid reflux, possibly peptic ulcer disease.  Will treat with omeprazole and Carafate.  She states that she is working with her PCP to get in to GI.  I have encouraged her to continue this.  She is afebrile.  Vitals are stable.  Abdomen is soft and non tender.  I doubt surgical or acute abdomen.  We discussed following up with PCP as well as trying initial treatment.  If symptoms change or worsen, patient is advised to return.  All history, results, and treatment plan were delivered using Spanish interpreter.  Amount and/or Complexity of Data Reviewed Labs: ordered. Decision-making details documented in ED Course.  Risk Prescription drug management.         Consultants: No consultations were needed in caring for this patient.   Treatment and Plan: I considered admission due to patient's initial presentation, but after considering the examination and diagnostic results, patient will not require admission and can be discharged with outpatient follow-up.    Final Clinical Impressions(s) / ED Diagnoses     ICD-10-CM   1. Indigestion  K30       ED Discharge Orders          Ordered    pantoprazole  (PROTONIX ) 20 MG tablet  Daily        03/30/24 0451    sucralfate (CARAFATE) 1 g tablet  3 times daily with meals &  bedtime        03/30/24 0451              Discharge Instructions Discussed with and Provided to Patient:   Discharge Instructions   None      Vicky Charleston, PA-C 03/30/24 0453    Palumbo, April, MD 03/30/24 [phone]

## 2024-04-02 ENCOUNTER — Ambulatory Visit: Payer: Self-pay | Attending: Internal Medicine | Admitting: Internal Medicine

## 2024-04-02 ENCOUNTER — Encounter: Payer: Self-pay | Admitting: Internal Medicine

## 2024-04-02 VITALS — BP 122/77 | HR 62 | Temp 97.5°F | Ht <= 58 in | Wt 131.0 lb

## 2024-04-02 DIAGNOSIS — R079 Chest pain, unspecified: Secondary | ICD-10-CM

## 2024-04-02 DIAGNOSIS — K219 Gastro-esophageal reflux disease without esophagitis: Secondary | ICD-10-CM

## 2024-04-02 NOTE — Progress Notes (Signed)
 Patient ID: Tina Johnson, female    DOB: 11/28/1965  MRN: [id number]  CC: Follow-up (ER f/u. Catheryn of fatigue - difficulty walking /Review blood work from [facility] [employer] [facility] for paps to be done with PCP - requesting OBGYN change/Already received flu vax. )   Subjective: Tina Johnson is a 58 y.o. 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    [employer] Language interpreter used during this encounter. #[id number], Jose  Discussed the use of AI scribe software for clinical note transcription with the patient, who gave verbal consent to proceed.  History of Present Illness Tina Johnson is a 58 year old female who presents with chest burning and fatigue following a recent emergency room visit.  She experienced indigestion, heartburn, and fatigue, prompting an emergency room visit on March 30, 2024. Blood tests revealed normal blood cell count, kidney, and liver function. Her blood sugar was slightly elevated at 105, and urinalysis showed no urinary tract infection. Heart enzyme level was normal, but lipase was slightly elevated. She was discharged with pantoprazole  and sucralfate  for heartburn and acid reflux.  The chest burning occurs in the chest, particularly after consuming spicy or fatty foods. She reports that since starting pantoprazole , she feels better, but still experiences fatigue and chest burning, especially when she delays meals.  She experiences chest burning during cardio exercises, which resolves with rest. No chest burning during weightlifting.    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   Hyperlipidemia 01/23/2014   HYPERLIPIDEMIA 08/14/2007   DEPRESSION 08/14/2007   GERD 08/14/2007   Headache 08/14/2007     Medications Ordered Prior to Encounter[1]  Allergies[2]  Social History   Socioeconomic History   Marital status: Married    Spouse name: Not on file   Number of children: Not on file   Years of education: Not on file   Highest education level: Not on file  Occupational History   Not on file  Tobacco Use   Smoking status: Never   Smokeless tobacco: Never  Vaping Use   Vaping status: Never Used  Substance and Sexual Activity   Alcohol use: No   Drug use: No   Sexual activity: Not Currently  Other Topics Concern   Not on file  Social History Narrative   Not on file   Social Drivers of Health   Tobacco Use: Low Risk (03/29/2024)   Patient History    Smoking Tobacco Use: Never    Smokeless Tobacco Use: Never    Passive Exposure: Not on file  Financial Resource Strain: High Risk (01/31/2023)   Overall Financial Resource Strain (CARDIA)    Difficulty of Paying Living Expenses: Hard  Food Insecurity: No Food Insecurity (01/31/2023)   Hunger Vital Sign    Worried About Running Out of Food in the Last Year: Never true    Ran Out of Food in the Last Year: Never true  Transportation Needs:  No Transportation Needs (01/31/2023)   PRAPARE - Administrator, Civil Service (Medical): No    Lack of Transportation (Non-Medical): No  Physical Activity: Inactive (01/31/2023)   Exercise Vital Sign    Days of Exercise per Week: 0 days    Minutes of Exercise per Session: 0 min  Stress: Stress Concern Present (01/31/2023)   [employer] of Occupational Health - Occupational Stress Questionnaire    Feeling of Stress :  Rather much  Social Connections: Socially Integrated (01/31/2023)   Social Connection and Isolation Panel    Frequency of Communication with Friends and Family: Twice a week    Frequency of Social Gatherings with Friends and Family: Once a week    Attends Religious Services: 1 to 4 times per year    Active Member of [employer] or Organizations: Yes    Attends Banker Meetings: More than 4 times per year    Marital Status: Married  Catering Manager Violence: Not At Risk (01/31/2023)   Humiliation, Afraid, Rape, and Kick questionnaire    Fear of Current or Ex-Partner: No    Emotionally Abused: No    Physically Abused: No    Sexually Abused: No  Depression (PHQ2-9): Low Risk (03/02/2024)   Depression (PHQ2-9)    PHQ-2 Score: 0  Alcohol Screen: Low Risk (01/31/2023)   Alcohol Screen    Last Alcohol Screening Score (AUDIT): 0  Housing: Low Risk (01/31/2023)   Housing    Last Housing Risk Score: 0  Utilities: Not At Risk (01/31/2023)   AHC Utilities    Threatened with loss of utilities: No  Health Literacy: Inadequate Health Literacy (01/31/2023)   B1300 Health Literacy    Frequency of need for help with medical instructions: Sometimes    Family History  Problem Relation Age of Onset   Diabetes Sister    Diabetes Brother    Diabetes Mother    Diabetes Sister    Diabetes Sister    Diabetes Brother    Diabetes Brother    Colon cancer Neg Hx    Cancer Neg Hx    Rectal cancer Neg Hx     Past Surgical History:  Procedure Laterality Date   ABDOMINAL HYSTERECTOMY     APPENDECTOMY     JOINT REPLACEMENT Bilateral 2016   [place]   LAPAROSCOPIC APPENDECTOMY N/A 05/02/2017   Procedure: LAPAROSCOPIC CECECTOMY;  Surgeon: Jordis Laneta FALCON, MD;  Location: [facility] ORS;  Service: General;  Laterality: N/A;  Please open Lap appy tray.  Have gelport and Right hemicolectomy cart in the room.    LAPAROSCOPIC RIGHT HEMI COLECTOMY Right 09/26/2017   Procedure: LAPAROSCOPIC RIGHT HEMI  COLECTOMY;  Surgeon: Jordis Laneta FALCON, MD;  Location: [facility] ORS;  Service: General;  Laterality: Right;   TOTAL ABDOMINAL HYSTERECTOMY W/ BILATERAL SALPINGOOPHORECTOMY  2001   done at [facility] hosp   TOTAL KNEE ARTHROPLASTY Right 04/03/2015   Procedure: RIGHT TOTAL KNEE ARTHROPLASTY;  Surgeon: Kay CHRISTELLA Cummins, MD;  Location: [facility] OR;  Service: Orthopedics;  Laterality: Right;   TOTAL KNEE ARTHROPLASTY Left 04/03/2015   Procedure: LEFT TOTAL KNEE ARTHROPLASTY;  Surgeon: Kay CHRISTELLA Cummins, MD;  Location: [facility] OR;  Service: Orthopedics;  Laterality: Left;   VAGINAL DELIVERY     ?x2    ROS: Review of Systems Negative except as stated above  PHYSICAL EXAM: BP 122/77 (BP Location: Left Arm, Patient Position: Sitting, Cuff Size: Normal)   Pulse 62   Temp (!) 97.5 F (36.4 C) (Oral)  Ht 4' 5 (1.346 m)   Wt 131 lb (59.4 kg)   SpO2 97%   BMI 32.79 kg/m   Wt Readings from Last 3 Encounters:  04/02/24 131 lb (59.4 kg)  03/29/24 129 lb (58.5 kg)  03/02/24 129 lb (58.5 kg)    Physical Exam  General appearance - alert, well appearing, and in no distress Mental status - normal mood, behavior, speech, dress, motor activity, and thought processes  EKD from ER reviewed.    Latest Ref Rng & Units 03/29/2024    3:17 PM 01/31/2023   12:12 PM 12/02/2020   10:02 AM  CMP  Glucose 70 - 99 mg/dL 894  93  894   BUN 6 - 20 mg/dL 18  16  14    Creatinine 0.44 - 1.00 mg/dL 9.46  9.38  9.39   Sodium 135 - 145 mmol/L 139  140  140   Potassium 3.5 - 5.1 mmol/L 4.2  4.9  4.7   Chloride 98 - 111 mmol/L 101  103  103   CO2 22 - 32 mmol/L 26  23  22    Calcium 8.9 - 10.3 mg/dL 9.5  9.6  9.7   Total Protein 6.5 - 8.1 g/dL 7.8  7.6  6.8   Total Bilirubin 0.0 - 1.2 mg/dL 0.6  0.5  0.4   Alkaline Phos 38 - 126 U/L 66  90  82   AST 15 - 41 U/L 32  25  27   ALT 0 - 44 U/L 30  20  22     Lipid Panel     Component Value Date/Time   CHOL 239 (H) 01/31/2023 1212   TRIG 117 01/31/2023 1212   HDL 58 01/31/2023 1212    CHOLHDL 4.1 01/31/2023 1212   CHOLHDL 3.8 01/21/2016 0921   VLDL 20 01/21/2016 0921   LDLCALC 160 (H) 01/31/2023 1212    CBC    Component Value Date/Time   WBC 8.4 03/29/2024 1517   RBC 4.98 03/29/2024 1517   HGB 15.4 (H) 03/29/2024 1517   HGB 15.4 01/31/2023 1212   HCT 47.1 (H) 03/29/2024 1517   HCT 46.6 01/31/2023 1212   PLT 326 03/29/2024 1517   PLT 279 01/31/2023 1212   MCV 94.6 03/29/2024 1517   MCV 94 01/31/2023 1212   MCH 30.9 03/29/2024 1517   MCHC 32.7 03/29/2024 1517   RDW 13.3 03/29/2024 1517   RDW 12.9 01/31/2023 1212   LYMPHSABS 2.5 12/02/2020 1002   MONOABS 0.4 09/30/2017 0457   EOSABS 0.1 12/02/2020 1002   BASOSABS 0.0 12/02/2020 1002    ASSESSMENT AND PLAN: 1. Chest pain in adult (Primary) Most of her symptoms sound as though it is acid reflux.  However she also endorses burning in the upper chest between the breasts when she exercises.  I will refer her to cardiology. Stop cardio exercises until cardiology evaluation. - Ambulatory referral to Cardiology  2. GERD - Continue pantoprazole  once daily. - Avoid spicy foods, tomato-based products, and acidic juices. - Eat last meal 2-3 hours before lying down. - Sleep with head elevated. - Referred to cardiologist to rule out cardiac etiology. - Stop cardio exercises until cardiology evaluation.  Patient had a question about whether she needs Pap smear.  She has had total hysterectomy many years ago for noncancerous reason.  Advised that she no longer needs to have a Pap smear.  Patient was given the opportunity to ask questions.  Patient verbalized understanding of the plan and was able  to repeat key elements of the plan.   This documentation was completed using Paediatric nurse.  Any transcriptional errors are unintentional.  No orders of the defined types were placed in this encounter.    Requested Prescriptions    No prescriptions requested or ordered in this encounter    No  follow-ups on file.  Barnie Louder, MD, FACP     [1]  Current Outpatient Medications on File Prior to Visit  Medication Sig Dispense Refill   amLODipine  (NORVASC ) 5 MG tablet Take 1 tablet (5 mg total) by mouth daily. 90 tablet 1   CALCIUM PO Take 1 tablet by mouth 2 (two) times daily.      Cholecalciferol (VITAMIN D3) 2000 units TABS Take 2,000 Units by mouth daily.     estradiol  (ESTRACE ) 0.01 % CREA vaginal cream Apply 1 gram vaginally 2 times a week 42.5 g 12   Multiple Vitamin (MULTIVITAMIN WITH MINERALS) TABS tablet Take 1 tablet by mouth 2 (two) times daily.     OMEGA-3 FATTY ACIDS PO Take 1 capsule by mouth 2 (two) times daily.      pantoprazole  (PROTONIX ) 20 MG tablet Take 1 tablet (20 mg total) by mouth daily. 30 tablet 0   sucralfate  (CARAFATE ) 1 g tablet Take 1 tablet (1 g total) by mouth 4 (four) times daily -  with meals and at bedtime. 120 tablet 0   tiZANidine  (ZANAFLEX ) 4 MG tablet Take 1 tablet (4 mg total) by mouth every 6 (six) hours as needed for muscle spasms. 30 tablet 0   pravastatin  (PRAVACHOL ) 20 MG tablet Take 0.5 tablets (10 mg total) by mouth daily. (Patient not taking: Reported on 04/02/2024) 90 tablet 3   No current facility-administered medications on file prior to visit.  [2]  Allergies Allergen Reactions   Norco [Hydrocodone -Acetaminophen ] Shortness Of Breath   Latex Rash

## 2024-04-02 NOTE — Patient Instructions (Addendum)
 [facility] Address:  [address] [place], [place] [address] PH# [phone]    VISIT SUMMARY: You visited us  today due to ongoing chest burning and fatigue following a recent emergency room visit. Your symptoms include indigestion, heartburn, and fatigue, which have been somewhat relieved by pantoprazole , but you still experience chest burning, especially after eating certain foods or during cardio exercises.  YOUR PLAN: -GASTROESOPHAGEAL REFLUX DISEASE (GERD) WITH CHEST PAIN: GERD is a condition where stomach acid frequently flows back into the tube connecting your mouth and stomach, causing irritation. You should continue taking pantoprazole  once daily to manage your symptoms. Avoid spicy foods, tomato-based products, and acidic juices, and try to eat your last meal 2-3 hours before lying down. Additionally, sleep with your head elevated to help reduce symptoms. You have been referred to a cardiologist to rule out any heart-related causes of your chest pain. Please stop cardio exercises until you have been evaluated by the cardiologist.  INSTRUCTIONS: Please follow up with the cardiologist as soon as possible to rule out any cardiac issues. Continue taking pantoprazole  as prescribed and follow the dietary and lifestyle recommendations provided. Avoid cardio exercises until your cardiology evaluation is complete.                      Contains text generated by [employer].                                 Contains text generated by [employer].

## 2024-04-03 NOTE — Progress Notes (Unsigned)
 [facility]:    Date:  04/04/2024   ID:  Tina Johnson, DOB 12-05-1965, MRN [id number]  PCP:  Vicci Barnie NOVAK, MD  Cardiologist:  New - Dr. Jordan (DOD today)  Referring MD: Vicci Barnie NOVAK, MD   Chief Complaint: chest pain  History of Present Illness:    Tina Johnson is a 58 y.o. female with a history of bradycardia, hypertension, hyperlipidemia, GERD, and anxiety/ depression who presents today as a new patient in the [facility] for further evaluation of chest pain.   Patient was previously seen by Dr. Alveta in 07/2017 for mild asymptomatic sinus bradycardia. No additional work-up was felt to be necessary. She was advised to follow-up as needed at that time.  She was recently seen in the ED on 03/29/2024 for further evaluation of indigestion and nausea. EKG showed normal sinus rhythm, rate 62 bpm, with no acute ischemic changes. High-sensitivity troponin negative x2. Lipase was elevated at 74. Symptoms were felt to be due to GERD vs possible PUD. She was treated with PPI and Carafate  and advised to follow-up with her PCP. She was seen by her PCP on 04/02/2024 and most of her symptoms sounded like GERD but she also reported upper chest pain with exercise. Therefore, she was referred to Cardiology.  Patient is Spanish-speaking.  No in person interpreter was available so an iPad interpreter was used throughout the entire visit.  Even with this, it was difficult to get a detailed history.  However, the patient reports upper abdominal/chest pain with radiation up to her neck and occasionally to her arms over the last week.  She also describes a chest heaviness sensation after eating food but also with exertion such as walking.  She states the chest heaviness has been going on for about 2 months. The radiation to her arms occurs after eating not with walking.  She states the reflux medications that she was recently started on have helped a little.   Her larger concern is decreased energy and significant fatigue.  She states this has started impacting her activity. Some of her fatigue seems to be correlated with palpitations that she describes as heart racing which can last for 10 minutes at a time. However, sometimes her fatigue/ decreased energy improves after eating. She is monitoring her blood sugar at home. She reports some occasional mild dizziness but no near-syncope/syncope.  No shortness of breath, orthopnea, PND, or edema.    She does report history of GERD a gastric ulcer in the past.  She she reports sometimes she will notice a little blood on the toilet paper after walking fast and then having a bowel movement.  However, no other abnormal bleeding in urine or stools.  She has seen GI in the past and it sounds like she was recently referred back to them but is waiting to be called.  She denies any current or prior use of tobacco, alcohol, or drugs.  She denies any family history of cardiovascular disease including CAD, CHF, or stroke.  EKGs/Labs/Other Studies Reviewed:    The following studies were reviewed:  N/A  EKG:  EKG not ordered today.   Recent Labs: 03/29/2024: ALT 30; BUN 18; Creatinine, Ser 0.53; Hemoglobin 15.4; Platelets 326; Potassium 4.2; Sodium 139  Recent Lipid Panel    Component Value Date/Time   CHOL 239 (H) 01/31/2023 1212   TRIG 117 01/31/2023 1212   HDL 58 01/31/2023 1212   CHOLHDL 4.1 01/31/2023 1212   CHOLHDL 3.8 01/21/2016  9078   VLDL 20 01/21/2016 0921   LDLCALC 160 (H) 01/31/2023 1212    Physical Exam:    Vital Signs: BP 126/70   Pulse 70   Ht 4' 5 (1.346 m)   Wt 128 lb 9.6 oz (58.3 kg)   SpO2 98%   BMI 32.19 kg/m     Wt Readings from Last 3 Encounters:  04/04/24 128 lb 9.6 oz (58.3 kg)  04/02/24 131 lb (59.4 kg)  03/29/24 129 lb (58.5 kg)     General: 58 y.o. female in no acute distress. HEENT: Normocephalic and atraumatic. Sclera clear.  Neck: Supple. No carotid bruits. No  JVD. Heart: RRR. Distinct S1 and S2. No murmurs, gallops, or rubs.  Lungs: No increased work of breathing. Clear to ausculation bilaterally. No wheezes, rhonchi, or rales.  Abdomen: Soft, non-distended, and non-tender to palpation.  Extremities: No lower extremity edema.  Skin: Warm and dry. Neuro: No focal deficits. Psych: Normal affect. Responds appropriately.  Assessment:    1. Chest pain of uncertain etiology   2. Palpitations   3. History of bradycardia   4. Primary hypertension   5. Hyperlipidemia, unspecified hyperlipidemia type     Plan:    Chest Pain  Patient was recently seen in the ED last week for indigestion. EKG showed no acute ischemic changes and high-sensitivity troponin was negative x2. Lipase was mildly elevated. Symptoms were felt to be due to GERD and she was started on PPI and Carafate . She subsequently reported exertional chest pain at follow-up with PCP and was referred to Cardiology. - Some of her symptoms due sound they are GI in nature. However, she also reports exertional chest heaviness and decreased energy level/ fatigue.  - Initial plan was for a coronary CTA but then patient revealed that she did not have insurance. Will reach out to our social worker to discuss best options.  - Will get Echo if patient is able to afford this. - Also recommend she see GI. It sounds like she has already been referred to them and is just waiting to be called.  Palpitations Patient describes episodes of heart racing that seem to correlate with her decreased energy level/ fatigue. - Potassium was 4.2 during recent ED visit last week. Will check TSH and Magnesium. - Will try to get a 2 week Zio. Patient does not have insurance so advised her that she will need to try to get patient assistance through the company. If we are not able to do this, then will recommend [facility] device.  History of Bradycardia History of mild asymptomatic sinus bradycardia in the past.  -  Rates in the low 70s today.   Hypertension BP well controlled. - Continue Amlodipine  5mg  daily.   Hyperlipidemia Lipid panel in 01/2023: Total Cholesterol 239, Triglycerides 117, HDL 58, LDL 160. LDL <70 given iliac artery calcification noted on CT back in 2018. - Previously on Pravastatin  10mg  daily but is no longer on this.  - Will recheck lipid panel and then will plan to restart a statin (likely high-intensity).  ** Ipad interpretor Zina Calamity [phone]) was used throughout entire visit **  Disposition: Follow up in 3 months.   Signed, Aline FORBES Door, PA-C  04/04/2024 1:24 PM    [facility]  ADDENDUM 04/05/2024: Discussed with Social Worker Reggie [place]). Patient is currently self pay with approval for [facility] Financial Assistance through 07/2024. Therefore, recommendation was for patient to call the Financial Assistance team to see if this will also  cover coronary CTA. Will go ahead and order the coronary CTA. If Financial Assistance will not cover this, will treat medically for presumed CAD and likely add Imdur.   Moria Brophy E Rashaad Hallstrom, PA-C 04/05/2024 10:09 PM

## 2024-04-04 ENCOUNTER — Ambulatory Visit: Payer: Self-pay

## 2024-04-04 ENCOUNTER — Encounter: Payer: Self-pay | Admitting: Student

## 2024-04-04 ENCOUNTER — Telehealth (HOSPITAL_BASED_OUTPATIENT_CLINIC_OR_DEPARTMENT_OTHER): Payer: Self-pay | Admitting: Licensed Clinical Social Worker

## 2024-04-04 ENCOUNTER — Ambulatory Visit: Payer: Self-pay | Attending: Student | Admitting: Student

## 2024-04-04 VITALS — BP 126/70 | HR 70 | Ht <= 58 in | Wt 128.6 lb

## 2024-04-04 DIAGNOSIS — Z87898 Personal history of other specified conditions: Secondary | ICD-10-CM

## 2024-04-04 DIAGNOSIS — R072 Precordial pain: Secondary | ICD-10-CM

## 2024-04-04 DIAGNOSIS — R079 Chest pain, unspecified: Secondary | ICD-10-CM

## 2024-04-04 DIAGNOSIS — I10 Essential (primary) hypertension: Secondary | ICD-10-CM

## 2024-04-04 DIAGNOSIS — E785 Hyperlipidemia, unspecified: Secondary | ICD-10-CM

## 2024-04-04 DIAGNOSIS — R002 Palpitations: Secondary | ICD-10-CM

## 2024-04-04 NOTE — Patient Instructions (Signed)
 Medication Instructions:  Your physician has recommended you make the following change in your medication: Your physician recommends that you continue on your current medications as directed. Please refer to the Current Medication list given to you today.   *If you need a refill on your cardiac medications before your next appointment, please call your pharmacy*  Lab Work: IN THE NEXT FEW DAYS, COME BACK TO THE LAB, FASTING, FOR:  LIPID, MAG, & TSH  ESPANOL:  Anlisis de laboratorio: En los prximos das, regrese al laboratorio en ayunas para realizarse los siguientes anlisis: lpidos, magnesio y TSH.    Testing/Procedures: Your physician has requested that you have an echocardiogram. Echocardiography is a painless test that uses sound waves to create images of your heart. It provides your doctor with information about the size and shape of your heart and how well your hearts chambers and valves are working. This procedure takes approximately one hour. There are no restrictions for this procedure. Please do NOT wear cologne, perfume, aftershave, or lotions (deodorant is allowed). Please arrive 15 minutes prior to your appointment time.  Please note: We ask at that you not bring children with you during ultrasound (echo/ vascular) testing. Due to room size and safety concerns, children are not allowed in the ultrasound rooms during exams. Our front office staff cannot provide observation of children in our lobby area while testing is being conducted. An adult accompanying a patient to their appointment will only be allowed in the ultrasound room at the discretion of the ultrasound technician under special circumstances. We apologize for any inconvenience.   ESPANOL:  Pruebas/Procedimientos: Su mdico ha solicitado que se conservator, museum/gallery. La ecocardiografa es una prueba indolora que utiliza ondas sonoras para crear imgenes de su corazn. Proporciona a su mdico informacin  sobre el tamao y la forma de su corazn, as como sobre el funcionamiento de sus cavidades y vlvulas. Este procedimiento dura aproximadamente una hora. No hay restricciones para este procedimiento. Por favor, NO use colonia, perfume, locin para despus del afeitado ni cremas (se permite el desodorante). Por favor, llegue 15 minutos antes de la hora de su cita.  Tenga en cuenta: Le pedimos que no traiga nios a las pruebas de ultrasonido (engineering geologist). Debido al tamao de la sala y por motivos de seguridad, no se permite la entrada de nios a las salas de ultrasonido durante los exmenes. El personal de recepcin no puede supervisar a los nios en la sala de espera mientras se realizan las [place]. Solo se permitir la entrada de un adulto acompaante a la sala de ultrasonido a discrecin del tcnico de ultrasonido y en circunstancias especiales. Lamentamos cualquier inconveniente que esto pueda ocasionar    ZIO XT- Long Term Monitor Instructions  Your physician has requested you wear a ZIO patch monitor for 14 days.  This is a single patch monitor. [employer] supplies one patch monitor per enrollment. Additional stickers are not available. Please do not apply patch if you will be having a Nuclear Stress Test,  Echocardiogram, Cardiac CT, MRI, or Chest Xray during the period you would be wearing the  monitor. The patch cannot be worn during these tests. You cannot remove and re-apply the  ZIO XT patch monitor.  Your ZIO patch monitor will be mailed 3 day USPS to your address on file. It may take 3-5 days  to receive your monitor after you have been enrolled.  Once you have received your monitor, please review the enclosed instructions. Your monitor  has already been registered assigning a specific monitor serial # to you.  Billing and Patient Assistance Program Information  We have supplied [employer] with any of your insurance information on file for billing purposes. [employer] offers  a sliding scale Patient Assistance Program for patients that do not have  insurance, or whose insurance does not completely cover the cost of the ZIO monitor.  You must apply for the Patient Assistance Program to qualify for this discounted rate.  To apply, please call [employer] at [phone], select option 4, select option 2, ask to apply for  Patient Assistance Program. Meredeth will ask your household income, and how many people  are in your household. They will quote your out-of-pocket cost based on that information.  [employer] will also be able to set up a 72-month, interest-free payment plan if needed.  Applying the monitor   Shave hair from upper left chest.  Hold abrader disc by orange tab. Rub abrader in 40 strokes over the upper left chest as  indicated in your monitor instructions.  Clean area with 4 enclosed alcohol pads. Let dry.  Apply patch as indicated in monitor instructions. Patch will be placed under collarbone on left  side of chest with arrow pointing upward.  Rub patch adhesive wings for 2 minutes. Remove white label marked 1. Remove the white  label marked 2. Rub patch adhesive wings for 2 additional minutes.  While looking in a mirror, press and release button in center of patch. A small green light will  flash 3-4 times. This will be your only indicator that the monitor has been turned on.  Do not shower for the first 24 hours. You may shower after the first 24 hours.  Press the button if you feel a symptom. You will hear a small click. Record Date, Time and  Symptom in the Patient Logbook.  When you are ready to remove the patch, follow instructions on the last 2 pages of Patient  Logbook. Stick patch monitor onto the last page of Patient Logbook.  Place Patient Logbook in the blue and white box. Use locking tab on box and tape box closed  securely. The blue and white box has prepaid postage on it. Please place it in the mailbox as  soon as possible. Your  physician should have your test results approximately 7 days after the  monitor has been mailed back to [facility].  Call [facility] Customer Care at [phone] if you have questions regarding  your ZIO XT patch monitor. Call them immediately if you see an orange light blinking on your  monitor.  If your monitor falls off in less than 4 days, contact our Monitor department at [phone].  If your monitor becomes loose or falls off after 4 days call [employer] at [phone] for  suggestions on securing your monitor Ya se ha registrado y se le ha asignado un nmero de serie de monitor especfico.    ESPANOL: Instrucciones del monitor ZIO XT para monitorizacin a equities trader plazo  Su mdico le ha solicitado que utilice un monitor de parche ZIO durante 14 das. Este es un monitor de un solo uso. [employer] proporciona un monitor de parche por paciente. No hay parches adicionales disponibles. No se aplique el parche si se someter a una prueba de esfuerzo nuclear, un ecocardiograma, una tomografa computarizada cardaca, una resonancia magntica o una radiografa de trax durante el perodo en que deba usar el monitor. El parche no se puede usar durante [address] No puede  quitarse y programme researcher, broadcasting/film/video a secondary school teacher monitor de parche ZIO XT. Su monitor de parche ZIO se le enviar por correo postal (servicio de 3 das de [place]) a la direccin que consta en nuestros registros. Puede tardar de 3 a 5 das en recibir su monitor despus de haberse registrado. Una vez que reciba su monitor, revise las instrucciones adjuntas. Su monitor  Informacin sobre facturacin y [place] de asistencia al paciente  Hemos proporcionado a [employer] toda la informacin de su seguro mdico que tenemos registrada para fines de doctor, general practice. [employer] ofrece un programa de asistencia al paciente con tarifas ajustadas segn los ingresos para aquellos pacientes que no tienen seguro mdico o cuyo seguro no cubre completamente el  costo del monitor ZIO. Debe solicitar el Programa de Asistencia al Paciente para calificar para esta tarifa con descuento. Para solicitarlo, llame a [employer] al [phone], seleccione la opcin 4, luego la opcin 2 y solicite informacin sobre el Programa de Asistencia al [place]. [employer] le preguntar sobre sus ingresos familiares y el nmero de personas que viven en su hogar. Le informarn sobre el costo que deber pagar de su bolsillo segn esta informacin. [employer] tambin puede ofrecerle un plan de pago a 12 meses sin intereses, si lo necesita.  Colocacin del monitor   Afeite el vello de la parte superior izquierda del pecho. Sujete el disco abrasivo por la pestaa naranja. Frote el disco engineer, materials con 40 movimientos sobre la parte superior izquierda del pecho, como se indica en las instrucciones del monitor. Limpie la zona con las 4 toallitas de alcohol incluidas. Deje secar. Aplique el parche como se indica en las instrucciones del monitor. El parche se colocar debajo de la clavcula, en el lado izquierdo del [place], con la flecha apuntando [place] arriba. Frote las alas [place] del parche durante 2 minutos. Retire la etiqueta blanca marcada con el nmero 1. Retire la etiqueta blanca marcada con el nmero 2. Frote las alas [place] del parche durante 2 minutos adicionales. Mirndose en un espejo, presione y suelte el botn en el centro del parche. Una pequea luz verde parpadear de 3 a 4 veces. Esta ser la nica indicacin de que el monitor se ha encendido. No se duche durante las primeras 24 horas. Puede ducharse despus de las primeras 24 horas. Presione el botn si experimenta algn sntoma. Escuchar un pequeo clic. Registre la fecha, la hora y el sntoma en el diario del [place].  Cuando est listo para corporate treasurer, siga las instrucciones de [address] ltimas pginas del [place] de registro del [place]. Pegue el monitor del parche en la ltima pgina del [place] de  registro del [place]. Coloque el [place] de registro del paciente en la caja azul y blanca. Use la pestaa de cierre de la caja y cirrela bien con cinta adhesiva. La caja azul y blanca tiene franqueo prepagado. Por favor, depostela en el buzn lo antes posible. Su mdico recibir los [place] de la prueba aproximadamente 7 [employer] de que el monitor haya sido enviado de vuelta a [place]. Llame al [place] de [place] al [employer] al [phone] si tiene preguntas sobre su monitor de parche ZIO XT. Llmelos inmediatamente si ve una luz naranja parpadeando en su monitor. Si el monitor se cae antes de los [address], comunquese con nuestro departamento de Monitores al [phone]. Si el monitor se afloja o se cae despus de [address], llame a [place] al [phone] para obtener sugerencias sobre cmo asegurar su monitor. Ya se ha registrado  y se le ha asignado un nmero de serie de monitor especfico.   Follow-Up: At [facility], you and your health needs are our priority.  As part of our continuing mission to provide you with exceptional heart care, our providers are all part of one team.  This team includes your primary Cardiologist (physician) and Advanced Practice Providers or APPs (Physician Assistants and Nurse Practitioners) who all work together to provide you with the care you need, when you need it.  Your next appointment:   3 month(s)  Provider:   Peter Jordan, MD    We recommend signing up for the patient portal called MyChart.  Sign up information is provided on this After Visit Summary.  MyChart is used to connect with patients for Virtual Visits (Telemedicine).  Patients are able to view lab/test results, encounter notes, upcoming appointments, etc.  Non-urgent messages can be sent to your provider as well.   To learn more about what you can do with MyChart, go to [email or web address].   Other Instructions

## 2024-04-04 NOTE — Progress Notes (Unsigned)
 Enrolled for [employer] to mail a ZIO XT long term holter monitor to the patients address on file.   Spanish instructions requested.  Dr. Jordan to read.  [id number] mailed to patient and applied in office.

## 2024-04-04 NOTE — Telephone Encounter (Signed)
 H&V Care Navigation CSW Progress Note  Clinical Social Worker received a message from Callie, [place], to share pt needs CCTA, she currently is self pay with approval for [employer] Assistance through 07/2024. LCSW shared that pt should call [phone] and speak with them to see if this procedure may be covered under discount program. Also added billing department to see if estimate or any other guidance may be provided.   Patient is participating in a Managed Medicaid Plan:  no, self pay only, CAFA 75%  SDOH Screenings   Food Insecurity: No Food Insecurity (01/31/2023)  Housing: Low Risk (01/31/2023)  Transportation Needs: No Transportation Needs (01/31/2023)  Utilities: Not At Risk (01/31/2023)  Alcohol Screen: Low Risk (01/31/2023)  Depression (PHQ2-9): Low Risk (03/02/2024)  Financial Resource Strain: High Risk (01/31/2023)  Physical Activity: Inactive (01/31/2023)  Social Connections: Socially Integrated (01/31/2023)  Stress: Stress Concern Present (01/31/2023)  Tobacco Use: Low Risk (04/04/2024)  Health Literacy: Inadequate Health Literacy (01/31/2023)    Marit Lark, MSW, LCSW Clinical Social Worker II [facility] Heart/Vascular Care Navigation  [phone]- work cell phone (preferred)

## 2024-04-05 ENCOUNTER — Telehealth: Payer: Self-pay | Admitting: Licensed Clinical Social Worker

## 2024-04-05 NOTE — Addendum Note (Signed)
 Addended by: Yukari Flax on: 04/05/2024 10:10 PM   Modules accepted: Orders

## 2024-04-06 ENCOUNTER — Ambulatory Visit: Payer: Self-pay | Admitting: Student

## 2024-04-06 LAB — LIPID PANEL
Chol/HDL Ratio: 4.2 ratio (ref 0.0–4.4)
Cholesterol, Total: 232 mg/dL — ABNORMAL HIGH (ref 100–199)
HDL: 55 mg/dL (ref >39)
LDL Chol Calc (NIH): 158 mg/dL — ABNORMAL HIGH (ref 0–99)
Triglycerides: 108 mg/dL (ref 0–149)
VLDL Cholesterol Cal: 19 mg/dL (ref 5–40)

## 2024-04-06 LAB — TSH: TSH: 4.36 u[IU]/mL (ref 0.450–4.500)

## 2024-04-06 LAB — MAGNESIUM: Magnesium: 2.3 mg/dL (ref 1.6–2.3)

## 2024-04-06 NOTE — Telephone Encounter (Signed)
 H&V Care Navigation CSW Progress Note  Clinical Social Worker contacted patient by phone to f/u on question regarding pt assistance with echo. Was able to reach her with Spanish language interpreter [place], [phone]. Introduced self, role, reason for call. Explained how to contact billing regarding discount before her appt and then reminded her to bring letter also for them to scan in at check in. Pt appreciative of call, has letter in her purse for visit. No additional questions at this time.  Patient is participating in a Managed Medicaid Plan:  No, CAFA 100%  SDOH Screenings   Food Insecurity: No Food Insecurity (01/31/2023)  Housing: Low Risk (01/31/2023)  Transportation Needs: No Transportation Needs (01/31/2023)  Utilities: Not At Risk (01/31/2023)  Alcohol Screen: Low Risk (01/31/2023)  Depression (PHQ2-9): Low Risk (03/02/2024)  Financial Resource Strain: High Risk (01/31/2023)  Physical Activity: Inactive (01/31/2023)  Social Connections: Socially Integrated (01/31/2023)  Stress: Stress Concern Present (01/31/2023)  Tobacco Use: Low Risk (04/04/2024)  Health Literacy: Inadequate Health Literacy (01/31/2023)    Marit Lark, MSW, LCSW Clinical Social Worker II [facility] Health Heart/Vascular Care Navigation  [phone]- work cell phone (preferred)

## 2024-04-09 ENCOUNTER — Telehealth: Payer: Self-pay

## 2024-04-09 ENCOUNTER — Other Ambulatory Visit: Payer: Self-pay

## 2024-04-09 DIAGNOSIS — E785 Hyperlipidemia, unspecified: Secondary | ICD-10-CM

## 2024-04-09 DIAGNOSIS — I10 Essential (primary) hypertension: Secondary | ICD-10-CM

## 2024-04-09 MED ORDER — ATORVASTATIN CALCIUM 40 MG PO TABS
40.0000 mg | ORAL_TABLET | Freq: Every day | ORAL | 3 refills | Status: DC
  Filled 2024-04-09: qty 30, 30d supply, fill #0

## 2024-04-09 NOTE — Telephone Encounter (Signed)
 Son Larkin) returned staff call regarding patient test results.

## 2024-04-09 NOTE — Telephone Encounter (Signed)
 Spoke with patient's son regarding labs. He understand that his mother is to begin Lipitor 40 mg every other day. Patient also understand that FLP and LFT's in 3 months.  Medication has been set too preferred pharmacy

## 2024-04-09 NOTE — Progress Notes (Signed)
 Left vm using spanish interpreter [phone]

## 2024-04-09 NOTE — Progress Notes (Signed)
 Spoke to son, verbalized understanding.

## 2024-04-11 ENCOUNTER — Ambulatory Visit (HOSPITAL_COMMUNITY)
Admission: RE | Admit: 2024-04-11 | Discharge: 2024-04-11 | Disposition: A | Discharge status: Home / Self Care | Payer: Self-pay | Source: Ambulatory Visit | Attending: Cardiovascular Disease | Admitting: Cardiovascular Disease

## 2024-04-11 DIAGNOSIS — Z87898 Personal history of other specified conditions: Secondary | ICD-10-CM | POA: Insufficient documentation

## 2024-04-11 DIAGNOSIS — I10 Essential (primary) hypertension: Secondary | ICD-10-CM | POA: Insufficient documentation

## 2024-04-11 DIAGNOSIS — R600 Localized edema: Secondary | ICD-10-CM

## 2024-04-11 DIAGNOSIS — R0602 Shortness of breath: Secondary | ICD-10-CM

## 2024-04-11 DIAGNOSIS — R079 Chest pain, unspecified: Secondary | ICD-10-CM | POA: Insufficient documentation

## 2024-04-11 DIAGNOSIS — R072 Precordial pain: Secondary | ICD-10-CM

## 2024-04-11 DIAGNOSIS — E785 Hyperlipidemia, unspecified: Secondary | ICD-10-CM | POA: Insufficient documentation

## 2024-04-13 ENCOUNTER — Inpatient Hospital Stay: Payer: Self-pay | Admitting: Family Medicine

## 2024-04-14 LAB — ECHOCARDIOGRAM COMPLETE
AR max vel: 1.71 cm2
AV Area VTI: 2.3 cm2
AV Area mean vel: 2.13 cm2
AV Mean grad: 4 mmHg
AV Peak grad: 11.7 mmHg
Ao pk vel: 1.71 m/s
Area-P 1/2: 2.97 cm2
S' Lateral: 1.95 cm

## 2024-04-20 ENCOUNTER — Encounter (HOSPITAL_COMMUNITY): Payer: Self-pay

## 2024-04-23 ENCOUNTER — Other Ambulatory Visit: Payer: Self-pay

## 2024-04-23 ENCOUNTER — Other Ambulatory Visit: Payer: Self-pay | Admitting: Internal Medicine

## 2024-04-23 ENCOUNTER — Ambulatory Visit (HOSPITAL_BASED_OUTPATIENT_CLINIC_OR_DEPARTMENT_OTHER)
Admission: RE | Admit: 2024-04-23 | Discharge: 2024-04-23 | Disposition: A | Discharge status: Home / Self Care | Payer: Self-pay | Source: Ambulatory Visit | Attending: Cardiology | Admitting: Cardiology

## 2024-04-23 ENCOUNTER — Other Ambulatory Visit: Payer: Self-pay | Admitting: Cardiology

## 2024-04-23 ENCOUNTER — Ambulatory Visit (HOSPITAL_COMMUNITY)
Admission: RE | Admit: 2024-04-23 | Discharge: 2024-04-23 | Disposition: A | Discharge status: Home / Self Care | Payer: Self-pay | Source: Ambulatory Visit | Attending: Student | Admitting: Student

## 2024-04-23 DIAGNOSIS — R079 Chest pain, unspecified: Secondary | ICD-10-CM | POA: Insufficient documentation

## 2024-04-23 DIAGNOSIS — R931 Abnormal findings on diagnostic imaging of heart and coronary circulation: Secondary | ICD-10-CM

## 2024-04-23 MED ORDER — IOHEXOL 350 MG/ML SOLN
100.0000 mL | Freq: Once | INTRAVENOUS | Status: AC | PRN
  Administered 2024-04-23: 100 mL via INTRAVENOUS

## 2024-04-23 MED ORDER — NITROGLYCERIN 0.4 MG SL SUBL
0.8000 mg | SUBLINGUAL_TABLET | Freq: Once | SUBLINGUAL | Status: AC
  Administered 2024-04-23: 0.8 mg via SUBLINGUAL

## 2024-04-24 MED ORDER — PANTOPRAZOLE SODIUM 20 MG PO TBEC
20.0000 mg | DELAYED_RELEASE_TABLET | Freq: Every day | ORAL | 1 refills | Status: AC
  Filled 2024-04-24: qty 90, 90d supply, fill #0

## 2024-04-24 NOTE — Telephone Encounter (Signed)
 Requested medications are due for refill today.  yes  Requested medications are on the active medications list.  yes  Last refill. 03/30/2024 #30 0 rf  Future visit scheduled.   yes  Notes to clinic.  Rx signed by Lamar Schlossman    Requested Prescriptions  Pending Prescriptions Disp Refills   pantoprazole  (PROTONIX ) 20 MG tablet 30 tablet 0    Sig: Take 1 tablet (20 mg total) by mouth daily.     Gastroenterology: Proton Pump Inhibitors Passed - 04/24/2024  4:01 PM      Passed - Valid encounter within last 12 months    Recent Outpatient Visits           3 weeks ago Chest pain in adult   [facility] - A Dept Of [place]. [facility] Vicci Barnie NOVAK, MD   1 month ago Essential hypertension   [place] [facility] [place] - A Dept Of [place]. [facility] Vicci Barnie B, MD   7 months ago Obesity (BMI 30.0-34.9)   [place] [facility] - A Dept Of [place]. [facility] Vicci Barnie NOVAK, MD   1 year ago Annual physical exam   [place] [facility] [place] - A Dept Of [place]. [facility] Vicci Barnie NOVAK, MD   3 years ago Essential hypertension   [place] [facility] [place] - A Dept Of [place]. [facility] Theotis Haze ORN, NP       Future Appointments             In 2 months Jordan, Maude HERO, MD [facility] at [facility] A Dept of [employer]. [facility] [employer], H&V

## 2024-04-25 ENCOUNTER — Other Ambulatory Visit: Payer: Self-pay

## 2024-04-30 ENCOUNTER — Other Ambulatory Visit: Payer: Self-pay | Admitting: Internal Medicine

## 2024-04-30 NOTE — Telephone Encounter (Signed)
 Copied from CRM #[id number]. Topic: Clinical - Medication Refill >> Apr 30, 2024  4:08 PM Avram MATSU wrote: Medication:  pantoprazole  (PROTONIX ) 20 MG tablet [[id number]]  sucralfate  (CARAFATE ) 1 g tablet [[id number]]   Stated if needed she feels better   Has the patient contacted their pharmacy? No (Agent: If no, request that the patient contact the pharmacy for the refill. If patient does not wish to contact the pharmacy document the reason why and proceed with request.) (Agent: If yes, when and what did the pharmacy advise?)  This is the patient's preferred pharmacy:    [facility] [address] [place] [address] Phone: [phone] Fax: [phone]  Is this the correct pharmacy for this prescription? Yes If no, delete pharmacy and type the correct one.   Has the prescription been filled recently? No  Is the patient out of the medication? No  Has the patient been seen for an appointment in the last year OR does the patient have an upcoming appointment? Yes  Can we respond through MyChart? Yes  Agent: Please be advised that Rx refills may take up to 3 business days. We ask that you follow-up with your pharmacy.

## 2024-05-01 ENCOUNTER — Other Ambulatory Visit: Payer: Self-pay

## 2024-05-01 DIAGNOSIS — R079 Chest pain, unspecified: Secondary | ICD-10-CM

## 2024-05-01 DIAGNOSIS — R072 Precordial pain: Secondary | ICD-10-CM

## 2024-05-01 DIAGNOSIS — E785 Hyperlipidemia, unspecified: Secondary | ICD-10-CM

## 2024-05-01 DIAGNOSIS — Z87898 Personal history of other specified conditions: Secondary | ICD-10-CM

## 2024-05-01 DIAGNOSIS — I10 Essential (primary) hypertension: Secondary | ICD-10-CM

## 2024-05-01 MED ORDER — ESTRADIOL 0.01 % VA CREA
TOPICAL_CREAM | VAGINAL | 10 refills | Status: AC
  Filled 2024-05-02: qty 42.5, 148d supply, fill #0

## 2024-05-01 MED ORDER — SUCRALFATE 1 G PO TABS
1.0000 g | ORAL_TABLET | Freq: Three times a day (TID) | ORAL | 0 refills | Status: AC
  Filled 2024-05-01: qty 120, 30d supply, fill #0

## 2024-05-01 NOTE — Telephone Encounter (Signed)
 Estradiol - change in pharmacy- remainder of rx sent Pantoprazole - duplicate request- filled 04/24/24 #90 1RF- too soon Requested Prescriptions  Pending Prescriptions Disp Refills   pantoprazole  (PROTONIX ) 20 MG tablet 90 tablet 1    Sig: Take 1 tablet (20 mg total) by mouth daily.     Gastroenterology: Proton Pump Inhibitors Passed - 05/01/2024  2:58 PM      Passed - Valid encounter within last 12 months    Recent Outpatient Visits           4 weeks ago Chest pain in adult   [facility] - A Dept Of [place]. [facility] Vicci Barnie NOVAK, MD   2 months ago Essential hypertension   [place] [facility] [place] - A Dept Of [place]. [facility] Vicci Barnie B, MD   8 months ago Obesity (BMI 30.0-34.9)   [place] [facility] - A Dept Of [place]. [facility] Vicci Barnie NOVAK, MD   1 year ago Annual physical exam   [place] [facility] [place] - A Dept Of [place]. [facility] Vicci Barnie NOVAK, MD   3 years ago Essential hypertension   [place] [facility] [place] - A Dept Of [place]. [facility] Theotis Haze ORN, NP       Future Appointments             In 1 month Jordan, Maude HERO, MD [facility] HeartCare at [facility] A Dept of [employer]. [facility], [facility]             sucralfate  (CARAFATE ) 1 g tablet 120 tablet 0    Sig: Take 1 tablet (1 g total) by mouth 4 (four) times daily -  with meals and at bedtime.     Gastroenterology: Antiacids Passed - 05/01/2024  2:58 PM      Passed - Valid encounter within last 12 months    Recent Outpatient Visits           4 weeks ago Chest pain in adult   [facility] - A Dept Of [place]. [facility] Vicci Barnie NOVAK, MD   2 months ago Essential hypertension   [place] [facility] [place] - A Dept Of [place]. [facility] Vicci Barnie B, MD   8 months ago Obesity (BMI 30.0-34.9)   [place] [facility]  [facility] - A Dept Of [place]. [facility] Vicci Barnie NOVAK, MD   1 year ago Annual physical exam   [place] [facility] [place] - A Dept Of [place]. [facility] Vicci Barnie NOVAK, MD   3 years ago Essential hypertension    Comm [facility] [place] - A Dept Of . [facility] Theotis Haze ORN, NP       Future Appointments             In 1 month Jordan, Maude HERO, MD [facility] at [facility] A Dept of [employer]. [facility], H[facility]             estradiol  (ESTRACE ) 0.01 % CREA vaginal cream 42.5 g 10    Sig: Apply 1 gram vaginally 2 times a week     There is no refill protocol information for this order

## 2024-05-01 NOTE — Telephone Encounter (Signed)
 Requested medication (s) are due for refill today - yes#120  Requested medication (s) are on the active medication list -yes  Future visit scheduled -yes  Last refill: 03/30/24  Notes to clinic: outside provider- sent for review   Requested Prescriptions  Pending Prescriptions Disp Refills   sucralfate  (CARAFATE ) 1 g tablet 120 tablet 0    Sig: Take 1 tablet (1 g total) by mouth 4 (four) times daily -  with meals and at bedtime.     Gastroenterology: Antiacids Passed - 05/01/2024  2:59 PM      Passed - Valid encounter within last 12 months    Recent Outpatient Visits           4 weeks ago Chest pain in adult   [facility] - A Dept Of [place]. [facility] Vicci Barnie NOVAK, MD   2 months ago Essential hypertension   [place] [facility] [place] - A Dept Of [place]. [facility] Vicci Barnie B, MD   8 months ago Obesity (BMI 30.0-34.9)   [facility] - A Dept Of [address]. [facility] Vicci Barnie NOVAK, MD   1 year ago Annual physical exam   [place] [facility] [place] - A Dept Of [place]. [facility] Vicci Barnie NOVAK, MD   3 years ago Essential hypertension   [place] [facility] [place] - A Dept Of [place]. [facility] Theotis Haze ORN, NP       Future Appointments             In 1 month Jordan, Maude HERO, MD [facility] HeartCare at [facility] A Dept of [employer]. [facility], H&V            Signed Prescriptions Disp Refills   estradiol  (ESTRACE ) 0.01 % CREA vaginal cream 42.5 g 10    Sig: Apply 1 gram vaginally 2 times a week     There is no refill protocol information for this order    Refused Prescriptions Disp Refills   pantoprazole  (PROTONIX ) 20 MG tablet 90 tablet 1    Sig: Take 1 tablet (20 mg total) by mouth daily.     Gastroenterology: Proton Pump Inhibitors Passed - 05/01/2024  2:59 PM      Passed - Valid encounter within last 12 months    Recent  Outpatient Visits           4 weeks ago Chest pain in adult   [facility] - A Dept Of [place]. [facility] Vicci Barnie NOVAK, MD   2 months ago Essential hypertension   [place] [facility] [place] - A Dept Of [place]. [facility] Vicci Barnie B, MD   8 months ago Obesity (BMI 30.0-34.9)   [place] Comm [facility] - A Dept Of [place]. [facility] Vicci Barnie NOVAK, MD   1 year ago Annual physical exam   [place] [facility] [place] - A Dept Of [place]. [facility] Vicci Barnie NOVAK, MD   3 years ago Essential hypertension   [place] [facility] [place] - A Dept Of [place]. [facility] Theotis Haze ORN, NP       Future Appointments             In 1 month Jordan, Maude HERO, MD [facility] Heart[facility] at [facility] A Dept of [place]  [place]. [facility], H&V               Requested Prescriptions  Pending Prescriptions Disp Refills   sucralfate  (CARAFATE ) 1 g tablet 120 tablet 0    Sig: Take 1 tablet (1 g total) by mouth 4 (four) times daily -  with meals and at bedtime.     Gastroenterology: Antiacids Passed - 05/01/2024  2:59 PM      Passed - Valid encounter within last 12 months    Recent Outpatient Visits           4 weeks ago Chest pain in adult   [facility] - A Dept Of [place]. [facility] Vicci Barnie NOVAK, MD   2 months ago Essential hypertension   [place] [facility] [place] - A Dept Of [place]. [facility] Vicci Barnie B, MD   8 months ago Obesity (BMI 30.0-34.9)   [place] [facility] - A Dept Of [place]. [facility] Vicci Barnie NOVAK, MD   1 year ago Annual physical exam   [place] [facility] [place] - A Dept Of [place]. [facility] Vicci Barnie NOVAK, MD   3 years ago Essential hypertension   [place] Comm Health [place] - A Dept Of [place]. [facility] Theotis Haze ORN, NP        Future Appointments             In 1 month Jordan, Maude HERO, MD [facility] HeartCare at [facility] A Dept of [employer]. [facility], H&V            Signed Prescriptions Disp Refills   estradiol  (ESTRACE ) 0.01 % CREA vaginal cream 42.5 g 10    Sig: Apply 1 gram vaginally 2 times a week     There is no refill protocol information for this order    Refused Prescriptions Disp Refills   pantoprazole  (PROTONIX ) 20 MG tablet 90 tablet 1    Sig: Take 1 tablet (20 mg total) by mouth daily.     Gastroenterology: Proton Pump Inhibitors Passed - 05/01/2024  2:59 PM      Passed - Valid encounter within last 12 months    Recent Outpatient Visits           4 weeks ago Chest pain in adult   [facility] - A Dept Of [place]. [facility] Vicci Barnie NOVAK, MD   2 months ago Essential hypertension   [place] Comm [facility] [place] - A Dept Of [place]. [facility] Vicci Barnie B, MD   8 months ago Obesity (BMI 30.0-34.9)   [place] Comm [facility] - A Dept Of [place]. [facility] Vicci Barnie NOVAK, MD   1 year ago Annual physical exam   [place] [facility] [place] - A Dept Of [place]. [facility] Vicci Barnie NOVAK, MD   3 years ago Essential hypertension   [place] Comm Health [place] - A Dept Of . [facility] Theotis Haze ORN, NP       Future Appointments             In 1 month Jordan, Maude HERO, MD [facility] HeartCare at [facility] A Dept of [employer]. [facility], H&V

## 2024-05-02 ENCOUNTER — Other Ambulatory Visit: Payer: Self-pay

## 2024-05-03 ENCOUNTER — Inpatient Hospital Stay: Admission: RE | Admit: 2024-05-03 | Payer: Self-pay | Source: Ambulatory Visit

## 2024-05-08 ENCOUNTER — Other Ambulatory Visit: Payer: Self-pay

## 2024-05-08 ENCOUNTER — Other Ambulatory Visit (HOSPITAL_COMMUNITY): Payer: Self-pay

## 2024-05-08 MED ORDER — ATORVASTATIN CALCIUM 40 MG PO TABS
40.0000 mg | ORAL_TABLET | Freq: Every day | ORAL | 3 refills | Status: AC
  Filled 2024-05-08: qty 30, 30d supply, fill #0

## 2024-05-08 NOTE — Progress Notes (Signed)
 Sent refill for Lipitor. Patient will be purchasing asa 81mg  when she picks up cholesterol meds later today. She is aware of the pending results from Radiology. All questions, if any, were answered with interpretor [id number]. Patient voiced understanding.

## 2024-05-08 NOTE — Progress Notes (Signed)
 Spoke with pt with interpretor [id number], patient states she does feel those extra beat, does exercise and take meds as prescribed. No chest pain. Patient assures she feels well.

## 2024-05-09 ENCOUNTER — Other Ambulatory Visit (HOSPITAL_COMMUNITY): Payer: Self-pay

## 2024-05-22 ENCOUNTER — Ambulatory Visit: Payer: Self-pay | Admitting: Internal Medicine

## 2024-05-22 ENCOUNTER — Telehealth: Payer: Self-pay | Admitting: Internal Medicine

## 2024-05-22 NOTE — Telephone Encounter (Signed)
 Pt confirmed 06/27/2024 appt per Jackolyn PARAS.

## 2024-06-27 ENCOUNTER — Ambulatory Visit: Payer: Self-pay | Admitting: Cardiology

## 2024-06-27 ENCOUNTER — Ambulatory Visit: Payer: Self-pay | Admitting: Physician Assistant

## 2024-07-03 ENCOUNTER — Ambulatory Visit: Payer: Self-pay | Admitting: Internal Medicine

## 2024-07-13 ENCOUNTER — Ambulatory Visit: Payer: Self-pay | Admitting: Cardiology
